# Patient Record
Sex: Female | Born: 1940 | Race: White | Hispanic: No | Smoking: Current every day smoker
Health system: Southern US, Community
[De-identification: ages and names within clinical notes are randomized; demographics above are authoritative.]

## PROBLEM LIST (undated history)

## (undated) DIAGNOSIS — C4490 Unspecified malignant neoplasm of skin, unspecified: Secondary | ICD-10-CM

## (undated) DIAGNOSIS — M1990 Unspecified osteoarthritis, unspecified site: Secondary | ICD-10-CM

## (undated) DIAGNOSIS — E119 Type 2 diabetes mellitus without complications: Secondary | ICD-10-CM

## (undated) DIAGNOSIS — F32A Depression, unspecified: Secondary | ICD-10-CM

## (undated) DIAGNOSIS — I2510 Atherosclerotic heart disease of native coronary artery without angina pectoris: Secondary | ICD-10-CM

## (undated) DIAGNOSIS — F329 Major depressive disorder, single episode, unspecified: Secondary | ICD-10-CM

## (undated) DIAGNOSIS — K635 Polyp of colon: Secondary | ICD-10-CM

## (undated) DIAGNOSIS — J189 Pneumonia, unspecified organism: Secondary | ICD-10-CM

## (undated) DIAGNOSIS — I639 Cerebral infarction, unspecified: Secondary | ICD-10-CM

## (undated) DIAGNOSIS — I10 Essential (primary) hypertension: Secondary | ICD-10-CM

## (undated) DIAGNOSIS — E785 Hyperlipidemia, unspecified: Secondary | ICD-10-CM

## (undated) HISTORY — DX: Hyperlipidemia, unspecified: E78.5

## (undated) HISTORY — PX: CORONARY ANGIOPLASTY WITH STENT PLACEMENT: SHX49

## (undated) HISTORY — PX: TONSILLECTOMY AND ADENOIDECTOMY: SUR1326

## (undated) HISTORY — PX: APPENDECTOMY: SHX54

## (undated) HISTORY — DX: Polyp of colon: K63.5

## (undated) HISTORY — DX: Depression, unspecified: F32.A

## (undated) HISTORY — DX: Unspecified osteoarthritis, unspecified site: M19.90

## (undated) HISTORY — PX: CATARACT EXTRACTION: SUR2

## (undated) HISTORY — DX: Major depressive disorder, single episode, unspecified: F32.9

## (undated) HISTORY — DX: Atherosclerotic heart disease of native coronary artery without angina pectoris: I25.10

---

## 1975-04-09 HISTORY — PX: TUBAL LIGATION: SHX77

## 1996-06-17 ENCOUNTER — Encounter: Payer: Self-pay | Admitting: Cardiovascular Disease

## 1997-11-16 ENCOUNTER — Inpatient Hospital Stay (HOSPITAL_COMMUNITY): Admission: EM | Admit: 1997-11-16 | Discharge: 1997-11-19 | Payer: Self-pay | Admitting: Emergency Medicine

## 1998-11-15 ENCOUNTER — Inpatient Hospital Stay (HOSPITAL_COMMUNITY): Admission: EM | Admit: 1998-11-15 | Discharge: 1998-11-16 | Payer: Self-pay | Admitting: Cardiovascular Disease

## 1998-11-15 ENCOUNTER — Encounter: Payer: Self-pay | Admitting: Cardiovascular Disease

## 1999-06-08 ENCOUNTER — Other Ambulatory Visit: Admission: RE | Admit: 1999-06-08 | Discharge: 1999-06-08 | Payer: Self-pay | Admitting: Obstetrics and Gynecology

## 2000-06-06 ENCOUNTER — Emergency Department (HOSPITAL_COMMUNITY): Admission: EM | Admit: 2000-06-06 | Discharge: 2000-06-07 | Payer: Self-pay | Admitting: Emergency Medicine

## 2000-06-07 ENCOUNTER — Encounter: Payer: Self-pay | Admitting: Emergency Medicine

## 2003-01-20 ENCOUNTER — Encounter: Payer: Self-pay | Admitting: Internal Medicine

## 2003-02-09 ENCOUNTER — Inpatient Hospital Stay (HOSPITAL_COMMUNITY): Admission: EM | Admit: 2003-02-09 | Discharge: 2003-02-11 | Payer: Self-pay | Admitting: Emergency Medicine

## 2003-08-02 ENCOUNTER — Emergency Department (HOSPITAL_COMMUNITY): Admission: EM | Admit: 2003-08-02 | Discharge: 2003-08-02 | Payer: Self-pay | Admitting: Emergency Medicine

## 2005-05-25 ENCOUNTER — Emergency Department (HOSPITAL_COMMUNITY): Admission: EM | Admit: 2005-05-25 | Discharge: 2005-05-25 | Payer: Self-pay | Admitting: Family Medicine

## 2006-09-24 ENCOUNTER — Ambulatory Visit: Payer: Self-pay | Admitting: Internal Medicine

## 2006-10-30 ENCOUNTER — Ambulatory Visit: Payer: Self-pay | Admitting: Internal Medicine

## 2006-11-06 ENCOUNTER — Ambulatory Visit: Payer: Self-pay | Admitting: Internal Medicine

## 2007-08-01 DIAGNOSIS — E785 Hyperlipidemia, unspecified: Secondary | ICD-10-CM

## 2007-08-01 DIAGNOSIS — D126 Benign neoplasm of colon, unspecified: Secondary | ICD-10-CM | POA: Insufficient documentation

## 2007-08-01 DIAGNOSIS — M1990 Unspecified osteoarthritis, unspecified site: Secondary | ICD-10-CM | POA: Insufficient documentation

## 2007-08-01 DIAGNOSIS — K219 Gastro-esophageal reflux disease without esophagitis: Secondary | ICD-10-CM

## 2007-08-01 DIAGNOSIS — F329 Major depressive disorder, single episode, unspecified: Secondary | ICD-10-CM

## 2007-08-01 DIAGNOSIS — I219 Acute myocardial infarction, unspecified: Secondary | ICD-10-CM | POA: Insufficient documentation

## 2007-08-01 DIAGNOSIS — E11649 Type 2 diabetes mellitus with hypoglycemia without coma: Secondary | ICD-10-CM

## 2008-01-23 ENCOUNTER — Emergency Department (HOSPITAL_COMMUNITY): Admission: EM | Admit: 2008-01-23 | Discharge: 2008-01-23 | Payer: Self-pay | Admitting: Emergency Medicine

## 2009-01-14 ENCOUNTER — Emergency Department (HOSPITAL_COMMUNITY): Admission: EM | Admit: 2009-01-14 | Discharge: 2009-01-14 | Payer: Self-pay | Admitting: Emergency Medicine

## 2009-01-28 ENCOUNTER — Emergency Department (HOSPITAL_COMMUNITY): Admission: EM | Admit: 2009-01-28 | Discharge: 2009-01-28 | Payer: Self-pay | Admitting: Family Medicine

## 2009-04-08 HISTORY — PX: FEMUR FRACTURE SURGERY: SHX633

## 2009-04-16 ENCOUNTER — Emergency Department (HOSPITAL_BASED_OUTPATIENT_CLINIC_OR_DEPARTMENT_OTHER): Admission: EM | Admit: 2009-04-16 | Discharge: 2009-04-16 | Payer: Self-pay | Admitting: Emergency Medicine

## 2009-10-21 ENCOUNTER — Emergency Department (HOSPITAL_COMMUNITY): Admission: EM | Admit: 2009-10-21 | Discharge: 2009-10-21 | Payer: Self-pay | Admitting: Emergency Medicine

## 2009-11-23 ENCOUNTER — Emergency Department (HOSPITAL_COMMUNITY): Admission: EM | Admit: 2009-11-23 | Discharge: 2009-11-23 | Payer: Self-pay | Admitting: Family Medicine

## 2009-12-03 ENCOUNTER — Inpatient Hospital Stay (HOSPITAL_COMMUNITY): Admission: EM | Admit: 2009-12-03 | Discharge: 2009-12-05 | Payer: Self-pay | Admitting: Emergency Medicine

## 2010-04-08 DIAGNOSIS — C4490 Unspecified malignant neoplasm of skin, unspecified: Secondary | ICD-10-CM

## 2010-04-08 HISTORY — DX: Unspecified malignant neoplasm of skin, unspecified: C44.90

## 2010-06-21 LAB — CBC
HCT: 34.8 % — ABNORMAL LOW (ref 36.0–46.0)
MCH: 30.4 pg (ref 26.0–34.0)
MCHC: 33.9 g/dL (ref 30.0–36.0)
MCV: 87.8 fL (ref 78.0–100.0)
Platelets: 210 10*3/uL (ref 150–400)
Platelets: 265 10*3/uL (ref 150–400)
RBC: 3.89 MIL/uL (ref 3.87–5.11)
RBC: 4.47 MIL/uL (ref 3.87–5.11)
RDW: 13.8 % (ref 11.5–15.5)
RDW: 13.9 % (ref 11.5–15.5)
WBC: 10.2 10*3/uL (ref 4.0–10.5)
WBC: 11.4 10*3/uL — ABNORMAL HIGH (ref 4.0–10.5)

## 2010-06-21 LAB — GLUCOSE, CAPILLARY
Glucose-Capillary: 141 mg/dL — ABNORMAL HIGH (ref 70–99)
Glucose-Capillary: 198 mg/dL — ABNORMAL HIGH (ref 70–99)
Glucose-Capillary: 208 mg/dL — ABNORMAL HIGH (ref 70–99)
Glucose-Capillary: 221 mg/dL — ABNORMAL HIGH (ref 70–99)

## 2010-06-21 LAB — URINE CULTURE: Culture: NO GROWTH

## 2010-06-21 LAB — BASIC METABOLIC PANEL
BUN: 6 mg/dL (ref 6–23)
CO2: 23 mEq/L (ref 19–32)
Calcium: 8.3 mg/dL — ABNORMAL LOW (ref 8.4–10.5)
Calcium: 8.6 mg/dL (ref 8.4–10.5)
Chloride: 102 mEq/L (ref 96–112)
Chloride: 106 mEq/L (ref 96–112)
Creatinine, Ser: 0.57 mg/dL (ref 0.4–1.2)
GFR calc Af Amer: >60 mL/min (ref >60)
GFR calc non Af Amer: >60 mL/min (ref >60)
Sodium: 137 mEq/L (ref 135–145)

## 2010-06-21 LAB — APTT: aPTT: 28 seconds (ref 24–37)

## 2010-06-21 LAB — URINALYSIS, ROUTINE W REFLEX MICROSCOPIC
Glucose, UA: NEGATIVE mg/dL
Nitrite: NEGATIVE
Specific Gravity, Urine: 1.01 (ref 1.005–1.030)
pH: 5.5 (ref 5.0–8.0)

## 2010-06-21 LAB — POCT I-STAT, CHEM 8
Creatinine, Ser: 0.8 mg/dL (ref 0.4–1.2)
HCT: 40 % (ref 36.0–46.0)
Hemoglobin: 13.6 g/dL (ref 12.0–15.0)
Potassium: 3.6 mEq/L (ref 3.5–5.1)

## 2010-06-21 LAB — CULTURE, ROUTINE-ABSCESS: Gram Stain: NONE SEEN

## 2010-06-21 LAB — ABO/RH: ABO/RH(D): O POS

## 2010-06-21 LAB — TYPE AND SCREEN

## 2010-06-23 LAB — URINALYSIS, ROUTINE W REFLEX MICROSCOPIC
Glucose, UA: NEGATIVE mg/dL
Hgb urine dipstick: NEGATIVE
Ketones, ur: NEGATIVE mg/dL
Protein, ur: NEGATIVE mg/dL

## 2010-06-23 LAB — COMPREHENSIVE METABOLIC PANEL
ALT: 15 U/L (ref 0–35)
AST: 17 U/L (ref 0–37)
CO2: 24 mEq/L (ref 19–32)
Chloride: 103 mEq/L (ref 96–112)
GFR calc Af Amer: 58 mL/min — ABNORMAL LOW (ref >60)
GFR calc non Af Amer: 48 mL/min — ABNORMAL LOW (ref >60)
Potassium: 3.9 mEq/L (ref 3.5–5.1)
Sodium: 134 mEq/L — ABNORMAL LOW (ref 135–145)
Total Bilirubin: 0.4 mg/dL (ref 0.3–1.2)

## 2010-06-23 LAB — URINE MICROSCOPIC-ADD ON

## 2010-06-23 LAB — DIFFERENTIAL
Basophils Absolute: 0.1 10*3/uL (ref 0.0–0.1)
Eosinophils Absolute: 0 10*3/uL (ref 0.0–0.7)
Eosinophils Relative: 1 % (ref 0–5)

## 2010-06-23 LAB — CBC
Hemoglobin: 12.8 g/dL (ref 12.0–15.0)
RBC: 4.02 MIL/uL (ref 3.87–5.11)

## 2010-06-23 LAB — POCT CARDIAC MARKERS

## 2010-08-21 NOTE — Assessment & Plan Note (Signed)
[facility]                         GASTROENTEROLOGY OFFICE NOTE   Katherine Cole, Katherine Cole                       MRN:          [id number]  DATE:09/24/2006                            DOB:          October 26, 1940    REFERRING PHYSICIAN:  Larina Earthly, M.D.   REASON FOR CONSULTATION:  Surveillance colonoscopy.   HISTORY:  This is a 69 year old female with multiple medical problems  including coronary artery disease status post myocardial infarction,  status post coronary artery stent placement on Plavix and aspirin,  diabetes mellitus, hyperlipidemia, osteoarthritis, gastroesophageal  reflux disease, depression, and history of adenomatous colon polyps.  She also has a family history of colon cancer in her brother diagnosed  at age 52.  The patient last underwent colonoscopy January 20, 2003.  She was found to have several colon polyps which were removed.  These  were both adenomatous and hyperplastic.  Her preparation was fair.  Followup in 3 years recommended.  From a GI standpoint, she is doing  well.  For her reflux disease, she is taking Nexium 40 mg daily.  No  heartburn or dysphagia.  She has been on Plavix for her coronary artery  stents which were placed remotely.  She has come off in the past for  procedures.  She denies abdominal pain, change in bowel habits, or  rectal bleeding.   ALLERGIES:  CODEINE.   CURRENT MEDICATIONS:  1. Aspirin 325 mg daily.  2. Toprol XL 50 mg daily.  3. Lipitor 80 mg daily.  4. Glucophage 1000 mg b.i.d.  5. Avandia 8 mg daily.  6. Plavix 75 mg daily.  7. Nexium 40 mg daily.   PAST MEDICAL HISTORY:  As above.   PAST SURGICAL HISTORY:  Appendectomy.   FAMILY HISTORY:  Brother with colon cancer at age 10; sister with colon  cancer at age 74.   SOCIAL HISTORY:  Married with 3 children, lives with her husband,  retired, smokes, does not use alcohol.   REVIEW OF SYSTEMS:  Per diagnostic evaluation.   PHYSICAL  EXAMINATION:  GENERAL:  Well-appearing female in no acute  distress.  She is alert and oriented.  VITAL SIGNS:  Blood pressure 122/72.  Heart rate is 64 and regular.  Weight is 111 pounds.  She is 5 feet 1 inch in height.  HEENT:  Sclerae anicteric.  Conjunctivae are pink.  Oral mucosa intact.  No adenopathy.  LUNGS:  Clear.  HEART:  Regular.  ABDOMEN:  Soft without tenderness, mass, or hernia.  EXTREMITIES:  Without edema.   IMPRESSION:  1. Personal history of adenomatous colon polyps due for surveillance.  2. Strong family history of colon cancer in two first degree      relatives.  3. Coronary artery disease on Plavix and aspirin.  4. Diabetes mellitus on oral agents.  5. Other general medical problems as described above.   RECOMMENDATIONS:  1. Colonoscopy with polypectomy if necessary.  The nature of the      procedure as well as the risks, benefits, and alternatives have      been reviewed.  She understood and  agreed to proceed.  2. Hold Plavix for 5 days prior to the exam but remain on aspirin.      The pros and cons of holding Plavix for a short period of time were      discussed with the patient.  3. Hold oral hypoglycemic agents the day of her exam.  4. Continue Nexium for reflux disease.     Wilhemina Bonito. Marina Goodell, MD  Electronically Signed    JNP/MedQ  DD: 09/24/2006  DT: 09/25/2006  Job #: [id number]   cc:   Larina Earthly, M.D.

## 2010-08-24 NOTE — Discharge Summary (Signed)
NAME:  Katherine Cole, Katherine Cole                          ACCOUNT NO.:  [id number]   MEDICAL RECORD NO.:  [id number]                   PATIENT TYPE:  INP   LOCATION:  6522                                 FACILITY:  [facility]   PHYSICIAN:  Lyn Records, M.D.                DATE OF BIRTH:  [birth year]   DATE OF ADMISSION:  02/09/2003  DATE OF DISCHARGE:  02/11/2003                                 DISCHARGE SUMMARY   ADMISSION DIAGNOSES:  1. Chest pain, rule out myocardial infarction.  2. Hypertension.  3. Hyperlipidemia.  4. Non-insulin dependent diabetes.  5. Gastroesophageal reflux disease.   DISCHARGE DIAGNOSES:  1. Chest pain, negative for myocardial infarction.  2. Coronary artery disease status post PCI and Cypher stent placement to the     circumflex with additional mild to moderate left anterior descending     disease and a totally occluded right coronary artery.  3. Hypertension, controlled.  4. Hyperlipidemia, uncontrolled.  5. Gastroesophageal reflux disease.  6. Non-insulin dependent diabetes, stable.   PROCEDURES:  Cardiac catheterization on February 10, 2003.   COMPLICATIONS:  None.   DISCHARGE STATUS:  Stable.  Improved.   HISTORY OF PRESENT ILLNESS:  Please see complete H&P for details but in  short this is a 70 year old female with known CAD.  She is status post MI  from 1997 with stent placement to coronary artery.  She was admitted on  February 09, 2003, after complaining of chest discomfort.   PHYSICAL EXAMINATION ON ADMISSION:  Please see complete H&P.  The physical  exam essentially benign.   Admission EKG showed a sinus bradycardia with a rate of 59.  T-wave  inversion in aVL only.  No ischemic changes noted.   Initial cardiac enzymes/cardiac markers in the emergency room were negative  x3 sets.  The rest of her admission labs including CBC, CMP, and coag  studies were normal.   HOSPITAL COURSE:  The patient was admitted to rule out MI with serial  cardiac  enzymes.  She was placed on nitroglycerin paste topically and  started on subcu. Lovenox, enteric-coated aspirin.  CT chest and abdomen  were also ordered to rule out PE and aortic dissection.   On day two of admission, her discomfort had completely resolved with no  recurrence.  Serial cardiac enzymes were all negative.  Repeat EKGs were  normal without ischemic changes.  CT scans were negative for PE and aortic  dissection.  She was planned for a cardiac catheterization to redefine  coronary anatomy.   On February 10, 2003, she was taken to the Cardiac Cath Lab by Dr. Verdis Prime.  Results showed normal LV function.  Left main was okay.  LAD had  diffuse luminal disease.  Patent stent.  Circumflex had an 85-90% lesion in  the proximal to mid section.  RCA was 100% occluded distally.  There was  also significant  diffuse disease proximally and in the mid section.   She underwent successful PCI and Cypher stent placement to the circumflex.  She was started on Plavix and Angiomax.   Cardiac enzymes post-procedure were all normal.  Lipid panel showed a total  of 209, triglycerides 193, HDL 37, LDL 33.  She was already on 40 mg of  Lipitor as an outpatient.   Groin sheath was pulled without complications.   On February 11, 2003, she still was without complaints of chest discomfort.  Her exam was benign.  Vital signs were stable.  Right groin cath site was  without bruising, hematoma or bruit.  She was discharged home without  further incident.   DISCHARGE MEDICATIONS:  1. Glucophage 1000 mg b.i.d.  She was instructed not to resume this until     Sunday, February 13, 2003.  2. Glipizide 5 mg daily.  3. Toprol XL 50 mg daily.  4. Lipitor 80 mg daily.  5. Plavix 75 mg daily.  6. Prilosec OTC 20 mg daily.   Prescriptions for Plavix and new dose of Lipitor were both written.   She is not to drive or have sexual activity today.  She is not to undergo  any strenuous activity, ie. lifting  more than five pounds, bending, stooping  or straining for the next two days.  She may resume her normal activities  after that.   She is to maintain a low-salt, low-fat, low-cholesterol diet as well as her  diabetic diet restrictions.   She may shower and gently wash her right groin cath site with warm soap and  water.  She was instructed not to soak in a bathtub for the next two days.   If she has any pain, swelling or bruising, or any problems, she is to  contact Dr. Michaelle Copas office.   She has a follow up appointment with Dr. Katrinka Blazing on Friday, February 25, 2003,  at 11 a.m.   Social Worker will be assisting her in obtaining her medications, in  particular her Plavix.      Adrian Saran, N.P.                        Lyn Records, M.D.    HB/MEDQ  D:  02/11/2003  T:  02/12/2003  Job:  [id number]

## 2010-08-24 NOTE — H&P (Signed)
NAME:  [name], [name]                          ACCOUNT NO.:  [id number]   MEDICAL RECORD NO.:  [id number]                   PATIENT TYPE:  OBV   LOCATION:  1831                                 FACILITY:  [facility]   PHYSICIAN:  Quita Skye. Waldon Reining, MD             DATE OF BIRTH:  December 21, 1940   DATE OF ADMISSION:  02/09/2003  DATE OF DISCHARGE:                                HISTORY & PHYSICAL   HISTORY OF PRESENT ILLNESS:  Katherine Cole is a 70 year old white woman who  is admitted to [facility] for further evaluation of chest pain.  The patient has a history of coronary artery disease, which dates back to  2.  The details of her interventions are not known as her chart is not  yet available.  I any case she reports that a stent was placed at that time.  Soon thereafter she returned for an additional stent.  Her most recent  cardiac catheterization was in 1999.  She is unaware of those results.  Her  course has been uncomplicated since then.   The patient presented to the emergency department after experiencing chest  pain, which began while she was driving this afternoon.  The chest pain is  described as a lower substernal pressure and it radiated to left shoulder.  It was associated with dyspnea, diaphoresis and nausea.  It appeared not to  be related to position, activity or meals.  It seemed to worsen somewhat  with deep inspiration.  The pain continued throughout the afternoon and  ultimately prompted her to summon EMS.  She took two nitroglycerin tablets  before EMS arrived and additional nitroglycerin was administered by EMS.  This caused the discomfort to improve somewhat, but it did not completely  resolve.  Her chest discomfort is largely resolved at this time.  It is now  10 hours since heart rate chest discomfort began.  There are no other  exacerbating or ameliorating factors.   The patient has no history of congestive heart failure or arrhythmia.  The  patient  has a history of hypertension,  hyperlipidemia and noninsulin-  dependent diabetes mellitus.  The patient continues to smoke one pack of  cigarettes per day.  There is a strong family history of coronary artery  disease; her mother suffered from coronary artery disease in her 41s.   The patient's only other medical problems are hiatal hernia and  gastroesophageal reflux.  Two weeks ago she states she underwent an  esophageal dilatation.   PAST SURGICAL HISTORY:  Previous operations include cholecystectomy and  tonsillectomy.   ACCIDENTS AND INJURIES:  Significant injuries; none.   ALLERGIES:  The patient reports that she is allergic to MORPHINE and  CODEINE.   MEDICATIONS:  Current medications (doses unknown) include Prevacid, Lipitor,  Toprol XL, Glucophage, glipizide, and aspirin.   SOCIAL HISTORY:  The patient does not work.  She lives with her  husband.   REVIEW OF SYSTEMS:  Review of systems reveals no new problems related to her  head, eyes, ears, nose, mouth, throat, lungs, gastrointestinal system,  genitourinary system, or extremities.  There is no history of neurologic or  psychiatric disorder.  There is no history of fever, chills or weight loss.   PHYSICAL EXAMINATION:  VITAL SIGNS:  Blood pressure 119/64, pulse 57 and  regular, respirations 28 and temperature 98.4.  GENERAL APPEARANCE:  The patient is n older white woman in no discomfort.  She is alert, oriented appropriate and responsive.  HEENT:  Head, eyes, nose and mouth are normal.  NECK:  The neck is without thyromegaly or adenopathy.  Carotid pulses are  palpable bilaterally and without bruits.  HEART:  Cardiac examination reveals a normal S1 and S2.  There is no S3, S4,  murmur, rub, or click.  Cardiac rhythm is negative.  CHEST:  No chest wall tenderness is noted.  LUNGS:  The lungs are clear.  ABDOMEN:  The abdomen is soft and nontender.  There is no mass,  hepatosplenomegaly, bruit, distention, rebound, or  rigidity.  Bowel sounds  are normal.  BREASTS, PELVIC AND RECTAL:  Breast, pelvic and rectal examinations are not  performed as they are not pertinent to the reason for acute care  hospitalization.  EXTREMITIES:  The extremities are without edema, deviation or deformity.  Radial and dorsalis pedis pulses are palpable bilaterally.  NEUROLOGIC:  Brief screening neurological examination demonstrates no focal  neurologic findings.   LABORATORY DATA:  The electrocardiogram is normal.  The chest radiograph  report is pending at the time of this dictation.  Cardiac markers reveal a  CK MB of less than 1.0, myoglobin 27.0 and troponin less than 0.05.  The  second set reveals a CK MB of 1.5, myoglobin 34.1 and troponin less than  0.05.  The third set revealed a CK MB of less than 1.0 with a myoglobin of  36.5 and troponin of less than 0.05.  Potassium 3.7, BUN 13 and creatinine  0.8.  The remaining studies are pending at the time of this dictation.   IMPRESSION:  1. Chest pain rule out cardiac ischemia.  2. Coronary artery disease status post myocardial infarction in 1997     resulting in a stent.  An additional stent was placed even thereafter.     The most recent cardiac catheterization was in 1999; the hospital records     are not yet available to detail these events.  3. Hypertension.  4. Hyperlipidemia.  5. Noninsulin-dependent diabetes mellitus.  6. Gastroesophageal reflux and hiatal hernia; two weeks status post     esophageal dilatation.   PLAN:  1. Telemetry.  2. Serial cardiac enzymes.  3. Aspirin.  4. Lovenox.  5. Nitrol Paste.  6. Discontinue smoking.  7. Further evaluation per Dr. Katrinka Blazing.  8. Chest and abdominal CT scan to rule out aortic dissection and pulmonary     embolus.                                                Quita Skye. Waldon Reining, MD    MSC/MEDQ  D:  02/09/2003  T:  02/10/2003  Job:  [id number]

## 2010-12-12 ENCOUNTER — Encounter: Payer: Self-pay | Admitting: Internal Medicine

## 2011-04-09 HISTORY — PX: SKIN CANCER EXCISION: SHX779

## 2011-05-28 ENCOUNTER — Encounter: Payer: Self-pay | Admitting: Internal Medicine

## 2011-06-26 ENCOUNTER — Other Ambulatory Visit: Payer: Self-pay | Admitting: Dermatology

## 2011-07-01 ENCOUNTER — Ambulatory Visit (INDEPENDENT_AMBULATORY_CARE_PROVIDER_SITE_OTHER): Payer: Medicare Other | Admitting: Internal Medicine

## 2011-07-01 ENCOUNTER — Encounter: Payer: Self-pay | Admitting: Internal Medicine

## 2011-07-01 ENCOUNTER — Telehealth: Payer: Self-pay

## 2011-07-01 VITALS — BP 134/62 | HR 72 | Ht 61.0 in | Wt 108.4 lb

## 2011-07-01 DIAGNOSIS — Z86010 Personal history of colonic polyps: Secondary | ICD-10-CM

## 2011-07-01 DIAGNOSIS — K219 Gastro-esophageal reflux disease without esophagitis: Secondary | ICD-10-CM

## 2011-07-01 DIAGNOSIS — I2510 Atherosclerotic heart disease of native coronary artery without angina pectoris: Secondary | ICD-10-CM

## 2011-07-01 DIAGNOSIS — Z800 Family history of malignant neoplasm of digestive organs: Secondary | ICD-10-CM

## 2011-07-01 DIAGNOSIS — E119 Type 2 diabetes mellitus without complications: Secondary | ICD-10-CM

## 2011-07-01 MED ORDER — PEG-KCL-NACL-NASULF-NA ASC-C 100 G PO SOLR: 1.0000 | Freq: Once | ORAL | Status: DC

## 2011-07-01 NOTE — Telephone Encounter (Signed)
Reminded pt to hold her diabetic medication the morning of the procedure and continue taking her aspirin daily

## 2011-07-01 NOTE — Progress Notes (Signed)
HISTORY OF PRESENT ILLNESS:  Katherine Cole is a 71 y.o. female with hypertension, hyperlipidemia, diabetes mellitus, coronary artery disease with remote coronary artery stent placement for which he is on Plavix, adenomatous colon polyps, and a family history of colon cancer. She presents today regarding surveillance colonoscopy. She has a history of adenomatous polyps on index colonoscopy in 2004. Brother with colon cancer at age 58. Last colonoscopy July 2008 with suboptimal preparation. Due for followup. GI review of systems is negative. She denies any interval cardiac problems. She is in between cardiologist. We did hold her Plavix prior to her last exam. She takes aspirin inconsistently. She takes Nexium on demand for heartburn. No dysphagia.  REVIEW OF SYSTEMS:  All non-GI ROS negative except for arthritis  Past Medical History  Diagnosis Date  . Colon polyps     adenomatous  . GERD (gastroesophageal reflux disease)   . CAD (coronary artery disease)   . MI (myocardial infarction)   . Diabetes mellitus   . Hyperlipemia   . Osteoarthritis   . Depression     Past Surgical History  Procedure Date  . Tonsillectomy   . Appendectomy   . Coronary artery bypass graft     x3    Social History Katherine Cole  reports that she has been smoking Cigarettes.  She has never used smokeless tobacco. She reports that she does not drink alcohol or use illicit drugs.  family history includes Colon cancer (age of onset:66) in her brother; Diabetes in her daughter; Lung cancer in her brother and sister; and Stomach cancer in her brother.  Allergies  Allergen Reactions  . Morphine And Related Other (See Comments)    headaches       PHYSICAL EXAMINATION: Vital signs: BP 134/62  Pulse 72  Ht 5\' 1"  (1.549 m)  Wt 108 lb 6.4 oz (49.17 kg)  BMI 20.48 kg/m2  Constitutional: generally well-appearing, no acute distress Psychiatric: alert and oriented x3, cooperative Eyes: extraocular movements  intact, anicteric, conjunctiva pink Mouth: oral pharynx moist, no lesions Neck: supple no lymphadenopathy Cardiovascular: heart regular rate and rhythm, no murmur Lungs: clear to auscultation bilaterally Abdomen: soft, nontender, nondistended, no obvious ascites, no peritoneal signs, normal bowel sounds, no organomegaly Rectal:deferred until colonoscopy Extremities: no lower extremity edema bilaterally Skin: no lesions on visible extremities. Bandages from skin cancer removal Neuro: No focal deficits.   ASSESSMENT:  #1. Personal history of adenomatous polyps #2. Family history of colon cancer in a first-degree relative #3. Coronary artery disease with prior coronary artery stents remotely. On Plavix #4. Diabetes mellitus #5. GERD  PLAN:  #1. Surveillance colonoscopy. Patient is high-risk given her comorbidities and need to address antiplatelet therapy.The nature of the procedure, as well as the risks, benefits, and alternatives were carefully and thoroughly reviewed with the patient. Ample time for discussion and questions allowed. The patient understood, was satisfied, and agreed to proceed. We will hold Plavix 5 days prior to the procedure. She will stay on aspirin daily. Movi prep prescribed. Importance of adequate preparation stressed. She was instructed on the use of the prep #2. Hold diabetic medications the day of the exam #3. PPI on demand to control GERD symptoms

## 2011-07-01 NOTE — Patient Instructions (Addendum)
You have been given a separate informational sheet regarding your tobacco use, the importance of quitting and local resources to help you quit.  You have been scheduled for a colonoscopy with propofol. Please follow written instructions given to you at your visit today.  Please pick up your prep kit at the pharmacy within the next 1-3 days.

## 2011-07-30 ENCOUNTER — Ambulatory Visit (AMBULATORY_SURGERY_CENTER): Payer: Medicare Other | Admitting: Internal Medicine

## 2011-07-30 ENCOUNTER — Encounter: Payer: Self-pay | Admitting: Internal Medicine

## 2011-07-30 VITALS — BP 118/53 | HR 61 | Temp 96.1°F | Resp 17 | Ht 61.0 in | Wt 108.0 lb

## 2011-07-30 DIAGNOSIS — Z800 Family history of malignant neoplasm of digestive organs: Secondary | ICD-10-CM

## 2011-07-30 DIAGNOSIS — Z86010 Personal history of colonic polyps: Secondary | ICD-10-CM

## 2011-07-30 DIAGNOSIS — Z1211 Encounter for screening for malignant neoplasm of colon: Secondary | ICD-10-CM

## 2011-07-30 LAB — GLUCOSE, CAPILLARY
Glucose-Capillary: 130 mg/dL — ABNORMAL HIGH (ref 70–99)
Glucose-Capillary: 162 mg/dL — ABNORMAL HIGH (ref 70–99)

## 2011-07-30 MED ORDER — SODIUM CHLORIDE 0.9 % IV SOLN: 500.0000 mL | INTRAVENOUS | Status: DC

## 2011-07-30 NOTE — Op Note (Signed)
[facility] [address] [place], [place]  [address]  COLONOSCOPY PROCEDURE REPORT  PATIENT:  Katherine Cole, Katherine Cole  MR#:  [id number] BIRTHDATE:  06-25-1940, 70 yrs. old  GENDER:  female ENDOSCOPIST:  Wilhemina Bonito. Eda Keys, MD REF. BY:  Surveillance Program Recall, PROCEDURE DATE:  07/30/2011 PROCEDURE:  Surveillance Colonoscopy ASA CLASS:  Class II INDICATIONS:  history of pre-cancerous (adenomatous) colon polyps, family history of colon cancer, surveillance and high-risk screening ;idxex 2004 w/ TA; f/u 2008 (sibling 30) MEDICATIONS:   MAC sedation, administered by CRNA, propofol (Diprivan) 120 mg IV  DESCRIPTION OF PROCEDURE:   After the risks benefits and alternatives of the procedure were thoroughly explained, informed consent was obtained.  Digital rectal exam was performed and revealed no abnormalities.   The LB CF-[id number] endoscope was introduced through the anus and advanced to the cecum, which was identified by both the appendix and ileocecal valve, without limitations.  The quality of the prep was excellent, using MoviPrep.  The instrument was then slowly withdrawn as the colon was fully examined. <<PROCEDUREIMAGES>>  FINDINGS:  A normal appearing cecum, ileocecal valve, and appendiceal orifice were identified. The ascending, hepatic flexure, transverse, splenic flexure, descending, sigmoid colon, and rectum appeared unremarkable.  No polyps or cancers were seen. Retroflexed views in the rectum revealed no abnormalities.    The time to cecum =  3:33  minutes. The scope was then withdrawn in 7:20  minutes from the cecum and the procedure completed.  COMPLICATIONS:  None  ENDOSCOPIC IMPRESSION: 1) Normal colon 2) No polyps or cancers  RECOMMENDATIONS: 1) Follow up colonoscopy in 5 years  ______________________________ Wilhemina Bonito. Eda Keys, MD  CC:  Chilton Greathouse, MD;  The Patient  n. eSIGNED:   Wilhemina Bonito. Eda Keys at 07/30/2011 08:44 AM  Izora Ribas,  [phone]

## 2011-07-30 NOTE — Progress Notes (Signed)
Patient did not experience any of the following events: a burn prior to discharge; a fall within the facility; wrong site/side/patient/procedure/implant event; or a hospital transfer or hospital admission upon discharge from the facility. (G8907) Patient did not have preoperative order for IV antibiotic SSI prophylaxis. (G8918)  

## 2011-07-30 NOTE — Patient Instructions (Signed)

## 2011-07-31 ENCOUNTER — Telehealth: Payer: Self-pay | Admitting: *Deleted

## 2011-07-31 NOTE — Telephone Encounter (Signed)
  Follow up Call-  Call back number 07/30/2011  Post procedure Call Back phone  # [phone]  Permission to leave phone message Yes     Patient questions:  Do you have a fever, pain , or abdominal swelling? no Pain Score  0 *  Have you tolerated food without any problems? yes  Have you been able to return to your normal activities? yes  Do you have any questions about your discharge instructions: Diet   no Medications  no Follow up visit  no  Do you have questions or concerns about your Care? no  Actions: * If pain score is 4 or above: No action needed, pain <4.

## 2012-02-10 ENCOUNTER — Encounter (HOSPITAL_COMMUNITY): Payer: Self-pay

## 2012-02-10 ENCOUNTER — Emergency Department (HOSPITAL_COMMUNITY): Payer: Medicare Other

## 2012-02-10 ENCOUNTER — Observation Stay (HOSPITAL_COMMUNITY)
Admission: EM | Admit: 2012-02-10 | Discharge: 2012-02-11 | Disposition: A | Discharge status: Home / Self Care | Payer: Medicare Other | Attending: Cardiology | Admitting: Cardiology

## 2012-02-10 DIAGNOSIS — Z9861 Coronary angioplasty status: Secondary | ICD-10-CM | POA: Insufficient documentation

## 2012-02-10 DIAGNOSIS — M1990 Unspecified osteoarthritis, unspecified site: Secondary | ICD-10-CM | POA: Insufficient documentation

## 2012-02-10 DIAGNOSIS — F17200 Nicotine dependence, unspecified, uncomplicated: Secondary | ICD-10-CM | POA: Insufficient documentation

## 2012-02-10 DIAGNOSIS — E785 Hyperlipidemia, unspecified: Secondary | ICD-10-CM | POA: Insufficient documentation

## 2012-02-10 DIAGNOSIS — I2510 Atherosclerotic heart disease of native coronary artery without angina pectoris: Secondary | ICD-10-CM

## 2012-02-10 DIAGNOSIS — R0789 Other chest pain: Principal | ICD-10-CM | POA: Insufficient documentation

## 2012-02-10 DIAGNOSIS — E119 Type 2 diabetes mellitus without complications: Secondary | ICD-10-CM | POA: Insufficient documentation

## 2012-02-10 DIAGNOSIS — Z860100 Personal history of colon polyps, unspecified: Secondary | ICD-10-CM | POA: Insufficient documentation

## 2012-02-10 DIAGNOSIS — F17210 Nicotine dependence, cigarettes, uncomplicated: Secondary | ICD-10-CM

## 2012-02-10 DIAGNOSIS — K219 Gastro-esophageal reflux disease without esophagitis: Secondary | ICD-10-CM | POA: Insufficient documentation

## 2012-02-10 DIAGNOSIS — E11649 Type 2 diabetes mellitus with hypoglycemia without coma: Secondary | ICD-10-CM | POA: Diagnosis present

## 2012-02-10 DIAGNOSIS — I10 Essential (primary) hypertension: Secondary | ICD-10-CM

## 2012-02-10 DIAGNOSIS — R079 Chest pain, unspecified: Secondary | ICD-10-CM

## 2012-02-10 DIAGNOSIS — I252 Old myocardial infarction: Secondary | ICD-10-CM | POA: Insufficient documentation

## 2012-02-10 HISTORY — DX: Essential (primary) hypertension: I10

## 2012-02-10 HISTORY — DX: Type 2 diabetes mellitus without complications: E11.9

## 2012-02-10 LAB — CBC WITH DIFFERENTIAL/PLATELET
Basophils Absolute: 0 10*3/uL (ref 0.0–0.1)
Basophils Relative: 0 % (ref 0–1)
Eosinophils Absolute: 0.1 10*3/uL (ref 0.0–0.7)
MCH: 30.5 pg (ref 26.0–34.0)
MCHC: 34 g/dL (ref 30.0–36.0)
Neutrophils Relative %: 50 % (ref 43–77)
Platelets: 288 10*3/uL (ref 150–400)
RBC: 4.07 MIL/uL (ref 3.87–5.11)

## 2012-02-10 LAB — COMPREHENSIVE METABOLIC PANEL
Albumin: 3.5 g/dL (ref 3.5–5.2)
BUN: 23 mg/dL (ref 6–23)
Chloride: 103 mEq/L (ref 96–112)
Creatinine, Ser: 1.1 mg/dL (ref 0.50–1.10)
GFR calc non Af Amer: 50 mL/min — ABNORMAL LOW (ref >90)
Total Bilirubin: 0.3 mg/dL (ref 0.3–1.2)

## 2012-02-10 LAB — POCT I-STAT TROPONIN I: Troponin i, poc: 0.01 ng/mL (ref 0.00–0.08)

## 2012-02-10 LAB — MAGNESIUM: Magnesium: 1.8 mg/dL (ref 1.5–2.5)

## 2012-02-10 LAB — TROPONIN I: Troponin I: <0.3 ng/mL (ref <0.30)

## 2012-02-10 MED ORDER — GLIMEPIRIDE 1 MG PO TABS
1.0000 mg | ORAL_TABLET | Freq: Every day | ORAL | Status: DC
  Administered 2012-02-11: 1 mg via ORAL
  Filled 2012-02-10 (×2): qty 1

## 2012-02-10 MED ORDER — ACETAMINOPHEN 325 MG PO TABS: 650.0000 mg | ORAL_TABLET | ORAL | Status: DC | PRN

## 2012-02-10 MED ORDER — ALPRAZOLAM 0.25 MG PO TABS: 0.2500 mg | ORAL_TABLET | Freq: Two times a day (BID) | ORAL | Status: DC | PRN

## 2012-02-10 MED ORDER — ONDANSETRON HCL 4 MG/2ML IJ SOLN: 4.0000 mg | Freq: Four times a day (QID) | INTRAMUSCULAR | Status: DC | PRN

## 2012-02-10 MED ORDER — ACETAMINOPHEN 325 MG PO TABS
650.0000 mg | ORAL_TABLET | Freq: Once | ORAL | Status: AC
  Administered 2012-02-10: 650 mg via ORAL
  Filled 2012-02-10: qty 2

## 2012-02-10 MED ORDER — ATORVASTATIN CALCIUM 80 MG PO TABS
80.0000 mg | ORAL_TABLET | Freq: Every day | ORAL | Status: DC
  Administered 2012-02-11: 80 mg via ORAL
  Filled 2012-02-10: qty 1

## 2012-02-10 MED ORDER — ZOLPIDEM TARTRATE 5 MG PO TABS: 5.0000 mg | ORAL_TABLET | Freq: Every evening | ORAL | Status: DC | PRN

## 2012-02-10 MED ORDER — LOSARTAN POTASSIUM 50 MG PO TABS
100.0000 mg | ORAL_TABLET | Freq: Every day | ORAL | Status: DC
  Administered 2012-02-11: 100 mg via ORAL
  Filled 2012-02-10: qty 2

## 2012-02-10 MED ORDER — NITROGLYCERIN 0.4 MG SL SUBL: 0.4000 mg | SUBLINGUAL_TABLET | SUBLINGUAL | Status: DC | PRN

## 2012-02-10 MED ORDER — PANTOPRAZOLE SODIUM 40 MG PO TBEC: 80.0000 mg | DELAYED_RELEASE_TABLET | Freq: Every day | ORAL | Status: DC

## 2012-02-10 MED ORDER — NICOTINE 14 MG/24HR TD PT24
14.0000 mg | MEDICATED_PATCH | Freq: Every day | TRANSDERMAL | Status: DC
  Administered 2012-02-10 – 2012-02-11 (×2): 14 mg via TRANSDERMAL
  Filled 2012-02-10 (×2): qty 1

## 2012-02-10 MED ORDER — ASPIRIN EC 81 MG PO TBEC
81.0000 mg | DELAYED_RELEASE_TABLET | Freq: Every day | ORAL | Status: DC
  Administered 2012-02-11: 81 mg via ORAL
  Filled 2012-02-10: qty 1

## 2012-02-10 MED ORDER — INSULIN ASPART 100 UNIT/ML ~~LOC~~ SOLN
0.0000 [IU] | Freq: Three times a day (TID) | SUBCUTANEOUS | Status: DC
  Administered 2012-02-11: 2 [IU] via SUBCUTANEOUS

## 2012-02-10 MED ORDER — METOPROLOL SUCCINATE ER 50 MG PO TB24
50.0000 mg | ORAL_TABLET | Freq: Every day | ORAL | Status: DC
  Administered 2012-02-11: 50 mg via ORAL
  Filled 2012-02-10: qty 1

## 2012-02-10 NOTE — ED Notes (Signed)
Pt complaining of left CP into shoulder, has had MI in past.  Pain is different.  Pain for 2 days, began again this morning.  Pt having nausea.  Pt took nitro at home pain from 8 to 4.  EMS gave pt nitro x2 and ASA 324mg .  Pt now having 1/10 CP.  Pt had EKG with EMS.  NSR.

## 2012-02-10 NOTE — ED Notes (Addendum)
Pt denies chest pain, dizziness, and nausea currently. Pt mentating appropriately. Pt being prepared to be taken to floor at this time.

## 2012-02-10 NOTE — ED Notes (Addendum)
Report given to floor nurse, Reden, RN. Nurse has no further questions upon report given. Pt being prepared for transport.

## 2012-02-10 NOTE — ED Provider Notes (Signed)
I reviewed and interpreted the EKG during the patient's evaluation in the ED and agree with the resident's interpretation. I saw and evaluated the patient, reviewed the resident's note and I agree with the findings and plan.  Patient presents with chest pain and known history of coronary artery disease. She has not seen a cardiologist for at least a year. Her symptoms are concerning for the possibility of acute coronary syndrome. She will be admitted for further treatment and evaluation to the hospital  Celene Kras, MD 02/10/12 [phone]

## 2012-02-10 NOTE — ED Notes (Signed)
Pt states chest pain 2/10. Pt taken 3 Nitroglycerin total. Pt c/o pain 10/10 in head from Nitro.

## 2012-02-10 NOTE — ED Notes (Signed)
Meal tray ordered 

## 2012-02-10 NOTE — ED Notes (Addendum)
Cardiology MD at bedside.

## 2012-02-10 NOTE — ED Notes (Signed)
Pt denies chest pain currently; pt denies dizziness and nausea; pt denies shortness of breath. Pt c/o headache 6/10 pain, pt states would not like anything for pain states "I will wait a little while."

## 2012-02-10 NOTE — ED Notes (Signed)
Dr. Demetrius Charity Hibma notified of pts BP and MAP.

## 2012-02-10 NOTE — H&P (Signed)
Admission Note   Patient ID: JAQUANA GEIGER MRN: [id number], DOB/AGE: 09-14-1940   Admit date: 02/10/2012 Date of Consult: 02/10/2012  Primary Physician: Hoyle Sauer, MD Primary Cardiologist: Previously Dr. Deborah Chalk  Reason for consult: evaluation/management of chest pain  HPI: Ms. Lueth is a 71yo female w/ PMHx s/f CAD (s/p PCI x 3 stents), DM, HL, HTN, GERD, tobacco abuse who presents to [facility] ED with chest pain.   She last seen by Dr. Deborah Chalk < 2 years. Nuclear stress test at that time was "normal." No limitation in functional status. Able to go on long walks w/o experiencing chest pain, SOB or lightheadedness. She reports sharp, shooting L-sided chest pain beginning last night and into this morning radiating to her left arm lasting 1-2 minutes w/ associated SOB while watching television. No diaphoresis, nausea, PND, LEE, orthopnea, lightheadedness or syncope. Not reminiscent of previous angina. Relieved by NTG SL. No aggravation w/position changes or inspiration. No new cough, fevers, chills, no unilateral leg swelling, redness or tenderness. Reports 1 PPD. No alcohol. No elicit drugs.   In the ED, EKG reveals no evidence of ischemia. Initial trop-I WNL. CBC & BMET WNL. CXR nonacute. VSS.   Problem List: Past Medical History  Diagnosis Date  . Colon polyps     adenomatous  . GERD (gastroesophageal reflux disease)   . CAD (coronary artery disease)   . MI (myocardial infarction)   . Diabetes mellitus   . Hyperlipemia   . Osteoarthritis   . Depression     Past Surgical History  Procedure Date  . Tonsillectomy   . Appendectomy   . Coronary stent placement 1999, 2001    3 stents     Allergies:  Allergies  Allergen Reactions  . Codeine Other (See Comments)    Head ache   . Morphine And Related Other (See Comments)    headaches    Home Medications: Prior to Admission medications   Medication Sig Start Date End Date Taking? Authorizing Provider  acetaminophen  (TYLENOL) 325 MG tablet Take 650 mg by mouth every 6 (six) hours as needed. For pain   Yes Historical Provider, MD  aspirin 325 MG EC tablet Take 325 mg by mouth daily.   Yes Historical Provider, MD  atorvastatin (LIPITOR) 80 MG tablet Take 80 mg by mouth daily.   Yes Historical Provider, MD  clopidogrel (PLAVIX) 75 MG tablet Take 75 mg by mouth daily.   Yes Historical Provider, MD  esomeprazole (NEXIUM) 40 MG capsule Take 40 mg by mouth daily as needed. For acid reflux   Yes Historical Provider, MD  glimepiride (AMARYL) 1 MG tablet Take 1 mg by mouth daily before breakfast.   Yes Historical Provider, MD  ibuprofen (ADVIL,MOTRIN) 200 MG tablet Take 400 mg by mouth every 6 (six) hours as needed. For pain   Yes Historical Provider, MD  losartan (COZAAR) 100 MG tablet Take 100 mg by mouth daily.   Yes Historical Provider, MD  meloxicam (MOBIC) 15 MG tablet Take 15 mg by mouth daily.   Yes Historical Provider, MD  metFORMIN (GLUCOPHAGE) 500 MG tablet Take 1,000 mg by mouth 2 (two) times daily with a meal.   Yes Historical Provider, MD  metoprolol succinate (TOPROL-XL) 50 MG 24 hr tablet Take 50 mg by mouth daily. Take with or immediately following a meal.   Yes Historical Provider, MD  nitroGLYCERIN (NITROSTAT) 0.4 MG SL tablet Place 0.4 mg under the tongue every 5 (five) minutes as needed.   Yes  Historical Provider, MD    Inpatient Medications:     . [COMPLETED] acetaminophen  650 mg Oral Once    (Not in a hospital admission)  Family History  Problem Relation Age of Onset  . Colon cancer Brother 40  . Lung cancer Sister   . Lung cancer Brother   . Stomach cancer Brother   . Diabetes Daughter      History   Social History  . Marital Status: Married    Spouse Name: N/A    Number of Children: 3  . Years of Education: N/A   Occupational History  . Retired    Social History Main Topics  . Smoking status: Current Every Day Smoker -- 0.5 packs/day    Types: Cigarettes  . Smokeless  tobacco: Never Used  . Alcohol Use: No  . Drug Use: No  . Sexually Active: Not on file   Other Topics Concern  . Not on file   Social History Narrative  . No narrative on file     Review of Systems: General: negative for chills, fever, night sweats or weight changes.  Cardiovascular: positive for chest pain and SOB, negative for dyspnea on exertion, edema, orthopnea, palpitations, paroxysmal nocturnal dyspnea  Dermatological: negative for rash Respiratory: negative for cough or wheezing Urologic:  negative for hematuria Abdominal: negative for nausea, vomiting, diarrhea, bright red blood per rectum, melena, or hematemesis Neurologic: negative for visual changes, syncope, or dizziness All other systems reviewed and are otherwise negative except as noted above.  Physical Exam: Blood pressure 112/51, pulse 70, temperature 98.8 F (37.1 C), temperature source Oral, resp. rate 20, SpO2 97.00%.    General: Well developed, well nourished, in no acute distress. Head: Normocephalic, atraumatic, sclera non-icteric, no xanthomas, nares are without discharge.  Neck:  Negative for carotid bruits. JVD not elevated. Lungs: Clear bilaterally to auscultation without wheezes, rales, or rhonchi. Breathing is unlabored. Heart: RRR with S1 S2. No murmurs, rubs, or gallops appreciated. Abdomen: Soft, non-tender, non-distended with normoactive bowel sounds. No hepatomegaly. No rebound/guarding. No obvious abdominal masses. Msk:   Strength and tone appears normal for age. Extremities: No clubbing, cyanosis or edema.  Distal pedal pulses are 2+ and equal bilaterally. Neuro:  Alert and oriented X 3. Moves all extremities spontaneously. Psych:  Responds to questions appropriately with a normal affect.  Labs: Recent Labs  [facility] 02/10/12 1637   WBC 9.1   HGB 12.4   HCT 36.5   MCV 89.7   PLT 288   Lab 02/10/12 1517  NA 138  K 4.2  CL 103  CO2 27  BUN 23  CREATININE 1.10  CALCIUM 9.5  PROT  7.0  BILITOT 0.3  ALKPHOS 78  ALT 11  AST 14  AMYLASE --  LIPASE --  GLUCOSE 132*   Radiology/Studies: Dg Chest Portable 1 View  02/10/2012  *RADIOLOGY REPORT*  Clinical Data: Left-sided chest pain  PORTABLE CHEST - 1 VIEW  Comparison: 12/03/2009  Findings: The heart and pulmonary vascularity are within normal limits.  The lungs are clear bilaterally.  No acute bony abnormality is seen.  IMPRESSION: No acute intrathoracic abnormality.   Original Report Authenticated By: Alcide Clever, M.D.    EKG: NSR, 74 bpm, no ST/T changes  ASSESSMENT:   1. Chest pain, atypical 2. CAD 3. DM 4. HTN 5. HL 6. GERD 7. Tobacco abuse  DISCUSSION/PLAN:  Patient presents to the ED after experiencing sharp, shooting L-sided chest pain radiating to her L-arm and neck lasting for  minutes at a time and relieving. No exertional component or reminiscence to prior MI/angina. It was responsive to NTG SL. No chest pain currently. Objectively, EKG and initial trop reveal no evidence of ischemia. Patient's pain is largely atypical, however given underlying CAD history, cardiac RFs, continued tobacco abuse and lack of follow-up/ischemic eval x 2 years, will plan to observe, rule out overnight. After discussing with MD, plan for outpatient Lexiscan Myoview should she rule out. Continue outpatient medications. Nicoderm to assist w/ tobacco cessation. Switch to low-dose ASA, d/c Plavix. Continue ARB, BB, statin. Hold metformin. Check A1C and lipids. Continue PPI.     Signed, R. Hurman Horn, PA-C 02/10/2012, 6:03 PM  History and all data above reviewed.  Patient examined.  I agree with the findings as above.  Her pain is predominantly atypical. She has no objective evidence of ischemia at the time of this note.  The patient exam reveals COR:RRR  ,  Lungs: Clear  ,  Abd: Positive bowel sounds, no rebound no guarding, Ext No edema  .  All available labs, radiology testing, previous records reviewed. Agree with documented  assessment and plan. Pain has atypical greater than typical features.  We will observe her overnight and cycle enzymes.  If she rules out we can consider out patient stress testing.  Fayrene Fearing Maxamus Colao  8:29 PM  02/10/2012

## 2012-02-10 NOTE — ED Provider Notes (Signed)
History     CSN: [id number]  Arrival date & time 02/10/12  1440   First MD Initiated Contact with Patient 02/10/12 1503      Chief Complaint  Patient presents with  . Chest Pain    (Consider location/radiation/quality/duration/timing/severity/associated sxs/prior treatment) HPI Chest pain for past two days.  Pain described as excruciating, sharp, 10/10 in intensity at maximum. Currently no pain. The location of the patient's problem is left upper chest with radiation to left side of neck.  Onset was two days ago with waxing and waning course since that time.   Modifying factors:  Improved with nitro today. Otherwise, pt not sure what makes it better or worse.  Associated symptoms: denies diaphoresis, nausea. No recent fever.   Past Medical History  Diagnosis Date  . Colon polyps     adenomatous  . GERD (gastroesophageal reflux disease)   . CAD (coronary artery disease)   . MI (myocardial infarction)   . Diabetes mellitus   . Hyperlipemia   . Osteoarthritis   . Depression     Past Surgical History  Procedure Date  . Tonsillectomy   . Appendectomy   . Coronary stent placement 1999, 2001    3 stents    Family History  Problem Relation Age of Onset  . Colon cancer Brother 60  . Lung cancer Sister   . Lung cancer Brother   . Stomach cancer Brother   . Diabetes Daughter     History  Substance Use Topics  . Smoking status: Current Every Day Smoker -- 0.5 packs/day    Types: Cigarettes  . Smokeless tobacco: Never Used  . Alcohol Use: No    OB History    Grav Para Term Preterm Abortions TAB SAB Ect Mult Living                  Review of Systems Negative for respiratory distress, cough. Negative for vomiting, diarrhea.  All other systems reviewed and negative unless noted in HPI.    Allergies  Codeine and Morphine and related  Home Medications   Current Outpatient Rx  Name  Route  Sig  Dispense  Refill  . ACETAMINOPHEN 325 MG PO TABS   Oral   Take  650 mg by mouth every 6 (six) hours as needed.         . ASPIRIN 325 MG PO TBEC   Oral   Take 325 mg by mouth daily.         . ATORVASTATIN CALCIUM 80 MG PO TABS   Oral   Take 80 mg by mouth daily.         Marland Kitchen CLOPIDOGREL BISULFATE 75 MG PO TABS   Oral   Take 75 mg by mouth daily.         Marland Kitchen ESOMEPRAZOLE MAGNESIUM 40 MG PO CPDR   Oral   Take 40 mg by mouth daily before breakfast.         . FLUTICASONE PROPIONATE (NASAL) NA   Nasal   Place 2 sprays into the nose daily.         . GUAIFENESIN ER 600 MG PO TB12   Oral   Take 1,200 mg by mouth as needed.         Marland Kitchen LEVALBUTEROL TARTRATE 45 MCG/ACT IN AERO   Inhalation   Inhale 1-2 puffs into the lungs every 4 (four) hours as needed.         Marland Kitchen LOSARTAN POTASSIUM 100 MG PO TABS  Oral   Take 100 mg by mouth daily.         . MELOXICAM 15 MG PO TABS   Oral   Take 15 mg by mouth daily.         Marland Kitchen METFORMIN HCL ER (MOD) 1000 MG PO TB24   Oral   Take 1,000 mg by mouth 2 (two) times daily with a meal.         . METOPROLOL SUCCINATE ER 50 MG PO TB24   Oral   Take 50 mg by mouth daily. Take with or immediately following a meal.         . NITROGLYCERIN 0.4 MG SL SUBL   Sublingual   Place 0.4 mg under the tongue every 5 (five) minutes as needed.         Marland Kitchen SITAGLIPTIN PHOSPHATE 100 MG PO TABS   Oral   Take 100 mg by mouth daily.         . TRAZODONE HCL 50 MG PO TABS   Oral   Take 50 mg by mouth at bedtime.           BP 116/65  Pulse 75  Temp 98.8 F (37.1 C) (Oral)  Resp 12  SpO2 100%  Physical Exam Nursing note and vitals reviewed.  Constitutional: Pt is alert and appears stated age. Oropharynx: Airway open without erythema or exudate. Respiratory: No respiratory distress. Equal breathing bilaterally. CV: Extremities warm and well perfused. Neuro: No motor nor sensory deficit. Head: Normocephalic and atraumatic. Eyes: No conjunctivitis, no scleral icterus. Neck: Supple, no  mass. Chest: Non-tender. Abdomen: Soft, non-tender MSK: Extremities are atraumatic without deformity. Skin: No rash, no wounds.  ED Course  Procedures (including critical care time)  Labs Reviewed  COMPREHENSIVE METABOLIC PANEL - Abnormal; Notable for the following:    Glucose, Bld 132 (*)     GFR calc non Af Amer 50 (*)     GFR calc Af Amer 58 (*)     All other components within normal limits  MAGNESIUM  POCT I-STAT TROPONIN I  CBC WITH DIFFERENTIAL   Dg Chest Portable 1 View  02/10/2012  *RADIOLOGY REPORT*  Clinical Data: Left-sided chest pain  PORTABLE CHEST - 1 VIEW  Comparison: 12/03/2009  Findings: The heart and pulmonary vascularity are within normal limits.  The lungs are clear bilaterally.  No acute bony abnormality is seen.  IMPRESSION: No acute intrathoracic abnormality.   Original Report Authenticated By: Alcide Clever, M.D.      1. Chest pain       MDM  71 y.o. female here with chest pain.  Pertinent past problems include CAD with stents x3 on plavix, DM, HL, current tobacco use. Although typical chest pain, daughter states MI chest pain was typical as well. Will work up for ACS. Doubt dissection, PE, esophageal rupture.   Medications/interventions:  ASA, nitro given by EMS with resolution. Tylenol for headache.   Data reviewed: EKG ordered and interpreted by me: NSR, no axis deviation, no QRS widening, no significant changes from prior EKG and no ST elevation/depression, widened p wave.  Lab tests ordered and reviewed by me: troponin low, CMP unremarkable, Magnesium ok. CBC unremarkable.  I independently viewed the following imaging studies and reviewed radiology's interpretation as summarized: CXR without acute abnormality.  Course of care: Pt and daughter updated. Pt resting comfortably, NAD. Cardiology consulted.   Medical Decision Making discussed with ED attending Celene Kras, MD        Jeanella Craze  Merlean Pizzini, MD 02/10/12 1919

## 2012-02-10 NOTE — ED Notes (Signed)
Pt denies chest pain; pt denies nausea and dizziness.

## 2012-02-10 NOTE — Consult Note (Deleted)
Cardiology Consult Note   Patient ID: Katherine Cole MRN: [id number], DOB/AGE: 1940-09-28   Admit date: 02/10/2012 Date of Consult: 02/10/2012  Primary Physician: Hoyle Sauer, MD Primary Cardiologist: Previously Dr. Deborah Chalk  Reason for consult: evaluation/management of chest pain  HPI: [name] is a 71yo female w/ PMHx s/f CAD (s/p PCI x 3 stents), DM, HL, HTN, GERD, tobacco abuse who presents to [facility] ED with chest pain.   She last seen by Dr. Deborah Chalk < 2 years. Nuclear stress test at that time was "normal." No limitation in functional status. Able to go on long walks w/o experiencing chest pain, SOB or lightheadedness. She reports sharp, shooting L-sided chest pain beginning last night and into this morning radiating to her left arm lasting 1-2 minutes w/ associated SOB while watching television. No diaphoresis, nausea, PND, LEE, orthopnea, lightheadedness or syncope. Not reminiscent of previous angina. Relieved by NTG SL. No aggravation w/position changes or inspiration. No new cough, fevers, chills, no unilateral leg swelling, redness or tenderness. Reports 1 PPD. No alcohol. No elicit drugs.   In the ED, EKG reveals no evidence of ischemia. Initial trop-I WNL. CBC & BMET WNL. CXR nonacute. VSS.   Problem List: Past Medical History  Diagnosis Date  . Colon polyps     adenomatous  . GERD (gastroesophageal reflux disease)   . CAD (coronary artery disease)   . MI (myocardial infarction)   . Diabetes mellitus   . Hyperlipemia   . Osteoarthritis   . Depression     Past Surgical History  Procedure Date  . Tonsillectomy   . Appendectomy   . Coronary stent placement 1999, 2001    3 stents     Allergies:  Allergies  Allergen Reactions  . Codeine Other (See Comments)    Head ache   . Morphine And Related Other (See Comments)    headaches    Home Medications: Prior to Admission medications   Medication Sig Start Date End Date Taking? Authorizing Provider  acetaminophen  (TYLENOL) 325 MG tablet Take 650 mg by mouth every 6 (six) hours as needed. For pain   Yes Historical Provider, MD  aspirin 325 MG EC tablet Take 325 mg by mouth daily.   Yes Historical Provider, MD  atorvastatin (LIPITOR) 80 MG tablet Take 80 mg by mouth daily.   Yes Historical Provider, MD  clopidogrel (PLAVIX) 75 MG tablet Take 75 mg by mouth daily.   Yes Historical Provider, MD  esomeprazole (NEXIUM) 40 MG capsule Take 40 mg by mouth daily as needed. For acid reflux   Yes Historical Provider, MD  glimepiride (AMARYL) 1 MG tablet Take 1 mg by mouth daily before breakfast.   Yes Historical Provider, MD  ibuprofen (ADVIL,MOTRIN) 200 MG tablet Take 400 mg by mouth every 6 (six) hours as needed. For pain   Yes Historical Provider, MD  losartan (COZAAR) 100 MG tablet Take 100 mg by mouth daily.   Yes Historical Provider, MD  meloxicam (MOBIC) 15 MG tablet Take 15 mg by mouth daily.   Yes Historical Provider, MD  metFORMIN (GLUCOPHAGE) 500 MG tablet Take 1,000 mg by mouth 2 (two) times daily with a meal.   Yes Historical Provider, MD  metoprolol succinate (TOPROL-XL) 50 MG 24 hr tablet Take 50 mg by mouth daily. Take with or immediately following a meal.   Yes Historical Provider, MD  nitroGLYCERIN (NITROSTAT) 0.4 MG SL tablet Place 0.4 mg under the tongue every 5 (five) minutes as needed.  Yes Historical Provider, MD    Inpatient Medications:     . [COMPLETED] acetaminophen  650 mg Oral Once    (Not in a hospital admission)  Family History  Problem Relation Age of Onset  . Colon cancer Brother 66  . Lung cancer Sister   . Lung cancer Brother   . Stomach cancer Brother   . Diabetes Daughter      History   Social History  . Marital Status: Married    Spouse Name: N/A    Number of Children: 3  . Years of Education: N/A   Occupational History  . Retired    Social History Main Topics  . Smoking status: Current Every Day Smoker -- 0.5 packs/day    Types: Cigarettes  . Smokeless  tobacco: Never Used  . Alcohol Use: No  . Drug Use: No  . Sexually Active: Not on file   Other Topics Concern  . Not on file   Social History Narrative  . No narrative on file     Review of Systems: General: negative for chills, fever, night sweats or weight changes.  Cardiovascular: positive for chest pain and SOB, negative for dyspnea on exertion, edema, orthopnea, palpitations, paroxysmal nocturnal dyspnea  Dermatological: negative for rash Respiratory: negative for cough or wheezing Urologic:  negative for hematuria Abdominal: negative for nausea, vomiting, diarrhea, bright red blood per rectum, melena, or hematemesis Neurologic: negative for visual changes, syncope, or dizziness All other systems reviewed and are otherwise negative except as noted above.  Physical Exam: Blood pressure 112/51, pulse 70, temperature 98.8 F (37.1 C), temperature source Oral, resp. rate 20, SpO2 97.00%.    General: Well developed, well nourished, in no acute distress. Head: Normocephalic, atraumatic, sclera non-icteric, no xanthomas, nares are without discharge.  Neck:  Negative for carotid bruits. JVD not elevated. Lungs: Clear bilaterally to auscultation without wheezes, rales, or rhonchi. Breathing is unlabored. Heart: RRR with S1 S2. No murmurs, rubs, or gallops appreciated. Abdomen: Soft, non-tender, non-distended with normoactive bowel sounds. No hepatomegaly. No rebound/guarding. No obvious abdominal masses. Msk:   Strength and tone appears normal for age. Extremities: No clubbing, cyanosis or edema.  Distal pedal pulses are 2+ and equal bilaterally. Neuro:  Alert and oriented X 3. Moves all extremities spontaneously. Psych:  Responds to questions appropriately with a normal affect.  Labs: Recent Labs  [facility] 02/10/12 1637   WBC 9.1   HGB 12.4   HCT 36.5   MCV 89.7   PLT 288   Lab 02/10/12 1517  NA 138  K 4.2  CL 103  CO2 27  BUN 23  CREATININE 1.10  CALCIUM 9.5  PROT  7.0  BILITOT 0.3  ALKPHOS 78  ALT 11  AST 14  AMYLASE --  LIPASE --  GLUCOSE 132*   Radiology/Studies: Dg Chest Portable 1 View  02/10/2012  *RADIOLOGY REPORT*  Clinical Data: Left-sided chest pain  PORTABLE CHEST - 1 VIEW  Comparison: 12/03/2009  Findings: The heart and pulmonary vascularity are within normal limits.  The lungs are clear bilaterally.  No acute bony abnormality is seen.  IMPRESSION: No acute intrathoracic abnormality.   Original Report Authenticated By: Alcide Clever, M.D.    EKG: NSR, 74 bpm, no ST/T changes  ASSESSMENT:   1. Chest pain, atypical 2. CAD 3. DM 4. HTN 5. HL 6. GERD 7. Tobacco abuse  DISCUSSION/PLAN:  Patient presents to the ED after experiencing sharp, shooting L-sided chest pain radiating to her L-arm and neck lasting  for minutes at a time and relieving. No exertional component or reminiscence to prior MI/angina. It was responsive to NTG SL. No chest pain currently. Objectively, EKG and initial trop reveal no evidence of ischemia. Patient's pain is largely atypical, however given underlying CAD history, cardiac RFs, continued tobacco abuse and lack of follow-up/ischemic eval x 2 years, will plan to observe, rule out overnight. After discussing with MD, plan for outpatient Lexiscan Myoview should she rule out. Continue outpatient medications. Nicoderm to assist w/ tobacco cessation. Switch to low-dose ASA, d/c Plavix. Continue ARB, BB, statin. Hold metformin. Check A1C and lipids. Continue PPI.     Signed, R. Hurman Horn, PA-C 02/10/2012, 6:03 PM

## 2012-02-10 NOTE — ED Notes (Signed)
Meal tray delivered.

## 2012-02-11 ENCOUNTER — Encounter (HOSPITAL_COMMUNITY): Payer: Self-pay | Admitting: Cardiology

## 2012-02-11 LAB — BASIC METABOLIC PANEL
Calcium: 9.1 mg/dL (ref 8.4–10.5)
Creatinine, Ser: 1.09 mg/dL (ref 0.50–1.10)
GFR calc non Af Amer: 51 mL/min — ABNORMAL LOW (ref >90)
Sodium: 136 mEq/L (ref 135–145)

## 2012-02-11 LAB — TSH: TSH: 1.229 u[IU]/mL (ref 0.350–4.500)

## 2012-02-11 LAB — LIPID PANEL
Cholesterol: 184 mg/dL (ref 0–200)
VLDL: 57 mg/dL — ABNORMAL HIGH (ref 0–40)

## 2012-02-11 LAB — CBC
MCH: 30 pg (ref 26.0–34.0)
MCHC: 33.9 g/dL (ref 30.0–36.0)
Platelets: 271 10*3/uL (ref 150–400)

## 2012-02-11 LAB — HEMOGLOBIN A1C: Hgb A1c MFr Bld: 8.9 % — ABNORMAL HIGH (ref <5.7)

## 2012-02-11 LAB — GLUCOSE, CAPILLARY: Glucose-Capillary: 167 mg/dL — ABNORMAL HIGH (ref 70–99)

## 2012-02-11 LAB — TROPONIN I: Troponin I: <0.3 ng/mL (ref <0.30)

## 2012-02-11 MED ORDER — ASPIRIN 81 MG PO TBEC: 81.0000 mg | DELAYED_RELEASE_TABLET | Freq: Every day | ORAL | Status: DC

## 2012-02-11 MED ORDER — NICOTINE 14 MG/24HR TD PT24: MEDICATED_PATCH | TRANSDERMAL | Status: DC

## 2012-02-11 NOTE — Discharge Summary (Signed)
Discharge Summary   Patient ID: KAHLA RISDON MRN: [id number], DOB/AGE: 1940/06/02 71 y.o.  Primary MD: Hoyle Sauer, MD Primary Cardiologist: Dr. Antoine Poche Admit date: 02/10/2012 D/C date:     02/11/2012      Primary Discharge Diagnoses:  1. Midsternal chest pain  - No objective evidence of acute cardiac ischemia  - Outpatient lexiscan myoview  2. Tobacco Abuse  Secondary Discharge Diagnoses:  . Colon polyps     adenomatous  . GERD (gastroesophageal reflux disease)   . CAD (coronary artery disease)     s/p PCI x 3 stents  . MI (myocardial infarction)   . Hyperlipemia   . Osteoarthritis   . Depression   . Hypertension   . Diabetes mellitus, type 2     Allergies Allergies  Allergen Reactions  . Codeine Other (See Comments)    Head ache   . Morphine And Related Other (See Comments)    headaches    Diagnostic Studies/Procedures:  None  History of Present Illness: 71 y.o. female w/ the above medical problems who presented to [facility] on 02/10/12 with complaints of chest pain. She reported sharp, shooting L-sided chest pain beginning the night prior to presentation and into the morning with radiation to her left arm. It lasted 1-2 minutes w/ associated SOB and was relieved with SL NTG prompting her to present to ED.  Hospital Course: In the ED, EKG revealed NSR with no acute ST/T changes. CXR was without acute cardiopulmonary abnormalities. Labs were significant for normal troponin and unremarkable CBC/BMET. It was felt her symptoms were atypical for cardiac ischemia, but given her cardiac history and risk factors she was observed overnight. Cardiac enzymes were cycled and remained negative. EKG remained nonischemic.  She was seen and evaluated by Dr. Patty Sermons who felt she was stable for discharge home with plans for further ischemic evaluation with outpatient lexiscan myoview.  Discharge Vitals: Blood pressure 125/60, pulse 71, temperature 98.3 F (36.8  C), temperature source Oral, resp. rate 18, height 5\' 1"  (1.549 m), weight 106 lb 4.2 oz (48.2 kg), SpO2 98.00%.  Labs: Component Value Date   WBC 7.7 02/11/2012   HGB 11.7* 02/11/2012   HCT 34.5* 02/11/2012   MCV 88.5 02/11/2012   PLT 271 02/11/2012    Lab 02/11/12 0253 02/10/12 1517  NA 136 --  K 4.0 --  CL 102 --  CO2 25 --  BUN 24* --  CREATININE 1.09 --  CALCIUM 9.1 --  PROT -- 7.0  BILITOT -- 0.3  ALKPHOS -- 78  ALT -- 11  AST -- 14  GLUCOSE 218* --   Basename 02/11/12 0253 02/10/12 2142  TROPONINI <0.30 <0.30   Component Value Date   CHOL 184 02/11/2012   HDL 48 02/11/2012   LDLCALC 79 02/11/2012   TRIG 287* 02/11/2012     02/10/2012 21:42  Hemoglobin A1C 8.9 (H)     02/10/2012 21:42  TSH 1.229     Discharge Medications     Medication List     As of 02/11/2012 11:04 AM    STOP taking these medications         clopidogrel 75 MG tablet   Commonly known as: PLAVIX      TAKE these medications         acetaminophen 325 MG tablet   Commonly known as: TYLENOL   Take 650 mg by mouth every 6 (six) hours as needed. For pain      aspirin 81  MG EC tablet   Take 1 tablet (81 mg total) by mouth daily.      atorvastatin 80 MG tablet   Commonly known as: LIPITOR   Take 80 mg by mouth daily.      esomeprazole 40 MG capsule   Commonly known as: NEXIUM   Take 40 mg by mouth daily as needed. For acid reflux      glimepiride 1 MG tablet   Commonly known as: AMARYL   Take 1 mg by mouth daily before breakfast.      ibuprofen 200 MG tablet   Commonly known as: ADVIL,MOTRIN   Take 400 mg by mouth every 6 (six) hours as needed. For pain      losartan 100 MG tablet   Commonly known as: COZAAR   Take 100 mg by mouth daily.      meloxicam 15 MG tablet   Commonly known as: MOBIC   Take 15 mg by mouth daily.      metFORMIN 500 MG tablet   Commonly known as: GLUCOPHAGE   Take 1,000 mg by mouth 2 (two) times daily with a meal.      metoprolol succinate 50 MG 24  hr tablet   Commonly known as: TOPROL-XL   Take 50 mg by mouth daily. Take with or immediately following a meal.      nicotine 14 mg/24hr patch   Commonly known as: NICODERM CQ - dosed in mg/24 hours   21mg  patch one daily for 6 weeks (dispense #42), then 14mg  patch one daily for 2 weeks (dispense #14), then 7mg  patch one daily for 2 weeks (dispense #14). Remove old patch before applying new one.      nitroGLYCERIN 0.4 MG SL tablet   Commonly known as: NITROSTAT   Place 0.4 mg under the tongue every 5 (five) minutes as needed.          Disposition   Discharge Orders    Future Appointments: Provider: Department: Dept Phone: Center:   02/18/2012 11:45 AM [facility] 1 ([facility]) [facility] SITE 3 NUCLEAR MED [phone] None   02/25/2012 2:00 PM Rosalio Macadamia, NP [facility] Main Office [place]) [phone] [id number]     Future Orders Please Complete By Expires   Diet - low sodium heart healthy      Increase activity slowly      Discharge instructions      Comments:   * Please stop smoking!  * You have a Stress Test scheduled on 02/18/12 at [facility] in [place]. Your doctor has ordered this test to get a better idea of how your heart works.  Please arrive 15 minutes early for paperwork.   Location: [address], [address] [place], [place] [address] [phone]  Instructions: No food/drink after midnight the night before.  No caffeine/decaf products 24 hours before, including medicines such as Excedrin or Goody Powders. Call if there are any questions.  Wear comfortable clothes and shoes.  It is OK to take your morning meds with a sip of water EXCEPT for those types of medicines listed below or otherwise instructed.  Special Medication Instructions: Beta blockers such as metoprolol (Lopressor/Toprol XL), atenolol (Tenormin), carvedilol (Coreg), nebivolol (Bystolic), propranolol (Inderal) should not be taken for 24 hours before  the test. Calcium channel blockers such as diltiazem (Cardizem) or verapmil (Calan) should not be taken for 24 hours before the test. Remove nitroglycerin patches and do not take nitrate preparations such as Imdur/isosorbide the day  of your test. No Persantine/Theophylline or Aggrenox medicines should be used within 24 hours of the test.   What To Expect: The whole test will take several hours. When you arrive in the lab, the technician will inject a small amount of radioactive tracer into your arm through an IV while you are resting quietly. This helps [place] to form pictures of your heart. You will likely only feel a sting from the IV. After a waiting period, resting pictures will be obtained under a big camera. These are the "before" pictures.  Next, you will be prepped for the stress portion of the test. This will include receiving a medicine that helps to dilate blood vessels in your heart to simulate the effect of exercise on your heart. This may cause temporary nausea, flushing, shortness of breath and sometimes chest discomfort or vomiting. This is typically short-lived and usually resolves quickly. Your blood pressure and heart rate will be monitored, and we will be watching your EKG on a computer screen for any changes. During this portion of the test, the radiologist will inject another small amount of radioactive tracer into your IV. After a waiting period, you will undergo a second set of pictures. These are the "after" pictures.  The doctor reading the test will compare the before-and-after images to look for evidence of heart blockages or heart weakness.     Follow-up Information    Follow up with [facility]. (Stress test @ 11:45. Please arrive by 11:30)    Contact information:   [address] [place] [address] [phone]      Follow up with Norma Fredrickson, NP. On 02/25/2012. (2:00)    Contact information:    [facility] [address] [place] [address] [phone]       Follow up with Hoyle Sauer, MD. (As needed)    Contact information:   [address] [facility] [place] [address] [phone]           Outstanding Labs/Studies:  1. Lexiscan Myoview  Duration of Discharge Encounter: Greater than 30 minutes including physician and PA time.  Signed, Chosen Garron PA-C 02/11/2012, 11:04 AM

## 2012-02-11 NOTE — Progress Notes (Signed)
Subjective:  Feels well this am. No more severe chest pain. Still very mild twinges in left upper chest lasting only a few seconds.  Pain very atypical for ischemia. Repeat EKG is normal. Cardiac enzymes negative x2 Telemetry shows NSR.  Objective:  Vital Signs in the last 24 hours: Temp:  [97.6 F (36.4 C)-98.8 F (37.1 C)] 98.3 F (36.8 C) (11/05 0455) Pulse Rate:  [67-78] 71  (11/05 0455) Resp:  [12-20] 18  (11/05 0455) BP: (101-145)/(51-66) 125/60 mmHg (11/05 0455) SpO2:  [97 %-100 %] 98 % (11/05 0455) Weight:  [106 lb 4.2 oz (48.2 kg)] 106 lb 4.2 oz (48.2 kg) (11/04 2055)  Intake/Output from previous day:   Intake/Output from this shift:       . [COMPLETED] acetaminophen  650 mg Oral Once  . aspirin EC  81 mg Oral Daily  . atorvastatin  80 mg Oral Daily  . glimepiride  1 mg Oral QAC breakfast  . insulin aspart  0-9 Units Subcutaneous TID WC  . losartan  100 mg Oral Daily  . metoprolol succinate  50 mg Oral Daily  . nicotine  14 mg Transdermal Daily  . pantoprazole  80 mg Oral Q1200      Physical Exam: The patient appears to be in no distress.  Head and neck exam reveals that the pupils are equal and reactive.  The extraocular movements are full.  There is no scleral icterus.  Mouth and pharynx are benign.  No lymphadenopathy.  No carotid bruits.  The jugular venous pressure is normal.  Thyroid is not enlarged or tender.  Chest reveals few expiratory wheezes.  Heart reveals no abnormal lift or heave.  First and second heart sounds are normal.  There is no murmur gallop rub or click.  The abdomen is soft and nontender.  Bowel sounds are normoactive.  There is no hepatosplenomegaly or mass.  There are no abdominal bruits.  Extremities reveal no phlebitis or edema.  Pedal pulses are good.  There is no cyanosis or clubbing.  Neurologic exam is normal strength and no lateralizing weakness.  No sensory deficits.  Integument reveals no rash  Lab  Results:  Basename 02/11/12 0253 02/10/12 1637  WBC 7.7 9.1  HGB 11.7* 12.4  PLT 271 288    Basename 02/11/12 0253 02/10/12 1517  NA 136 138  K 4.0 4.2  CL 102 103  CO2 25 27  GLUCOSE 218* 132*  BUN 24* 23  CREATININE 1.09 1.10    Basename 02/11/12 0253 02/10/12 2142  TROPONINI <0.30 <0.30   Hepatic Function Panel  Basename 02/10/12 1517  PROT 7.0  ALBUMIN 3.5  AST 14  ALT 11  ALKPHOS 78  BILITOT 0.3  BILIDIR --  IBILI --    Basename 02/11/12 0253  CHOL 184   No results found for this basename: PROTIME in the last 72 hours  Imaging: Dg Chest Portable 1 View  02/10/2012  *RADIOLOGY REPORT*  Clinical Data: Left-sided chest pain  PORTABLE CHEST - 1 VIEW  Comparison: 12/03/2009  Findings: The heart and pulmonary vascularity are within normal limits.  The lungs are clear bilaterally.  No acute bony abnormality is seen.  IMPRESSION: No acute intrathoracic abnormality.   Original Report Authenticated By: Alcide Clever, M.D.     Cardiac Studies:  Assessment/Plan:  Patient Active Hospital Problem List: Atypical chest pain (02/10/2012)   Assessment: No evidence of ischemia or MI   Plan: Outpatient walking lexiscan myoview. DIABETES MELLITUS, TYPE II, CONTROLLED (08/01/2007)  Assessment: Stable   Plan: follow up with Dr. Felipa Eth HYPERLIPIDEMIA (08/01/2007)   Assessment: Stable   Plan: Continue statin GASTROESOPHAGEAL REFLUX DISEASE (08/01/2007)   Assessment: Stable    Plan: Continue nexium Tobacco abuse (02/10/2012)   Assessment: Smokes 1 ppd   Plan: Urged to quit. Hypertension (02/10/2012)   Assessment: BP stable   Plan: Continue home meds.  Plan: discharge today. Outpatient walking lexiscan myoview.  Two years ago unable to do regular stress Myoview and had to be converted to lexiscan.   LOS: 1 day    Cassell Clement 02/11/2012, 7:22 AM

## 2012-02-11 NOTE — Progress Notes (Signed)
Pt discharge instructions and patient education complete. IV site d/c. Site WNL. No s/s of distress. Stress test arranged and explained to patient and daughter. D/C home with family. Katherine Cole

## 2012-02-13 ENCOUNTER — Encounter: Payer: Self-pay | Admitting: Cardiovascular Disease

## 2012-02-18 ENCOUNTER — Ambulatory Visit (HOSPITAL_COMMUNITY): Payer: Medicare Other | Attending: Cardiology | Admitting: Radiology

## 2012-02-18 VITALS — BP 137/67 | HR 60 | Ht 61.0 in | Wt 104.0 lb

## 2012-02-18 DIAGNOSIS — E119 Type 2 diabetes mellitus without complications: Secondary | ICD-10-CM | POA: Insufficient documentation

## 2012-02-18 DIAGNOSIS — E785 Hyperlipidemia, unspecified: Secondary | ICD-10-CM | POA: Insufficient documentation

## 2012-02-18 DIAGNOSIS — R0602 Shortness of breath: Secondary | ICD-10-CM | POA: Insufficient documentation

## 2012-02-18 DIAGNOSIS — R5381 Other malaise: Secondary | ICD-10-CM | POA: Insufficient documentation

## 2012-02-18 DIAGNOSIS — Z8249 Family history of ischemic heart disease and other diseases of the circulatory system: Secondary | ICD-10-CM | POA: Insufficient documentation

## 2012-02-18 DIAGNOSIS — I10 Essential (primary) hypertension: Secondary | ICD-10-CM | POA: Insufficient documentation

## 2012-02-18 DIAGNOSIS — I2510 Atherosclerotic heart disease of native coronary artery without angina pectoris: Secondary | ICD-10-CM

## 2012-02-18 DIAGNOSIS — I252 Old myocardial infarction: Secondary | ICD-10-CM | POA: Insufficient documentation

## 2012-02-18 DIAGNOSIS — F17200 Nicotine dependence, unspecified, uncomplicated: Secondary | ICD-10-CM | POA: Insufficient documentation

## 2012-02-18 DIAGNOSIS — R079 Chest pain, unspecified: Secondary | ICD-10-CM | POA: Insufficient documentation

## 2012-02-18 MED ORDER — TECHNETIUM TC 99M SESTAMIBI GENERIC - CARDIOLITE
30.0000 | Freq: Once | INTRAVENOUS | Status: AC | PRN
  Administered 2012-02-18: 30 via INTRAVENOUS

## 2012-02-18 MED ORDER — TECHNETIUM TC 99M SESTAMIBI GENERIC - CARDIOLITE
10.0000 | Freq: Once | INTRAVENOUS | Status: AC | PRN
  Administered 2012-02-18: 10 via INTRAVENOUS

## 2012-02-18 MED ORDER — REGADENOSON 0.4 MG/5ML IV SOLN
0.4000 mg | Freq: Once | INTRAVENOUS | Status: AC
  Administered 2012-02-18: 0.4 mg via INTRAVENOUS

## 2012-02-18 MED ORDER — AMINOPHYLLINE 25 MG/ML IV SOLN
75.0000 mg | Freq: Once | INTRAVENOUS | Status: AC
  Administered 2012-02-18: 75 mg via INTRAVENOUS

## 2012-02-18 NOTE — Progress Notes (Signed)
[facility] SITE 3 NUCLEAR MED [id number] [place] [address] [phone]  Cardiology Nuclear Med Study  Katherine Cole is a 71 y.o. female     MRN : [id number]     DOB: 1940/11/26  Procedure Date: 02/18/2012  Nuclear Med Background Indication for Stress Test:  Evaluation for Ischemia, Stent Patency and Patient seen in Hospital on 02/10/12 for Chest Pain with Negative Enzymes  History:  '97 MI; '01 Stent; <2 yrs ago [id number];  Cardiac Risk Factors: Family History - CAD, Hypertension, Lipids, NIDDM and Smoker  Symptoms:  Chest Pain (last date of chest discomfort was yesterday), Fatigue and SOB with Chest Pain   Nuclear Pre-Procedure Caffeine/Decaff Intake:  None > 12 hrs NPO After: 8:30pm   Lungs:  Clear. O2 Sat: 99% on room air. IV 0.9% NS with Angio Cath:  22g  IV Site: R Antecubital x 1, tolerated well IV Started by:  Irean Hong, RN  Chest Size (in):  34 Cup Size: B  Height: 5\' 1"  (1.549 m)  Weight:  104 lb (47.174 kg)  BMI:  Body mass index is 19.65 kg/(m^2). Tech Comments:  Held metformin today. Last Toprol x 24 hrs    Nuclear Med Study 1 or 2 day study: 1 day  Stress Test Type:  Treadmill/Lexiscan  Reading MD: Willa Rough, MD  Order Authorizing Provider:  Rollene Rotunda, MD, and Norma Fredrickson, NP  Resting Radionuclide: Technetium 38m Sestamibi  Resting Radionuclide Dose: 11.0 mCi   Stress Radionuclide:  Technetium 28m Sestamibi  Stress Radionuclide Dose: 33.0 mCi           Stress Protocol Rest HR: 60 Stress HR: 96  Rest BP: 137/67 Stress BP: 142/58  Exercise Time (min): 2:00 METS: n/a   Predicted Max HR: 151 bpm % Max HR: 63.58 bpm Rate Pressure Product: 10272   Dose of Adenosine (mg):  n/a Dose of Lexiscan: 0.4 mg  Dose of Atropine (mg): n/a Dose of Dobutamine: n/a mcg/kg/min (at max HR)  Stress Test Technologist: Smiley Houseman, CMA-N  Nuclear Technologist:  Domenic Polite, CNMT     Rest Procedure:  Myocardial  perfusion imaging was performed at rest 45 minutes following the intravenous administration of Technetium 20m Sestamibi.  Rest ECG: No acute changes.  Stress Procedure:  The patient received IV Lexiscan 0.4 mg over 15-seconds with concurrent low level exercise and then Technetium 42m Sestamibi was injected at 30-seconds while the patient continued walking one more minute. There were no diagnostic changes, but there were nonspecific ST-T wave changes with Lexiscan. Patient was very symptomatic with Lexiscan and she was give Aminophylline 75 mg IV with relief.  Quantitative spect images were obtained after a 45-minute delay.  Stress ECG: No significant ST segment change suggestive of ischemia.  QPS Raw Data Images:  Normal; no motion artifact; normal heart/lung ratio. Stress Images:  Normal homogeneous uptake in all areas of the myocardium. Rest Images:  Normal homogeneous uptake in all areas of the myocardium. Subtraction (SDS):  No evidence of ischemia. Transient Ischemic Dilatation (Normal <1.22):  1.18 Lung/Heart Ratio (Normal <0.45):  0.38  Quantitative Gated Spect Images QGS EDV:  53 ml QGS ESV:  16 ml  Impression Exercise Capacity:  Lexiscan with low level exercise. BP Response:  Normal blood pressure response. Clinical Symptoms:  nausea ECG Impression:  No significant ST segment change suggestive of ischemia. Comparison with Prior Nuclear Study: No images to compare  Overall Impression:  Normal stress nuclear study.  LV  Ejection Fraction: 70%.  LV Wall Motion:  Normal Wall Motion.  Willa Rough, MD

## 2012-02-25 ENCOUNTER — Ambulatory Visit (INDEPENDENT_AMBULATORY_CARE_PROVIDER_SITE_OTHER): Payer: Medicare Other | Admitting: Nurse Practitioner

## 2012-02-25 ENCOUNTER — Encounter: Payer: Self-pay | Admitting: Nurse Practitioner

## 2012-02-25 ENCOUNTER — Other Ambulatory Visit: Payer: Self-pay | Admitting: Nurse Practitioner

## 2012-02-25 VITALS — BP 142/64 | HR 58 | Ht 61.0 in | Wt 108.8 lb

## 2012-02-25 DIAGNOSIS — R079 Chest pain, unspecified: Secondary | ICD-10-CM

## 2012-02-25 DIAGNOSIS — Z01810 Encounter for preprocedural cardiovascular examination: Secondary | ICD-10-CM

## 2012-02-25 LAB — CBC WITH DIFFERENTIAL/PLATELET
Basophils Absolute: 0.1 10*3/uL (ref 0.0–0.1)
Basophils Relative: 0.4 % (ref 0.0–3.0)
Eosinophils Absolute: 1.1 10*3/uL — ABNORMAL HIGH (ref 0.0–0.7)
Eosinophils Relative: 7.1 % — ABNORMAL HIGH (ref 0.0–5.0)
HCT: 39.1 % (ref 36.0–46.0)
Hemoglobin: 13.1 g/dL (ref 12.0–15.0)
Lymphocytes Relative: 24.2 % (ref 12.0–46.0)
Lymphs Abs: 3.7 10*3/uL (ref 0.7–4.0)
MCHC: 33.5 g/dL (ref 30.0–36.0)
MCV: 92.1 fl (ref 78.0–100.0)
Monocytes Absolute: 0.7 10*3/uL (ref 0.1–1.0)
Monocytes Relative: 4.5 % (ref 3.0–12.0)
Neutro Abs: 9.7 10*3/uL — ABNORMAL HIGH (ref 1.4–7.7)
Neutrophils Relative %: 63.8 % (ref 43.0–77.0)
Platelets: 314 10*3/uL (ref 150.0–400.0)
RBC: 4.25 Mil/uL (ref 3.87–5.11)
RDW: 13.7 % (ref 11.5–14.6)
WBC: 15.2 10*3/uL — ABNORMAL HIGH (ref 4.5–10.5)

## 2012-02-25 LAB — BASIC METABOLIC PANEL
BUN: 19 mg/dL (ref 6–23)
CO2: 26 mEq/L (ref 19–32)
Calcium: 9.6 mg/dL (ref 8.4–10.5)
Chloride: 98 mEq/L (ref 96–112)
Creatinine, Ser: 1 mg/dL (ref 0.4–1.2)
GFR: 60.47 mL/min (ref >60.00)
Glucose, Bld: 219 mg/dL — ABNORMAL HIGH (ref 70–99)
Potassium: 4 mEq/L (ref 3.5–5.1)
Sodium: 134 mEq/L — ABNORMAL LOW (ref 135–145)

## 2012-02-25 LAB — PROTIME-INR
INR: 1 ratio (ref 0.8–1.0)
Prothrombin Time: 10.8 s (ref 10.2–12.4)

## 2012-02-25 LAB — APTT: aPTT: 28 s (ref 21.7–28.8)

## 2012-02-25 NOTE — Progress Notes (Signed)
 Katherine Cole Date of Birth: 02/18/1941 Medical Record #[id number]  History of Present Illness: Katherine Cole is seen today for a post hospital visit. She is seen for Dr. Hochrein. She is a former patient of Dr. Tennant's. Has known CAD with prior PCI with 3 stents reported. Other issues include polyps, GERD, HLD, OA, depression, HTN, DM and ongoing tobacco abuse.   She comes back today. She is here with her daughter. She continues to have chest pain. It is daily. Starts in the left chest. Radiates up to the neck. Will last for a couple of minutes. Has used NTG with relief. Continues to smoke. A1C is over 9. Now on insulin. Already on PPI therapy.   Current Outpatient Prescriptions on File Prior to Visit  Medication Sig Dispense Refill  . acetaminophen (TYLENOL) 325 MG tablet Take 650 mg by mouth every 6 (six) hours as needed. For pain      . aspirin 81 MG EC tablet Take 1 tablet (81 mg total) by mouth daily.      . atorvastatin (LIPITOR) 80 MG tablet Take 80 mg by mouth daily.      . esomeprazole (NEXIUM) 40 MG capsule Take 40 mg by mouth daily as needed. For acid reflux      . glimepiride (AMARYL) 1 MG tablet Take 1 mg by mouth daily before breakfast.      . ibuprofen (ADVIL,MOTRIN) 200 MG tablet Take 400 mg by mouth every 6 (six) hours as needed. For pain      . insulin glargine (LANTUS) 100 UNIT/ML injection Inject 10 Units into the skin at bedtime.      . losartan (COZAAR) 100 MG tablet Take 100 mg by mouth daily.      . meloxicam (MOBIC) 15 MG tablet Take 15 mg by mouth daily.      . metFORMIN (GLUCOPHAGE) 500 MG tablet Take 1,000 mg by mouth 2 (two) times daily with a meal.      . metoprolol succinate (TOPROL-XL) 50 MG 24 hr tablet Take 50 mg by mouth daily. Take with or immediately following a meal.      . nitroGLYCERIN (NITROSTAT) 0.4 MG SL tablet Place 0.4 mg under the tongue every 5 (five) minutes as needed.        Allergies  Allergen Reactions  . Codeine Other (See Comments)   Head ache   . Morphine And Related Other (See Comments)    headaches    Past Medical History  Diagnosis Date  . Colon polyps     adenomatous  . GERD (gastroesophageal reflux disease)   . CAD (coronary artery disease)     s/p PCI x 3 stents; details not available; negative Myoview 11/13  . MI (myocardial infarction)   . Hyperlipemia   . Osteoarthritis   . Depression   . Tobacco abuse   . Hypertension   . Diabetes mellitus, type 2     Past Surgical History  Procedure Date  . Tonsillectomy   . Appendectomy   . Coronary stent placement 1999, 2001    3 stents    History  Smoking status  . Current Every Day Smoker -- 0.5 packs/day  . Types: Cigarettes  Smokeless tobacco  . Never Used    History  Alcohol Use No    Family History  Problem Relation Age of Onset  . Colon cancer Brother 66  . Lung cancer Sister   . Lung cancer Brother   . Stomach cancer Brother   .   Diabetes Daughter     Review of Systems: The review of systems is per the HPI.  All other systems were reviewed and are negative.  Physical Exam: BP 142/64  Pulse 58  Ht 5' 1" (1.549 m)  Wt 108 lb 12.8 oz (49.351 kg)  BMI 20.56 kg/m2 Patient is very pleasant and in no acute distress. She is thin. She smells of tobacco. Skin is warm and dry. Color is normal.  HEENT is unremarkable. Normocephalic/atraumatic. PERRL. Sclera are nonicteric. Neck is supple. No masses. No JVD. Lungs are coarse. Cardiac exam shows a regular rate and rhythm. Abdomen is soft. Extremities are without edema. Gait and ROM are intact. No gross neurologic deficits noted.  LABORATORY DATA:  Myoview Impression  Exercise Capacity: Lexiscan with low level exercise.  BP Response: Normal blood pressure response.  Clinical Symptoms: nausea  ECG Impression: No significant ST segment change suggestive of ischemia.  Comparison with Prior Nuclear Study: No images to compare   Overall Impression: Normal stress nuclear study.  LV Ejection  Fraction: 70%. LV Wall Motion: Normal Wall Motion.  Jeffrey Katz, MD  Lab Results  Component Value Date   WBC 7.7 02/11/2012   HGB 11.7* 02/11/2012   HCT 34.5* 02/11/2012   PLT 271 02/11/2012   GLUCOSE 218* 02/11/2012   CHOL 184 02/11/2012   TRIG 287* 02/11/2012   HDL 48 02/11/2012   LDLCALC 79 02/11/2012   ALT 11 02/10/2012   AST 14 02/10/2012   NA 136 02/11/2012   K 4.0 02/11/2012   CL 102 02/11/2012   CREATININE 1.09 02/11/2012   BUN 24* 02/11/2012   CO2 25 02/11/2012   TSH 1.229 02/10/2012   INR 0.91 12/03/2009   HGBA1C 8.9* 02/10/2012    Lab Results  Component Value Date   TROPONINI <0.30 02/11/2012   Dg Chest Portable 1 View  02/10/2012  *RADIOLOGY REPORT*  Clinical Data: Left-sided chest pain  PORTABLE CHEST - 1 VIEW  Comparison: 12/03/2009  Findings: The heart and pulmonary vascularity are within normal limits.  The lungs are clear bilaterally.  No acute bony abnormality is seen.  IMPRESSION: No acute intrathoracic abnormality.   Original Report Authenticated By: Mark Lukens, M.D.    Assessment / Plan: 1. Continued chest pain - known CAD with prior MI in 1999 with 2 stents at that time, subsequent stent 3 to 4 years later. Her myoview was felt to be ok but she continues to have chest pain. Multiple risk factors are present. Diabetes is uncontrolled. She has ongoing tobacco abuse. She is not ready to stop at this time. She is already on PPI therapy. I think we need to proceed on with cardiac cath. Her daughter and she agree. The procedure is set up for Thursday with Dr. Hochrein.   2. Tobacco abuse - not ready to stop  3. Uncontrolled DM - now on insulin.  We will check labs today. Cath is arranged for Thursday in the [facility] lab. Procedure has been reviewed and she is willing to proceed.   Patient is agreeable to this plan and will call if any problems develop in the interim.   

## 2012-02-25 NOTE — Patient Instructions (Addendum)
We need to arrange for a heart catheterization  We need to check labs today.   You are scheduled for an outpatient cardiac catheterization on Thursday, November 21st with Dr. Antoine Poche or associates.  Go the [facility] Center at [facility] on Thursday, November 21st at 11am.  Call the Heart & Vascular Center at [phone] if you are unable to make your appointment.  The code to get into the parking garage under the building is 8000. Then go to the first floor.  You must have someone available to drive you home. Someone needs to be with you for the first 24 hours after you arrive home. Please wear clothes that are easy to get on and off.  Do not eat or drink after midnight on Wednesday. You may have water only with your medications on the morning of your procedure.   May sure you take your aspirin on the day of your procedure.   Do not take the following medicines  - no blood sugar meds on Thursday. Take only half dose of your insulin Wednesday night.     Directions to the Outpatient Cardiac Cath Lab at [facility]:  Please Note:  Park in [place] under the building not the parking deck.  From [employer]: Turn onto [address] Left onto [address] (1st stoplight) Right at the brick entrance to the hospital ([address] circle [address]) Bear to the right and you will see a blue sign "Heart [facility] Vascular Center" Parking garage is a sharp right, to get through the gate put in the code 8000. Once you park, take the elevator to the first floor.  Please do not arrive before 6:30 am.  The building will be dark before that time.  From [employer]: Turn onto [address] Turn left into the brick entrance to the hospital (Main circle drive) [place] to the right and you will see a blue sign "[facility]" Parking garage is a sharp right, to get through the gate put in the code 8000. Once you park, take the elevator to the first floor.  Please do not arrive before 6:30 am.  The  building will be dark before that time.

## 2012-02-26 ENCOUNTER — Other Ambulatory Visit: Payer: Self-pay | Admitting: *Deleted

## 2012-02-26 ENCOUNTER — Other Ambulatory Visit (INDEPENDENT_AMBULATORY_CARE_PROVIDER_SITE_OTHER): Payer: Medicare Other

## 2012-02-26 DIAGNOSIS — D72829 Elevated white blood cell count, unspecified: Secondary | ICD-10-CM

## 2012-02-26 LAB — CBC WITH DIFFERENTIAL/PLATELET
Basophils Absolute: 0 10*3/uL (ref 0.0–0.1)
Basophils Relative: 0.4 % (ref 0.0–3.0)
Eosinophils Absolute: 0.5 10*3/uL (ref 0.0–0.7)
Eosinophils Relative: 5.1 % — ABNORMAL HIGH (ref 0.0–5.0)
HCT: 37.1 % (ref 36.0–46.0)
Hemoglobin: 12.6 g/dL (ref 12.0–15.0)
Lymphocytes Relative: 32.9 % (ref 12.0–46.0)
Lymphs Abs: 3.4 10*3/uL (ref 0.7–4.0)
MCHC: 33.8 g/dL (ref 30.0–36.0)
MCV: 91.6 fl (ref 78.0–100.0)
Monocytes Absolute: 0.6 10*3/uL (ref 0.1–1.0)
Monocytes Relative: 5.6 % (ref 3.0–12.0)
Neutro Abs: 5.8 10*3/uL (ref 1.4–7.7)
Neutrophils Relative %: 56 % (ref 43.0–77.0)
Platelets: 310 10*3/uL (ref 150.0–400.0)
RBC: 4.06 Mil/uL (ref 3.87–5.11)
RDW: 13.5 % (ref 11.5–14.6)
WBC: 10.4 10*3/uL (ref 4.5–10.5)

## 2012-02-27 ENCOUNTER — Inpatient Hospital Stay (HOSPITAL_BASED_OUTPATIENT_CLINIC_OR_DEPARTMENT_OTHER)
Admission: RE | Admit: 2012-02-27 | Discharge: 2012-02-27 | Disposition: A | Discharge status: Home / Self Care | Payer: Medicare Other | Source: Ambulatory Visit | Attending: Cardiology | Admitting: Cardiology

## 2012-02-27 ENCOUNTER — Encounter (HOSPITAL_BASED_OUTPATIENT_CLINIC_OR_DEPARTMENT_OTHER)
Admission: RE | Disposition: A | Discharge status: Home / Self Care | Payer: Self-pay | Source: Ambulatory Visit | Attending: Cardiology

## 2012-02-27 DIAGNOSIS — Z7902 Long term (current) use of antithrombotics/antiplatelets: Secondary | ICD-10-CM | POA: Insufficient documentation

## 2012-02-27 DIAGNOSIS — I2510 Atherosclerotic heart disease of native coronary artery without angina pectoris: Secondary | ICD-10-CM

## 2012-02-27 DIAGNOSIS — Z01810 Encounter for preprocedural cardiovascular examination: Secondary | ICD-10-CM

## 2012-02-27 DIAGNOSIS — Z7982 Long term (current) use of aspirin: Secondary | ICD-10-CM | POA: Insufficient documentation

## 2012-02-27 DIAGNOSIS — I10 Essential (primary) hypertension: Secondary | ICD-10-CM | POA: Insufficient documentation

## 2012-02-27 DIAGNOSIS — Z79899 Other long term (current) drug therapy: Secondary | ICD-10-CM | POA: Insufficient documentation

## 2012-02-27 DIAGNOSIS — K219 Gastro-esophageal reflux disease without esophagitis: Secondary | ICD-10-CM | POA: Insufficient documentation

## 2012-02-27 DIAGNOSIS — E785 Hyperlipidemia, unspecified: Secondary | ICD-10-CM | POA: Insufficient documentation

## 2012-02-27 DIAGNOSIS — F329 Major depressive disorder, single episode, unspecified: Secondary | ICD-10-CM | POA: Insufficient documentation

## 2012-02-27 DIAGNOSIS — F17200 Nicotine dependence, unspecified, uncomplicated: Secondary | ICD-10-CM | POA: Insufficient documentation

## 2012-02-27 DIAGNOSIS — M1990 Unspecified osteoarthritis, unspecified site: Secondary | ICD-10-CM | POA: Insufficient documentation

## 2012-02-27 DIAGNOSIS — Z9861 Coronary angioplasty status: Secondary | ICD-10-CM | POA: Insufficient documentation

## 2012-02-27 DIAGNOSIS — F3289 Other specified depressive episodes: Secondary | ICD-10-CM | POA: Insufficient documentation

## 2012-02-27 DIAGNOSIS — E119 Type 2 diabetes mellitus without complications: Secondary | ICD-10-CM | POA: Insufficient documentation

## 2012-02-27 LAB — POCT I-STAT GLUCOSE: Operator id: 274661

## 2012-02-27 SURGERY — JV LEFT HEART CATHETERIZATION WITH CORONARY ANGIOGRAM
Anesthesia: Moderate Sedation

## 2012-02-27 MED ORDER — DIAZEPAM 5 MG PO TABS
5.0000 mg | ORAL_TABLET | ORAL | Status: AC
  Administered 2012-02-27: 5 mg via ORAL

## 2012-02-27 MED ORDER — ACETAMINOPHEN 325 MG PO TABS: 650.0000 mg | ORAL_TABLET | ORAL | Status: DC | PRN

## 2012-02-27 MED ORDER — SODIUM CHLORIDE 0.9 % IV SOLN: INTRAVENOUS | Status: DC

## 2012-02-27 MED ORDER — ASPIRIN 81 MG PO CHEW
324.0000 mg | CHEWABLE_TABLET | ORAL | Status: AC
  Administered 2012-02-27: 243 mg via ORAL

## 2012-02-27 MED ORDER — ONDANSETRON HCL 4 MG/2ML IJ SOLN: 4.0000 mg | Freq: Four times a day (QID) | INTRAMUSCULAR | Status: DC | PRN

## 2012-02-27 MED ORDER — SODIUM CHLORIDE 0.9 % IJ SOLN: 3.0000 mL | INTRAMUSCULAR | Status: DC | PRN

## 2012-02-27 MED ORDER — SODIUM CHLORIDE 0.9 % IJ SOLN: 3.0000 mL | Freq: Two times a day (BID) | INTRAMUSCULAR | Status: DC

## 2012-02-27 MED ORDER — SODIUM CHLORIDE 0.9 % IV SOLN: 1.0000 mL/kg/h | INTRAVENOUS | Status: DC

## 2012-02-27 MED ORDER — SODIUM CHLORIDE 0.9 % IV SOLN: 250.0000 mL | INTRAVENOUS | Status: DC | PRN

## 2012-02-27 NOTE — Interval H&P Note (Signed)
History and Physical Interval Note:  02/27/2012 1:01 PM  Katherine Cole  has presented today for surgery, with the diagnosis of chest pain  The various methods of treatment have been discussed with the patient and family. After consideration of risks, benefits and other options for treatment, the patient has consented to  Procedure(s) (LRB) with comments: [facility] LEFT HEART CATHETERIZATION WITH CORONARY ANGIOGRAM (N/A) as a surgical intervention .  The patient's history has been reviewed, patient examined, no change in status, stable for surgery.  I have reviewed the patient's chart and labs.  Questions were answered to the patient's satisfaction.     Rollene Rotunda

## 2012-02-27 NOTE — H&P (View-Only) (Signed)
 Katherine Cole Date of Birth: 01/13/1941 Medical Record #[id number]  History of Present Illness: Katherine Cole is seen today for a post hospital visit. She is seen for Dr. Hochrein. She is a former patient of Dr. Tennant's. Has known CAD with prior PCI with 3 stents reported. Other issues include polyps, GERD, HLD, OA, depression, HTN, DM and ongoing tobacco abuse.   She comes back today. She is here with her daughter. She continues to have chest pain. It is daily. Starts in the left chest. Radiates up to the neck. Will last for a couple of minutes. Has used NTG with relief. Continues to smoke. A1C is over 9. Now on insulin. Already on PPI therapy.   Current Outpatient Prescriptions on File Prior to Visit  Medication Sig Dispense Refill  . acetaminophen (TYLENOL) 325 MG tablet Take 650 mg by mouth every 6 (six) hours as needed. For pain      . aspirin 81 MG EC tablet Take 1 tablet (81 mg total) by mouth daily.      . atorvastatin (LIPITOR) 80 MG tablet Take 80 mg by mouth daily.      . esomeprazole (NEXIUM) 40 MG capsule Take 40 mg by mouth daily as needed. For acid reflux      . glimepiride (AMARYL) 1 MG tablet Take 1 mg by mouth daily before breakfast.      . ibuprofen (ADVIL,MOTRIN) 200 MG tablet Take 400 mg by mouth every 6 (six) hours as needed. For pain      . insulin glargine (LANTUS) 100 UNIT/ML injection Inject 10 Units into the skin at bedtime.      . losartan (COZAAR) 100 MG tablet Take 100 mg by mouth daily.      . meloxicam (MOBIC) 15 MG tablet Take 15 mg by mouth daily.      . metFORMIN (GLUCOPHAGE) 500 MG tablet Take 1,000 mg by mouth 2 (two) times daily with a meal.      . metoprolol succinate (TOPROL-XL) 50 MG 24 hr tablet Take 50 mg by mouth daily. Take with or immediately following a meal.      . nitroGLYCERIN (NITROSTAT) 0.4 MG SL tablet Place 0.4 mg under the tongue every 5 (five) minutes as needed.        Allergies  Allergen Reactions  . Codeine Other (See Comments)   Head ache   . Morphine And Related Other (See Comments)    headaches    Past Medical History  Diagnosis Date  . Colon polyps     adenomatous  . GERD (gastroesophageal reflux disease)   . CAD (coronary artery disease)     s/p PCI x 3 stents; details not available; negative Myoview 11/13  . MI (myocardial infarction)   . Hyperlipemia   . Osteoarthritis   . Depression   . Tobacco abuse   . Hypertension   . Diabetes mellitus, type 2     Past Surgical History  Procedure Date  . Tonsillectomy   . Appendectomy   . Coronary stent placement 1999, 2001    3 stents    History  Smoking status  . Current Every Day Smoker -- 0.5 packs/day  . Types: Cigarettes  Smokeless tobacco  . Never Used    History  Alcohol Use No    Family History  Problem Relation Age of Onset  . Colon cancer Brother 66  . Lung cancer Sister   . Lung cancer Brother   . Stomach cancer Brother   .   Diabetes Daughter     Review of Systems: The review of systems is per the HPI.  All other systems were reviewed and are negative.  Physical Exam: BP 142/64  Pulse 58  Ht 5' 1" (1.549 m)  Wt 108 lb 12.8 oz (49.351 kg)  BMI 20.56 kg/m2 Patient is very pleasant and in no acute distress. She is thin. She smells of tobacco. Skin is warm and dry. Color is normal.  HEENT is unremarkable. Normocephalic/atraumatic. PERRL. Sclera are nonicteric. Neck is supple. No masses. No JVD. Lungs are coarse. Cardiac exam shows a regular rate and rhythm. Abdomen is soft. Extremities are without edema. Gait and ROM are intact. No gross neurologic deficits noted.  LABORATORY DATA:  Myoview Impression  Exercise Capacity: Lexiscan with low level exercise.  BP Response: Normal blood pressure response.  Clinical Symptoms: nausea  ECG Impression: No significant ST segment change suggestive of ischemia.  Comparison with Prior Nuclear Study: No images to compare   Overall Impression: Normal stress nuclear study.  LV Ejection  Fraction: 70%. LV Wall Motion: Normal Wall Motion.  Jeffrey Katz, MD  Lab Results  Component Value Date   WBC 7.7 02/11/2012   HGB 11.7* 02/11/2012   HCT 34.5* 02/11/2012   PLT 271 02/11/2012   GLUCOSE 218* 02/11/2012   CHOL 184 02/11/2012   TRIG 287* 02/11/2012   HDL 48 02/11/2012   LDLCALC 79 02/11/2012   ALT 11 02/10/2012   AST 14 02/10/2012   NA 136 02/11/2012   K 4.0 02/11/2012   CL 102 02/11/2012   CREATININE 1.09 02/11/2012   BUN 24* 02/11/2012   CO2 25 02/11/2012   TSH 1.229 02/10/2012   INR 0.91 12/03/2009   HGBA1C 8.9* 02/10/2012    Lab Results  Component Value Date   TROPONINI <0.30 02/11/2012   Dg Chest Portable 1 View  02/10/2012  *RADIOLOGY REPORT*  Clinical Data: Left-sided chest pain  PORTABLE CHEST - 1 VIEW  Comparison: 12/03/2009  Findings: The heart and pulmonary vascularity are within normal limits.  The lungs are clear bilaterally.  No acute bony abnormality is seen.  IMPRESSION: No acute intrathoracic abnormality.   Original Report Authenticated By: Mark Lukens, M.D.    Assessment / Plan: 1. Continued chest pain - known CAD with prior MI in 1999 with 2 stents at that time, subsequent stent 3 to 4 years later. Her myoview was felt to be ok but she continues to have chest pain. Multiple risk factors are present. Diabetes is uncontrolled. She has ongoing tobacco abuse. She is not ready to stop at this time. She is already on PPI therapy. I think we need to proceed on with cardiac cath. Her daughter and she agree. The procedure is set up for Thursday with Dr. Hochrein.   2. Tobacco abuse - not ready to stop  3. Uncontrolled DM - now on insulin.  We will check labs today. Cath is arranged for Thursday in the [facility] lab. Procedure has been reviewed and she is willing to proceed.   Patient is agreeable to this plan and will call if any problems develop in the interim.   

## 2012-02-27 NOTE — OR Nursing (Signed)
Tegaderm dressing applied, site level 0, bedrest begins at 1350 

## 2012-02-27 NOTE — OR Nursing (Signed)
Dr Hochrein at bedside to discuss results and treatment plan with pt and family 

## 2012-02-27 NOTE — CV Procedure (Signed)
  Cardiac Catheterization Procedure Note  Name: Katherine Cole MRN: [id number] DOB: 1940-07-08  Procedure: Left Heart Cath, Selective Coronary Angiography, LV angiography  Indication:  Chest pain suggestive of unstable angina  Procedural details: The right groin was prepped, draped, and anesthetized with 1% lidocaine. Using modified Seldinger technique, a 5 French sheath was introduced into the right femoral artery. Standard Judkins catheters were used for coronary angiography and left ventriculography. Catheter exchanges were performed over a guidewire. There were no immediate procedural complications. The patient was transferred to the post catheterization recovery area for further monitoring.  Procedural Findings:   Hemodynamics:     AO 134/85    LV 134/14   Coronary angiography:   Coronary dominance: Right  Left mainstem:   Normal.  Left anterior descending (LAD):   Large and wraps the apex.  Proximal 25% stenosis.  Mid stent with diffuse 50% narrowing and mid focal 80%.  Distal and apical vessel with mild nonobstructive plaque diffusely.    Left circumflex (LCx):  AV groove proximal stent widely patent with mild luminal irregularities.  MOM moderate sized and branching.  Long 40% stenosis prior to the bifurcation.    Right coronary artery (RCA):  Proximal occlusion with circumflex to PDA collaterals.  Diffuse moderate stenosis before the PDA.  The PDA is underfilled but appears to be small and free of high grade disease.    Left ventriculography: Left ventricular systolic function is normal, LVEF is estimated at 65%, there is no significant mitral regurgitation   Final Conclusions:  Three vessel CAD with occluded RCA, patent circ stent and high grade in stent stenosis in the LAD.  Preserved EF.   Recommendations: I will consider options including CABG.  I will review with the patient and colleagues.    Rollene Rotunda 02/27/2012, 1:36 PM

## 2012-02-27 NOTE — OR Nursing (Signed)
Meal served 

## 2012-03-02 ENCOUNTER — Other Ambulatory Visit: Payer: Self-pay | Admitting: Internal Medicine

## 2012-03-02 DIAGNOSIS — Z1231 Encounter for screening mammogram for malignant neoplasm of breast: Secondary | ICD-10-CM

## 2012-03-02 DIAGNOSIS — Z780 Asymptomatic menopausal state: Secondary | ICD-10-CM

## 2012-03-03 ENCOUNTER — Telehealth: Payer: Self-pay | Admitting: *Deleted

## 2012-03-03 NOTE — Telephone Encounter (Signed)
Called to speak with pt to schedule her for a PCI per Dr Antoine Poche with Dr Excell Seltzer.  Dr Excell Seltzer is in the cath lab 12/4 and 12/5.  Pt is not available at this time.  Will call back

## 2012-03-03 NOTE — Telephone Encounter (Signed)
Left message to call back  

## 2012-03-03 NOTE — Telephone Encounter (Signed)
Left message for pt to call back to schedule.

## 2012-03-03 NOTE — Telephone Encounter (Signed)
Follow- up: ° ° °Patient returned your phone call. Please call back. °

## 2012-03-04 ENCOUNTER — Encounter: Payer: Self-pay | Admitting: *Deleted

## 2012-03-04 ENCOUNTER — Telehealth: Payer: Self-pay | Admitting: *Deleted

## 2012-03-04 NOTE — Telephone Encounter (Signed)
Called pt and reviewed instructions with her.  Of note she was asked to not take Metformin 24 hours before and only take 1/2 her dose of insulin.  She is aware to be NPO and that she will be staying over night.  She is aware to report to the Short Stay Center at [facility] 12/4 at 9:30 am.  She will call back with any questions.

## 2012-03-04 NOTE — Telephone Encounter (Signed)
Pt scheduled for December 4th at 11:30 am with Dr Excell Seltzer. Per Dr Antoine Poche she does not need repeat labwork.  Instructions will be mailed to pt at her home address.

## 2012-03-10 ENCOUNTER — Encounter (HOSPITAL_COMMUNITY): Payer: Self-pay | Admitting: Pharmacy Technician

## 2012-03-11 ENCOUNTER — Encounter (HOSPITAL_COMMUNITY): Payer: Self-pay | Admitting: General Practice

## 2012-03-11 ENCOUNTER — Other Ambulatory Visit: Payer: Self-pay | Admitting: Cardiology

## 2012-03-11 ENCOUNTER — Ambulatory Visit (HOSPITAL_COMMUNITY)
Admission: RE | Admit: 2012-03-11 | Discharge: 2012-03-12 | Disposition: A | Discharge status: Home / Self Care | Payer: Medicare Other | Source: Ambulatory Visit | Attending: Cardiovascular Disease | Admitting: Cardiovascular Disease

## 2012-03-11 ENCOUNTER — Encounter (HOSPITAL_COMMUNITY)
Admission: RE | Disposition: A | Discharge status: Home / Self Care | Payer: Self-pay | Source: Ambulatory Visit | Attending: Cardiovascular Disease

## 2012-03-11 DIAGNOSIS — I209 Angina pectoris, unspecified: Secondary | ICD-10-CM | POA: Insufficient documentation

## 2012-03-11 DIAGNOSIS — Z955 Presence of coronary angioplasty implant and graft: Secondary | ICD-10-CM

## 2012-03-11 DIAGNOSIS — Z9861 Coronary angioplasty status: Secondary | ICD-10-CM | POA: Insufficient documentation

## 2012-03-11 DIAGNOSIS — I2510 Atherosclerotic heart disease of native coronary artery without angina pectoris: Secondary | ICD-10-CM

## 2012-03-11 DIAGNOSIS — F17200 Nicotine dependence, unspecified, uncomplicated: Secondary | ICD-10-CM | POA: Insufficient documentation

## 2012-03-11 DIAGNOSIS — I10 Essential (primary) hypertension: Secondary | ICD-10-CM | POA: Insufficient documentation

## 2012-03-11 DIAGNOSIS — Z79899 Other long term (current) drug therapy: Secondary | ICD-10-CM | POA: Insufficient documentation

## 2012-03-11 DIAGNOSIS — I208 Other forms of angina pectoris: Secondary | ICD-10-CM

## 2012-03-11 DIAGNOSIS — E785 Hyperlipidemia, unspecified: Secondary | ICD-10-CM | POA: Insufficient documentation

## 2012-03-11 DIAGNOSIS — E119 Type 2 diabetes mellitus without complications: Secondary | ICD-10-CM | POA: Insufficient documentation

## 2012-03-11 HISTORY — DX: Unspecified malignant neoplasm of skin, unspecified: C44.90

## 2012-03-11 HISTORY — DX: Unspecified osteoarthritis, unspecified site: M19.90

## 2012-03-11 HISTORY — PX: PERCUTANEOUS CORONARY STENT INTERVENTION (PCI-S): SHX5485

## 2012-03-11 LAB — CBC
HCT: 35.3 % — ABNORMAL LOW (ref 36.0–46.0)
MCV: 90.3 fL (ref 78.0–100.0)
RBC: 3.91 MIL/uL (ref 3.87–5.11)
RDW: 13.6 % (ref 11.5–15.5)
WBC: 8.7 10*3/uL (ref 4.0–10.5)

## 2012-03-11 LAB — BASIC METABOLIC PANEL
CO2: 27 mEq/L (ref 19–32)
Chloride: 104 mEq/L (ref 96–112)
Creatinine, Ser: 0.7 mg/dL (ref 0.50–1.10)
Glucose, Bld: 200 mg/dL — ABNORMAL HIGH (ref 70–99)

## 2012-03-11 LAB — POCT ACTIVATED CLOTTING TIME: Activated Clotting Time: 324 seconds

## 2012-03-11 LAB — GLUCOSE, CAPILLARY

## 2012-03-11 SURGERY — PERCUTANEOUS CORONARY STENT INTERVENTION (PCI-S)
Anesthesia: LOCAL

## 2012-03-11 MED ORDER — ACETAMINOPHEN 325 MG PO TABS
ORAL_TABLET | ORAL | Status: AC
  Filled 2012-03-11: qty 2

## 2012-03-11 MED ORDER — SODIUM CHLORIDE 0.9 % IV SOLN: 1.0000 mL/kg/h | INTRAVENOUS | Status: AC

## 2012-03-11 MED ORDER — ATORVASTATIN CALCIUM 80 MG PO TABS
80.0000 mg | ORAL_TABLET | Freq: Every day | ORAL | Status: DC
  Administered 2012-03-11: 18:00:00 80 mg via ORAL
  Filled 2012-03-11 (×2): qty 1

## 2012-03-11 MED ORDER — INSULIN GLARGINE 100 UNIT/ML ~~LOC~~ SOLN
15.0000 [IU] | Freq: Every day | SUBCUTANEOUS | Status: DC
  Administered 2012-03-11: 22:00:00 15 [IU] via SUBCUTANEOUS

## 2012-03-11 MED ORDER — PANTOPRAZOLE SODIUM 40 MG PO TBEC
40.0000 mg | DELAYED_RELEASE_TABLET | Freq: Every day | ORAL | Status: DC
  Administered 2012-03-11 – 2012-03-12 (×2): 40 mg via ORAL
  Filled 2012-03-11 (×2): qty 1

## 2012-03-11 MED ORDER — SODIUM CHLORIDE 0.9 % IV SOLN: 250.0000 mL | INTRAVENOUS | Status: DC

## 2012-03-11 MED ORDER — CLOPIDOGREL BISULFATE 75 MG PO TABS
75.0000 mg | ORAL_TABLET | Freq: Every day | ORAL | Status: DC
  Administered 2012-03-12: 10:00:00 75 mg via ORAL
  Filled 2012-03-11: qty 1

## 2012-03-11 MED ORDER — MIDAZOLAM HCL 2 MG/2ML IJ SOLN
INTRAMUSCULAR | Status: AC
  Filled 2012-03-11: qty 2

## 2012-03-11 MED ORDER — SODIUM CHLORIDE 0.9 % IJ SOLN: 3.0000 mL | INTRAMUSCULAR | Status: DC | PRN

## 2012-03-11 MED ORDER — NITROGLYCERIN 0.2 MG/ML ON CALL CATH LAB
INTRAVENOUS | Status: AC
  Filled 2012-03-11: qty 1

## 2012-03-11 MED ORDER — ASPIRIN EC 81 MG PO TBEC
81.0000 mg | DELAYED_RELEASE_TABLET | Freq: Every day | ORAL | Status: DC
  Administered 2012-03-12: 81 mg via ORAL
  Filled 2012-03-11: qty 1

## 2012-03-11 MED ORDER — NITROGLYCERIN 0.4 MG SL SUBL: 0.4000 mg | SUBLINGUAL_TABLET | SUBLINGUAL | Status: DC | PRN

## 2012-03-11 MED ORDER — ASPIRIN 81 MG PO CHEW
324.0000 mg | CHEWABLE_TABLET | ORAL | Status: AC
  Administered 2012-03-11: 324 mg via ORAL
  Filled 2012-03-11: qty 4

## 2012-03-11 MED ORDER — ADENOSINE 12 MG/4ML IV SOLN
12.0000 mL | Freq: Once | INTRAVENOUS | Status: DC
  Filled 2012-03-11: qty 12

## 2012-03-11 MED ORDER — TRAMADOL HCL 50 MG PO TABS
50.0000 mg | ORAL_TABLET | Freq: Once | ORAL | Status: AC
  Administered 2012-03-11: 50 mg via ORAL
  Filled 2012-03-11: qty 1

## 2012-03-11 MED ORDER — MELOXICAM 15 MG PO TABS
15.0000 mg | ORAL_TABLET | Freq: Every day | ORAL | Status: DC
  Administered 2012-03-11 – 2012-03-12 (×2): 15 mg via ORAL
  Filled 2012-03-11 (×2): qty 1

## 2012-03-11 MED ORDER — SODIUM CHLORIDE 0.9 % IJ SOLN
3.0000 mL | Freq: Two times a day (BID) | INTRAMUSCULAR | Status: DC
  Administered 2012-03-11: 22:00:00 3 mL via INTRAVENOUS

## 2012-03-11 MED ORDER — CLOPIDOGREL BISULFATE 75 MG PO TABS
75.0000 mg | ORAL_TABLET | Freq: Once | ORAL | Status: AC
  Administered 2012-03-11: 75 mg via ORAL
  Filled 2012-03-11: qty 1

## 2012-03-11 MED ORDER — SODIUM CHLORIDE 0.9 % IJ SOLN: 3.0000 mL | Freq: Two times a day (BID) | INTRAMUSCULAR | Status: DC

## 2012-03-11 MED ORDER — LIDOCAINE HCL (PF) 1 % IJ SOLN
INTRAMUSCULAR | Status: AC
  Filled 2012-03-11: qty 30

## 2012-03-11 MED ORDER — HEPARIN (PORCINE) IN NACL 2-0.9 UNIT/ML-% IJ SOLN
INTRAMUSCULAR | Status: AC
  Filled 2012-03-11: qty 1000

## 2012-03-11 MED ORDER — SODIUM CHLORIDE 0.9 % IV SOLN: 250.0000 mL | INTRAVENOUS | Status: DC | PRN

## 2012-03-11 MED ORDER — METOPROLOL SUCCINATE ER 50 MG PO TB24
50.0000 mg | ORAL_TABLET | Freq: Every day | ORAL | Status: DC
  Administered 2012-03-12: 50 mg via ORAL
  Filled 2012-03-11 (×2): qty 1

## 2012-03-11 MED ORDER — LOSARTAN POTASSIUM 50 MG PO TABS
100.0000 mg | ORAL_TABLET | Freq: Every day | ORAL | Status: DC
  Administered 2012-03-11 – 2012-03-12 (×2): 100 mg via ORAL
  Filled 2012-03-11 (×2): qty 2

## 2012-03-11 MED ORDER — BIVALIRUDIN 250 MG IV SOLR
INTRAVENOUS | Status: AC
  Filled 2012-03-11: qty 250

## 2012-03-11 MED ORDER — FENTANYL CITRATE 0.05 MG/ML IJ SOLN
INTRAMUSCULAR | Status: AC
  Filled 2012-03-11: qty 2

## 2012-03-11 MED ORDER — ASPIRIN EC 81 MG PO TBEC: 81.0000 mg | DELAYED_RELEASE_TABLET | Freq: Every day | ORAL | Status: DC

## 2012-03-11 MED ORDER — ONDANSETRON HCL 4 MG/2ML IJ SOLN: 4.0000 mg | Freq: Four times a day (QID) | INTRAMUSCULAR | Status: DC | PRN

## 2012-03-11 MED ORDER — ACETAMINOPHEN 325 MG PO TABS
650.0000 mg | ORAL_TABLET | ORAL | Status: DC | PRN
  Administered 2012-03-11: 650 mg via ORAL

## 2012-03-11 MED ORDER — ACETAMINOPHEN 325 MG PO TABS: 650.0000 mg | ORAL_TABLET | Freq: Four times a day (QID) | ORAL | Status: DC | PRN

## 2012-03-11 MED ORDER — SODIUM CHLORIDE 0.9 % IV SOLN
INTRAVENOUS | Status: DC
  Administered 2012-03-11: 75 mL/h via INTRAVENOUS

## 2012-03-11 NOTE — H&P (View-Only) (Signed)
Katherine Cole Date of Birth: [birth year] Medical Record #[id number]  History of Present Illness: Katherine Cole is seen today for a post hospital visit. She is seen for Dr. Antoine Poche. She is a former patient of Dr. Ronnald Nian. Has known CAD with prior PCI with 3 stents reported. Other issues include polyps, GERD, HLD, OA, depression, HTN, DM and ongoing tobacco abuse.   She comes back today. She is here with her daughter. She continues to have chest pain. It is daily. Starts in the left chest. Radiates up to the neck. Will last for a couple of minutes. Has used NTG with relief. Continues to smoke. A1C is over 9. Now on insulin. Already on PPI therapy.   Current Outpatient Prescriptions on File Prior to Visit  Medication Sig Dispense Refill  . acetaminophen (TYLENOL) 325 MG tablet Take 650 mg by mouth every 6 (six) hours as needed. For pain      . aspirin 81 MG EC tablet Take 1 tablet (81 mg total) by mouth daily.      Marland Kitchen atorvastatin (LIPITOR) 80 MG tablet Take 80 mg by mouth daily.      Marland Kitchen esomeprazole (NEXIUM) 40 MG capsule Take 40 mg by mouth daily as needed. For acid reflux      . glimepiride (AMARYL) 1 MG tablet Take 1 mg by mouth daily before breakfast.      . ibuprofen (ADVIL,MOTRIN) 200 MG tablet Take 400 mg by mouth every 6 (six) hours as needed. For pain      . insulin glargine (LANTUS) 100 UNIT/ML injection Inject 10 Units into the skin at bedtime.      Marland Kitchen losartan (COZAAR) 100 MG tablet Take 100 mg by mouth daily.      . meloxicam (MOBIC) 15 MG tablet Take 15 mg by mouth daily.      . metFORMIN (GLUCOPHAGE) 500 MG tablet Take 1,000 mg by mouth 2 (two) times daily with a meal.      . metoprolol succinate (TOPROL-XL) 50 MG 24 hr tablet Take 50 mg by mouth daily. Take with or immediately following a meal.      . nitroGLYCERIN (NITROSTAT) 0.4 MG SL tablet Place 0.4 mg under the tongue every 5 (five) minutes as needed.        Allergies  Allergen Reactions  . Codeine Other (See Comments)   Head ache   . Morphine And Related Other (See Comments)    headaches    Past Medical History  Diagnosis Date  . Colon polyps     adenomatous  . GERD (gastroesophageal reflux disease)   . CAD (coronary artery disease)     s/p PCI x 3 stents; details not available; negative Myoview 11/13  . MI (myocardial infarction)   . Hyperlipemia   . Osteoarthritis   . Depression   . Tobacco abuse   . Hypertension   . Diabetes mellitus, type 2     Past Surgical History  Procedure Date  . Tonsillectomy   . Appendectomy   . Coronary stent placement 1999, 2001    3 stents    History  Smoking status  . Current Every Day Smoker -- 0.5 packs/day  . Types: Cigarettes  Smokeless tobacco  . Never Used    History  Alcohol Use No    Family History  Problem Relation Age of Onset  . Colon cancer Brother 54  . Lung cancer Sister   . Lung cancer Brother   . Stomach cancer Brother   .  Diabetes Daughter     Review of Systems: The review of systems is per the HPI.  All other systems were reviewed and are negative.  Physical Exam: BP 142/64  Pulse 58  Ht 5\' 1"  (1.549 m)  Wt 108 lb 12.8 oz (49.351 kg)  BMI 20.56 kg/m2 Patient is very pleasant and in no acute distress. She is thin. She smells of tobacco. Skin is warm and dry. Color is normal.  HEENT is unremarkable. Normocephalic/atraumatic. PERRL. Sclera are nonicteric. Neck is supple. No masses. No JVD. Lungs are coarse. Cardiac exam shows a regular rate and rhythm. Abdomen is soft. Extremities are without edema. Gait and ROM are intact. No gross neurologic deficits noted.  LABORATORY DATA:  Myoview Impression  Exercise Capacity: Lexiscan with low level exercise.  BP Response: Normal blood pressure response.  Clinical Symptoms: nausea  ECG Impression: No significant ST segment change suggestive of ischemia.  Comparison with Prior Nuclear Study: No images to compare   Overall Impression: Normal stress nuclear study.  LV Ejection  Fraction: 70%. LV Wall Motion: Normal Wall Motion.  Willa Rough, MD  Lab Results  Component Value Date   WBC 7.7 02/11/2012   HGB 11.7* 02/11/2012   HCT 34.5* 02/11/2012   PLT 271 02/11/2012   GLUCOSE 218* 02/11/2012   CHOL 184 02/11/2012   TRIG 287* 02/11/2012   HDL 48 02/11/2012   LDLCALC 79 02/11/2012   ALT 11 02/10/2012   AST 14 02/10/2012   NA 136 02/11/2012   K 4.0 02/11/2012   CL 102 02/11/2012   CREATININE 1.09 02/11/2012   BUN 24* 02/11/2012   CO2 25 02/11/2012   TSH 1.229 02/10/2012   INR 0.91 12/03/2009   HGBA1C 8.9* 02/10/2012    Lab Results  Component Value Date   TROPONINI <0.30 02/11/2012   Dg Chest Portable 1 View  02/10/2012  *RADIOLOGY REPORT*  Clinical Data: Left-sided chest pain  PORTABLE CHEST - 1 VIEW  Comparison: 12/03/2009  Findings: The heart and pulmonary vascularity are within normal limits.  The lungs are clear bilaterally.  No acute bony abnormality is seen.  IMPRESSION: No acute intrathoracic abnormality.   Original Report Authenticated By: Alcide Clever, M.D.    Assessment / Plan: 1. Continued chest pain - known CAD with prior MI in 1999 with 2 stents at that time, subsequent stent 3 to 4 years later. Her myoview was felt to be ok but she continues to have chest pain. Multiple risk factors are present. Diabetes is uncontrolled. She has ongoing tobacco abuse. She is not ready to stop at this time. She is already on PPI therapy. I think we need to proceed on with cardiac cath. Her daughter and she agree. The procedure is set up for Thursday with Dr. Antoine Poche.   2. Tobacco abuse - not ready to stop  3. Uncontrolled DM - now on insulin.  We will check labs today. Cath is arranged for Thursday in the [facility] lab. Procedure has been reviewed and she is willing to proceed.   Patient is agreeable to this plan and will call if any problems develop in the interim.

## 2012-03-11 NOTE — CV Procedure (Signed)
   CARDIAC CATH NOTE  Name: Katherine Cole MRN: [id number] DOB: 12/18/1940  Procedure: Pressure wire analysis, PTCA, and stenting of the LAD  Indication: 71 year old woman with known CAD. She has undergone multivessel stenting in the past. She underwent recent cardiac catheterization for evaluation of progressive anginal symptoms. She is having anginal symptoms at rest and with exertion. Her cardiac catheterization showed chronic total occlusion of the right coronary artery with left-to-right collaterals. The LAD had moderately tight stenosis of 75% within a previously stented segment. The left circumflex had a widely patent stent. She was brought in today for pressure wire analysis of the LAD because of progressive anginal symptoms, now CCS class III.  Procedural Details: The right groin was prepped, draped, and anesthetized with 1% lidocaine. Using the modified Seldinger technique, a 6 Fr sheath was introduced into the right femoral artery.  Weight-based bivalirudin was given for anticoagulation. Once a therapeutic ACT was achieved, a 6 [place] XB LAD 3.5 cm guide catheter was inserted.  This guide was too long so we changed out to a 6 [place] XB LAD 3.0 cm guide. A prime wire coronary guidewire was used to cross the lesion.  Intravenous adenosine was administered and the FFR at peak hyperemia was 0.75. A pullback was done and demonstrated the pressure gradient across the restenotic area within the stent. The lesion was predilated with a 2.5 x 15 mm balloon.  The lesion was then stented with a 2.5 x 20 mm Promus Premier drug-eluting stent.  The stent was postdilated with a 2.75 x  15 mm noncompliant balloon to a maximum pressure of 16 atmospheres.  Following PCI, there was 0% residual stenosis and TIMI-3 flow. Final angiography confirmed an excellent result. The patient tolerated the procedure well. There were no immediate procedural complications. Femoral hemostasis was achieved with manual hemostasis. The  patient was transferred to the post catheterization recovery area for further monitoring.  Lesion Data: Vessel: LAD/mid Percent stenosis (pre): 75 TIMI-flow (pre):  3 Stent:  2.5 x 20 mm drug-eluting Percent stenosis (post): 0 TIMI-flow (post): 3  Conclusions: Successful PCI of the LAD with guidance of pressure wire analysis.  Recommendations: Continue dual antiplatelet therapy with aspirin Plavix, tobacco cessation, and continued medical therapy for CAD.  Tonny Bollman 03/11/2012, 3:19 PM

## 2012-03-11 NOTE — Interval H&P Note (Signed)
History and Physical Interval Note:  03/11/2012 1:04 PM  Katherine Cole  has presented today for surgery, with the diagnosis of CAD  The various methods of treatment have been discussed with the patient and family. After consideration of risks, benefits and other options for treatment, the patient has consented to  Procedure(s) (LRB) with comments: PERCUTANEOUS CORONARY STENT INTERVENTION (PCI-S) (N/A) as a surgical intervention .  The patient's history has been reviewed, patient examined, no change in status, stable for surgery.  I have reviewed the patient's chart and labs.  Questions were answered to the patient's satisfaction.    Pt is s/p diagnostic cath last week. This demonstrated CTO of the RCA with collaterals and moderate LAD stenosis. Plan today for FFR of the LAD +- PCI. Would favor med Rx of the RCA as it is not favorable for PCI. Pt understands plan.  Tonny Bollman

## 2012-03-11 NOTE — Progress Notes (Signed)
Site area: right groin  Site Prior to Removal:  Level 0  Pressure Applied For 20 MINUTES    Minutes Beginning at 1625  Manual:   yes  Patient Status During Pull:  AAO X3  Post Pull Groin Site:  Level 0  Post Pull Instructions Given:  yes  Post Pull Pulses Present:  yes  Dressing Applied:  yes  Comments:  Tolerated procedure well

## 2012-03-11 NOTE — Progress Notes (Signed)
Called in to pt's room . Rt groin site rebled, pressure hold done x10 mins to obtaine hemostasis  with success. V/S stable. Kept pt monitored.

## 2012-03-11 NOTE — Progress Notes (Signed)
Utilization Review Completed.   Shelvy Perazzo, RN, BSN Nurse Case Manager  [phone]  

## 2012-03-12 ENCOUNTER — Encounter (HOSPITAL_COMMUNITY): Payer: Self-pay | Admitting: Physician Assistant

## 2012-03-12 DIAGNOSIS — I208 Other forms of angina pectoris: Secondary | ICD-10-CM

## 2012-03-12 DIAGNOSIS — I209 Angina pectoris, unspecified: Secondary | ICD-10-CM

## 2012-03-12 LAB — BASIC METABOLIC PANEL
BUN: 22 mg/dL (ref 6–23)
CO2: 25 mEq/L (ref 19–32)
Calcium: 8.8 mg/dL (ref 8.4–10.5)
Chloride: 105 mEq/L (ref 96–112)
Creatinine, Ser: 0.88 mg/dL (ref 0.50–1.10)
GFR calc Af Amer: 76 mL/min — ABNORMAL LOW (ref >90)

## 2012-03-12 LAB — CBC
HCT: 32.5 % — ABNORMAL LOW (ref 36.0–46.0)
MCH: 29.9 pg (ref 26.0–34.0)
MCV: 89.3 fL (ref 78.0–100.0)
Platelets: 279 10*3/uL (ref 150–400)
RDW: 13.5 % (ref 11.5–15.5)

## 2012-03-12 MED ORDER — IBUPROFEN 200 MG PO TABS: 400.0000 mg | ORAL_TABLET | Freq: Four times a day (QID) | ORAL | Status: DC | PRN

## 2012-03-12 MED ORDER — PANTOPRAZOLE SODIUM 40 MG PO TBEC: 40.0000 mg | DELAYED_RELEASE_TABLET | Freq: Every day | ORAL | Status: DC

## 2012-03-12 MED ORDER — MELOXICAM 15 MG PO TABS: 15.0000 mg | ORAL_TABLET | Freq: Every day | ORAL | Status: DC

## 2012-03-12 MED ORDER — NITROGLYCERIN 0.4 MG SL SUBL: 0.4000 mg | SUBLINGUAL_TABLET | SUBLINGUAL | Status: AC | PRN

## 2012-03-12 MED ORDER — METFORMIN HCL 500 MG PO TABS: 1000.0000 mg | ORAL_TABLET | Freq: Two times a day (BID) | ORAL | Status: DC

## 2012-03-12 MED FILL — Dextrose Inj 5%: INTRAVENOUS | Qty: 50 | Status: AC

## 2012-03-12 NOTE — Progress Notes (Signed)
CARDIAC REHAB PHASE I   PRE:  Rate/Rhythm: 66 SR    BP: sitting 144/80    SaO2:   MODE:  Ambulation: 800 ft   POST:  Rate/Rhythm: 77 SR    BP: sitting 149/65     SaO2:   Tolerated well, no CP. Ed completed with daughter present. Pt voiced understanding and sts she is thinking about quitting smoking. Gave resources. Interested in [facility] and requests her name be sent to [facility].  [phone]  Harriet Masson CES, ACSM

## 2012-03-12 NOTE — Discharge Summary (Signed)
Discharge Summary   Patient ID: TYIA BINFORD MRN: [id number], DOB/AGE: 1941-02-08 71 y.o. Admit date: 03/11/2012 D/C date:     03/12/2012  Primary Cardiologist: Antoine Poche, former [facility]  Primary Discharge Diagnoses:  1. Coronary artery disease with CCS class III angina - s/p stenting of LAD guided by pressure wire 03/11/12 - history includes prior MI in 1999 with 2 stents at that time, subsequent stent 3 to 4 years later 2. Diabetes mellitus - recent A1C 8.9 so recently uncontrolled 3. Hyperlipidemia 4. HTN  Secondary Discharge Diagnoses:  1. GERD 2. Polyps 3. Depression 4. Tobacco abuse 5. OA 6. Skin cancer   Hospital Course: Ms. Dragon is a 71 y/o F with history of CAD with prior PCI with 3 stents reported, as well as polyps, GERD, HLD, OA, depression, HTN, DM and ongoing tobacco abuse. She was seen in the office in November complaining of chest pain starting in the left chest, radiating up to the neck. NTG provided some relief. She underwent diagnostic cardiac cath 02/27/12 demonstrating three vessel CAD with occluded RCA with L-R collaterals, patent circ stent and high grade in stent stenosis in the LAD with preserved EF. Dr. Antoine Poche reviewed her case with colleagues in regards to CABG versus PCI. Ultimately she was brought in yesterday for pressure wire analysis of the LAD and underwent successful PCI with DES to the LAD. Dual antiplatelet therapy with aspirin/Plavix (for at least 12 months - on this prior to admission), tobacco cessation, and continued medical therapy for CAD as well as diabetes were recommended. Her Hgb was slightly down today likely dilutional (OP 11.5-13). She denies any source of bleeding including melena. She was educated to notify her doctor if she does experience this. Per discussion with Dr. Excell Seltzer, we will recheck CBC at her f/u appointment. She was instructed to talk to PCP about alternatives to Mobic and only to use ibuprofen sparingly. Nexium was changed to  Protonix due to Plavix use. Today the patient is doing well. Dr. Excell Seltzer has seen and examined her and feels she is stable for discharge.  Discharge Vitals: Blood pressure 144/80, pulse 63, temperature 98.2 F (36.8 C), temperature source Oral, resp. rate 28, height 5\' 1"  (1.549 m), weight 110 lb 14.3 oz (50.3 kg), SpO2 100.00%.  Labs: Lab Results  Component Value Date   WBC 8.7 03/12/2012   HGB 10.9* 03/12/2012   HCT 32.5* 03/12/2012   MCV 89.3 03/12/2012   PLT 279 03/12/2012     Lab 03/12/12 0520  NA 138  K 3.9  CL 105  CO2 25  BUN 22  CREATININE 0.88  CALCIUM 8.8  PROT --  BILITOT --  ALKPHOS --  ALT --  AST --  GLUCOSE 125*    Lab Results  Component Value Date   CHOL 184 02/11/2012   HDL 48 02/11/2012   LDLCALC 79 02/11/2012   TRIG 287* 02/11/2012    Diagnostic Studies/Procedures   1. Cardiac catheterization this admission, please see full report and above for summary.  Discharge Medications   Current Discharge Medication List    START taking these medications   Details  pantoprazole (PROTONIX) 40 MG tablet Take 1 tablet (40 mg total) by mouth daily. Qty: 30 tablet, Refills: 1   Comments: Some studies suggest that Nexium may interact with Plavix. Take this medicine instead, Protonix, for less chance of interaction. Additional refills should come from your primary care doctor to determine if you still need this.  CONTINUE these medications which have CHANGED   Details  ibuprofen (ADVIL,MOTRIN) 200 MG tablet Take 2 tablets (400 mg total) by mouth every 6 (six) hours as needed. For pain. Only use sparingly given your heart disease and also due to risk of stomach bleeding while you are on aspirin/Plavix.    meloxicam (MOBIC) 15 MG tablet Take 1 tablet (15 mg total) by mouth daily.   Comments: This medicine can increase risk of stomach bleeding while taking medicines like aspirin and Plavix and may also have cardiovascular risks. Talk to your primary care doctor  about alternatives. Notify your doctor immediately if you develop any blood in stool, stomach upset or black tarry stools.    metFORMIN (GLUCOPHAGE) 500 MG tablet Take 2 tablets (1,000 mg total) by mouth 2 (two) times daily with a meal.   Comments: !!!!!!!!!!!!!!!!!!!!!!!!!!! Do not resume until the evening of 03/13/12.    nitroGLYCERIN (NITROSTAT) 0.4 MG SL tablet Place 1 tablet (0.4 mg total) under the tongue every 5 (five) minutes as needed (up to 3 doses). For chest pain      CONTINUE these medications which have NOT CHANGED   Details  acetaminophen (TYLENOL) 325 MG tablet Take 650 mg by mouth every 6 (six) hours as needed. For pain    aspirin 81 MG EC tablet Take 1 tablet (81 mg total) by mouth daily.    atorvastatin (LIPITOR) 80 MG tablet Take 80 mg by mouth daily.    clopidogrel (PLAVIX) 75 MG tablet Take 75 mg by mouth daily.    insulin glargine (LANTUS) 100 UNIT/ML injection Inject 15 Units into the skin at bedtime.     losartan (COZAAR) 100 MG tablet Take 100 mg by mouth daily.    metoprolol succinate (TOPROL-XL) 50 MG 24 hr tablet Take 50 mg by mouth daily. Take with or immediately following a meal.      STOP taking these medications     esomeprazole (NEXIUM) 40 MG capsule Comments:  Reason for Stopping:       glimepiride (AMARYL) 1 MG tablet Comments:  Reason for Stopping: recently taken off by PCP           Disposition   The patient will be discharged in stable condition to home. Discharge Orders    Future Appointments: Provider: Department: Dept Phone: Center:   03/25/2012 11:45 AM Rosalio Macadamia, NP [facility] Main Office [place]) [phone] [facility]   03/25/2012 12:15 PM [facility] Main Office [place]) [phone] [facility]   04/10/2012 2:00 PM [facility] Dexa 1 [facility] - [phone] [facility] [phone] [facility]   04/10/2012 2:30 PM [facility] Mm [facility] [place]  [facility]AG[facility] [phone] [facility]      Future Orders Please Complete By Expires   Amb Referral to Cardiac Rehabilitation      Diet - low sodium heart healthy      Comments:   Diabetic Diet   Increase activity slowly      Comments:   No driving for 2 days. No lifting over 5 lbs for 1 week. No sexual activity for 1 week. Keep procedure site clean & dry. If you notice increased pain, swelling, bleeding or pus, call/return!  You may shower, but no soaking baths/hot tubs/pools for 1 week.     Follow-up Information    Follow up with Norma Fredrickson, NP. (03/25/12 at 11:45am. You will also have labwork that day.)    Contact information:   [address] [place]  [address] [phone]       Follow up with Hoyle Sauer, MD. (Your diabetes was recently uncontrolled by the labwork you had in November (A1C 8.9%) - please make sure to follow closely with your doctor.)    Contact information:   [address] [facility] [place] [address] [phone]            Duration of Discharge Encounter: Greater than 30 minutes including physician and PA time.  Signed, Ronie Spies PA-C 03/12/2012, 11:41 AM

## 2012-03-12 NOTE — Progress Notes (Signed)
    Subjective:  No chest pain or dyspnea. Patient slept well.  Objective:  Vital Signs in the last 24 hours: Temp:  [97.7 F (36.5 C)-98.4 F (36.9 C)] 98.2 F (36.8 C) (12/05 0840) Pulse Rate:  [52-63] 63  (12/05 0840) Resp:  [12-28] 28  (12/05 0840) BP: (118-144)/(49-80) 144/80 mmHg (12/05 0840) SpO2:  [100 %] 100 % (12/05 0840) Weight:  [110 lb 14.3 oz (50.3 kg)] 110 lb 14.3 oz (50.3 kg) (12/05 0425)  Intake/Output from previous day: 12/04 0701 - 12/05 0700 In: 480 [P.O.:480] Out: 250 [Urine:250]  Physical Exam: Pt is alert and oriented, NAD HEENT: normal Neck: JVP - normal Lungs: CTA bilaterally CV: RRR without murmur or gallop Abd: soft, NT, Positive BS, no hepatomegaly Ext: no C/C/E, distal pulses intact and equal, right groin site is clear Skin: warm/dry no rash   Lab Results:  Basename 03/12/12 0520 03/11/12 0939  WBC 8.7 8.7  HGB 10.9* 12.0  PLT 279 307    Basename 03/12/12 0520 03/11/12 0939  NA 138 140  K 3.9 4.3  CL 105 104  CO2 25 27  GLUCOSE 125* 200*  BUN 22 19  CREATININE 0.88 0.70   No results found for this basename: TROPONINI:2,CK,MB:2 in the last 72 hours  Cardiac Studies: EKG: Sinus bradycardia, otherwise within normal limits  Tele: Sinus rhythm without significant arrhythmia, personally reviewed.  Assessment/Plan:  1. 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 Cardiac catheterization and PCI yesterday with stenting of the LAD guided by pressure wire. The patient should continue on aspirin and Plavix together for at least 12 months. She will require ongoing risk factor modification with tobacco cessation, better control of diabetes, and continue treatment of her hypertension. Will arrange followup with Norma Fredrickson in 2 weeks. We discussed tobacco cessation yesterday and she is not interested in quitting at this time.  2. Type 2 diabetes. Continue treatment per primary care physician  3. Hyperlipidemia. The patient is on  high-dose atorvastatin  4. Hypertension. Continue losartan and Toprol-XL  Disposition: Home today, discussed the importance of medication adherence with dual antiplatelet therapy, followup in 2 weeks as above.  Tonny Bollman, M.D. 03/12/2012, 9:32 AM

## 2012-03-24 ENCOUNTER — Other Ambulatory Visit: Payer: Self-pay | Admitting: *Deleted

## 2012-03-24 DIAGNOSIS — D649 Anemia, unspecified: Secondary | ICD-10-CM

## 2012-03-25 ENCOUNTER — Ambulatory Visit (INDEPENDENT_AMBULATORY_CARE_PROVIDER_SITE_OTHER): Payer: Medicare Other | Admitting: Nurse Practitioner

## 2012-03-25 ENCOUNTER — Other Ambulatory Visit (INDEPENDENT_AMBULATORY_CARE_PROVIDER_SITE_OTHER): Payer: Medicare Other

## 2012-03-25 ENCOUNTER — Encounter: Payer: Self-pay | Admitting: Nurse Practitioner

## 2012-03-25 VITALS — BP 130/58 | HR 60 | Ht 61.0 in | Wt 108.0 lb

## 2012-03-25 DIAGNOSIS — D649 Anemia, unspecified: Secondary | ICD-10-CM

## 2012-03-25 DIAGNOSIS — I259 Chronic ischemic heart disease, unspecified: Secondary | ICD-10-CM

## 2012-03-25 LAB — CBC WITH DIFFERENTIAL/PLATELET
Basophils Absolute: 0.1 10*3/uL (ref 0.0–0.1)
Basophils Relative: 0.5 % (ref 0.0–3.0)
Eosinophils Absolute: 0.2 10*3/uL (ref 0.0–0.7)
Eosinophils Relative: 1.4 % (ref 0.0–5.0)
HCT: 37.4 % (ref 36.0–46.0)
Hemoglobin: 12.6 g/dL (ref 12.0–15.0)
Lymphocytes Relative: 32 % (ref 12.0–46.0)
Lymphs Abs: 4.2 10*3/uL — ABNORMAL HIGH (ref 0.7–4.0)
MCHC: 33.6 g/dL (ref 30.0–36.0)
MCV: 90.2 fl (ref 78.0–100.0)
Monocytes Absolute: 0.8 10*3/uL (ref 0.1–1.0)
Monocytes Relative: 6.1 % (ref 3.0–12.0)
Neutro Abs: 7.8 10*3/uL — ABNORMAL HIGH (ref 1.4–7.7)
Neutrophils Relative %: 60 % (ref 43.0–77.0)
Platelets: 330 10*3/uL (ref 150.0–400.0)
RBC: 4.15 Mil/uL (ref 3.87–5.11)
RDW: 13.5 % (ref 11.5–14.6)
WBC: 13 10*3/uL — ABNORMAL HIGH (ref 4.5–10.5)

## 2012-03-25 NOTE — Patient Instructions (Addendum)
Stay on your current medicines  We are checking labs today  If you have more chest pain, we need to know about it. We will then to add more medicines versus repeat procedure.  Keep working on your smoking  Ok to start cardiac rehab. I will send them a note.  Call the [facility] office at [phone] if you have any questions, problems or concerns.

## 2012-03-25 NOTE — Progress Notes (Signed)
Everlene Balls Date of Birth: July 12, 1940 Medical Record #[id number]  History of Present Illness: Avila is seen back today for a post hospital visit. She is seen for Dr. Antoine Poche. She was a former patient of Dr. Ronnald Nian. Has known CAD with prior PCI with 3 stents reported. Has GERD, HLD, OA, depression, HTN, DM and ongoing tobacco abuse.  I saw her a month ago. She had had a normal Myoview. Still very symptomatic with chest pain and was referred for cath.  She underwent cardiac cath and subsequent repeat PCI with DES to the LAD guided by pressure wire earlier this month. She remains on Plavix. There was discussion about proceeding with CABG but she was able to have successful PCI. EF is normal.  She comes in today. She is here alone. She is doing ok. Has been home for about 2 weeks. No chest pain until this past Sunday. Was sitting and had chest pain that lasted for about 5 minutes. Felt like her heart pain. No NTG used. It resolved without intervention and has not returned. She is still smoking but says it is less. Hesitant to increase her activity level. She is considering going to cardiac rehab. Not short of breath. Says her sugars are coming down. She is tolerating her medicines. She does note that NTG tends to give her a significant headache.   Current Outpatient Prescriptions on File Prior to Visit  Medication Sig Dispense Refill  . acetaminophen (TYLENOL) 325 MG tablet Take 650 mg by mouth every 6 (six) hours as needed. For pain      . aspirin 81 MG EC tablet Take 1 tablet (81 mg total) by mouth daily.      Marland Kitchen atorvastatin (LIPITOR) 80 MG tablet Take 80 mg by mouth daily.      . clopidogrel (PLAVIX) 75 MG tablet Take 75 mg by mouth daily.      Marland Kitchen escitalopram (LEXAPRO) 10 MG tablet Take 10 mg by mouth daily.       Marland Kitchen ibuprofen (ADVIL,MOTRIN) 200 MG tablet Take 2 tablets (400 mg total) by mouth every 6 (six) hours as needed. For pain. Only use sparingly given your heart disease and also due  to risk of stomach bleeding while you are on aspirin/Plavix.      Marland Kitchen insulin glargine (LANTUS) 100 UNIT/ML injection Inject 15 Units into the skin at bedtime.       Marland Kitchen losartan (COZAAR) 100 MG tablet Take 100 mg by mouth daily.      . meloxicam (MOBIC) 15 MG tablet Take 1 tablet (15 mg total) by mouth daily.      . metFORMIN (GLUCOPHAGE) 500 MG tablet Take 2 tablets (1,000 mg total) by mouth 2 (two) times daily with a meal.      . metoprolol succinate (TOPROL-XL) 50 MG 24 hr tablet Take 50 mg by mouth daily. Take with or immediately following a meal.      . nitroGLYCERIN (NITROSTAT) 0.4 MG SL tablet Place 1 tablet (0.4 mg total) under the tongue every 5 (five) minutes as needed (up to 3 doses). For chest pain      . pantoprazole (PROTONIX) 40 MG tablet Take 1 tablet (40 mg total) by mouth daily.  30 tablet  1    Allergies  Allergen Reactions  . Codeine Other (See Comments)    "gives me a migraine" (03/11/2012)  . Morphine And Related Other (See Comments)    "gives me a migraine" (03/11/2012)    Past Medical History  Diagnosis Date  . Colon polyps     adenomatous  . GERD (gastroesophageal reflux disease)   . Hyperlipemia   . Depression   . Hypertension   . Skin cancer 2012    "nose; right middle finger"  . CAD (coronary artery disease)     a. MI 1999 with 2 stents at that time followed by stenting 3-4 yrs later. b. s/p DES to LAD guided by pressure wire 03/11/12.  . Diabetes mellitus, type 2   . Osteoarthritis   . Arthritis     "in my fingers"     Past Surgical History  Procedure Date  . Coronary angioplasty with stent placement 1999; 2004; 03/11/2012    "2 + 1 + 1; total of 4" (03/11/2012)  . Appendectomy ~ 1965  . Tonsillectomy and adenoidectomy ~ 1962  . Femur fracture surgery 2011    RLE (03/11/2012)  . Tubal ligation 1977  . Skin cancer excision 2013    "nose & right middle finger; actinic keratosis" (03/11/2012)    History  Smoking status  . Current Every Day Smoker --  1.0 packs/day for 51 years  . Types: Cigarettes  Smokeless tobacco  . Never Used    History  Alcohol Use No    Family History  Problem Relation Age of Onset  . Colon cancer Brother 16  . Lung cancer Sister   . Lung cancer Brother   . Stomach cancer Brother   . Diabetes Daughter     Review of Systems: The review of systems is per the HPI.  All other systems were reviewed and are negative.  Physical Exam: BP 130/58  Pulse 60  Ht 5\' 1"  (1.549 m)  Wt 108 lb (48.988 kg)  BMI 20.41 kg/m2 Patient is very pleasant and in no acute distress. She is thin. Skin is warm and dry. Color is normal.  HEENT is unremarkable. Normocephalic/atraumatic. PERRL. Sclera are nonicteric. Neck is supple. No masses. No JVD. Lungs are clear. Cardiac exam shows a regular rate and rhythm. Abdomen is soft. Extremities are without edema. Gait and ROM are intact. No gross neurologic deficits noted.   LABORATORY DATA:   Coronary angiography:   Left mainstem: Normal.  Left anterior descending (LAD): Large and wraps the apex. Proximal 25% stenosis. Mid stent with diffuse 50% narrowing and mid focal 80%. Distal and apical vessel with mild nonobstructive plaque diffusely.  Left circumflex (LCx): AV groove proximal stent widely patent with mild luminal irregularities. MOM moderate sized and branching. Long 40% stenosis prior to the bifurcation.  Right coronary artery (RCA): Proximal occlusion with circumflex to PDA collaterals. Diffuse moderate stenosis before the PDA. The PDA is underfilled but appears to be small and free of high grade disease.  Left ventriculography: Left ventricular systolic function is normal, LVEF is estimated at 65%, there is no significant mitral regurgitation   Final Conclusions: Three vessel CAD with occluded RCA, patent circ stent and high grade in stent stenosis in the LAD. Preserved EF.   Recommendations: I will consider options including CABG. I will review with the patient and  colleagues.  Rollene Rotunda  02/27/2012, 1:36 PM   PCI Lesion Data:  Vessel: LAD/mid  Percent stenosis (pre): 75  TIMI-flow (pre): 3  Stent: 2.5 x 20 mm drug-eluting  Percent stenosis (post): 0  TIMI-flow (post): 3   Conclusions: Successful PCI of the LAD with guidance of pressure wire analysis.   Recommendations: Continue dual antiplatelet therapy with aspirin Plavix, tobacco cessation, and continued medical  therapy for CAD.  Tonny Bollman  03/11/2012, 3:19 PM  Lab Results  Component Value Date   WBC 8.7 03/12/2012   HGB 10.9* 03/12/2012   HCT 32.5* 03/12/2012   PLT 279 03/12/2012   GLUCOSE 125* 03/12/2012   CHOL 184 02/11/2012   TRIG 287* 02/11/2012   HDL 48 02/11/2012   LDLCALC 79 02/11/2012   ALT 11 02/10/2012   AST 14 02/10/2012   NA 138 03/12/2012   K 3.9 03/12/2012   CL 105 03/12/2012   CREATININE 0.88 03/12/2012   BUN 22 03/12/2012   CO2 25 03/12/2012   TSH 1.229 02/10/2012   INR 1.0 02/25/2012   HGBA1C 8.9* 02/10/2012    Assessment / Plan: 1. CAD - recent DES to the LAD guided by pressure wire - remains on Plavix - has had one spell of chest pain. Reminded to use her NTG prn. May have to add Imdur but will probably not tolerate. She is to let us know if she has recurrence. Will tentatively see her back in a month.  2. Tobacco abuse - she says she is smoking less. Cessation is encouraged.  3. HLD  4. Anemia - rechecking today.   5. DM - to see Dr. Felipa Eth.  We are rechecking her labs today. See her back in a month unless she has more symptoms.   Patient is agreeable to this plan and will call if any problems develop in the interim.

## 2012-04-03 ENCOUNTER — Telehealth: Payer: Self-pay | Admitting: Cardiology

## 2012-04-03 NOTE — Telephone Encounter (Signed)
Received  FMLA from [facility]. Dr. Antoine Poche is out of the office until 1.7.13. Placed in his box for review and sign upon return...Marland Kitchendjc

## 2012-04-03 NOTE — Telephone Encounter (Signed)
Correction Dr. Antoine Poche returns to the office on 12.31.13...Marland Kitchendjc

## 2012-04-10 ENCOUNTER — Ambulatory Visit
Admission: RE | Admit: 2012-04-10 | Discharge: 2012-04-10 | Disposition: A | Discharge status: Home / Self Care | Payer: Medicare Other | Source: Ambulatory Visit | Attending: Internal Medicine | Admitting: Internal Medicine

## 2012-04-10 DIAGNOSIS — Z780 Asymptomatic menopausal state: Secondary | ICD-10-CM

## 2012-04-10 DIAGNOSIS — Z1231 Encounter for screening mammogram for malignant neoplasm of breast: Secondary | ICD-10-CM

## 2012-04-16 ENCOUNTER — Other Ambulatory Visit: Payer: Self-pay | Admitting: Internal Medicine

## 2012-04-16 DIAGNOSIS — R928 Other abnormal and inconclusive findings on diagnostic imaging of breast: Secondary | ICD-10-CM

## 2012-04-20 ENCOUNTER — Encounter: Payer: Self-pay | Admitting: Cardiology

## 2012-04-22 ENCOUNTER — Encounter: Payer: Self-pay | Admitting: Nurse Practitioner

## 2012-04-22 ENCOUNTER — Ambulatory Visit (INDEPENDENT_AMBULATORY_CARE_PROVIDER_SITE_OTHER): Payer: Medicare Other | Admitting: Nurse Practitioner

## 2012-04-22 VITALS — BP 148/62 | HR 60 | Ht 61.0 in | Wt 110.8 lb

## 2012-04-22 DIAGNOSIS — I259 Chronic ischemic heart disease, unspecified: Secondary | ICD-10-CM

## 2012-04-22 NOTE — Progress Notes (Signed)
Katherine Cole Date of Birth: 02/11/1941 Medical Record #[id number]  History of Present Illness: Katherine Cole is seen back today for a 3 week check. She is seen for Dr. Antoine Poche. She has knlown CAD with prior PCI and recent DES to the LAD in December of 2013. Other issues include GERD, HLD, OA, depression, HTN, DM and ongoing tobacco abuse.   I saw her 3 weeks ago. She was improved. She had had just one spell of chest pain.   She comes in today. She is here alone. She is doing ok. No more chest pain. Tolerating her medicines. Trying to stop smoking by tapering. Under more stress. Several family members have gotten sick and/or died. She has been called back for a repeat mammogram on Friday. She is worried. Insulin has been increased by her PCP.   Current Outpatient Prescriptions on File Prior to Visit  Medication Sig Dispense Refill  . acetaminophen (TYLENOL) 325 MG tablet Take 650 mg by mouth every 6 (six) hours as needed. For pain      . aspirin 81 MG EC tablet Take 1 tablet (81 mg total) by mouth daily.      Marland Kitchen atorvastatin (LIPITOR) 80 MG tablet Take 80 mg by mouth daily.      . clopidogrel (PLAVIX) 75 MG tablet Take 75 mg by mouth daily.      Marland Kitchen escitalopram (LEXAPRO) 10 MG tablet Take 10 mg by mouth daily.       Marland Kitchen ibuprofen (ADVIL,MOTRIN) 200 MG tablet Take 2 tablets (400 mg total) by mouth every 6 (six) hours as needed. For pain. Only use sparingly given your heart disease and also due to risk of stomach bleeding while you are on aspirin/Plavix.      Marland Kitchen insulin glargine (LANTUS) 100 UNIT/ML injection Inject 18 Units into the skin at bedtime.       Marland Kitchen LORazepam (ATIVAN) 2 MG tablet Take 2 mg by mouth every 6 (six) hours as needed.       Marland Kitchen losartan (COZAAR) 100 MG tablet Take 100 mg by mouth daily.      . meloxicam (MOBIC) 15 MG tablet Take 1 tablet (15 mg total) by mouth daily.      . metFORMIN (GLUCOPHAGE) 500 MG tablet Take 2 tablets (1,000 mg total) by mouth 2 (two) times daily with a meal.       . metoprolol succinate (TOPROL-XL) 50 MG 24 hr tablet Take 50 mg by mouth daily. Take with or immediately following a meal.      . nitroGLYCERIN (NITROSTAT) 0.4 MG SL tablet Place 1 tablet (0.4 mg total) under the tongue every 5 (five) minutes as needed (up to 3 doses). For chest pain      . pantoprazole (PROTONIX) 40 MG tablet Take 1 tablet (40 mg total) by mouth daily.  30 tablet  1    Allergies  Allergen Reactions  . Codeine Other (See Comments)    "gives me a migraine" (03/11/2012)  . Morphine And Related Other (See Comments)    "gives me a migraine" (03/11/2012)    Past Medical History  Diagnosis Date  . Colon polyps     adenomatous  . GERD (gastroesophageal reflux disease)   . Hyperlipemia   . Depression   . Hypertension   . Skin cancer 2012    "nose; right middle finger"  . CAD (coronary artery disease)     a. MI 1999 with 2 stents at that time followed by stenting 3-4 yrs later.  b. s/p DES to LAD guided by pressure wire 03/11/12.  . Diabetes mellitus, type 2   . Osteoarthritis   . Arthritis     "in my fingers"     Past Surgical History  Procedure Date  . Coronary angioplasty with stent placement 1999; 2004; 03/11/2012    "2 + 1 + 1; total of 4" (03/11/2012)  . Appendectomy ~ 1965  . Tonsillectomy and adenoidectomy ~ 1962  . Femur fracture surgery 2011    RLE (03/11/2012)  . Tubal ligation 1977  . Skin cancer excision 2013    "nose & right middle finger; actinic keratosis" (03/11/2012)    History  Smoking status  . Current Every Day Smoker -- 1.0 packs/day for 51 years  . Types: Cigarettes  Smokeless tobacco  . Never Used    History  Alcohol Use No    Family History  Problem Relation Age of Onset  . Colon cancer Brother 35  . Lung cancer Sister   . Lung cancer Brother   . Stomach cancer Brother   . Diabetes Daughter     Review of Systems: The review of systems is per the HPI.  All other systems were reviewed and are negative.  Physical Exam: BP  148/62  Pulse 60  Ht 5\' 1"  (1.549 m)  Wt 110 lb 12.8 oz (50.259 kg)  BMI 20.94 kg/m2 Patient is very pleasant and in no acute distress. Skin is warm and dry. Color is normal.  HEENT is unremarkable. Normocephalic/atraumatic. PERRL. Sclera are nonicteric. Neck is supple. No masses. No JVD. Lungs are clear. Cardiac exam shows a regular rate and rhythm. Abdomen is soft. Extremities are without edema. Gait and ROM are intact. No gross neurologic deficits noted.   LABORATORY DATA: Lab Results  Component Value Date   WBC 13.0* 03/25/2012   HGB 12.6 03/25/2012   HCT 37.4 03/25/2012   PLT 330.0 03/25/2012   GLUCOSE 125* 03/12/2012   CHOL 184 02/11/2012   TRIG 287* 02/11/2012   HDL 48 02/11/2012   LDLCALC 79 02/11/2012   ALT 11 02/10/2012   AST 14 02/10/2012   NA 138 03/12/2012   K 3.9 03/12/2012   CL 105 03/12/2012   CREATININE 0.88 03/12/2012   BUN 22 03/12/2012   CO2 25 03/12/2012   TSH 1.229 02/10/2012   INR 1.0 02/25/2012   HGBA1C 8.9* 02/10/2012     Assessment / Plan: 1. CAD with recent DES to the LAD guided by pressure wire - on Plavix. No further chest pain. Smoking cessation encouraged.   2. HTN - blood pressure up some today. I have asked her to monitor at home and call us if it remains elevated. She feels like it is more related to stress at this time.   3. Tobacco abuse - cessation is encouraged.   4. Abnormal mammogram - for repeat study later this week. Hopefully she will not require biopsy. Plavix should not be interrupted at this time.  We will see her back in about 3 months.   Patient is agreeable to this plan and will call if any problems develop in the interim.

## 2012-04-22 NOTE — Patient Instructions (Addendum)
Monitor your blood pressure at home. Let us know if it stays above 140/90  For now, stay on your current medicines  Dr. Antoine Poche will see you in 3 months  Keep working on stopping your smoking  Call the [facility] office at [phone] if you have any questions, problems or concerns.

## 2012-04-24 ENCOUNTER — Ambulatory Visit
Admission: RE | Admit: 2012-04-24 | Discharge: 2012-04-24 | Disposition: A | Discharge status: Home / Self Care | Payer: Medicare Other | Source: Ambulatory Visit | Attending: Internal Medicine | Admitting: Internal Medicine

## 2012-04-24 DIAGNOSIS — R928 Other abnormal and inconclusive findings on diagnostic imaging of breast: Secondary | ICD-10-CM

## 2012-08-13 ENCOUNTER — Ambulatory Visit: Payer: Medicare Other | Admitting: Cardiology

## 2012-09-22 ENCOUNTER — Encounter: Payer: Self-pay | Admitting: Cardiology

## 2013-08-20 ENCOUNTER — Ambulatory Visit (INDEPENDENT_AMBULATORY_CARE_PROVIDER_SITE_OTHER): Payer: Medicare Other | Admitting: Cardiology

## 2013-08-20 ENCOUNTER — Encounter: Payer: Self-pay | Admitting: Cardiology

## 2013-08-20 VITALS — BP 160/60 | HR 52 | Ht 61.0 in | Wt 110.0 lb

## 2013-08-20 DIAGNOSIS — I10 Essential (primary) hypertension: Secondary | ICD-10-CM

## 2013-08-20 DIAGNOSIS — I219 Acute myocardial infarction, unspecified: Secondary | ICD-10-CM

## 2013-08-20 DIAGNOSIS — I2510 Atherosclerotic heart disease of native coronary artery without angina pectoris: Secondary | ICD-10-CM

## 2013-08-20 NOTE — Progress Notes (Signed)
HPI  The patient presents for evaluation of CAD with prior PCI and DES to the LAD in December of 2013. Since I last saw her she has done well.  The patient denies any new symptoms such as chest discomfort, neck or arm discomfort. There has been no new shortness of breath, PND or orthopnea. There have been no reported palpitations, presyncope or syncope.  She unfortunately is still smoking.   Allergies  Allergen Reactions  . Codeine Other (See Comments)    "gives me a migraine" (03/11/2012)  . Morphine And Related Other (See Comments)    "gives me a migraine" (03/11/2012)    Current Outpatient Prescriptions  Medication Sig Dispense Refill  . acetaminophen (TYLENOL) 325 MG tablet Take 650 mg by mouth every 6 (six) hours as needed. For pain      . aspirin 81 MG EC tablet Take 1 tablet (81 mg total) by mouth daily.      Marland Kitchen atorvastatin (LIPITOR) 80 MG tablet Take 80 mg by mouth daily.      . clopidogrel (PLAVIX) 75 MG tablet Take 75 mg by mouth daily.      Marland Kitchen escitalopram (LEXAPRO) 10 MG tablet Take 10 mg by mouth daily.       Marland Kitchen ibuprofen (ADVIL,MOTRIN) 200 MG tablet Take 2 tablets (400 mg total) by mouth every 6 (six) hours as needed. For pain. Only use sparingly given your heart disease and also due to risk of stomach bleeding while you are on aspirin/Plavix.      Marland Kitchen insulin glargine (LANTUS) 100 UNIT/ML injection Inject 18 Units into the skin at bedtime.       Marland Kitchen LORazepam (ATIVAN) 2 MG tablet Take 2 mg by mouth every 6 (six) hours as needed.       Marland Kitchen losartan (COZAAR) 100 MG tablet Take 100 mg by mouth daily.      . meloxicam (MOBIC) 15 MG tablet Take 1 tablet (15 mg total) by mouth daily.      . metFORMIN (GLUCOPHAGE) 500 MG tablet Take 2 tablets (1,000 mg total) by mouth 2 (two) times daily with a meal.      . metoprolol succinate (TOPROL-XL) 50 MG 24 hr tablet Take 50 mg by mouth daily. Take with or immediately following a meal.      . nitroGLYCERIN (NITROSTAT) 0.4 MG SL tablet Place 1  tablet (0.4 mg total) under the tongue every 5 (five) minutes as needed (up to 3 doses). For chest pain      . pantoprazole (PROTONIX) 40 MG tablet Take 1 tablet (40 mg total) by mouth daily.  30 tablet  1   No current facility-administered medications for this visit.    Past Medical History  Diagnosis Date  . Colon polyps     adenomatous  . GERD (gastroesophageal reflux disease)   . Hyperlipemia   . Depression   . Hypertension   . Skin cancer 2012    "nose; right middle finger"  . CAD (coronary artery disease)     a. MI 1999 with 2 stents at that time followed by stenting 3-4 yrs later. b. s/p DES to LAD guided by pressure wire 03/11/12.  . Diabetes mellitus, type 2   . Osteoarthritis   . Arthritis     "in my fingers"     Past Surgical History  Procedure Laterality Date  . Coronary angioplasty with stent placement  1999; 2004; 03/11/2012    "2 + 1 + 1; total of 4" (  03/11/2012)  . Appendectomy  ~ 1965  . Tonsillectomy and adenoidectomy  ~ 1962  . Femur fracture surgery  2011    RLE (03/11/2012)  . Tubal ligation  1977  . Skin cancer excision  2013    "nose & right middle finger; actinic keratosis" (03/11/2012)    ROS:  As stated in the HPI and negative for all other systems.  PHYSICAL EXAM BP 160/60  Pulse 52  Ht 5\' 1"  (1.549 m)  Wt 110 lb (49.896 kg)  BMI 20.80 kg/m2 GENERAL:  Well appearing HEENT:  Pupils equal round and reactive, fundi not visualized, oral mucosa unremarkable NECK:  No jugular venous distention, waveform within normal limits, carotid upstroke brisk and symmetric, no bruits, no thyromegaly LYMPHATICS:  No cervical, inguinal adenopathy LUNGS:  Clear to auscultation bilaterally BACK:  No CVA tenderness CHEST:  Unremarkable HEART:  PMI not displaced or sustained,S1 and S2 within normal limits, no S3, no S4, no clicks, no rubs, no murmurs ABD:  Flat, positive bowel sounds normal in frequency in pitch, no bruits, no rebound, no guarding, no midline  pulsatile mass, no hepatomegaly, no splenomegaly EXT:  2 plus pulses throughout, no edema, no cyanosis no clubbing SKIN:  No rashes no nodules NEURO:  Cranial nerves II through XII grossly intact, motor grossly intact throughout PSYCH:  Cognitively intact, oriented to person place and time  EKG:  Sinus rhythm, rate 52, axis within normal limits, intervals within normal limits, no acute ST-T wave changes.  08/20/2013   ASSESSMENT AND PLAN  CAD:  The patient has no new sypmtoms.  No further cardiovascular testing is indicated.  We will continue with aggressive risk reduction and meds as listed.  TOBACCO ABUSE:  We have discussed this multiple times.    DYSLIPIDEMIA:  I will defer to [facility] R, MD.  She reports that this has been checked recently.   Marland Kitchen

## 2013-08-20 NOTE — Patient Instructions (Signed)
The current medical regimen is effective;  continue present plan and medications.  Follow up in 1 year with Dr Hochrein.  You will receive a letter in the mail 2 months before you are due.  Please call us when you receive this letter to schedule your follow up appointment.  

## 2013-09-28 ENCOUNTER — Other Ambulatory Visit: Payer: Self-pay | Admitting: Internal Medicine

## 2013-09-28 DIAGNOSIS — Z1231 Encounter for screening mammogram for malignant neoplasm of breast: Secondary | ICD-10-CM

## 2013-10-14 ENCOUNTER — Encounter (INDEPENDENT_AMBULATORY_CARE_PROVIDER_SITE_OTHER): Payer: Self-pay

## 2013-10-14 ENCOUNTER — Ambulatory Visit
Admission: RE | Admit: 2013-10-14 | Discharge: 2013-10-14 | Disposition: A | Discharge status: Home / Self Care | Payer: Medicare Other | Source: Ambulatory Visit | Attending: Internal Medicine | Admitting: Internal Medicine

## 2013-10-14 DIAGNOSIS — Z1231 Encounter for screening mammogram for malignant neoplasm of breast: Secondary | ICD-10-CM

## 2014-01-20 ENCOUNTER — Encounter (HOSPITAL_COMMUNITY): Payer: Self-pay | Admitting: Emergency Medicine

## 2014-01-20 ENCOUNTER — Inpatient Hospital Stay (HOSPITAL_COMMUNITY): Payer: Medicare Other

## 2014-01-20 ENCOUNTER — Emergency Department (HOSPITAL_COMMUNITY): Payer: Medicare Other

## 2014-01-20 ENCOUNTER — Inpatient Hospital Stay (HOSPITAL_COMMUNITY)
Admission: EM | Admit: 2014-01-20 | Discharge: 2014-01-22 | DRG: 066 | Disposition: A | Discharge status: Home / Self Care | Payer: Medicare Other | Attending: Internal Medicine | Admitting: Internal Medicine

## 2014-01-20 DIAGNOSIS — Z833 Family history of diabetes mellitus: Secondary | ICD-10-CM | POA: Diagnosis not present

## 2014-01-20 DIAGNOSIS — E1151 Type 2 diabetes mellitus with diabetic peripheral angiopathy without gangrene: Secondary | ICD-10-CM | POA: Diagnosis present

## 2014-01-20 DIAGNOSIS — R200 Anesthesia of skin: Secondary | ICD-10-CM | POA: Diagnosis not present

## 2014-01-20 DIAGNOSIS — Z7902 Long term (current) use of antithrombotics/antiplatelets: Secondary | ICD-10-CM

## 2014-01-20 DIAGNOSIS — Z955 Presence of coronary angioplasty implant and graft: Secondary | ICD-10-CM | POA: Diagnosis not present

## 2014-01-20 DIAGNOSIS — E785 Hyperlipidemia, unspecified: Secondary | ICD-10-CM

## 2014-01-20 DIAGNOSIS — E1165 Type 2 diabetes mellitus with hyperglycemia: Secondary | ICD-10-CM | POA: Diagnosis present

## 2014-01-20 DIAGNOSIS — E11649 Type 2 diabetes mellitus with hypoglycemia without coma: Secondary | ICD-10-CM | POA: Diagnosis present

## 2014-01-20 DIAGNOSIS — Z801 Family history of malignant neoplasm of trachea, bronchus and lung: Secondary | ICD-10-CM | POA: Diagnosis not present

## 2014-01-20 DIAGNOSIS — M19042 Primary osteoarthritis, left hand: Secondary | ICD-10-CM | POA: Diagnosis present

## 2014-01-20 DIAGNOSIS — Z85828 Personal history of other malignant neoplasm of skin: Secondary | ICD-10-CM | POA: Diagnosis not present

## 2014-01-20 DIAGNOSIS — I639 Cerebral infarction, unspecified: Secondary | ICD-10-CM | POA: Diagnosis not present

## 2014-01-20 DIAGNOSIS — Z7982 Long term (current) use of aspirin: Secondary | ICD-10-CM | POA: Diagnosis not present

## 2014-01-20 DIAGNOSIS — Z9114 Patient's other noncompliance with medication regimen: Secondary | ICD-10-CM | POA: Diagnosis present

## 2014-01-20 DIAGNOSIS — IMO0002 Reserved for concepts with insufficient information to code with codable children: Secondary | ICD-10-CM

## 2014-01-20 DIAGNOSIS — I2510 Atherosclerotic heart disease of native coronary artery without angina pectoris: Secondary | ICD-10-CM | POA: Diagnosis present

## 2014-01-20 DIAGNOSIS — I10 Essential (primary) hypertension: Secondary | ICD-10-CM

## 2014-01-20 DIAGNOSIS — Z791 Long term (current) use of non-steroidal anti-inflammatories (NSAID): Secondary | ICD-10-CM

## 2014-01-20 DIAGNOSIS — F17210 Nicotine dependence, cigarettes, uncomplicated: Secondary | ICD-10-CM | POA: Diagnosis present

## 2014-01-20 DIAGNOSIS — M19041 Primary osteoarthritis, right hand: Secondary | ICD-10-CM | POA: Diagnosis present

## 2014-01-20 DIAGNOSIS — Z885 Allergy status to narcotic agent status: Secondary | ICD-10-CM

## 2014-01-20 DIAGNOSIS — I252 Old myocardial infarction: Secondary | ICD-10-CM

## 2014-01-20 DIAGNOSIS — K219 Gastro-esophageal reflux disease without esophagitis: Secondary | ICD-10-CM | POA: Diagnosis present

## 2014-01-20 DIAGNOSIS — Z794 Long term (current) use of insulin: Secondary | ICD-10-CM | POA: Diagnosis not present

## 2014-01-20 DIAGNOSIS — Z720 Tobacco use: Secondary | ICD-10-CM

## 2014-01-20 DIAGNOSIS — Z800 Family history of malignant neoplasm of digestive organs: Secondary | ICD-10-CM

## 2014-01-20 LAB — CBC
HEMATOCRIT: 39.7 % (ref 36.0–46.0)
HEMOGLOBIN: 13.8 g/dL (ref 12.0–15.0)
MCH: 30 pg (ref 26.0–34.0)
MCHC: 34.8 g/dL (ref 30.0–36.0)
MCV: 86.3 fL (ref 78.0–100.0)
Platelets: 266 10*3/uL (ref 150–400)
RBC: 4.6 MIL/uL (ref 3.87–5.11)
RDW: 12.9 % (ref 11.5–15.5)
WBC: 9.7 10*3/uL (ref 4.0–10.5)

## 2014-01-20 LAB — URINE MICROSCOPIC-ADD ON

## 2014-01-20 LAB — PROTIME-INR
INR: 0.98 (ref 0.00–1.49)
Prothrombin Time: 13.1 seconds (ref 11.6–15.2)

## 2014-01-20 LAB — DIFFERENTIAL
Basophils Absolute: 0 10*3/uL (ref 0.0–0.1)
Basophils Relative: 0 % (ref 0–1)
EOS PCT: 1 % (ref 0–5)
Eosinophils Absolute: 0.1 10*3/uL (ref 0.0–0.7)
LYMPHS PCT: 42 % (ref 12–46)
Lymphs Abs: 4.1 10*3/uL — ABNORMAL HIGH (ref 0.7–4.0)
MONO ABS: 0.7 10*3/uL (ref 0.1–1.0)
MONOS PCT: 7 % (ref 3–12)
NEUTROS ABS: 4.8 10*3/uL (ref 1.7–7.7)
Neutrophils Relative %: 50 % (ref 43–77)

## 2014-01-20 LAB — RAPID URINE DRUG SCREEN, HOSP PERFORMED
Amphetamines: NOT DETECTED
Barbiturates: NOT DETECTED
Benzodiazepines: NOT DETECTED
Cocaine: NOT DETECTED
OPIATES: NOT DETECTED
TETRAHYDROCANNABINOL: NOT DETECTED

## 2014-01-20 LAB — URINALYSIS, ROUTINE W REFLEX MICROSCOPIC
Bilirubin Urine: NEGATIVE
GLUCOSE, UA: NEGATIVE mg/dL
Ketones, ur: NEGATIVE mg/dL
Leukocytes, UA: NEGATIVE
Nitrite: NEGATIVE
PH: 5 (ref 5.0–8.0)
Protein, ur: NEGATIVE mg/dL
SPECIFIC GRAVITY, URINE: 1.021 (ref 1.005–1.030)
UROBILINOGEN UA: 0.2 mg/dL (ref 0.0–1.0)

## 2014-01-20 LAB — I-STAT CHEM 8, ED
BUN: 18 mg/dL (ref 6–23)
CALCIUM ION: 1.2 mmol/L (ref 1.13–1.30)
Chloride: 101 mEq/L (ref 96–112)
Creatinine, Ser: 0.8 mg/dL (ref 0.50–1.10)
Glucose, Bld: 207 mg/dL — ABNORMAL HIGH (ref 70–99)
HCT: 44 % (ref 36.0–46.0)
HEMOGLOBIN: 15 g/dL (ref 12.0–15.0)
Potassium: 4.2 mEq/L (ref 3.7–5.3)
SODIUM: 137 meq/L (ref 137–147)
TCO2: 24 mmol/L (ref 0–100)

## 2014-01-20 LAB — APTT: APTT: 29 s (ref 24–37)

## 2014-01-20 LAB — COMPREHENSIVE METABOLIC PANEL
ALT: 13 U/L (ref 0–35)
ANION GAP: 12 (ref 5–15)
AST: 17 U/L (ref 0–37)
Albumin: 3.8 g/dL (ref 3.5–5.2)
Alkaline Phosphatase: 101 U/L (ref 39–117)
BILIRUBIN TOTAL: 0.4 mg/dL (ref 0.3–1.2)
BUN: 18 mg/dL (ref 6–23)
CHLORIDE: 99 meq/L (ref 96–112)
CO2: 25 meq/L (ref 19–32)
CREATININE: 0.77 mg/dL (ref 0.50–1.10)
Calcium: 9.9 mg/dL (ref 8.4–10.5)
GFR, EST NON AFRICAN AMERICAN: 83 mL/min — AB (ref >90)
GLUCOSE: 204 mg/dL — AB (ref 70–99)
Potassium: 4.3 mEq/L (ref 3.7–5.3)
Sodium: 136 mEq/L — ABNORMAL LOW (ref 137–147)
Total Protein: 7.8 g/dL (ref 6.0–8.3)

## 2014-01-20 LAB — I-STAT TROPONIN, ED: TROPONIN I, POC: 0.02 ng/mL (ref 0.00–0.08)

## 2014-01-20 LAB — ETHANOL

## 2014-01-20 LAB — GLUCOSE, CAPILLARY: Glucose-Capillary: 176 mg/dL — ABNORMAL HIGH (ref 70–99)

## 2014-01-20 MED ORDER — LINAGLIPTIN 5 MG PO TABS
5.0000 mg | ORAL_TABLET | Freq: Every day | ORAL | Status: DC
  Administered 2014-01-21 – 2014-01-22 (×2): 5 mg via ORAL
  Filled 2014-01-20 (×3): qty 1

## 2014-01-20 MED ORDER — ESCITALOPRAM OXALATE 10 MG PO TABS
10.0000 mg | ORAL_TABLET | Freq: Every day | ORAL | Status: DC
  Filled 2014-01-20 (×3): qty 1

## 2014-01-20 MED ORDER — METOPROLOL SUCCINATE ER 50 MG PO TB24
50.0000 mg | ORAL_TABLET | Freq: Every day | ORAL | Status: DC
  Administered 2014-01-21 – 2014-01-22 (×2): 50 mg via ORAL
  Filled 2014-01-20 (×3): qty 1

## 2014-01-20 MED ORDER — INSULIN ASPART 100 UNIT/ML ~~LOC~~ SOLN
0.0000 [IU] | Freq: Every day | SUBCUTANEOUS | Status: DC
  Administered 2014-01-21: 2 [IU] via SUBCUTANEOUS

## 2014-01-20 MED ORDER — PANTOPRAZOLE SODIUM 40 MG PO TBEC
40.0000 mg | DELAYED_RELEASE_TABLET | Freq: Every day | ORAL | Status: DC
  Administered 2014-01-21 – 2014-01-22 (×2): 40 mg via ORAL
  Filled 2014-01-20 (×2): qty 1

## 2014-01-20 MED ORDER — HEPARIN SODIUM (PORCINE) 5000 UNIT/ML IJ SOLN
5000.0000 [IU] | Freq: Three times a day (TID) | INTRAMUSCULAR | Status: DC
  Administered 2014-01-20 – 2014-01-22 (×5): 5000 [IU] via SUBCUTANEOUS
  Filled 2014-01-20 (×8): qty 1

## 2014-01-20 MED ORDER — INSULIN GLARGINE 100 UNIT/ML ~~LOC~~ SOLN
35.0000 [IU] | Freq: Two times a day (BID) | SUBCUTANEOUS | Status: DC
  Administered 2014-01-20 – 2014-01-21 (×2): 35 [IU] via SUBCUTANEOUS
  Filled 2014-01-20 (×3): qty 0.35

## 2014-01-20 MED ORDER — ACETAMINOPHEN 325 MG PO TABS
650.0000 mg | ORAL_TABLET | Freq: Four times a day (QID) | ORAL | Status: DC | PRN
  Administered 2014-01-21: 650 mg via ORAL
  Filled 2014-01-20: qty 2

## 2014-01-20 MED ORDER — CLOPIDOGREL BISULFATE 75 MG PO TABS
75.0000 mg | ORAL_TABLET | Freq: Every day | ORAL | Status: DC
  Administered 2014-01-21 – 2014-01-22 (×2): 75 mg via ORAL
  Filled 2014-01-20 (×2): qty 1

## 2014-01-20 MED ORDER — VARENICLINE TARTRATE 0.5 MG PO TABS: 0.5000 mg | ORAL_TABLET | Freq: Every day | ORAL | Status: DC

## 2014-01-20 MED ORDER — STROKE: EARLY STAGES OF RECOVERY BOOK
Freq: Once | Status: DC
  Filled 2014-01-20: qty 1

## 2014-01-20 MED ORDER — INSULIN ASPART 100 UNIT/ML ~~LOC~~ SOLN
0.0000 [IU] | Freq: Three times a day (TID) | SUBCUTANEOUS | Status: DC
  Administered 2014-01-21: 3 [IU] via SUBCUTANEOUS

## 2014-01-20 MED ORDER — ATORVASTATIN CALCIUM 80 MG PO TABS
80.0000 mg | ORAL_TABLET | Freq: Every day | ORAL | Status: DC
  Administered 2014-01-21 – 2014-01-22 (×2): 80 mg via ORAL
  Filled 2014-01-20 (×3): qty 1

## 2014-01-20 MED ORDER — NITROGLYCERIN 0.4 MG SL SUBL: 0.4000 mg | SUBLINGUAL_TABLET | SUBLINGUAL | Status: DC | PRN

## 2014-01-20 MED ORDER — ASPIRIN 81 MG PO CHEW
81.0000 mg | CHEWABLE_TABLET | Freq: Every day | ORAL | Status: DC
  Administered 2014-01-21 – 2014-01-22 (×2): 81 mg via ORAL
  Filled 2014-01-20 (×2): qty 1

## 2014-01-20 MED ORDER — INSULIN ASPART 100 UNIT/ML ~~LOC~~ SOLN
3.0000 [IU] | Freq: Three times a day (TID) | SUBCUTANEOUS | Status: DC
  Administered 2014-01-21: 3 [IU] via SUBCUTANEOUS

## 2014-01-20 MED ORDER — SENNOSIDES-DOCUSATE SODIUM 8.6-50 MG PO TABS: 1.0000 | ORAL_TABLET | Freq: Every evening | ORAL | Status: DC | PRN

## 2014-01-20 NOTE — Consult Note (Signed)
Referring Physician: Dr. Olevia Bowens    Chief Complaint: left sided numbness   HPI: Katherine Cole is an 73 y.o. female pmh most significant for multiple MI s/p stenting most recent in 12/14, HTN, DM, and HLD who p/w sustained numbness and tingling in her left upper extremity and lower extremity since 1400 on 01/19/14. The pt states that suddenly at 1400 she noticed diffuse numbness and tingling of her left hand and leg but denied any associated weakness, gait abnormality, or aphagia. She lives with her son and grandson who didn't comment on any noticable facial droop or dysarthria. She also denied any CP, SOB, HA, or blurry vision. The patient stated she took a couple of baby aspirin thinking this would help with no avail, which prompted her to present to ED. She denied ever having an event like this in the past and has no known family members with CVA but significant for cardiac disease. She continues to smoke 0.5 ppd and has about a 35 pack year history.   Date last known well: 01/19/14 Time last known well: 1400 tPA Given: No: outside tpa window  Past Medical History  Diagnosis Date  . Colon polyps     adenomatous  . GERD (gastroesophageal reflux disease)   . Hyperlipemia   . Depression   . Hypertension   . Skin cancer 2012    "nose; right middle finger"  . CAD (coronary artery disease)     a. MI 1999 with 2 stents at that time followed by stenting 3-4 yrs later. b. s/p DES to LAD guided by pressure wire 03/11/12.  . Diabetes mellitus, type 2   . Osteoarthritis   . Arthritis     "in my fingers"     Past Surgical History  Procedure Laterality Date  . Coronary angioplasty with stent placement  1999; 2004; 03/11/2012    "2 + 1 + 1; total of 4" (03/11/2012)  . Appendectomy  ~ 1965  . Tonsillectomy and adenoidectomy  ~ 1962  . Femur fracture surgery  2011    RLE (03/11/2012)  . Tubal ligation  1977  . Skin cancer excision  2013    "nose & right middle finger; actinic keratosis" (03/11/2012)     Family History  Problem Relation Age of Onset  . Colon cancer Brother 63  . Lung cancer Sister   . Lung cancer Brother   . Stomach cancer Brother   . Diabetes Daughter    Social History:  reports that she has been smoking Cigarettes.  She has a 51 pack-year smoking history. She has never used smokeless tobacco. She reports that she does not drink alcohol or use illicit drugs.  Allergies:  Allergies  Allergen Reactions  . Codeine Other (See Comments)    "gives me a migraine" (03/11/2012)  . Morphine And Related Other (See Comments)    "gives me a migraine" (03/11/2012)    No current facility-administered medications on file prior to encounter.   Current Outpatient Prescriptions on File Prior to Encounter  Medication Sig Dispense Refill  . acetaminophen (TYLENOL) 325 MG tablet Take 650 mg by mouth every 6 (six) hours as needed. For pain      . atorvastatin (LIPITOR) 80 MG tablet Take 80 mg by mouth daily.      . clopidogrel (PLAVIX) 75 MG tablet Take 75 mg by mouth daily.      Marland Kitchen escitalopram (LEXAPRO) 10 MG tablet Take 10 mg by mouth daily.       Marland Kitchen  insulin glargine (LANTUS) 100 UNIT/ML injection Inject 25 Units into the skin 2 (two) times daily.       . meloxicam (MOBIC) 15 MG tablet Take 1 tablet (15 mg total) by mouth daily.      . metoprolol succinate (TOPROL-XL) 50 MG 24 hr tablet Take 50 mg by mouth daily. Take with or immediately following a meal.      . nitroGLYCERIN (NITROSTAT) 0.4 MG SL tablet Place 1 tablet (0.4 mg total) under the tongue every 5 (five) minutes as needed (up to 3 doses). For chest pain        ROS: complete 14 ROS was completed and relevant points listed in HPI  Physical Examination: Blood pressure 126/89, pulse 58, resp. rate 16, SpO2 98.00%. General: Mental Status: Alert, oriented, thought content appropriate.  Speech fluent without evidence of aphasia.  Able to follow 3 step commands without difficulty. Cranial Nerves: II: Discs flat  bilaterally; Visual fields grossly normal, pupils equal, round, reactive to light and accommodation III,IV, VI: ptosis not present, extra-ocular motions intact bilaterally V,VII: smile symmetric, facial light touch sensation decreased on left side  VIII: hearing normal bilaterally IX,X: gag reflex present XI: bilateral shoulder shrug XII: midline tongue extension without atrophy or fasciculations  Motor: Right : Upper extremity   5/5    Left:     Upper extremity   5/5  Lower extremity   5/5     Lower extremity   5/5 Tone and bulk:normal tone throughout; no atrophy noted Sensory: Pinprick and light touch diminished on left lower extremity and left upper extremity as well as left sided face Deep Tendon Reflexes:  Right: Upper Extremity   Left: Upper extremity   biceps (C-5 to C-6) 2/4   biceps (C-5 to C-6) 2/4 tricep (C7) 2/4    triceps (C7) 2/4 Brachioradialis (C6) 2/4  Brachioradialis (C6) 2/4  Lower Extremity Lower Extremity  quadriceps (L-2 to L-4) 2/4   quadriceps (L-2 to L-4) 2/4 Achilles (S1) 2/4   Achilles (S1) 2/4  Plantars: Right: downgoing   Left: downgoing Cerebellar: normal finger-to-nose on right with slight abberration on the left,  normal heel-to-shin test  CV: pulses palpable throughout    Laboratory Studies:  Basic Metabolic Panel:  Recent Labs Lab 01/20/14 1300 01/20/14 1316  NA 136* 137  K 4.3 4.2  CL 99 101  CO2 25  --   GLUCOSE 204* 207*  BUN 18 18  CREATININE 0.77 0.80  CALCIUM 9.9  --     Liver Function Tests:  Recent Labs Lab 01/20/14 1300  AST 17  ALT 13  ALKPHOS 101  BILITOT 0.4  PROT 7.8  ALBUMIN 3.8   CBC:  Recent Labs Lab 01/20/14 1300 01/20/14 1316  WBC 9.7  --   NEUTROABS 4.8  --   HGB 13.8 15.0  HCT 39.7 44.0  MCV 86.3  --   PLT 266  --    Microbiology: Results for orders placed during the hospital encounter of 12/03/09  URINE CULTURE     Status: None   Collection Time    12/03/09  4:53 PM      Result Value  Ref Range Status   Specimen Description URINE, RANDOM   Final   Special Requests NONE   Final   Culture  Setup Time [id number]   Final   Colony Count NO GROWTH   Final   Culture NO GROWTH   Final   Report Status 12/05/2009 FINAL   Final  Coagulation Studies:  Recent Labs  01/20/14 1300  LABPROT 13.1  INR 0.98    Urinalysis:  Recent Labs Lab 01/20/14 1322  COLORURINE YELLOW  LABSPEC 1.021  PHURINE 5.0  GLUCOSEU NEGATIVE  HGBUR TRACE*  BILIRUBINUR NEGATIVE  KETONESUR NEGATIVE  PROTEINUR NEGATIVE  UROBILINOGEN 0.2  NITRITE NEGATIVE  LEUKOCYTESUR NEGATIVE    Lipid Panel:    Component Value Date/Time   CHOL 184 02/11/2012 0253   TRIG 287* 02/11/2012 0253   HDL 48 02/11/2012 0253   CHOLHDL 3.8 02/11/2012 0253   VLDL 57* 02/11/2012 0253   LDLCALC 79 02/11/2012 0253    HgbA1C:  Lab Results  Component Value Date   HGBA1C 8.9* 02/10/2012    Urine Drug Screen:     Component Value Date/Time   LABOPIA NONE DETECTED 01/20/2014 1322   COCAINSCRNUR NONE DETECTED 01/20/2014 1322   LABBENZ NONE DETECTED 01/20/2014 1322   AMPHETMU NONE DETECTED 01/20/2014 1322   THCU NONE DETECTED 01/20/2014 1322   LABBARB NONE DETECTED 01/20/2014 1322    Alcohol Level:  Recent Labs Lab 01/20/14 1300  ETH <11    Other results: EKG: unchanged from previous tracings, normal sinus rhythm, nonspecific ST and T waves changes.  Imaging: Ct Head Wo Contrast  01/20/2014   CLINICAL DATA:  Left-sided numbness and unsteady gait beginning today.  EXAM: CT HEAD WITHOUT CONTRAST  TECHNIQUE: Contiguous axial images were obtained from the base of the skull through the vertex without intravenous contrast.  COMPARISON:  Head CT scan 10/21/2009.  FINDINGS: There is no evidence of acute intracranial abnormality including infarct, hemorrhage, mass lesion, mass effect, midline shift or abnormal extra-axial fluid collection. No hydrocephalus or pneumocephalus. The calvarium is intact. Minimal mucosal  thickening left maxillary sinus is noted. No hydrocephalus or pneumocephalus. Carotid atherosclerosis noted.  IMPRESSION: No acute finding.  Stable compared to prior exam.   Electronically Signed   By: Inge Rise M.D.   On: 01/20/2014 15:12    Assessment: 73 y.o.  Woman pmh multiple MI 2/2 CAD s/p stenting, HTN, DM, HLD, tobacco abuse p/w new onset left sided numbness and tingling concerning for acute ischemic stroke.   Stroke Risk Factors - diabetes mellitus, hyperlipidemia, hypertension and smoking  Plan: 1. HgbA1c, fasting lipid panel 2. MRI, MRA  of the brain without contrast 3. PT consult, OT consult, Speech consult 4. Echocardiogram 5. Carotid dopplers 6. Prophylactic therapy- continue Antiplatelet med: Plavix - dose 75mg   7. Risk factor modification 8. Telemetry monitoring 9. Frequent neuro checks   Clinton Gallant, MD IM PGY-3 Pgr [phone]  01/20/2014, 4:48 PM     I have seen and evaluated the patient. I have reviewed the above note and agree with the above  36 yo F with sudden onset of left sided face, arm, leg numbness yesterday. She has multiple vascular risk factors.   She does smoke and has been counseled to quit.   On exam, she has intact cranial nerves with the exception of left facial numbness. She does have decreased pin to the left arm and leg as well. She also interestingly has some difficulty with FNF on the left.    I suspect that this is a small likely thalamic infarct.  I would pursue the stroke workup outlined above.   Roland Rack, MD [facility] [phone]  If 7pm- 7am, please page neurology on call as listed in [place].

## 2014-01-20 NOTE — ED Notes (Signed)
Pt. Reports having lt. Side numbness began last night at 300 am.  Lt. Facial numbness down to her lt. Foot.  Unsteady gait.  She denies any n/v/d/ denies any pain.  Speech is clear.  GCS 15.

## 2014-01-20 NOTE — H&P (Addendum)
[facility] History and Physical  Katherine Cole:[id number] DOB: [birth year] DOA: 01/20/2014  Referring physician: Dr. Leonides Schanz PCP: Tivis Ringer, MD   Chief Complaint: left side weknaess  HPI: Katherine Cole is a 73 y.o. female past medical history of hypertension, diabetes mellitus 2 last hemoglobin A1c of 8.9, coronary artery disease status post stenting to the LAD on 2013, prior history of MI in 99 status post 2 stents with subsequent restenting 4 years later, ongoing tobacco abuse that comes in for less sided numbness this started the day prior to admission at 2 PM. She denies any weakness headache blurry vision,  no slurred speech she relates she has staggering gait which is now resolved. No palpitation or shortness of breath.    in the ED: She was not given TPA, as it has been more than 3 hours since her symptoms started, CT head is below UDS was negative.   Review of Systems:  Constitutional:  No weight loss, night sweats, Fevers, chills, fatigue.  HEENT:  No headaches, Difficulty swallowing,Tooth/dental problems,Sore throat,  No sneezing, itching, ear ache, nasal congestion, post nasal drip,  Cardio-vascular:  No chest pain, Orthopnea, PND, swelling in lower extremities, anasarca, dizziness, palpitations  GI:  No heartburn, indigestion, abdominal pain, nausea, vomiting, diarrhea, change in bowel habits, loss of appetite  Resp:  No shortness of breath with exertion or at rest. No excess mucus, no productive cough, No non-productive cough, No coughing up of blood.No change in color of mucus.No wheezing.No chest wall deformity  Skin:  no rash or lesions.  GU:  no dysuria, change in color of urine, no urgency or frequency. No flank pain.  Musculoskeletal:  No joint pain or swelling. No decreased range of motion. No back pain.  Psych:  No change in mood or affect. No depression or anxiety. No memory loss.   Past Medical History  Diagnosis Date  . Colon polyps    adenomatous  . GERD (gastroesophageal reflux disease)   . Hyperlipemia   . Depression   . Hypertension   . Skin cancer 2012    "nose; right middle finger"  . CAD (coronary artery disease)     a. MI 1999 with 2 stents at that time followed by stenting 3-4 yrs later. b. s/p DES to LAD guided by pressure wire 03/11/12.  . Diabetes mellitus, type 2   . Osteoarthritis   . Arthritis     "in my fingers"    Past Surgical History  Procedure Laterality Date  . Coronary angioplasty with stent placement  1999; 2004; 03/11/2012    "2 + 1 + 1; total of 4" (03/11/2012)  . Appendectomy  ~ 1965  . Tonsillectomy and adenoidectomy  ~ 1962  . Femur fracture surgery  2011    RLE (03/11/2012)  . Tubal ligation  1977  . Skin cancer excision  2013    "nose & right middle finger; actinic keratosis" (03/11/2012)   Social History:  reports that she has been smoking Cigarettes.  She has a 51 pack-year smoking history. She has never used smokeless tobacco. She reports that she does not drink alcohol or use illicit drugs.  Allergies  Allergen Reactions  . Codeine Other (See Comments)    "gives me a migraine" (03/11/2012)  . Morphine And Related Other (See Comments)    "gives me a migraine" (03/11/2012)    Family History  Problem Relation Age of Onset  . Colon cancer Brother 37  . Lung cancer Sister   .  Lung cancer Brother   . Stomach cancer Brother   . Diabetes Daughter      Prior to Admission medications   Medication Sig Start Date End Date Taking? Authorizing Provider  acetaminophen (TYLENOL) 325 MG tablet Take 650 mg by mouth every 6 (six) hours as needed. For pain   Yes Historical Provider, MD  atorvastatin (LIPITOR) 80 MG tablet Take 80 mg by mouth daily.   Yes Historical Provider, MD  clopidogrel (PLAVIX) 75 MG tablet Take 75 mg by mouth daily.   Yes Historical Provider, MD  escitalopram (LEXAPRO) 10 MG tablet Take 10 mg by mouth daily.  03/02/12  Yes Historical Provider, MD  esomeprazole  (NEXIUM) 40 MG capsule Take 40 mg by mouth daily at 12 noon.   Yes Historical Provider, MD  ibuprofen (ADVIL,MOTRIN) 200 MG tablet Take 400 mg by mouth every 6 (six) hours as needed (for pain).   Yes Historical Provider, MD  insulin glargine (LANTUS) 100 UNIT/ML injection Inject 25 Units into the skin 2 (two) times daily.    Yes Historical Provider, MD  JANUVIA 100 MG tablet Take 100 mg by mouth daily. 11/03/13  Yes Historical Provider, MD  meloxicam (MOBIC) 15 MG tablet Take 1 tablet (15 mg total) by mouth daily. 03/12/12  Yes Dayna N Dunn, PA-C  metFORMIN (GLUCOPHAGE) 1000 MG tablet Take 1,000 mg by mouth 2 (two) times daily with a meal.   Yes Historical Provider, MD  metoprolol succinate (TOPROL-XL) 50 MG 24 hr tablet Take 50 mg by mouth daily. Take with or immediately following a meal.   Yes Historical Provider, MD  nitroGLYCERIN (NITROSTAT) 0.4 MG SL tablet Place 1 tablet (0.4 mg total) under the tongue every 5 (five) minutes as needed (up to 3 doses). For chest pain 03/12/12  Yes Charlie Pitter, PA-C   Physical Exam: Filed Vitals:   01/20/14 1530 01/20/14 1600 01/20/14 1610 01/20/14 1615  BP: 139/55 136/49 136/49 126/89  Pulse: 58 58 61 58  Resp: 17 21 17 16   SpO2: 99% 98% 98% 98%    Wt Readings from Last 3 Encounters:  08/20/13 49.896 kg (110 lb)  04/22/12 50.259 kg (110 lb 12.8 oz)  03/25/12 48.988 kg (108 lb)    General:  Appears calm and comfortable Eyes: PERRL, normal lids, irises & conjunctiva ENT: grossly normal hearing, lips & tongue Neck: no LAD, masses or thyromegaly Cardiovascular: RRR, no m/r/g. No LE edema. Telemetry: SR, no arrhythmias  Respiratory: CTA bilaterally, no w/r/r. Normal respiratory effort. Abdomen: soft, ntnd Skin: no rash or induration seen on limited exam Musculoskeletal: grossly normal tone BUE/BLE Psychiatric: grossly normal mood and affect, speech fluent and appropriate Neurologic: 3-12 are grossly intact, muscle strength is 5 over 5 in all 4  extremities deep tendon reflexes 2+ symmetrical bilateral. She has numbness on the left side of her face or arm and lower child he the right side of her body is intact.           Labs on Admission:  Basic Metabolic Panel:  Recent Labs Lab 01/20/14 1300 01/20/14 1316  NA 136* 137  K 4.3 4.2  CL 99 101  CO2 25  --   GLUCOSE 204* 207*  BUN 18 18  CREATININE 0.77 0.80  CALCIUM 9.9  --    Liver Function Tests:  Recent Labs Lab 01/20/14 1300  AST 17  ALT 13  ALKPHOS 101  BILITOT 0.4  PROT 7.8  ALBUMIN 3.8   No results found for this  basename: LIPASE, AMYLASE,  in the last 168 hours No results found for this basename: AMMONIA,  in the last 168 hours CBC:  Recent Labs Lab 01/20/14 1300 01/20/14 1316  WBC 9.7  --   NEUTROABS 4.8  --   HGB 13.8 15.0  HCT 39.7 44.0  MCV 86.3  --   PLT 266  --    Cardiac Enzymes: No results found for this basename: CKTOTAL, CKMB, CKMBINDEX, TROPONINI,  in the last 168 hours  BNP (last 3 results) No results found for this basename: PROBNP,  in the last 8760 hours CBG: No results found for this basename: GLUCAP,  in the last 168 hours  Radiological Exams on Admission: Ct Head Wo Contrast  01/20/2014   CLINICAL DATA:  Left-sided numbness and unsteady gait beginning today.  EXAM: CT HEAD WITHOUT CONTRAST  TECHNIQUE: Contiguous axial images were obtained from the base of the skull through the vertex without intravenous contrast.  COMPARISON:  Head CT scan 10/21/2009.  FINDINGS: There is no evidence of acute intracranial abnormality including infarct, hemorrhage, mass lesion, mass effect, midline shift or abnormal extra-axial fluid collection. No hydrocephalus or pneumocephalus. The calvarium is intact. Minimal mucosal thickening left maxillary sinus is noted. No hydrocephalus or pneumocephalus. Carotid atherosclerosis noted.  IMPRESSION: No acute finding.  Stable compared to prior exam.   Electronically Signed   By: Inge Rise M.D.    On: 01/20/2014 15:12    EKG: Independently reviewed. Sinus rhythm right axis deviation.  Assessment/Plan CVA (cerebral infarction): - Swallowing evaluation done okay to proceed with diet. - HgbA1c, fasting lipid panel  - MRI, MRA of the brain without contrast  - PT consult, OT consult, Speech consult  - Echocardiogram  - Carotid dopplers  - Prophylactic therapy-Antiplatelet med: cont plavix will need ASA. - She is on high-dose statins continue. Check a fasting lipid panel. - Avoid D5 fluids as may be harmfull - risk factor modification  - Cardiac Monitoring  - Neurochecks q4h  - Keep MAP 70 - Stop ibuprofen and meloxicam she is in a be on aspirin and Plavix. High risk of bleeding.    Diabetes mellitus with peripheral vascular disease: - Check hemoglobin A1c, DC metformin increased her Lantus - Start sliding scale insulin, continue tradjenta.  Tobacco abuse: - Counseling done, she would like to be started on Chantix.  Hypertension - Permissive hypertension continue metoprolol.  Code Status: full DVT Prophylaxis: heparin Family Communication: none Disposition Plan: inpatient  Time spent: 34 minutes  Charlynne Cousins [facility] Pager [phone]

## 2014-01-20 NOTE — ED Provider Notes (Signed)
TIME SEEN: 1:06 PM  CHIEF COMPLAINT: Left-sided numbness  HPI: Patient is a 73 year old female with history of hypertension, diabetes, hyperlipidemia, coronary artery disease status post stents, tobacco use who presents emergency department with left-sided numbness that started at 2 PM yesterday afternoon. She denies any weakness. No headache, head injury. No vision changes. No changes in her speech. She states she has had some "staggering" gait. She denies any chest pain or shortness of breath. No vomiting or diarrhea. No bloody stools or melena. No dysuria or hematuria. She denies a prior history of CVA or TIA. She is on Plavix.  PCP is Dr. Dagmar Hait with [facility]  ROS: See HPI Constitutional: no fever  Eyes: no drainage  ENT: no runny nose   Cardiovascular:  no chest pain  Resp: no SOB  GI: no vomiting GU: no dysuria Integumentary: no rash  Allergy: no hives  Musculoskeletal: no leg swelling  Neurological: no slurred speech ROS otherwise negative  PAST MEDICAL HISTORY/PAST SURGICAL HISTORY:  Past Medical History  Diagnosis Date  . Colon polyps     adenomatous  . GERD (gastroesophageal reflux disease)   . Hyperlipemia   . Depression   . Hypertension   . Skin cancer 2012    "nose; right middle finger"  . CAD (coronary artery disease)     a. MI 1999 with 2 stents at that time followed by stenting 3-4 yrs later. b. s/p DES to LAD guided by pressure wire 03/11/12.  . Diabetes mellitus, type 2   . Osteoarthritis   . Arthritis     "in my fingers"     MEDICATIONS:  Prior to Admission medications   Medication Sig Start Date End Date Taking? Authorizing Provider  acetaminophen (TYLENOL) 325 MG tablet Take 650 mg by mouth every 6 (six) hours as needed. For pain    Historical Provider, MD  aspirin 81 MG EC tablet Take 1 tablet (81 mg total) by mouth daily. 02/11/12   Jessica A Hope, PA-C  atorvastatin (LIPITOR) 80 MG tablet Take 80 mg by mouth daily.    Historical Provider, MD   clopidogrel (PLAVIX) 75 MG tablet Take 75 mg by mouth daily.    Historical Provider, MD  escitalopram (LEXAPRO) 10 MG tablet Take 10 mg by mouth daily.  03/02/12   Historical Provider, MD  ibuprofen (ADVIL,MOTRIN) 200 MG tablet Take 2 tablets (400 mg total) by mouth every 6 (six) hours as needed. For pain. Only use sparingly given your heart disease and also due to risk of stomach bleeding while you are on aspirin/Plavix. 03/12/12   Dayna N Dunn, PA-C  insulin glargine (LANTUS) 100 UNIT/ML injection Inject 18 Units into the skin at bedtime.     Historical Provider, MD  LORazepam (ATIVAN) 2 MG tablet Take 2 mg by mouth every 6 (six) hours as needed.  02/28/12   Historical Provider, MD  losartan (COZAAR) 100 MG tablet Take 100 mg by mouth daily.    Historical Provider, MD  meloxicam (MOBIC) 15 MG tablet Take 1 tablet (15 mg total) by mouth daily. 03/12/12   Dayna N Dunn, PA-C  metFORMIN (GLUCOPHAGE) 500 MG tablet Take 2 tablets (1,000 mg total) by mouth 2 (two) times daily with a meal. 03/12/12   Dayna N Dunn, PA-C  metoprolol succinate (TOPROL-XL) 50 MG 24 hr tablet Take 50 mg by mouth daily. Take with or immediately following a meal.    Historical Provider, MD  nitroGLYCERIN (NITROSTAT) 0.4 MG SL tablet Place 1 tablet (0.4  mg total) under the tongue every 5 (five) minutes as needed (up to 3 doses). For chest pain 03/12/12   Dayna N Dunn, PA-C  pantoprazole (PROTONIX) 40 MG tablet Take 1 tablet (40 mg total) by mouth daily. 03/12/12   Dayna N Dunn, PA-C    ALLERGIES:  Allergies  Allergen Reactions  . Codeine Other (See Comments)    "gives me a migraine" (03/11/2012)  . Morphine And Related Other (See Comments)    "gives me a migraine" (03/11/2012)    SOCIAL HISTORY:  History  Substance Use Topics  . Smoking status: Current Every Day Smoker -- 1.00 packs/day for 51 years    Types: Cigarettes  . Smokeless tobacco: Never Used  . Alcohol Use: No    FAMILY HISTORY: Family History  Problem  Relation Age of Onset  . Colon cancer Brother 53  . Lung cancer Sister   . Lung cancer Brother   . Stomach cancer Brother   . Diabetes Daughter     EXAM: There were no vitals taken for this visit. CONSTITUTIONAL: Alert and oriented and responds appropriately to questions. Well-appearing; well-nourished HEAD: Normocephalic EYES: Conjunctivae clear, PERRL ENT: normal nose; no rhinorrhea; moist mucous membranes; pharynx without lesions noted NECK: Supple, no meningismus, no LAD  CARD: RRR; S1 and S2 appreciated; no murmurs, no clicks, no rubs, no gallops RESP: Normal chest excursion without splinting or tachypnea; breath sounds clear and equal bilaterally; no wheezes, no rhonchi, no rales,  ABD/GI: Normal bowel sounds; non-distended; soft, non-tender, no rebound, no guarding BACK:  The back appears normal and is non-tender to palpation, there is no CVA tenderness EXT: Normal ROM in all joints; non-tender to palpation; no edema; normal capillary refill; no cyanosis    SKIN: Normal color for age and race; warm NEURO: Moves all extremities equally; sensation to light touch is decreased in her left face and left arm and left lower extremity, strength is 5/5 in all 4 extremities, no pronator drift, patient does have some dysmetria with finger to nose testing with the left upper extremity, normal gait PSYCH: The patient's mood and manner are appropriate. Grooming and personal hygiene are appropriate.  MEDICAL DECISION MAKING: Patient here with left-sided numbness that started at 2 PM yesterday. She is outside of the TPA window. Concern for possible stroke.  We'll obtain labs, urine, head CT. Will discuss with neurology.  ED PROGRESS: 4:28 PM  Patient's labs are unremarkable. Urine shows trace hemoglobin but no other sign of infection. CT head unremarkable for acute changes. Discussed with Dr. Leonel Ramsay with neurology who agrees that patient may have had a stroke. He agrees with admission to the  medicine service for stroke workup. Will discuss with hospitalist. Neurology will see the patient in consult.  4:36 PM  D/w Dr. Venetia Constable with hospitalist service for admission to inpatient, telemetry bed. Patient updated with plan.   EKG Interpretation  Date/Time:  Thursday January 20 2014 12:55:31 EDT Ventricular Rate:  63 PR Interval:  147 QRS Duration: 75 QT Interval:  423 QTC Calculation: 433 R Axis:   83 Text Interpretation:  Sinus rhythm Borderline right axis deviation Nonspecific T abnormalities, lateral leads No significant change since last tracing Confirmed by WARD,  DO, KRISTEN [phone]) on 01/20/2014 2:03:59 PM         [place], DO 01/20/14 1636

## 2014-01-20 NOTE — ED Notes (Signed)
Neuro at bedside.

## 2014-01-20 NOTE — ED Notes (Signed)
To ct

## 2014-01-20 NOTE — ED Notes (Signed)
Returned from ct 

## 2014-01-21 ENCOUNTER — Other Ambulatory Visit: Payer: Self-pay | Admitting: Neurology

## 2014-01-21 DIAGNOSIS — E785 Hyperlipidemia, unspecified: Secondary | ICD-10-CM

## 2014-01-21 DIAGNOSIS — I639 Cerebral infarction, unspecified: Secondary | ICD-10-CM

## 2014-01-21 DIAGNOSIS — I517 Cardiomegaly: Secondary | ICD-10-CM

## 2014-01-21 DIAGNOSIS — E1159 Type 2 diabetes mellitus with other circulatory complications: Secondary | ICD-10-CM

## 2014-01-21 LAB — GLUCOSE, CAPILLARY
GLUCOSE-CAPILLARY: 93 mg/dL (ref 70–99)
GLUCOSE-CAPILLARY: 121 mg/dL — AB (ref 70–99)
Glucose-Capillary: 154 mg/dL — ABNORMAL HIGH (ref 70–99)
Glucose-Capillary: 198 mg/dL — ABNORMAL HIGH (ref 70–99)

## 2014-01-21 LAB — LIPID PANEL
CHOL/HDL RATIO: 4.3 ratio
CHOLESTEROL: 158 mg/dL (ref 0–200)
HDL: 37 mg/dL — ABNORMAL LOW (ref >39)
LDL CALC: 90 mg/dL (ref 0–99)
TRIGLYCERIDES: 154 mg/dL — AB (ref <150)
VLDL: 31 mg/dL (ref 0–40)

## 2014-01-21 LAB — HEMOGLOBIN A1C
Hgb A1c MFr Bld: 10.6 % — ABNORMAL HIGH (ref <5.7)
Mean Plasma Glucose: 258 mg/dL — ABNORMAL HIGH (ref <117)

## 2014-01-21 MED ORDER — NICOTINE 21 MG/24HR TD PT24: 21.0000 mg | MEDICATED_PATCH | Freq: Every day | TRANSDERMAL | Status: DC

## 2014-01-21 MED ORDER — NICOTINE 21 MG/24HR TD PT24
21.0000 mg | MEDICATED_PATCH | Freq: Every day | TRANSDERMAL | Status: DC
  Administered 2014-01-21 – 2014-01-22 (×2): 21 mg via TRANSDERMAL
  Filled 2014-01-21 (×2): qty 1

## 2014-01-21 MED ORDER — INSULIN GLARGINE 100 UNIT/ML ~~LOC~~ SOLN: 30.0000 [IU] | Freq: Two times a day (BID) | SUBCUTANEOUS | Status: DC

## 2014-01-21 MED ORDER — INSULIN GLARGINE 100 UNIT/ML ~~LOC~~ SOLN
28.0000 [IU] | Freq: Two times a day (BID) | SUBCUTANEOUS | Status: DC
  Administered 2014-01-21 – 2014-01-22 (×2): 28 [IU] via SUBCUTANEOUS
  Filled 2014-01-21 (×4): qty 0.28

## 2014-01-21 MED ORDER — ASPIRIN 81 MG PO TABS: 81.0000 mg | ORAL_TABLET | Freq: Every day | ORAL | Status: AC

## 2014-01-21 NOTE — Evaluation (Signed)
Physical Therapy Evaluation Patient Details Name: Katherine Cole MRN: [id number] DOB: 1940-09-20 Today's Date: 01/21/2014   History of Present Illness  73 y.o.  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    Clinical Impression  Patient demonstrates deficits in functional mobility as indicated below. Will need continued skilled PT to address deficits and maximize function. Will see as indicated and progress as tolerated. Recommend HHPT and use or RW upon initial discharge with family supervision.    Follow Up Recommendations Home health PT;Supervision for mobility/OOB    Equipment Recommendations  Rolling walker with 5" wheels    Recommendations for Other Services       Precautions / Restrictions Precautions Precautions: Fall Precaution Comments: Old Hip fx with nailing Right side, paresthesias LLE Restrictions Weight Bearing Restrictions: No      Mobility  Bed Mobility Overal bed mobility: Modified Independent             General bed mobility comments: increased time to perform  Transfers Overall transfer level: Needs assistance Equipment used: 1 person hand held assist Transfers: Sit to/from Stand Sit to Stand: Min guard            Ambulation/Gait Ambulation/Gait assistance: Min guard;Min assist Ambulation Distance (Feet): 260 Feet Assistive device: 1 person hand held assist Gait Pattern/deviations: Step-through pattern;Decreased stride length;Trunk flexed;Narrow base of support Gait velocity: decreased Gait velocity interpretation: Below normal speed for age/gender General Gait Details: patient with instability noted, multiple balance checks requiring assist to correct.   Stairs            Wheelchair Mobility    Modified Rankin (Stroke Patients  Only) Modified Rankin (Stroke Patients Only) Pre-Morbid Rankin Score: No symptoms Modified Rankin: Moderately severe disability     Balance                                 Standardized Balance Assessment Standardized Balance Assessment : Dynamic Gait Index   Dynamic Gait Index Level Surface: Mild Impairment Change in Gait Speed: Moderate Impairment Gait with Horizontal Head Turns: Moderate Impairment Gait with Vertical Head Turns: Moderate Impairment Gait and Pivot Turn: Moderate Impairment Step Over Obstacle: Moderate Impairment Step Around Obstacles: Mild Impairment       Pertinent Vitals/Pain Pain Assessment: No/denies pain    Home Living Family/patient expects to be discharged to:: Private residence Living Arrangements: Other (Comment) (will go to daughters house) Available Help at Discharge: Family Type of Home: House Home Access: Stairs to enter   Technical brewer of Steps: 6 Home Layout: One level Home Equipment: None      Prior Function Level of Independence: Independent               Hand Dominance   Dominant Hand: Right    Extremity/Trunk Assessment   Upper Extremity Assessment: Defer to OT evaluation           Lower Extremity Assessment: LLE deficits/detail      Cervical / Trunk Assessment: Kyphotic  Communication   Communication: No difficulties  Cognition Arousal/Alertness: Awake/alert Behavior During Therapy: Impulsive Overall Cognitive Status: Within Functional Limits for tasks assessed Area of Impairment: Safety/judgement;Awareness         Safety/Judgement: Decreased awareness of safety;Decreased awareness of deficits Awareness: Emergent  General Comments: Patient with some instability noted but feels that it is appropriate for her to get up and move without assist even after being told to have close supervision for OOB mobility    General Comments General comments (skin integrity, edema, etc.):  extensive education ZD:GUYQIH BEFAST (booklet provided) Educated on safety with mobility and need for assist initially.    Exercises        Assessment/Plan    PT Assessment Patient needs continued PT services  PT Diagnosis Difficulty walking;Abnormality of gait   PT Problem List Decreased strength;Decreased range of motion;Decreased activity tolerance;Decreased balance;Decreased mobility;Decreased safety awareness;Impaired sensation  PT Treatment Interventions DME instruction;Gait training;Stair training;Functional mobility training;Therapeutic activities;Therapeutic exercise;Balance training;Patient/family education   PT Goals (Current goals can be found in the Care Plan section) Acute Rehab PT Goals Patient Stated Goal: to be indpendent PT Goal Formulation: With patient/family Time For Goal Achievement: 02/04/14 Potential to Achieve Goals: Good    Frequency Min 4X/week   Barriers to discharge        Co-evaluation               End of Session Equipment Utilized During Treatment: Gait belt Activity Tolerance: Patient tolerated treatment well Patient left: in bed;with call bell/phone within reach;with family/visitor present Nurse Communication: Mobility status         Time: 1310-1337 PT Time Calculation (min): 27 min   Charges:   PT Evaluation $Initial PT Evaluation Tier I: 1 Procedure PT Treatments $Gait Training: 8-22 mins $Self Care/Home Management: 8-22   PT G CodesDuncan Dull 01/21/2014, 1:55 PM Alben Deeds, [place] DPT  [phone]

## 2014-01-21 NOTE — Progress Notes (Signed)
*  PRELIMINARY RESULTS* Echocardiogram 2D Echocardiogram has been performed.  Leavy Cella 01/21/2014, 11:46 AM

## 2014-01-21 NOTE — Progress Notes (Signed)
SLP Cancellation Note  Patient Details Name: Katherine Cole MRN: [id number] DOB: 1940/12/05   Cancelled treatment:       Reason Eval/Treat Not Completed: Patient at procedure or test/unavailable. Working with OT.   [place], [employer] [place] [phone]    [name] 01/21/2014, 2:02 PM

## 2014-01-21 NOTE — Progress Notes (Signed)
UR Completed Jesica Goheen Graves-Bigelow, RN,BSN [phone]  

## 2014-01-21 NOTE — Progress Notes (Signed)
VASCULAR LAB PRELIMINARY  PRELIMINARY  PRELIMINARY  PRELIMINARY  Carotid duplex  completed.    Preliminary report:  Bilateral:  1-39% ICA stenosis.  Vertebral artery flow is antegrade.      Tatijana Bierly, RVT 01/21/2014, 6:49 PM

## 2014-01-21 NOTE — Progress Notes (Signed)
STROKE TEAM PROGRESS NOTE   HISTORY Katherine Cole is a 73 y.o. female pmh most significant for multiple MI s/p stenting most recent in 12/14, HTN, DM, and HLD who p/w sustained numbness and tingling in her left upper extremity and lower extremity since 1400 on 01/19/14. The pt states that suddenly at 1400 she noticed diffuse numbness and tingling of her left hand and leg but denied any associated weakness, gait abnormality, or aphagia. She lives with her son and grandson who didn't comment on any noticable facial droop or dysarthria. She also denied any CP, SOB, HA, or blurry vision. The patient stated she took a couple of baby aspirin thinking this would help with no avail, which prompted her to present to ED. She denied ever having an event like this in the past and has no known family members with CVA but significant for cardiac disease. She continues to smoke 0.5 ppd and has about a 35 pack year history.    Date last known well: 01/19/14  Time last known well: 1400  tPA Given: No: outside tpa window   SUBJECTIVE (INTERVAL HISTORY) Daughter present. Pt still having numbness and tingling left side and left jaw. Current smoker and not compliant with medication. DM not in control, recently increased insulin dose.  OBJECTIVE Temp:  [98.2 F (36.8 C)-98.9 F (37.2 C)] 98.7 F (37.1 C) (10/16 1635) Pulse Rate:  [55-72] 72 (10/16 1635) Cardiac Rhythm:  [-] Normal sinus rhythm (10/16 0823) Resp:  [14-18] 18 (10/16 1635) BP: (108-147)/(51-68) 127/65 mmHg (10/16 1635) SpO2:  [97 %-100 %] 100 % (10/16 1635) Weight:  [108 lb 14.5 oz (49.399 kg)-109 lb (49.442 kg)] 108 lb 14.5 oz (49.399 kg) (10/16 0018)   Recent Labs Lab 01/20/14 2006 01/21/14 0902 01/21/14 1203 01/21/14 1647  GLUCAP 176* 154* 93 121*    Recent Labs Lab 01/20/14 1300 01/20/14 1316  NA 136* 137  K 4.3 4.2  CL 99 101  CO2 25  --   GLUCOSE 204* 207*  BUN 18 18  CREATININE 0.77 0.80  CALCIUM 9.9  --     Recent  Labs Lab 01/20/14 1300  AST 17  ALT 13  ALKPHOS 101  BILITOT 0.4  PROT 7.8  ALBUMIN 3.8    Recent Labs Lab 01/20/14 1300 01/20/14 1316  WBC 9.7  --   NEUTROABS 4.8  --   HGB 13.8 15.0  HCT 39.7 44.0  MCV 86.3  --   PLT 266  --    No results found for this basename: CKTOTAL, CKMB, CKMBINDEX, TROPONINI,  in the last 168 hours  Recent Labs  01/20/14 1300  LABPROT 13.1  INR 0.98    Recent Labs  01/20/14 1322  COLORURINE YELLOW  LABSPEC 1.021  PHURINE 5.0  GLUCOSEU NEGATIVE  HGBUR TRACE*  BILIRUBINUR NEGATIVE  KETONESUR NEGATIVE  PROTEINUR NEGATIVE  UROBILINOGEN 0.2  NITRITE NEGATIVE  LEUKOCYTESUR NEGATIVE       Component Value Date/Time   CHOL 158 01/21/2014 0310   TRIG 154* 01/21/2014 0310   HDL 37* 01/21/2014 0310   CHOLHDL 4.3 01/21/2014 0310   VLDL 31 01/21/2014 0310   LDLCALC 90 01/21/2014 0310   Lab Results  Component Value Date   HGBA1C 10.6* 01/21/2014      Component Value Date/Time   LABOPIA NONE DETECTED 01/20/2014 1322   COCAINSCRNUR NONE DETECTED 01/20/2014 1322   LABBENZ NONE DETECTED 01/20/2014 1322   AMPHETMU NONE DETECTED 01/20/2014 [place] DETECTED 01/20/2014 1322  LABBARB NONE DETECTED 01/20/2014 1322     Recent Labs Lab 01/20/14 1300  ETH <11    Dg Chest 2 View 01/20/2014    1. No active cardiopulmonary disease. 2. COPD.    Ct Head Wo Contrast 01/20/2014    No acute finding.  Stable compared to prior exam.    Mr Brain Wo Contrast 01/20/2014    1. 6 mm acute ischemic infarct involving the lateral right thalamus. No associated mass effect or intracranial hemorrhage.  2. Age-appropriate atrophy with mild chronic small vessel ischemic changes.  3. 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    MRA HEAD IMPRESSION:   Limited study due to motion. No proximal branch occlusion identified. No definite hemodynamically significant stenosis, although evaluation is somewhat limited.     CUS - Bilateral: 1-39% ICA  stenosis. Vertebral artery flow is antegrade  2D echo - Left ventricle: Mild hypokinesis base-inferior segment. The cavity size was normal. Wall thickness was increased in a pattern of mild LVH. The estimated ejection fraction was 60%. - Right ventricle: The cavity size was normal. Systolic function was normal. - Impressions: No cardiac source of embolism was identified, but cannot be ruled out on the basis of this examination. Impressions: - No cardiac source of embolism was identified, but cannot be ruled out on the basis of this examination.  PHYSICAL EXAM Physical exam  Temp:  [98.2 F (36.8 C)-98.9 F (37.2 C)] 98.7 F (37.1 C) (10/16 1635) Pulse Rate:  [55-72] 72 (10/16 1635) Resp:  [14-18] 18 (10/16 1635) BP: (108-147)/(51-68) 127/65 mmHg (10/16 1635) SpO2:  [97 %-100 %] 100 % (10/16 1635) Weight:  [108 lb 14.5 oz (49.399 kg)-109 lb (49.442 kg)] 108 lb 14.5 oz (49.399 kg) (10/16 0018)  General - Well nourished, well developed, in no apparent distress.  Ophthalmologic - not able to see through.  Cardiovascular - Regular rate and rhythm with no murmur.  Mental Status -  Level of arousal and orientation to time, place, and person were intact. Language including expression, naming, repetition, comprehension was assessed and found intact.  Cranial Nerves II - XII - II - Visual field intact OU. III, IV, VI - Extraocular movements intact. V - left V2 and V3 decreased light touch sensation about 80% of right. VII - Facial movement intact bilaterally. VIII - Hearing & vestibular intact bilaterally. X - Palate elevates symmetrically. XI - Chin turning & shoulder shrug intact bilaterally. XII - Tongue protrusion intact.  Motor Strength - The patient's strength was normal in all extremities and pronator drift was absent.  Bulk was normal and fasciculations were absent.   Motor Tone - Muscle tone was assessed at the neck and appendages and was normal.  Reflexes - The  patient's reflexes were normal in all extremities and she had no pathological reflexes.  Sensory - Light touch, temperature/pinprick decreased on the left, about 50% of the right.    Coordination - The patient had normal movements in the hands and feet with no ataxia or dysmetria.  Tremor was absent.  Gait and Station - deferred.   ASSESSMENT/PLAN Ms. AAMIRA BISCHOFF is a 73 y.o. female with history of CAD (Mi and stents), Htn, Hld, DM, poor compliance with medications,and ongoing tobacco use, presenting with sustained numbness and tingling in her left upper extremity and lower extremity. She did not receive IV t-PA due to late presentation.   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  infarct secondary to small  vessel disease.    MRI  As above  MRA  As above  Carotid Doppler  Unremarkable   echo  unremarkable  LDL 90, not meeting the goal < 70, Lipitor 80 mg daily PTA   HgbA1c 10.6  [place] Heparin for VTE prophylaxis  Carb Control with thin liquids  Activity as tolerated  Plavix 75 mg daily prior to admission, now on aspirin 81 mg orally every day and clopidogrel 75 mg orally every day.   Patient counseled to be compliant with her antithrombotic medications. As per daughter pt was not compliant with medication.   Risk factor education - Counseled to quit smoking.  Ongoing aggressive risk factor management  Resultant Left hemisensory deficit  Therapy recommendations:  Pending  Disposition:  Pending  Hypertension  Home meds:   Toprol XL 50 mg daily  Permissive hypertension (OK if < 220/120) but gradually normalize in 5-7 days  Hyperlipidemia  Home meds:  Lipitor 80 mg daily resumed in hospital  LDL 90 not at goal  Pt seems not compliant with medication  Continue statin at discharge  Diabetes  HgbA1c 10.6 goal < 7.0  Uncontrolled  Not compliant with medication  Recent insulin dose change  DM education  recommended  Tobacco abuse - current smoker - smoking cessation counseling provided.  Other Stroke Risk Factors Advanced age   Coronary artery disease, s/p stent 03/2013  Other Active Problems  Medical compliance  Hospital day # [facility] Pager [phone] 01/21/2014, 7:01 PM  I, the attending vascular neurologist, have personally obtained a history, examined the patient, evaluated laboratory data, individually viewed imaging studies, and formulated the assessment and plan of care.  I have made any additions or clarifications directly to the above note and agree with the findings and plan as currently documented.   Neurology will sign off. Please call with questions. Pt will follow up with Dr. Erlinda Hong at [facility] in about 2 months. Thanks for the consult.  Rosalin Hawking, MD PhD Stroke Neurology 01/21/2014 7:10 PM    To contact Stroke Continuity provider, please refer to [email or web address] After hours, contact General Neurology

## 2014-01-21 NOTE — Care Management Note (Signed)
    Page 1 of 1   01/23/2014     4:58:54 PM CARE MANAGEMENT NOTE 01/23/2014  Patient:  Katherine Cole, Katherine Cole   Account Number:  [id number]  Date Initiated:  01/21/2014  Documentation initiated by:  GRAVES-BIGELOW,BRENDA  Subjective/Objective Assessment:   Pt admitted for left side weekness. MRI positive for  6 mm acute ischemic infarct involving the lateral right thalamus.     Action/Plan:   CM to monitor for disposition needs. PT/OT to consult.   Anticipated DC Date:  01/22/2014   Anticipated DC Plan:  [place]  CM consult      Choice offered to / List presented to:             Status of service:  Completed, signed off Medicare Important Message given?  YES (If response is "NO", the following Medicare IM given date fields will be blank) Date Medicare IM given:  01/21/2014 Medicare IM given by:  GRAVES-BIGELOW,BRENDA Date Additional Medicare IM given:   Additional Medicare IM given by:    Discharge Disposition:  HOME/SELF CARE  Per UR Regulation:  Reviewed for med. necessity/level of care/duration of stay  If discussed at [place] of Stay Meetings, dates discussed:    Comments:  01/22/14 10:10 Cm met on unit by RN and MD both stating pt refuses any hoime health services. No other Cm needs were communicated.  Mariane Masters, BSn, [place].

## 2014-01-21 NOTE — Evaluation (Signed)
Occupational Therapy Evaluation Patient Details Name: DENIKA KRONE MRN: [id number] DOB: 1940-10-04 Today's Date: 01/21/2014    History of Present Illness 73 y.o.  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     Clinical Impression   Ms. Wadas presents with higher level balance difficulty and decreased efficiency with balance strategies.  Feel she is overall supervision to modified independent level for basic selfcare tasks.  Pt plans to discharge home with her daughter.  Have recommended initial use of a shower seat, which family will provide.  Feel pt will not need any further OT at this time as the only deficits other than mild balance issues that were noted were sensory loss in the LUE when compared to the RUE.  Have educated pt and family on compensation for this when testing hot and cold.  Feel PT can continue addressing balance impairment.      Follow Up Recommendations  No OT follow up    Equipment Recommendations  None recommended by OT       Precautions / Restrictions Precautions Precautions: Fall Precaution Comments: Old Hip fx with nailing Right side, paresthesias LLE Restrictions Weight Bearing Restrictions: No      Mobility Bed Mobility Overal bed mobility: Modified Independent             General bed mobility comments: increased time to perform  Transfers Overall transfer level: Needs assistance Equipment used: 1 person hand held assist Transfers: Sit to/from Stand Sit to Stand: Supervision         General transfer comment: Pt with occasional swaying with mobility to and from the bathroom but no physical assist needed to correct.     Balance Overall balance assessment: Needs assistance   Sitting balance-Leahy Scale: Normal       Standing balance-Leahy Scale:  Good Standing balance comment: Pt with increased swaying with mobility without assistive device but did not need assist to self correct.  Pt with decreased ability to stand in tandem or on one leg.  Demonstrated slightly delayed stepping strategy when regaining balance.                  Standardized Balance Assessment Standardized Balance Assessment : Dynamic Gait Index   Dynamic Gait Index Level Surface: Mild Impairment Change in Gait Speed: Moderate Impairment Gait with Horizontal Head Turns: Moderate Impairment Gait with Vertical Head Turns: Moderate Impairment Gait and Pivot Turn: Moderate Impairment Step Over Obstacle: Moderate Impairment Step Around Obstacles: Mild Impairment      ADL Overall ADL's : Needs assistance/impaired Eating/Feeding: Independent   Grooming: Supervision/safety;Standing   Upper Body Bathing: Modified independent;Sitting   Lower Body Bathing: Supervison/ safety;Sit to/from stand   Upper Body Dressing : Modified independent   Lower Body Dressing: Supervision/safety   Toilet Transfer: Supervision/safety;Regular Museum/gallery exhibitions officer and Hygiene: Supervision/safety;Sit to/from stand   Tub/ Shower Transfer: Supervision/safety;Ambulation   Functional mobility during ADLs: Supervision/safety General ADL Comments: Pt overall supervision to modified independent with selfcare tasks sit to stand and for functional transfers.  Slight dynamic balance issues noted so feel she will benefit from supervision at discharge and use of a shower stool for safety.  Pt's daughter plans to provide both.     Vision Eye Alignment: Within Functional Limits Alignment/Gaze Preference: Within Defined Limits Ocular Range of Motion:  Within Functional Limits Tracking/Visual Pursuits: Decreased smoothness of horizontal tracking;Decreased smoothness of vertical tracking (slight jerkiness noted with general tracking in all directions) Saccades: Within  functional limits           Perception Perception Perception Tested?: Yes   Praxis Praxis Praxis tested?: Within functional limits    Pertinent Vitals/Pain Pain Assessment: No/denies pain     Hand Dominance Right   Extremity/Trunk Assessment Upper Extremity Assessment Upper Extremity Assessment: LUE deficits/detail LUE Sensation: decreased light touch (Pt reports slight numbness in the left hand and digits compared to the right.)   Lower Extremity Assessment Lower Extremity Assessment: Defer to PT evaluation LLE Sensation: decreased light touch;decreased proprioception LLE Coordination: decreased fine motor   Cervical / Trunk Assessment Cervical / Trunk Assessment: Kyphotic   Communication Communication Communication: No difficulties   Cognition Arousal/Alertness: Awake/alert Behavior During Therapy: WFL for tasks assessed/performed Overall Cognitive Status: Within Functional Limits for tasks assessed Area of Impairment: Safety/judgement         Safety/Judgement: Decreased awareness of safety Awareness: Emergent   General Comments: Patient with some instability noted but feels that it is appropriate for her to get up and move without assist even after being told to have close supervision for OOB mobility   General Comments               Home Living Family/patient expects to be discharged to:: Private residence Living Arrangements: Other (Comment) (will go to daughters house) Available Help at Discharge: Family (son and 2 grandsons) Type of Home: House Home Access: Stairs to enter Technical brewer of Steps: 6   Home Layout: One level     Bathroom Shower/Tub: Occupational psychologist: Standard     Home Equipment: None          Prior Functioning/Environment Level of Independence: Independent                      OT Goals(Current goals can be found in the care plan section) Acute Rehab OT Goals Patient Stated Goal: to be  indpendent  OT Frequency:                End of Session Equipment Utilized During Treatment: Gait belt Nurse Communication: Mobility status  Activity Tolerance: Patient tolerated treatment well Patient left: in bed;with family/visitor present   Time: 1400-1423 OT Time Calculation (min): 23 min Charges:  OT General Charges $OT Visit: 1 Procedure OT Evaluation $Initial OT Evaluation Tier I: 1 Procedure OT Treatments $Self Care/Home Management : 8-22 mins  Nina Mondor OTR/L 01/21/2014, 2:40 PM

## 2014-01-21 NOTE — Discharge Summary (Signed)
Discharge Summary  Katherine Cole [id number] DOB: Jun 14, 1940  PCP: Tivis Ringer, MD  Admit date: 01/20/2014 Anticipated Discharge date: 01/22/2014  Time spent: 35 minutes  Recommendations for Outpatient Follow-up:  1. New medication: Aspirin 81 mg by mouth daily. Patient previously was taking this, but very inconsistently. 2. New medication: Nicotine patch 21 mg topically daily 3. Patient will go home with home health PT and a rolling walker 4. Medication change: Patient's Lantus being increased to 30 units twice a day (previously 25 units twice a day)   Discharge Diagnoses:  Active Hospital Problems   Diagnosis Date Noted  . Acute CVA (cerebrovascular accident) 01/20/2014  . Tobacco abuse 02/10/2012  . Hypertension 02/10/2012  . CAD (coronary artery disease) 02/10/2012  . 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 Hyperlipidemia 08/01/2007    Resolved Hospital Problems   Diagnosis Date Noted Date Resolved  No resolved problems to display.    Discharge Condition: Improved, discharged home  Diet recommendation: Heart healthy, carb modified  Filed Weights   01/20/14 2000 01/21/14 0018  Weight: 49.442 kg (109 lb) 49.399 kg (108 lb 14.5 oz)    History of present illness:  Patient is a 73 year old female past medical history of tobacco abuse, CAD, poorly controlled diabetes and hypertension who presented to the emergency room on 10/15 with a one-day history of left-sided numbness but no associated weakness, headache, blurred vision or speech. She did have some difficulty walking, although that is since improved. Initial CT scan was negative and patient was admitted for further workup.  Hospital Course:  Principal Problem:   Acute CVA (cerebrovascular accident): Symptoms persisted. MRI confirmed a 6 mm acute ischemic infarct involving the lateral right thalamus. Lipids checked and patient found to have an elevated LDL of 90, although she  is already on the maximum Lipitor dose of 80 mg. She is already on Plavix and inconsistently takes an aspirin. Daily aspirin added to her regimen. Carotid Dopplers and 2-D echo unrevealing. This is felt to likely be secondary to small vessel disease. A1c checked and found to be poorly controlled 10.6. Patient will have her Lantus increased from 25 units to 30 units twice a day. Patient also continues to smoke and she has been strongly encouraged abuse needs to stop immediately. Seen by OT who cleared her. Speech therapy cleared her. PT recommend home health PT with rolling walker which we are setting up.  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 Nicotine patch prescription given   Hypertension: Stable.   Procedures:  2-D echo done 10/16: Preserved ejection fraction, no diastolic dysfunction  Carotid Dopplers: Preliminary. On 10/16-no evidence of significant intracranial stenoses  Consultations:  Neurology  Discharge Exam: BP 127/65  Pulse 72  Temp(Src) 98.7 F (37.1 C) (Oral)  Resp 18  Ht 5\' 1"  (1.549 m)  Wt 49.399 kg (108 lb 14.5 oz)  BMI 20.59 kg/m2  SpO2 100%  General: Alert and oriented x3, no acute distress Cardiovascular: Regular rate and rhythm, S1-S2 Respiratory: Clear to auscultation bilaterally  Discharge Instructions You were cared for by a hospitalist during your hospital stay. If you have any questions about your discharge medications or the care you received while you were in the hospital after you are  discharged, you can call the unit and asked to speak with the hospitalist on call if the hospitalist that took care of you is not available. Once you are discharged, your primary care physician will handle any further medical  issues. Please note that NO REFILLS for any discharge medications will be authorized once you are discharged, as it is imperative that you return to your primary care physician (or establish a relationship with a primary care physician if you do not have one) for your aftercare needs so that they can reassess your need for medications and monitor your lab values.     Medication List         acetaminophen 325 MG tablet  Commonly known as:  TYLENOL  Take 650 mg by mouth every 6 (six) hours as needed. For pain     aspirin 81 MG tablet  Take 1 tablet (81 mg total) by mouth daily.     atorvastatin 80 MG tablet  Commonly known as:  LIPITOR  Take 80 mg by mouth daily.     clopidogrel 75 MG tablet  Commonly known as:  PLAVIX  Take 75 mg by mouth daily.     escitalopram 10 MG tablet  Commonly known as:  LEXAPRO  Take 10 mg by mouth daily.     esomeprazole 40 MG capsule  Commonly known as:  NEXIUM  Take 40 mg by mouth daily at 12 noon.     ibuprofen 200 MG tablet  Commonly known as:  ADVIL,MOTRIN  Take 400 mg by mouth every 6 (six) hours as needed (for pain).     insulin glargine 100 UNIT/ML injection  Commonly known as:  LANTUS  Inject 0.3 mLs (30 Units total) into the skin 2 (two) times daily.     JANUVIA 100 MG tablet  Generic drug:  sitaGLIPtin  Take 100 mg by mouth daily.     meloxicam 15 MG tablet  Commonly known as:  MOBIC  Take 1 tablet (15 mg total) by mouth daily.     metFORMIN 1000 MG tablet  Commonly known as:  GLUCOPHAGE  Take 1,000 mg by mouth 2 (two) times daily with a meal.     metoprolol succinate 50 MG 24 hr tablet  Commonly known as:  TOPROL-XL  Take 50 mg by mouth daily. Take with or immediately following a meal.     nicotine 21 mg/24hr patch  Commonly known as:  NICODERM CQ - dosed in mg/24 hours  Place 1 patch (21 mg total) onto the skin daily.     nitroGLYCERIN 0.4 MG SL tablet  Commonly known as:  NITROSTAT  Place 1 tablet (0.4 mg total) under  the tongue every 5 (five) minutes as needed (up to 3 doses). For chest pain       Allergies  Allergen Reactions  . Codeine Other (See Comments)    "gives me a migraine" (03/11/2012)  . Morphine And Related Other (See Comments)    "gives me a migraine" (03/11/2012)      The results of significant diagnostics from this hospitalization (including imaging, microbiology, ancillary and laboratory) are listed below for reference.    Significant Diagnostic Studies: Dg Chest 2 View  01/20/2014   CLINICAL DATA:  Stroke with left-sided weakness.  Initial encounter.  EXAM: CHEST  2 VIEW  COMPARISON:  02/10/2012  FINDINGS: No cardiomegaly. Negative aortic contours and hila. Coronary stents are noted. Pulmonary hyperinflation with probable apical emphysema. There is no edema, consolidation, effusion, or pneumothorax.  IMPRESSION: 1. No active cardiopulmonary disease. 2. COPD.   Electronically Signed   By: Jorje Guild M.D.   On: 01/20/2014 23:24   Ct Head Wo Contrast  01/20/2014   CLINICAL DATA:  Left-sided numbness and unsteady gait beginning today.  EXAM: CT HEAD WITHOUT CONTRAST  TECHNIQUE: Contiguous axial images were obtained from the base of the skull through the vertex without intravenous contrast.  COMPARISON:  Head CT scan 10/21/2009.  FINDINGS: There is no evidence of acute intracranial abnormality including infarct, hemorrhage, mass lesion, mass effect, midline shift or abnormal extra-axial fluid collection. No hydrocephalus or pneumocephalus. The calvarium is intact. Minimal mucosal thickening left maxillary sinus is noted. No hydrocephalus or pneumocephalus. Carotid atherosclerosis noted.  IMPRESSION: No acute finding.  Stable compared to prior exam.   Electronically Signed   By: Inge Rise M.D.   On: 01/20/2014 15:12   Mr Brain Wo Contrast  01/20/2014   CLINICAL DATA:  Initial evaluation for left-sided numbness. Concern for stroke.  EXAM: MRI HEAD WITHOUT CONTRAST  MRA HEAD WITHOUT  CONTRAST  TECHNIQUE: Multiplanar, multiecho pulse sequences of the brain and surrounding structures were obtained without intravenous contrast. Angiographic images of the head were obtained using MRA technique without contrast.  COMPARISON:  Prior CT from earlier the same day.  FINDINGS: MRI HEAD FINDINGS  Mild diffuse prominence of the CSF containing spaces compatible with generalized atrophy, appropriate for patient age. Patchy T2/FLAIR hyperintensity within the periventricular and deep white matter noted, most likely related to mild chronic small vessel ischemic changes. No focal parenchymal signal abnormality is identified. No mass lesion, midline shift, or extra-axial fluid collection. Ventricles are normal in size without evidence of hydrocephalus.  There is a in 6 mm focus of restricted diffusion involving the lateral aspect of the right thalamus, compatible with acute ischemic infarct. No significant mass effect. No associated hemorrhage. No other infarct identified.  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 Pituitary gland is within normal limits. Pituitary stalk is midline. The globes and optic nerves demonstrate a normal appearance with normal signal intensity. The  The bone marrow signal intensity is normal. Calvarium is intact. Visualized upper cervical spine is within normal limits.  Scalp soft tissues are unremarkable.  Paranasal sinuses are clear.  No mastoid effusion.  MRA HEAD FINDINGS  Study is degraded by motion artifact.  ANTERIOR CIRCULATION:  The distal cervical segments of the internal carotid arteries are widely patent with antegrade flow. Petrous segments and cavernous segments grossly patent. Supra clinoid segments not well evaluated due to motion. Similarly, A1 segments, anterior communicating artery, and anterior cerebral arteries are grossly patent but somewhat poorly evaluated due to motion. M1 segments are patent bilaterally without definite  stenosis or proximal branch occlusion.  POSTERIOR CIRCULATION:  Vertebral arteries are widely patent bilaterally as they course into the cranial vault. Vertebrobasilar junction and basilar artery are widely patent without hemodynamically significant stenosis or occlusion. Posterior cerebral arteries and superior cerebral arteries are grossly patent without definite stenosis, although evaluation is limited due to motion.  IMPRESSION: MRI HEAD IMPRESSION:  1. 6 mm acute ischemic infarct involving the lateral right thalamus. No associated mass effect or intracranial hemorrhage. 2. Age-appropriate atrophy with mild chronic small vessel ischemic changes. 3. 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  MRA HEAD IMPRESSION:  Limited study due to motion. No proximal branch occlusion identified. No definite hemodynamically significant stenosis, although evaluation is somewhat limited.   Electronically Signed   By:  Jeannine Boga M.D.   On: 01/20/2014 22:39   Mr Jodene Nam Head/brain Wo Cm  01/20/2014   CLINICAL DATA:  Initial evaluation for left-sided numbness. Concern for stroke.  EXAM: MRI HEAD WITHOUT CONTRAST  MRA HEAD WITHOUT CONTRAST  TECHNIQUE: Multiplanar, multiecho pulse sequences of the brain and surrounding structures were obtained without intravenous contrast. Angiographic images of the head were obtained using MRA technique without contrast.  COMPARISON:  Prior CT from earlier the same day.  FINDINGS: MRI HEAD FINDINGS  Mild diffuse prominence of the CSF containing spaces compatible with generalized atrophy, appropriate for patient age. Patchy T2/FLAIR hyperintensity within the periventricular and deep white matter noted, most likely related to mild chronic small vessel ischemic changes. No focal parenchymal signal abnormality is identified. No mass lesion, midline shift, or extra-axial fluid collection. Ventricles are normal in size without evidence of hydrocephalus.  There is a in 6 mm focus of restricted  diffusion involving the lateral aspect of the right thalamus, compatible with acute ischemic infarct. No significant mass effect. No associated hemorrhage. No other infarct identified.  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 Pituitary gland is within normal limits. Pituitary stalk is midline. The globes and optic nerves demonstrate a normal appearance with normal signal intensity. The  The bone marrow signal intensity is normal. Calvarium is intact. Visualized upper cervical spine is within normal limits.  Scalp soft tissues are unremarkable.  Paranasal sinuses are clear.  No mastoid effusion.  MRA HEAD FINDINGS  Study is degraded by motion artifact.  ANTERIOR CIRCULATION:  The distal cervical segments of the internal carotid arteries are widely patent with antegrade flow. Petrous segments and cavernous segments grossly patent. Supra clinoid segments not well evaluated due to motion. Similarly, A1 segments, anterior communicating artery, and anterior cerebral arteries are grossly patent but somewhat poorly evaluated due to motion. M1 segments are patent bilaterally without definite stenosis or proximal branch occlusion.  POSTERIOR CIRCULATION:  Vertebral arteries are widely patent bilaterally as they course into the cranial vault. Vertebrobasilar junction and basilar artery are widely patent without hemodynamically significant stenosis or occlusion. Posterior cerebral arteries and superior cerebral arteries are grossly patent without definite stenosis, although evaluation is limited due to motion.  IMPRESSION: MRI HEAD IMPRESSION:  1. 6 mm acute ischemic infarct involving the lateral right thalamus. No associated mass effect or intracranial hemorrhage. 2. Age-appropriate atrophy with mild chronic small vessel ischemic changes. 3. 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  MRA HEAD IMPRESSION:  Limited study due to motion. No proximal branch occlusion identified. No definite  hemodynamically significant stenosis, although evaluation is somewhat limited.   Electronically Signed   By: Jeannine Boga M.D.   On: 01/20/2014 22:39    Microbiology: No results found for this or any previous visit (from the past 240 hour(s)).   Labs: Basic Metabolic Panel:  Recent Labs Lab 01/20/14 1300 01/20/14 1316  NA 136* 137  K 4.3 4.2  CL 99 101  CO2 25  --   GLUCOSE 204* 207*  BUN 18 18  CREATININE 0.77 0.80  CALCIUM 9.9  --    Liver Function Tests:  Recent Labs Lab 01/20/14 1300  AST 17  ALT 13  ALKPHOS 101  BILITOT 0.4  PROT 7.8  ALBUMIN 3.8   No results found for this basename: LIPASE, AMYLASE,  in the last 168 hours No results found for this basename: AMMONIA,  in the last 168 hours CBC:  Recent Labs Lab  01/20/14 1300 01/20/14 1316  WBC 9.7  --   NEUTROABS 4.8  --   HGB 13.8 15.0  HCT 39.7 44.0  MCV 86.3  --   PLT 266  --    Cardiac Enzymes: No results found for this basename: CKTOTAL, CKMB, CKMBINDEX, TROPONINI,  in the last 168 hours BNP: BNP (last 3 results) No results found for this basename: PROBNP,  in the last 8760 hours CBG:  Recent Labs Lab 01/20/14 2006 01/21/14 0902 01/21/14 1203 01/21/14 1647  GLUCAP 176* 154* 93 121*       Signed:  Myrla Malanowski K  [facility] 01/21/2014, 7:17 PM

## 2014-01-21 NOTE — Progress Notes (Addendum)
Inpatient Diabetes Program Recommendations  AACE/ADA: New Consensus Statement on Inpatient Glycemic Control (2013)  Target Ranges:  Prepandial:   less than 140 mg/dL      Peak postprandial:   less than 180 mg/dL (1-2 hours)      Critically ill patients:  140 - 180 mg/dL   Reason for Assessment:   Results for Katherine Cole, Katherine Cole (MRN [id number]) as of 01/21/2014 14:20  Ref. Range 01/21/2014 04:00  Hemoglobin A1C Latest Range: <5.7 % 10.6 (H)   Diabetes history: Type 2 diabetes Outpatient Diabetes medications: Lantus 25 units bid, Metformin 1000 mg bid, Januvia 100 mg daily Hospital meds:  Lantus 35 units bid, Novolog 3 units tid with meals, Moderate tid with meals and HS  CBG's improved.  Will need follow-up with PCP, Dr. Dagmar Hait regarding A1C.  May consider reducing Lantus back to 25 units bid, and consider adding meal time Novolog to cover CHO intake at discharge.  Will follow.  Thanks, Adah Perl, RN, BC-ADM Inpatient Diabetes Coordinator Pager [phone]

## 2014-01-22 LAB — GLUCOSE, CAPILLARY
Glucose-Capillary: 224 mg/dL — ABNORMAL HIGH (ref 70–99)
Glucose-Capillary: 85 mg/dL (ref 70–99)

## 2014-01-22 MED ORDER — STROKE: EARLY STAGES OF RECOVERY BOOK
Freq: Once | Status: AC
Start: 2014-01-22 — End: 2014-01-22
  Administered 2014-01-22: 10:00:00
  Filled 2014-01-22: qty 1

## 2014-01-22 NOTE — Discharge Instructions (Signed)
Ischemic Stroke Blood carries oxygen to all areas of your body. A stroke happens when your blood does not flow to your brain like normal. If this happens, your brain will not get the oxygen it needs and brain tissue will die. This is an emergency. Problems (symptoms) of a stroke usually happen suddenly. You may notice them when you wake up. They can include:  Loss of feeling or weakness on one side of the body (face, arm, leg).  Feeling confused.  Trouble talking or understanding.  Trouble seeing.  Trouble walking.  Feeling dizzy.  Loss of balance or coordination.  Severe headache without a cause.  Trouble reading or writing. Get help as soon as any of these problems first start. This is important.  RISK FACTORS  Risk factors are things that make it more likely for you to have a stroke. These things include:  High blood pressure (hypertension).  High cholesterol.  Diabetes.  Heart disease.  Having a buildup of fatty deposits in the blood vessels.  Having an abnormal heart rhythm (atrial fibrillation).  Being very overweight (obese).  Smoking.  Taking birth control pills, especially if you smoke.  **Not being active.  **Having a diet high in fats, salt, and calories.  Drinking too much alcohol.  Using illegal drugs.  Being African American.  Being over the age of 55.  Having a family history of stroke.  Having a history of blood clots, stroke, warning stroke (transient ischemic attack, TIA), or heart attack.  Sickle cell disease. HOME CARE  Take all medicines exactly as told by your doctor. Understand all your medicine instructions.  You may need to take a medicine to thin your blood, like aspirin or warfarin. Take warfarin exactly as told.  Taking too much or too little warfarin is dangerous. Get regular blood tests as told, including the PT and INR tests. The test results help your doctor adjust your dose of warfarin. Your PT and INR levels must be  done as often as told by your doctor.  Food can cause problems with warfarin and affect the results of your blood tests. This is true for foods high in vitamin K, such as spinach, kale, broccoli, cabbage, collard and turnip greens, Brussels sprouts, peas, cauliflower, seaweed, and parsley, as well as beef and pork liver, green tea, and soybean oil. Eat the same amount of food high in vitamin K. Avoid major changes in your diet. Tell your doctor before changing your diet. Talk to a food specialist (dietitian) if you have questions.  Many medicines can cause problems with warfarin and affect your PT and INR test results. Tell your doctor about all medicines you take. This includes vitamins and dietary pills (supplements). Be careful with aspirin and medicines that relieve redness, soreness, and puffiness (inflammation). Do not take or stop medicines unless your doctor tells you to.  Warfarin can cause a lot of bruising or bleeding. Hold pressure over cuts for longer than normal. Talk to your doctor about other side effects of warfarin.  Avoid sports or activities that may cause injury or bleeding.  Be careful when you shave, floss your teeth, or use sharp objects.  Avoid alcoholic drinks or drink very little alcohol while taking warfarin. Tell your doctor if you change how much alcohol you drink.  Tell your dentist and other doctors that you take warfarin before procedures.  If you are able to swallow, eat healthy foods. Eat 5 or more servings of fruits and vegetables a day. Eat soft   foods, pureed foods, or eat small bites of food so you do not choke.  Follow your diet program as told, if you are given one.  Keep a healthy weight.  Stay active. Try to get at least 30 minutes of activity on most or all days.  Do not smoke.  Limit how much alcohol you drink even if you are not taking warfarin. Moderate alcohol use is:  No more than 2 drinks each day for men.  No more than 1 drink each day  for women who are not pregnant.  Keep your home safe so you do not fall. Try:  Putting grab bars in the bedroom and bathroom.  Raising toilet seats.  Putting a seat in the shower.  Go to therapy sessions (physical, occupational, and speech) as told by your doctor.  Use a walker or cane at all times if told to do so.  Keep all doctor visits as told. GET HELP IF:  Your personality changes.  You have trouble swallowing.  You are seeing two of everything.  You are dizzy.  You have a fever.  Your skin starts to break down. GET HELP RIGHT AWAY IF:  The symptoms below may be a sign of an emergency. Do not wait to see if the symptoms go away. Call for help (911 in [place] Do not drive yourself to the hospital.  You have sudden weakness or numbness on the face, arm, or leg (especially on one side of the body).  You have sudden trouble walking or moving your arms or legs.  You have sudden confusion.  You have trouble talking or understanding.  You have sudden trouble seeing in one or both eyes.  You lose your balance or your movements are not smooth.  You have a sudden, severe headache with no known cause.  You have new chest pain or you feel your heart beating in an unsteady way.  You are partly or totally unaware of what is going on around you. Document Released: 03/14/2011 Document Revised: 08/09/2013 Document Reviewed: 11/03/2011 [employer] Patient Information 2015 [employer]. This information is not intended to replace advice given to you by your health care provider. Make sure you discuss any questions you have with your health care provider.  

## 2014-01-22 NOTE — Progress Notes (Signed)
Physical Therapy Treatment Patient Details Name: Katherine Cole MRN: [id number] DOB: December 11, 1940 Today's Date: 01/22/2014    History of Present Illness 73 y.o.  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      PT Comments    Patient progressing well with mobility. Reports improved sensation in LUE/LE from yesterday. Balance improved as pt tolerated higher level balance challenges without difficulty or LOB. Safely able to ambulate without AD. Due to improvement in mobility, discharge recommendation and equipment recommendation updated, see below. Recommend supervision at home initially for safety. Encouraged slowly increasing activity level at home. Will continue to follow and progress as tolerated emphasizing higher level balance activities so pt can return to PLOF.   Follow Up Recommendations  No PT follow up;Supervision for mobility/OOB     Equipment Recommendations  None recommended by PT    Recommendations for Other Services       Precautions / Restrictions Precautions Precaution Comments: Old Hip fx with nailing Right side, paresthesias LLE Restrictions Weight Bearing Restrictions: No    Mobility  Bed Mobility Overal bed mobility: Modified Independent             General bed mobility comments: HOB elevated.  Transfers Overall transfer level: Needs assistance Equipment used: None Transfers: Sit to/from Stand Sit to Stand: Independent         General transfer comment: Stood from EOB x1. No balance deficits noted. Stood from toilet without grab bar, no issues.  Ambulation/Gait Ambulation/Gait assistance: Supervision Ambulation Distance (Feet): 300 Feet Assistive device: None Gait Pattern/deviations: Step-through pattern;Decreased stride length     General Gait Details:  Balance improved from yesterday. Tolerated higher level balance challenges without LOB. See balance section for details.   Stairs Stairs: Yes Stairs assistance: Supervision Stair Management: Alternating pattern Number of Stairs: 11 General stair comments: Supervision for safety and cues to slow down.  Wheelchair Mobility    Modified Rankin (Stroke Patients Only) Modified Rankin (Stroke Patients Only) Pre-Morbid Rankin Score: No symptoms Modified Rankin: Moderate disability     Balance Overall balance assessment: Needs assistance   Sitting balance-Leahy Scale: Normal     Standing balance support: During functional activity Standing balance-Leahy Scale: Good Standing balance comment: Able to weightshift and reach outside BoS in static standing and perform dynamic standing activities without LOB. No UE necessary for safety.             High level balance activites: Side stepping;Backward walking;Direction changes;Turns;Sudden stops;Head turns High Level Balance Comments: Tolerated above high level balance activities with mild deficits noted.     Cognition Arousal/Alertness: Awake/alert Behavior During Therapy: WFL for tasks assessed/performed Overall Cognitive Status: Within Functional Limits for tasks assessed                      Exercises      General Comments General comments (skin integrity, edema, etc.): Family requesting another stroke booklet for her brothers as pt will be at 2 different houses. RN made aware and ordering from pharmacy.      Pertinent Vitals/Pain Pain Assessment: No/denies pain    Home Living                      Prior Function            PT  Goals (current goals can now be found in the care plan section) Progress towards PT goals: Progressing toward goals    Frequency       PT Plan Discharge plan needs to be updated;Equipment recommendations need to be updated    Co-evaluation             End of Session  Equipment Utilized During Treatment: Gait belt Activity Tolerance: Patient tolerated treatment well Patient left: Other (comment);with family/visitor present (walking around in room with daughter present.)     Time: 0919-0934 PT Time Calculation (min): 15 min  Charges:  $Gait Training: 8-22 mins                    G CodesCandy Sledge A Feb 16, 2014, 10:11 AM Candy Sledge, PT, DPT [phone]

## 2014-01-22 NOTE — Discharge Summary (Signed)
Discharge Summary  Katherine Cole MGQ:[id number] DOB: 04/07/1941  PCP: Tivis Ringer, MD  Admit date: 01/20/2014 Anticipated Discharge date: 01/22/2014  Time spent: 25 minutes  Recommendations for Outpatient Follow-up:  1. New medication: Aspirin 81 mg by mouth daily. Patient previously was taking this, but very inconsistently. 2. New medication: Nicotine patch 21 mg topically daily 3. Medication change: Patient's Lantus being increased to 30 units twice a day (previously 25 units twice a day)   Discharge Diagnoses:  Active Hospital Problems   Diagnosis Date Noted  . Acute CVA (cerebrovascular accident) 01/20/2014  . Tobacco abuse 02/10/2012  . Hypertension 02/10/2012  . CAD (coronary artery disease) 02/10/2012  . 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 Hyperlipidemia 08/01/2007    Resolved Hospital Problems   Diagnosis Date Noted Date Resolved  No resolved problems to display.    Discharge Condition: Improved, discharged home  Diet recommendation: Heart healthy, carb modified  Filed Weights   01/20/14 2000 01/21/14 0018 01/22/14 0400  Weight: 49.442 kg (109 lb) 49.399 kg (108 lb 14.5 oz) 49.204 kg (108 lb 7.6 oz)    History of present illness:  Patient is a 73 year old female past medical history of tobacco abuse, CAD, poorly controlled diabetes and hypertension who presented to the emergency room on 10/15 with a one-day history of left-sided numbness but no associated weakness, headache, blurred vision or speech. She did have some difficulty walking, although that is since improved. Initial CT scan was negative and patient was admitted for further workup.  Hospital Course:  Principal Problem:   Acute CVA (cerebrovascular accident): Symptoms persisted. MRI confirmed a 6 mm acute ischemic infarct involving the lateral right thalamus. Lipids checked and patient found to have an elevated LDL of 90, although she is already on the maximum  Lipitor dose of 80 mg. She is already on Plavix and inconsistently takes an aspirin. Daily aspirin added to her regimen. Carotid Dopplers and 2-D echo unrevealing. This is felt to likely be secondary to small vessel disease. A1c checked and found to be poorly controlled 10.6. Patient will have her Lantus increased from 25 units to 30 units twice a day. Patient also continues to smoke and she has been strongly encouraged abuse needs to stop immediately. Seen by OT who cleared her. Speech therapy cleared her. 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 Nicotine patch prescription given   Hypertension: Stable.   Procedures:  2-D echo done 10/16: Preserved ejection fraction, no diastolic dysfunction  Carotid Dopplers: Preliminary. On 10/16-no evidence of significant intracranial stenoses  Consultations:  Neurology  Discharge Exam: BP 137/54  Pulse 66  Temp(Src) 97.8 F (36.6 C) (Oral)  Resp 20  Ht 5\' 1"  (1.549 m)  Wt 49.204 kg (108 lb 7.6 oz)  BMI 20.51 kg/m2  SpO2 99%  General: Alert and oriented x3, no acute distress Cardiovascular: Regular rate and rhythm, S1-S2 Respiratory: Clear to auscultation bilaterally  Discharge Instructions You were cared for by a hospitalist during your hospital stay. If you have any questions about your discharge medications or the care you received while you were in  the hospital after you are discharged, you can call the unit and asked to speak with the hospitalist on call if the hospitalist that took care of you is not available. Once you are discharged, your primary care  physician will handle any further medical issues. Please note that NO REFILLS for any discharge medications will be authorized once you are discharged, as it is imperative that you return to your primary care physician (or establish a relationship with a primary care physician if you do not have one) for your aftercare needs so that they can reassess your need for medications and monitor your lab values.      Discharge Instructions   Diet - low sodium heart healthy    Complete by:  As directed      Increase activity slowly    Complete by:  As directed             Medication List         acetaminophen 325 MG tablet  Commonly known as:  TYLENOL  Take 650 mg by mouth every 6 (six) hours as needed. For pain     aspirin 81 MG tablet  Take 1 tablet (81 mg total) by mouth daily.     atorvastatin 80 MG tablet  Commonly known as:  LIPITOR  Take 80 mg by mouth daily.     clopidogrel 75 MG tablet  Commonly known as:  PLAVIX  Take 75 mg by mouth daily.     escitalopram 10 MG tablet  Commonly known as:  LEXAPRO  Take 10 mg by mouth daily.     esomeprazole 40 MG capsule  Commonly known as:  NEXIUM  Take 40 mg by mouth daily at 12 noon.     ibuprofen 200 MG tablet  Commonly known as:  ADVIL,MOTRIN  Take 400 mg by mouth every 6 (six) hours as needed (for pain).     insulin glargine 100 UNIT/ML injection  Commonly known as:  LANTUS  Inject 0.3 mLs (30 Units total) into the skin 2 (two) times daily.     JANUVIA 100 MG tablet  Generic drug:  sitaGLIPtin  Take 100 mg by mouth daily.     meloxicam 15 MG tablet  Commonly known as:  MOBIC  Take 1 tablet (15 mg total) by mouth daily.     metFORMIN 1000 MG tablet  Commonly known as:  GLUCOPHAGE  Take 1,000 mg by mouth 2 (two) times daily with a meal.     metoprolol succinate 50 MG 24 hr tablet  Commonly known as:  TOPROL-XL  Take 50 mg by mouth daily. Take with or immediately following a meal.     nicotine 21 mg/24hr patch    Commonly known as:  NICODERM CQ - dosed in mg/24 hours  Place 1 patch (21 mg total) onto the skin daily.     nitroGLYCERIN 0.4 MG SL tablet  Commonly known as:  NITROSTAT  Place 1 tablet (0.4 mg total) under the tongue every 5 (five) minutes as needed (up to 3 doses). For chest pain       Allergies  Allergen Reactions  . Codeine Other (See Comments)    "gives me a migraine" (03/11/2012)  . Morphine And Related Other (See Comments)    "gives me a migraine" (03/11/2012)   Follow-up Information   Follow up with Tivis Ringer, MD In 1 week.   Specialty:  Internal Medicine   Contact information:   [address] [place] [address]  [phone]        The results of significant diagnostics from this hospitalization (including imaging, microbiology, ancillary and laboratory) are listed below for reference.    Significant Diagnostic Studies: Dg Chest 2 View  01/20/2014   CLINICAL DATA:  Stroke with left-sided weakness.  Initial encounter.  EXAM: CHEST  2 VIEW  COMPARISON:  02/10/2012  FINDINGS: No cardiomegaly. Negative aortic contours and hila. Coronary stents are noted. Pulmonary hyperinflation with probable apical emphysema. There is no edema, consolidation, effusion, or pneumothorax.  IMPRESSION: 1. No active cardiopulmonary disease. 2. COPD.   Electronically Signed   By: Jorje Guild M.D.   On: 01/20/2014 23:24   Ct Head Wo Contrast  01/20/2014   CLINICAL DATA:  Left-sided numbness and unsteady gait beginning today.  EXAM: CT HEAD WITHOUT CONTRAST  TECHNIQUE: Contiguous axial images were obtained from the base of the skull through the vertex without intravenous contrast.  COMPARISON:  Head CT scan 10/21/2009.  FINDINGS: There is no evidence of acute intracranial abnormality including infarct, hemorrhage, mass lesion, mass effect, midline shift or abnormal extra-axial fluid collection. No hydrocephalus or pneumocephalus. The calvarium is intact. Minimal mucosal thickening  left maxillary sinus is noted. No hydrocephalus or pneumocephalus. Carotid atherosclerosis noted.  IMPRESSION: No acute finding.  Stable compared to prior exam.   Electronically Signed   By: Inge Rise M.D.   On: 01/20/2014 15:12   Mr Brain Wo Contrast  01/20/2014   CLINICAL DATA:  Initial evaluation for left-sided numbness. Concern for stroke.  EXAM: MRI HEAD WITHOUT CONTRAST  MRA HEAD WITHOUT CONTRAST  TECHNIQUE: Multiplanar, multiecho pulse sequences of the brain and surrounding structures were obtained without intravenous contrast. Angiographic images of the head were obtained using MRA technique without contrast.  COMPARISON:  Prior CT from earlier the same day.  FINDINGS: MRI HEAD FINDINGS  Mild diffuse prominence of the CSF containing spaces compatible with generalized atrophy, appropriate for patient age. Patchy T2/FLAIR hyperintensity within the periventricular and deep white matter noted, most likely related to mild chronic small vessel ischemic changes. No focal parenchymal signal abnormality is identified. No mass lesion, midline shift, or extra-axial fluid collection. Ventricles are normal in size without evidence of hydrocephalus.  There is a in 6 mm focus of restricted diffusion involving the lateral aspect of the right thalamus, compatible with acute ischemic infarct. No significant mass effect. No associated hemorrhage. No other infarct identified.  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 Pituitary gland is within normal limits. Pituitary stalk is midline. The globes and optic nerves demonstrate a normal appearance with normal signal intensity. The  The bone marrow signal intensity is normal. Calvarium is intact. Visualized upper cervical spine is within normal limits.  Scalp soft tissues are unremarkable.  Paranasal sinuses are clear.  No mastoid effusion.  MRA HEAD FINDINGS  Study is degraded by motion artifact.  ANTERIOR CIRCULATION:  The distal  cervical segments of the internal carotid arteries are widely patent with antegrade flow. Petrous segments and cavernous segments grossly patent. Supra clinoid segments not well evaluated due to motion. Similarly, A1 segments, anterior communicating artery, and anterior cerebral arteries are grossly patent but somewhat poorly evaluated due to motion. M1 segments are patent bilaterally without definite stenosis or proximal branch occlusion.  POSTERIOR CIRCULATION:  Vertebral arteries are widely patent bilaterally as they course into the cranial vault. Vertebrobasilar junction and basilar artery are widely patent without hemodynamically significant stenosis or occlusion. Posterior cerebral arteries and superior  cerebral arteries are grossly patent without definite stenosis, although evaluation is limited due to motion.  IMPRESSION: MRI HEAD IMPRESSION:  1. 6 mm acute ischemic infarct involving the lateral right thalamus. No associated mass effect or intracranial hemorrhage. 2. Age-appropriate atrophy with mild chronic small vessel ischemic changes. 3. 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  MRA HEAD IMPRESSION:  Limited study due to motion. No proximal branch occlusion identified. No definite hemodynamically significant stenosis, although evaluation is somewhat limited.   Electronically Signed   By: Jeannine Boga M.D.   On: 01/20/2014 22:39   Mr Jodene Nam Head/brain Wo Cm  01/20/2014   CLINICAL DATA:  Initial evaluation for left-sided numbness. Concern for stroke.  EXAM: MRI HEAD WITHOUT CONTRAST  MRA HEAD WITHOUT CONTRAST  TECHNIQUE: Multiplanar, multiecho pulse sequences of the brain and surrounding structures were obtained without intravenous contrast. Angiographic images of the head were obtained using MRA technique without contrast.  COMPARISON:  Prior CT from earlier the same day.  FINDINGS: MRI HEAD FINDINGS  Mild diffuse prominence of the CSF containing spaces compatible with generalized atrophy,  appropriate for patient age. Patchy T2/FLAIR hyperintensity within the periventricular and deep white matter noted, most likely related to mild chronic small vessel ischemic changes. No focal parenchymal signal abnormality is identified. No mass lesion, midline shift, or extra-axial fluid collection. Ventricles are normal in size without evidence of hydrocephalus.  There is a in 6 mm focus of restricted diffusion involving the lateral aspect of the right thalamus, compatible with acute ischemic infarct. No significant mass effect. No associated hemorrhage. No other infarct identified.  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 Pituitary gland is within normal limits. Pituitary stalk is midline. The globes and optic nerves demonstrate a normal appearance with normal signal intensity. The  The bone marrow signal intensity is normal. Calvarium is intact. Visualized upper cervical spine is within normal limits.  Scalp soft tissues are unremarkable.  Paranasal sinuses are clear.  No mastoid effusion.  MRA HEAD FINDINGS  Study is degraded by motion artifact.  ANTERIOR CIRCULATION:  The distal cervical segments of the internal carotid arteries are widely patent with antegrade flow. Petrous segments and cavernous segments grossly patent. Supra clinoid segments not well evaluated due to motion. Similarly, A1 segments, anterior communicating artery, and anterior cerebral arteries are grossly patent but somewhat poorly evaluated due to motion. M1 segments are patent bilaterally without definite stenosis or proximal branch occlusion.  POSTERIOR CIRCULATION:  Vertebral arteries are widely patent bilaterally as they course into the cranial vault. Vertebrobasilar junction and basilar artery are widely patent without hemodynamically significant stenosis or occlusion. Posterior cerebral arteries and superior cerebral arteries are grossly patent without definite stenosis, although evaluation is  limited due to motion.  IMPRESSION: MRI HEAD IMPRESSION:  1. 6 mm acute ischemic infarct involving the lateral right thalamus. No associated mass effect or intracranial hemorrhage. 2. Age-appropriate atrophy with mild chronic small vessel ischemic changes. 3. 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  MRA HEAD IMPRESSION:  Limited study due to motion. No proximal branch occlusion identified. No definite hemodynamically significant stenosis, although evaluation is somewhat limited.   Electronically Signed   By: Jeannine Boga M.D.   On: 01/20/2014 22:39    Microbiology: No results found for this or any previous visit (from the past 240 hour(s)).   Labs: Basic Metabolic Panel:  Recent Labs Lab 01/20/14 1300 01/20/14 1316  NA 136* 137  K 4.3 4.2  CL  99 101  CO2 25  --   GLUCOSE 204* 207*  BUN 18 18  CREATININE 0.77 0.80  CALCIUM 9.9  --    Liver Function Tests:  Recent Labs Lab 01/20/14 1300  AST 17  ALT 13  ALKPHOS 101  BILITOT 0.4  PROT 7.8  ALBUMIN 3.8   No results found for this basename: LIPASE, AMYLASE,  in the last 168 hours No results found for this basename: AMMONIA,  in the last 168 hours CBC:  Recent Labs Lab 01/20/14 1300 01/20/14 1316  WBC 9.7  --   NEUTROABS 4.8  --   HGB 13.8 15.0  HCT 39.7 44.0  MCV 86.3  --   PLT 266  --    Cardiac Enzymes: No results found for this basename: CKTOTAL, CKMB, CKMBINDEX, TROPONINI,  in the last 168 hours BNP: BNP (last 3 results) No results found for this basename: PROBNP,  in the last 8760 hours CBG:  Recent Labs Lab 01/21/14 1203 01/21/14 1647 01/21/14 2104 01/21/14 2226 01/22/14 0802  GLUCAP 93 121* 224* 198* 85       Signed:  KRISHNAN,SENDIL K  [facility] 01/22/2014, 9:55 AM

## 2014-01-22 NOTE — Progress Notes (Signed)
Re-evaluated by PT this morning; no PT follow up nor equipment recommended. Dr. Maryland Pink attending physician in room spoke with physical therapist. Reports no need for Home Health services.  Pt discharged home with family  in stable condition, to self care.  Care Management. Updated.

## 2014-02-21 ENCOUNTER — Ambulatory Visit: Payer: Medicare Other | Admitting: Neurology

## 2014-03-17 ENCOUNTER — Encounter (HOSPITAL_COMMUNITY): Payer: Self-pay | Admitting: Cardiovascular Disease

## 2014-09-10 ENCOUNTER — Emergency Department (INDEPENDENT_AMBULATORY_CARE_PROVIDER_SITE_OTHER)
Admission: EM | Admit: 2014-09-10 | Discharge: 2014-09-10 | Disposition: A | Discharge status: Home / Self Care | Payer: Medicare Other | Source: Home / Self Care | Attending: Emergency Medicine | Admitting: Emergency Medicine

## 2014-09-10 ENCOUNTER — Emergency Department (INDEPENDENT_AMBULATORY_CARE_PROVIDER_SITE_OTHER): Payer: Medicare Other

## 2014-09-10 ENCOUNTER — Encounter (HOSPITAL_COMMUNITY): Payer: Self-pay | Admitting: *Deleted

## 2014-09-10 DIAGNOSIS — S2231XA Fracture of one rib, right side, initial encounter for closed fracture: Secondary | ICD-10-CM | POA: Diagnosis not present

## 2014-09-10 MED ORDER — ONDANSETRON HCL 4 MG PO TABS: 4.0000 mg | ORAL_TABLET | Freq: Three times a day (TID) | ORAL | Status: DC | PRN

## 2014-09-10 MED ORDER — HYDROCODONE-ACETAMINOPHEN 5-325 MG PO TABS: 1.0000 | ORAL_TABLET | Freq: Four times a day (QID) | ORAL | Status: DC | PRN

## 2014-09-10 NOTE — ED Notes (Signed)
PT   STATES   ABOUT  4  DAYS  AGO   SHE  WAS  HUGGED  BY  HER  GRANDON  PERHAPS TOO  HARD   AND   DEVELOPED  PAIN  R  SIDE   R  RIB  CAGE     pT  STATES  IT  HURTS   WHEN  SHE MOVES  COUGHS  OR  TAKES A  DEEP  BREATH

## 2014-09-10 NOTE — Discharge Instructions (Signed)
You have a broken rib. This will heal over the next 4-6 weeks. Use the Norco every 4-6 hours as needed for pain. Do not drive while taking this medicine. Use the Zofran as needed for nausea. Please follow-up with your primary care doctor in 1-2 weeks for a recheck.

## 2014-09-10 NOTE — ED Provider Notes (Signed)
CSN: [id number]     Arrival date & time 09/10/14  1255 History   First MD Initiated Contact with Patient 09/10/14 1320     Chief Complaint  Patient presents with  . Rib Injury   (Consider location/radiation/quality/duration/timing/severity/associated sxs/prior Treatment) HPI  Katherine Cole is a 74 year old woman here for evaluation of right rib pain. Katherine Cole states on Monday Katherine Cole received a strong hug from her grandson. The next day Katherine Cole had pain in her right ribs. Katherine Cole reports pain with deep breathing and movement. Katherine Cole denies any other injury or trauma. The pain has stayed about the same since then. Katherine Cole denies any cough or fever. No urinary symptoms. Katherine Cole states Katherine Cole has had a DEXA scan and does not have osteoporosis.  Past Medical History  Diagnosis Date  . Colon polyps     adenomatous  . GERD (gastroesophageal reflux disease)   . Hyperlipemia   . Depression   . Hypertension   . Skin cancer 2012    "nose; right middle finger"  . CAD (coronary artery disease)     a. MI 1999 with 2 stents at that time followed by stenting 3-4 yrs later. b. s/p DES to LAD guided by pressure wire 03/11/12.  . Diabetes mellitus, type 2   . Osteoarthritis   . Arthritis     "in my fingers"    Past Surgical History  Procedure Laterality Date  . Coronary angioplasty with stent placement  1999; 2004; 03/11/2012    "2 + 1 + 1; total of 4" (03/11/2012)  . Appendectomy  ~ 1965  . Tonsillectomy and adenoidectomy  ~ 1962  . Femur fracture surgery  2011    RLE (03/11/2012)  . Tubal ligation  1977  . Skin cancer excision  2013    "nose & right middle finger; actinic keratosis" (03/11/2012)  . Percutaneous coronary stent intervention (pci-s) N/A 03/11/2012    Procedure: PERCUTANEOUS CORONARY STENT INTERVENTION (PCI-S);  Surgeon: Sherren Mocha, MD;  Location: [facility] CATH LAB;  Service: Cardiovascular;  Laterality: N/A;   Family History  Problem Relation Age of Onset  . Colon cancer Brother 68  . Lung cancer Sister   . Lung cancer  Brother   . Stomach cancer Brother   . Diabetes Daughter    History  Substance Use Topics  . Smoking status: Current Every Day Smoker -- 1.00 packs/day for 51 years    Types: Cigarettes  . Smokeless tobacco: Never Used  . Alcohol Use: No   OB History    No data available     Review of Systems As in history of present illness Allergies  Codeine and Morphine and related  Home Medications   Prior to Admission medications   Medication Sig Start Date End Date Taking? Authorizing Provider  acetaminophen (TYLENOL) 325 MG tablet Take 650 mg by mouth every 6 (six) hours as needed. For pain    Historical Provider, MD  aspirin 81 MG tablet Take 1 tablet (81 mg total) by mouth daily. 01/21/14   Annita Brod, MD  atorvastatin (LIPITOR) 80 MG tablet Take 80 mg by mouth daily.    Historical Provider, MD  clopidogrel (PLAVIX) 75 MG tablet Take 75 mg by mouth daily.    Historical Provider, MD  escitalopram (LEXAPRO) 10 MG tablet Take 10 mg by mouth daily.  03/02/12   Historical Provider, MD  esomeprazole (NEXIUM) 40 MG capsule Take 40 mg by mouth daily at 12 noon.    Historical Provider, MD  HYDROcodone-acetaminophen ([place]) 5-325 MG  per tablet Take 1 tablet by mouth every 6 (six) hours as needed for moderate pain. 09/10/14   Melony Overly, MD  ibuprofen (ADVIL,MOTRIN) 200 MG tablet Take 400 mg by mouth every 6 (six) hours as needed (for pain).    Historical Provider, MD  insulin glargine (LANTUS) 100 UNIT/ML injection Inject 0.3 mLs (30 Units total) into the skin 2 (two) times daily. 01/21/14   Annita Brod, MD  JANUVIA 100 MG tablet Take 100 mg by mouth daily. 11/03/13   Historical Provider, MD  meloxicam (MOBIC) 15 MG tablet Take 1 tablet (15 mg total) by mouth daily. 03/12/12   Dayna N Dunn, PA-C  metFORMIN (GLUCOPHAGE) 1000 MG tablet Take 1,000 mg by mouth 2 (two) times daily with a meal.    Historical Provider, MD  metoprolol succinate (TOPROL-XL) 50 MG 24 hr tablet Take 50 mg by mouth  daily. Take with or immediately following a meal.    Historical Provider, MD  nicotine (NICODERM CQ - DOSED IN MG/24 HOURS) 21 mg/24hr patch Place 1 patch (21 mg total) onto the skin daily. 01/21/14   Annita Brod, MD  nitroGLYCERIN (NITROSTAT) 0.4 MG SL tablet Place 1 tablet (0.4 mg total) under the tongue every 5 (five) minutes as needed (up to 3 doses). For chest pain 03/12/12   Dayna N Dunn, PA-C  ondansetron (ZOFRAN) 4 MG tablet Take 1 tablet (4 mg total) by mouth every 8 (eight) hours as needed for nausea or vomiting. 09/10/14   Melony Overly, MD   BP 158/83 mmHg  Pulse 69  Temp(Src) 98.8 F (37.1 C) (Oral)  Resp 12  SpO2 100% Physical Exam  Constitutional: Katherine Cole is oriented to person, place, and time. Katherine Cole appears well-developed and well-nourished. Katherine Cole appears distressed (looks uncomfortable).  Cardiovascular: Normal rate.   Pulmonary/Chest: Effort normal.  Musculoskeletal:  Katherine Cole has some swelling over the right lateral ribs. Katherine Cole is very tender in this area.  Neurological: Katherine Cole is alert and oriented to person, place, and time.    ED Course  Procedures (including critical care time) Labs Review Labs Reviewed - No data to display  Imaging Review Dg Ribs Unilateral W/chest Right  09/10/2014   CLINICAL DATA:  Right-sided chest pain after being hugged too hard.  EXAM: RIGHT RIBS AND CHEST - 3+ VIEW  COMPARISON:  Chest x-ray dated 01/20/2014  FINDINGS: There is a minimally displaced fracture of the anterior lateral aspect of the right tenth rib.  Right ribs are otherwise normal.  Heart size and pulmonary vascularity are normal. Calcification in the arch of the aorta. Lungs are clear.  IMPRESSION: Nondisplaced fracture of the anterior lateral aspect of the right tenth rib.   Electronically Signed   By: Lorriane Shire M.D.   On: 09/10/2014 14:35     MDM   1. Rib fracture, right, closed, initial encounter    DEXA scan reviewed and Katherine Cole does have osteoporosis. X-ray with a rib  fracture. Prescription for Norco and Zofran given. Discussed importance of taking deep breaths. Follow-up with PCP in 1-2 weeks for recheck.    Melony Overly, MD 09/10/14 [phone]

## 2014-10-25 ENCOUNTER — Other Ambulatory Visit: Payer: Self-pay | Admitting: Internal Medicine

## 2014-10-25 DIAGNOSIS — Z0000 Encounter for general adult medical examination without abnormal findings: Secondary | ICD-10-CM

## 2015-05-29 ENCOUNTER — Other Ambulatory Visit: Payer: Self-pay

## 2015-05-29 ENCOUNTER — Encounter (HOSPITAL_COMMUNITY): Payer: Self-pay

## 2015-05-29 ENCOUNTER — Emergency Department (HOSPITAL_COMMUNITY): Payer: Medicare Other

## 2015-05-29 DIAGNOSIS — Z85828 Personal history of other malignant neoplasm of skin: Secondary | ICD-10-CM | POA: Insufficient documentation

## 2015-05-29 DIAGNOSIS — Z8673 Personal history of transient ischemic attack (TIA), and cerebral infarction without residual deficits: Secondary | ICD-10-CM | POA: Diagnosis not present

## 2015-05-29 DIAGNOSIS — R531 Weakness: Secondary | ICD-10-CM | POA: Diagnosis not present

## 2015-05-29 DIAGNOSIS — Z791 Long term (current) use of non-steroidal anti-inflammatories (NSAID): Secondary | ICD-10-CM | POA: Diagnosis not present

## 2015-05-29 DIAGNOSIS — Z86010 Personal history of colonic polyps: Secondary | ICD-10-CM | POA: Diagnosis not present

## 2015-05-29 DIAGNOSIS — Z9861 Coronary angioplasty status: Secondary | ICD-10-CM | POA: Insufficient documentation

## 2015-05-29 DIAGNOSIS — F329 Major depressive disorder, single episode, unspecified: Secondary | ICD-10-CM | POA: Insufficient documentation

## 2015-05-29 DIAGNOSIS — I10 Essential (primary) hypertension: Secondary | ICD-10-CM | POA: Insufficient documentation

## 2015-05-29 DIAGNOSIS — Z7902 Long term (current) use of antithrombotics/antiplatelets: Secondary | ICD-10-CM | POA: Diagnosis not present

## 2015-05-29 DIAGNOSIS — Z794 Long term (current) use of insulin: Secondary | ICD-10-CM | POA: Diagnosis not present

## 2015-05-29 DIAGNOSIS — E785 Hyperlipidemia, unspecified: Secondary | ICD-10-CM | POA: Diagnosis not present

## 2015-05-29 DIAGNOSIS — Z7984 Long term (current) use of oral hypoglycemic drugs: Secondary | ICD-10-CM | POA: Diagnosis not present

## 2015-05-29 DIAGNOSIS — Z79899 Other long term (current) drug therapy: Secondary | ICD-10-CM | POA: Diagnosis not present

## 2015-05-29 DIAGNOSIS — Z7982 Long term (current) use of aspirin: Secondary | ICD-10-CM | POA: Diagnosis not present

## 2015-05-29 DIAGNOSIS — M79662 Pain in left lower leg: Secondary | ICD-10-CM | POA: Insufficient documentation

## 2015-05-29 DIAGNOSIS — R0602 Shortness of breath: Secondary | ICD-10-CM | POA: Diagnosis not present

## 2015-05-29 DIAGNOSIS — F17210 Nicotine dependence, cigarettes, uncomplicated: Secondary | ICD-10-CM | POA: Diagnosis not present

## 2015-05-29 DIAGNOSIS — K219 Gastro-esophageal reflux disease without esophagitis: Secondary | ICD-10-CM | POA: Insufficient documentation

## 2015-05-29 DIAGNOSIS — E119 Type 2 diabetes mellitus without complications: Secondary | ICD-10-CM | POA: Diagnosis not present

## 2015-05-29 DIAGNOSIS — I2510 Atherosclerotic heart disease of native coronary artery without angina pectoris: Secondary | ICD-10-CM | POA: Diagnosis not present

## 2015-05-29 DIAGNOSIS — M79609 Pain in unspecified limb: Secondary | ICD-10-CM | POA: Diagnosis not present

## 2015-05-29 DIAGNOSIS — R200 Anesthesia of skin: Secondary | ICD-10-CM | POA: Diagnosis present

## 2015-05-29 DIAGNOSIS — R202 Paresthesia of skin: Secondary | ICD-10-CM | POA: Diagnosis not present

## 2015-05-29 DIAGNOSIS — R42 Dizziness and giddiness: Secondary | ICD-10-CM | POA: Insufficient documentation

## 2015-05-29 DIAGNOSIS — M1990 Unspecified osteoarthritis, unspecified site: Secondary | ICD-10-CM | POA: Insufficient documentation

## 2015-05-29 LAB — DIFFERENTIAL
BASOS ABS: 0.1 10*3/uL (ref 0.0–0.1)
Basophils Relative: 1 %
Eosinophils Absolute: 0.1 10*3/uL (ref 0.0–0.7)
Eosinophils Relative: 1 %
LYMPHS PCT: 40 %
Lymphs Abs: 4.9 10*3/uL — ABNORMAL HIGH (ref 0.7–4.0)
MONO ABS: 0.7 10*3/uL (ref 0.1–1.0)
MONOS PCT: 6 %
NEUTROS ABS: 6.6 10*3/uL (ref 1.7–7.7)
Neutrophils Relative %: 52 %

## 2015-05-29 LAB — I-STAT CHEM 8, ED
BUN: 17 mg/dL (ref 6–20)
Calcium, Ion: 1.19 mmol/L (ref 1.13–1.30)
Chloride: 101 mmol/L (ref 101–111)
Creatinine, Ser: 0.9 mg/dL (ref 0.44–1.00)
Glucose, Bld: 112 mg/dL — ABNORMAL HIGH (ref 65–99)
HCT: 43 % (ref 36.0–46.0)
Hemoglobin: 14.6 g/dL (ref 12.0–15.0)
Potassium: 4 mmol/L (ref 3.5–5.1)
Sodium: 140 mmol/L (ref 135–145)
TCO2: 26 mmol/L (ref 0–100)

## 2015-05-29 LAB — COMPREHENSIVE METABOLIC PANEL
ALT: 15 U/L (ref 14–54)
AST: 20 U/L (ref 15–41)
Albumin: 3.7 g/dL (ref 3.5–5.0)
Alkaline Phosphatase: 74 U/L (ref 38–126)
Anion gap: 11 (ref 5–15)
BUN: 14 mg/dL (ref 6–20)
CO2: 24 mmol/L (ref 22–32)
Calcium: 9.6 mg/dL (ref 8.9–10.3)
Chloride: 103 mmol/L (ref 101–111)
Creatinine, Ser: 0.93 mg/dL (ref 0.44–1.00)
GFR calc Af Amer: >60 mL/min (ref >60)
GFR calc non Af Amer: 60 mL/min — ABNORMAL LOW (ref >60)
Glucose, Bld: 116 mg/dL — ABNORMAL HIGH (ref 65–99)
Potassium: 4.1 mmol/L (ref 3.5–5.1)
Sodium: 138 mmol/L (ref 135–145)
Total Bilirubin: 0.2 mg/dL — ABNORMAL LOW (ref 0.3–1.2)
Total Protein: 7.5 g/dL (ref 6.5–8.1)

## 2015-05-29 LAB — PROTIME-INR
INR: 0.95 (ref 0.00–1.49)
Prothrombin Time: 12.8 seconds (ref 11.6–15.2)

## 2015-05-29 LAB — CBC
HEMATOCRIT: 38.2 % (ref 36.0–46.0)
Hemoglobin: 13 g/dL (ref 12.0–15.0)
MCH: 29.9 pg (ref 26.0–34.0)
MCHC: 34 g/dL (ref 30.0–36.0)
MCV: 87.8 fL (ref 78.0–100.0)
Platelets: 324 10*3/uL (ref 150–400)
RBC: 4.35 MIL/uL (ref 3.87–5.11)
RDW: 13.9 % (ref 11.5–15.5)
WBC: 12.3 10*3/uL — AB (ref 4.0–10.5)

## 2015-05-29 LAB — I-STAT TROPONIN, ED: Troponin i, poc: 0 ng/mL (ref 0.00–0.08)

## 2015-05-29 LAB — APTT: aPTT: 26 seconds (ref 24–37)

## 2015-05-29 NOTE — ED Notes (Signed)
Repeat EKG completed via verbal orders Dr. Ralene Bathe

## 2015-05-29 NOTE — ED Notes (Signed)
Patient here with left arm numbness and left leg pain since early am, reports some intermittent shortness of breath for a few days, alert and oriented, no neuro deficits noted other than slight left hand grip a litle weaker.

## 2015-05-30 ENCOUNTER — Ambulatory Visit (EMERGENCY_DEPARTMENT_HOSPITAL)
Admission: RE | Admit: 2015-05-30 | Discharge: 2015-05-30 | Disposition: A | Discharge status: Home / Self Care | Payer: Medicare Other | Source: Ambulatory Visit | Attending: Emergency Medicine | Admitting: Emergency Medicine

## 2015-05-30 ENCOUNTER — Emergency Department (HOSPITAL_COMMUNITY)
Admission: EM | Admit: 2015-05-30 | Discharge: 2015-05-30 | Disposition: A | Discharge status: Home / Self Care | Payer: Medicare Other | Attending: Emergency Medicine | Admitting: Emergency Medicine

## 2015-05-30 DIAGNOSIS — M79609 Pain in unspecified limb: Secondary | ICD-10-CM | POA: Diagnosis not present

## 2015-05-30 DIAGNOSIS — M79662 Pain in left lower leg: Secondary | ICD-10-CM

## 2015-05-30 DIAGNOSIS — R202 Paresthesia of skin: Secondary | ICD-10-CM

## 2015-05-30 HISTORY — DX: Cerebral infarction, unspecified: I63.9

## 2015-05-30 MED ORDER — HYDROCODONE-ACETAMINOPHEN 5-325 MG PO TABS
1.0000 | ORAL_TABLET | Freq: Once | ORAL | Status: AC
  Administered 2015-05-30: 1 via ORAL
  Filled 2015-05-30: qty 1

## 2015-05-30 MED ORDER — HYDROCODONE-ACETAMINOPHEN 5-325 MG PO TABS: 1.0000 | ORAL_TABLET | Freq: Four times a day (QID) | ORAL | Status: DC | PRN

## 2015-05-30 MED ORDER — RIVAROXABAN 15 MG PO TABS
15.0000 mg | ORAL_TABLET | Freq: Once | ORAL | Status: AC
  Administered 2015-05-30: 15 mg via ORAL
  Filled 2015-05-30: qty 1

## 2015-05-30 NOTE — ED Notes (Signed)
Pt states that he pain and numbness started on Sunday.

## 2015-05-30 NOTE — ED Provider Notes (Signed)
CSN: [id number]     Arrival date & time 05/29/15  1826 History  By signing my name below, I, Rowan Blase, attest that this documentation has been prepared under the direction and in the presence of Delora Fuel, MD . Electronically Signed: Rowan Blase, Scribe. 05/30/2015. 1:25 AM.   Chief Complaint  Patient presents with  . Numbness  . Shortness of Breath   The history is provided by the patient and a relative. No language interpreter was used.   HPI Comments:  ADELENE POLIVKA is a 75 y.o. female with PMHx of HLD, HTN, CAD, DM and stroke who presents to the Emergency Department complaining of worsening, 7/10 left calf pain onset a week ago, worsening yesterday. She notes pain is worse when walking. Pt took Ibuprofen and used pain cream with no relief. Pt reports associated intermittent left arm and finger numbness and weakness onset since stroke in October 2015; she notes episodes of numbness last ~ 10 minutes. Daughter reports pt has been dizzy and unstable recently. Pt is right handed. Pt denies recent trauma and any other symptoms at this time.  Past Medical History  Diagnosis Date  . Colon polyps     adenomatous  . GERD (gastroesophageal reflux disease)   . Hyperlipemia   . Depression   . Hypertension   . Skin cancer 2012    "nose; right middle finger"  . CAD (coronary artery disease)     a. MI 1999 with 2 stents at that time followed by stenting 3-4 yrs later. b. s/p DES to LAD guided by pressure wire 03/11/12.  . Diabetes mellitus, type 2 ([place])   . Osteoarthritis   . Arthritis     "in my fingers"   . Stroke [facility])    Past Surgical History  Procedure Laterality Date  . Coronary angioplasty with stent placement  1999; 2004; 03/11/2012    "2 + 1 + 1; total of 4" (03/11/2012)  . Appendectomy  ~ 1965  . Tonsillectomy and adenoidectomy  ~ 1962  . Femur fracture surgery  2011    RLE (03/11/2012)  . Tubal ligation  1977  . Skin cancer excision  2013    "nose & right middle  finger; actinic keratosis" (03/11/2012)  . Percutaneous coronary stent intervention (pci-s) N/A 03/11/2012    Procedure: PERCUTANEOUS CORONARY STENT INTERVENTION (PCI-S);  Surgeon: Sherren Mocha, MD;  Location: [facility] CATH LAB;  Service: Cardiovascular;  Laterality: N/A;   Family History  Problem Relation Age of Onset  . Colon cancer Brother 17  . Lung cancer Sister   . Lung cancer Brother   . Stomach cancer Brother   . Diabetes Daughter    Social History  Substance Use Topics  . Smoking status: Current Every Day Smoker -- 1.00 packs/day for 51 years    Types: Cigarettes  . Smokeless tobacco: Never Used  . Alcohol Use: No   OB History    No data available     Review of Systems  Musculoskeletal: Positive for myalgias.  Neurological: Positive for dizziness, weakness and numbness.  All other systems reviewed and are negative.  Allergies  Codeine and Morphine and related  Home Medications   Prior to Admission medications   Medication Sig Start Date End Date Taking? Authorizing Provider  acetaminophen (TYLENOL) 325 MG tablet Take 650 mg by mouth every 6 (six) hours as needed. For pain    Historical Provider, MD  aspirin 81 MG tablet Take 1 tablet (81 mg total) by mouth daily.  01/21/14   Annita Brod, MD  atorvastatin (LIPITOR) 80 MG tablet Take 80 mg by mouth daily.    Historical Provider, MD  clopidogrel (PLAVIX) 75 MG tablet Take 75 mg by mouth daily.    Historical Provider, MD  escitalopram (LEXAPRO) 10 MG tablet Take 10 mg by mouth daily.  03/02/12   Historical Provider, MD  esomeprazole (NEXIUM) 40 MG capsule Take 40 mg by mouth daily at 12 noon.    Historical Provider, MD  HYDROcodone-acetaminophen (NORCO) 5-325 MG per tablet Take 1 tablet by mouth every 6 (six) hours as needed for moderate pain. 09/10/14   Melony Overly, MD  ibuprofen (ADVIL,MOTRIN) 200 MG tablet Take 400 mg by mouth every 6 (six) hours as needed (for pain).    Historical Provider, MD  insulin glargine  (LANTUS) 100 UNIT/ML injection Inject 0.3 mLs (30 Units total) into the skin 2 (two) times daily. 01/21/14   Annita Brod, MD  JANUVIA 100 MG tablet Take 100 mg by mouth daily. 11/03/13   Historical Provider, MD  meloxicam (MOBIC) 15 MG tablet Take 1 tablet (15 mg total) by mouth daily. 03/12/12   Dayna N Dunn, PA-C  metFORMIN (GLUCOPHAGE) 1000 MG tablet Take 1,000 mg by mouth 2 (two) times daily with a meal.    Historical Provider, MD  metoprolol succinate (TOPROL-XL) 50 MG 24 hr tablet Take 50 mg by mouth daily. Take with or immediately following a meal.    Historical Provider, MD  nicotine (NICODERM CQ - DOSED IN MG/24 HOURS) 21 mg/24hr patch Place 1 patch (21 mg total) onto the skin daily. 01/21/14   Annita Brod, MD  nitroGLYCERIN (NITROSTAT) 0.4 MG SL tablet Place 1 tablet (0.4 mg total) under the tongue every 5 (five) minutes as needed (up to 3 doses). For chest pain 03/12/12   Dayna N Dunn, PA-C  ondansetron (ZOFRAN) 4 MG tablet Take 1 tablet (4 mg total) by mouth every 8 (eight) hours as needed for nausea or vomiting. 09/10/14   Melony Overly, MD   BP 182/67 mmHg  Pulse 69  Temp(Src) 98.5 F (36.9 C) (Oral)  Resp 16  SpO2 100% Physical Exam  Constitutional: She is oriented to person, place, and time. She appears well-developed and well-nourished. No distress.  HENT:  Head: Normocephalic and atraumatic.  Right Ear: Hearing normal.  Left Ear: Hearing normal.  Nose: Nose normal.  Mouth/Throat: Oropharynx is clear and moist and mucous membranes are normal.  Eyes: Conjunctivae and EOM are normal. Pupils are equal, round, and reactive to light.  Neck: Normal range of motion. Neck supple. No JVD present.  Cardiovascular: Regular rhythm, S1 normal and S2 normal.  Exam reveals no gallop and no friction rub.   No murmur heard. Pulmonary/Chest: Effort normal and breath sounds normal. She has no wheezes. She has no rales. She exhibits no tenderness.  Abdominal: Soft. Normal appearance  and bowel sounds are normal. She exhibits no distension and no mass. There is no hepatosplenomegaly. There is no tenderness. There is no tenderness at McBurney's point and negative Murphy's sign. No hernia.  Musculoskeletal: Normal range of motion. She exhibits no edema.  Mild TTP to proximal left calf with positive Homan sign; no cord, erythema or warmth; calf circumference is symmetric; moderate thenar wasting of left hand  Lymphadenopathy:    She has no cervical adenopathy.  Neurological: She is alert and oriented to person, place, and time. No cranial nerve deficit. She exhibits normal muscle tone. Coordination normal.  Slight decreased sensation to palmar aspect of left hand; mild weakness of left arm with strengh 4+/5; no pronator drift;   Skin: Skin is warm, dry and intact. No rash noted. No cyanosis.  Psychiatric: She has a normal mood and affect. Her speech is normal and behavior is normal. Judgment and thought content normal.  Nursing note and vitals reviewed.   ED Course  Procedures  DIAGNOSTIC STUDIES:  Oxygen Saturation is 100% on RA, normal by my interpretation.    COORDINATION OF CARE:  1:12 AM Will administer blood thinner and have pt return tomorrow for doppler study. Will administer pain medication. Discussed treatment plan with pt at bedside and pt agreed to plan.  Labs Review Labs Reviewed  CBC - Abnormal; Notable for the following:    WBC 12.3 (*)    All other components within normal limits  DIFFERENTIAL - Abnormal; Notable for the following:    Lymphs Abs 4.9 (*)    All other components within normal limits  COMPREHENSIVE METABOLIC PANEL - Abnormal; Notable for the following:    Glucose, Bld 116 (*)    Total Bilirubin 0.2 (*)    GFR calc non Af Amer 60 (*)    All other components within normal limits  I-STAT CHEM 8, ED - Abnormal; Notable for the following:    Glucose, Bld 112 (*)    All other components within normal limits  PROTIME-INR  APTT  I-STAT  TROPOININ, ED    Imaging Review Ct Head Wo Contrast  05/29/2015  CLINICAL DATA:  Left leg pain and weakness for about 1 week. EXAM: CT HEAD WITHOUT CONTRAST TECHNIQUE: Contiguous axial images were obtained from the base of the skull through the vertex without intravenous contrast. COMPARISON:  MRI from 01/20/2014.  Head CT from 01/20/2014. FINDINGS: There is no evidence for acute hemorrhage, hydrocephalus, mass lesion, or abnormal extra-axial fluid collection. No definite CT evidence for acute infarction. Lacunar infarct in the right thalamus was seen to be acute on the previous MRI. Patchy low attenuation in the deep hemispheric and periventricular white matter is nonspecific, but likely reflects chronic microvascular ischemic demyelination. The visualized paranasal sinuses and mastoid air cells are clear. IMPRESSION: No acute intracranial abnormality. 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 Electronically Signed   By: Misty Stanley M.D.   On: 05/29/2015 22:38   I have personally reviewed and evaluated these images and lab results as part of my medical decision-making.   EKG Interpretation   Date/Time:  Monday May 29 2015 18:36:07 EST Ventricular Rate:  73 PR Interval:  128 QRS Duration: 70 QT Interval:  390 QTC Calculation: 429 R Axis:   77 Text Interpretation:  Normal sinus rhythm ST' \\T'$ \ T wave abnormality,  consider inferior ischemia Abnormal ECG When compared with ECG of  01/20/2014, Nonspecific ST and T wave abnormality is now Present Confirmed  by [facility]  MD, Cherre Kothari ([id number]) on 05/30/2015 12:51:34 AM      MDM   Final diagnoses:  Pain of left calf  Paresthesia of left arm    Left calf pain of uncertain cause. Exam is benign except for tenderness, but she will still need to be sent for venous Doppler to rule out DVT. Left arm paresthesias uncertain cause. However, they are intermittent and can be evaluated further as an  outpatient. She is given a dose of rivaroxaban and told to return in the morning for venous Doppler studies. She is also given a prescription  for hydrocodone-acetaminophen for pain control.  I personally performed the services described in this documentation, which was scribed in my presence. The recorded information has been reviewed and is accurate.      Delora Fuel, MD [id number]

## 2015-05-30 NOTE — Progress Notes (Signed)
VASCULAR LAB PRELIMINARY  PRELIMINARY  PRELIMINARY  PRELIMINARY  Left lower extremity venous duplex has been completed.      Left:  No evidence of DVT, superficial thrombosis, or Baker's cyst.   Janifer Adie, RVT, RDMS 05/30/2015, 12:45 PM

## 2015-05-30 NOTE — Discharge Instructions (Signed)
Return in the morning for vascular ultrasound to rule out blood clots in your leg.  Acetaminophen; Hydrocodone tablets or capsules What is this medicine? ACETAMINOPHEN; HYDROCODONE (a set a MEE noe fen; hye droe KOE done) is a pain reliever. It is used to treat moderate to severe pain. This medicine may be used for other purposes; ask your health care provider or pharmacist if you have questions. What should I tell my health care provider before I take this medicine? They need to know if you have any of these conditions: -brain tumor -Crohn's disease, inflammatory bowel disease, or ulcerative colitis -drug abuse or addiction -head injury -heart or circulation problems -if you often drink alcohol -kidney disease or problems going to the bathroom -liver disease -lung disease, asthma, or breathing problems -an unusual or allergic reaction to acetaminophen, hydrocodone, other opioid analgesics, other medicines, foods, dyes, or preservatives -pregnant or trying to get pregnant -breast-feeding How should I use this medicine? Take this medicine by mouth. Swallow it with a full glass of water. Follow the directions on the prescription label. If the medicine upsets your stomach, take the medicine with food or milk. Do not take more than you are told to take. Talk to your pediatrician regarding the use of this medicine in children. This medicine is not approved for use in children. Patients over 65 years may have a stronger reaction and need a smaller dose. Overdosage: If you think you have taken too much of this medicine contact a poison control center or emergency room at once. NOTE: This medicine is only for you. Do not share this medicine with others. What if I miss a dose? If you miss a dose, take it as soon as you can. If it is almost time for your next dose, take only that dose. Do not take double or extra doses. What may interact with this  medicine? -alcohol -antihistamines -isoniazid -medicines for depression, anxiety, or psychotic disturbances -medicines for sleep -muscle relaxants -naltrexone -narcotic medicines (opiates) for pain -phenobarbital -ritonavir -tramadol This list may not describe all possible interactions. Give your health care provider a list of all the medicines, herbs, non-prescription drugs, or dietary supplements you use. Also tell them if you smoke, drink alcohol, or use illegal drugs. Some items may interact with your medicine. What should I watch for while using this medicine? Tell your doctor or health care professional if your pain does not go away, if it gets worse, or if you have new or a different type of pain. You may develop tolerance to the medicine. Tolerance means that you will need a higher dose of the medicine for pain relief. Tolerance is normal and is expected if you take the medicine for a long time. Do not suddenly stop taking your medicine because you may develop a severe reaction. Your body becomes used to the medicine. This does NOT mean you are addicted. Addiction is a behavior related to getting and using a drug for a non-medical reason. If you have pain, you have a medical reason to take pain medicine. Your doctor will tell you how much medicine to take. If your doctor wants you to stop the medicine, the dose will be slowly lowered over time to avoid any side effects. You may get drowsy or dizzy when you first start taking the medicine or change doses. Do not drive, use machinery, or do anything that may be dangerous until you know how the medicine affects you. Stand or sit up slowly. There are different  types of narcotic medicines (opiates) for pain. If you take more than one type at the same time, you may have more side effects. Give your health care provider a list of all medicines you use. Your doctor will tell you how much medicine to take. Do not take more medicine than directed.  Call emergency for help if you have problems breathing. The medicine will cause constipation. Try to have a bowel movement at least every 2 to 3 days. If you do not have a bowel movement for 3 days, call your doctor or health care professional. Too much acetaminophen can be very dangerous. Do not take Tylenol (acetaminophen) or medicines that contain acetaminophen with this medicine. Many non-prescription medicines contain acetaminophen. Always read the labels carefully. What side effects may I notice from receiving this medicine? Side effects that you should report to your doctor or health care professional as soon as possible: -allergic reactions like skin rash, itching or hives, swelling of the face, lips, or tongue -breathing problems -confusion -feeling faint or lightheaded, falls -stomach pain -yellowing of the eyes or skin Side effects that usually do not require medical attention (report to your doctor or health care professional if they continue or are bothersome): -nausea, vomiting -stomach upset This list may not describe all possible side effects. Call your doctor for medical advice about side effects. You may report side effects to FDA at 1-800-FDA-1088. Where should I keep my medicine? Keep out of the reach of children. This medicine can be abused. Keep your medicine in a safe place to protect it from theft. Do not share this medicine with anyone. Selling or giving away this medicine is dangerous and against the law. This medicine may cause accidental overdose and death if it taken by other adults, children, or pets. Mix any unused medicine with a substance like cat litter or coffee grounds. Then throw the medicine away in a sealed container like a sealed bag or a coffee can with a lid. Do not use the medicine after the expiration date. Store at room temperature between 15 and 30 degrees C (59 and 86 degrees F). NOTE: This sheet is a summary. It may not cover all possible  information. If you have questions about this medicine, talk to your doctor, pharmacist, or health care provider.    2016, Elsevier/Gold Standard. (2014-02-23 15:29:20)

## 2015-07-11 ENCOUNTER — Other Ambulatory Visit: Payer: Self-pay | Admitting: Internal Medicine

## 2015-07-11 DIAGNOSIS — Z1231 Encounter for screening mammogram for malignant neoplasm of breast: Secondary | ICD-10-CM

## 2015-07-18 ENCOUNTER — Telehealth: Payer: Self-pay | Admitting: Cardiology

## 2015-07-18 NOTE — Telephone Encounter (Signed)
Received records from [facility] for appointment on 08/11/15 with Dr Percival Spanish.  Records given to [facility] (medical records) for Dr Hochrein's schedule on 08/11/15.

## 2015-07-20 ENCOUNTER — Encounter: Payer: Self-pay | Admitting: Internal Medicine

## 2015-08-02 ENCOUNTER — Ambulatory Visit: Payer: Medicare Other

## 2015-08-09 ENCOUNTER — Ambulatory Visit
Admission: RE | Admit: 2015-08-09 | Discharge: 2015-08-09 | Disposition: A | Discharge status: Home / Self Care | Payer: Medicare Other | Source: Ambulatory Visit | Attending: Internal Medicine | Admitting: Internal Medicine

## 2015-08-09 DIAGNOSIS — Z1231 Encounter for screening mammogram for malignant neoplasm of breast: Secondary | ICD-10-CM

## 2015-08-10 NOTE — Progress Notes (Signed)
HPI  The patient presents for evaluation of CAD with prior PCI and DES to the LAD in December of 2013. Since I last saw her she was in the hospital with a CVA in 2015.  I looked at these records.  She has had no other cardiovascular complicaitons.  However, she has many ongoing risk factors.   The patient denies any new symptoms such as chest discomfort, neck or arm discomfort. There has been no new shortness of breath, PND or orthopnea. There have been no reported palpitations, presyncope or syncope.  She unfortunately is still smoking.  She does get leg pain which comes on at times with walking and at times at rest.   Allergies  Allergen Reactions  . Codeine Other (See Comments)    "gives me a migraine" (03/11/2012)  . Morphine And Related Other (See Comments)    "gives me a migraine" (03/11/2012)    Current Outpatient Prescriptions  Medication Sig Dispense Refill  . acetaminophen (TYLENOL) 325 MG tablet Take 650 mg by mouth every 6 (six) hours as needed. For pain    . aspirin 81 MG tablet Take 1 tablet (81 mg total) by mouth daily. 30 tablet 1  . atorvastatin (LIPITOR) 80 MG tablet Take 80 mg by mouth daily.    . clopidogrel (PLAVIX) 75 MG tablet Take 75 mg by mouth daily.    Marland Kitchen escitalopram (LEXAPRO) 10 MG tablet Take 10 mg by mouth daily.     Marland Kitchen esomeprazole (NEXIUM) 40 MG capsule Take 40 mg by mouth daily at 12 noon.    Marland Kitchen HYDROcodone-acetaminophen (NORCO) 5-325 MG tablet Take 1 tablet by mouth every 6 (six) hours as needed for moderate pain. 15 tablet 0  . ibuprofen (ADVIL,MOTRIN) 200 MG tablet Take 400 mg by mouth every 6 (six) hours as needed (for pain).    . insulin glargine (LANTUS) 100 UNIT/ML injection Inject 0.3 mLs (30 Units total) into the skin 2 (two) times daily. 10 mL 11  . JANUVIA 100 MG tablet Take 100 mg by mouth daily.    . meloxicam (MOBIC) 15 MG tablet Take 1 tablet (15 mg total) by mouth daily.    . metFORMIN (GLUCOPHAGE) 1000 MG tablet Take 1,000 mg by mouth 2  (two) times daily with a meal.    . metoprolol succinate (TOPROL-XL) 50 MG 24 hr tablet Take 50 mg by mouth daily. Take with or immediately following a meal.    . nicotine (NICODERM CQ - DOSED IN MG/24 HOURS) 21 mg/24hr patch Place 1 patch (21 mg total) onto the skin daily. 28 patch 0  . nitroGLYCERIN (NITROSTAT) 0.4 MG SL tablet Place 1 tablet (0.4 mg total) under the tongue every 5 (five) minutes as needed (up to 3 doses). For chest pain    . ondansetron (ZOFRAN) 4 MG tablet Take 1 tablet (4 mg total) by mouth every 8 (eight) hours as needed for nausea or vomiting. 20 tablet 0   No current facility-administered medications for this visit.    Past Medical History  Diagnosis Date  . Colon polyps     adenomatous  . GERD (gastroesophageal reflux disease)   . Hyperlipemia   . Depression   . Hypertension   . Skin cancer 2012    "nose; right middle finger"  . CAD (coronary artery disease)     a. MI 1999 with 2 stents at that time followed by stenting 3-4 yrs later. b. s/p DES to LAD guided by pressure wire 03/11/12.  Marland Kitchen  Diabetes mellitus, type 2 ([place])   . Osteoarthritis   . Arthritis     "in my fingers"   . Stroke [facility])     Past Surgical History  Procedure Laterality Date  . Coronary angioplasty with stent placement  1999; 2004; 03/11/2012    "2 + 1 + 1; total of 4" (03/11/2012)  . Appendectomy  ~ 1965  . Tonsillectomy and adenoidectomy  ~ 1962  . Femur fracture surgery  2011    RLE (03/11/2012)  . Tubal ligation  1977  . Skin cancer excision  2013    "nose & right middle finger; actinic keratosis" (03/11/2012)  . Percutaneous coronary stent intervention (pci-s) N/A 03/11/2012    Procedure: PERCUTANEOUS CORONARY STENT INTERVENTION (PCI-S);  Surgeon: Sherren Mocha, MD;  Location: [facility] CATH LAB;  Service: Cardiovascular;  Laterality: N/A;    ROS:  As stated in the HPI and negative for all other systems.  PHYSICAL EXAM BP 146/68 mmHg  Pulse 70  Ht '5\' 1"'$  (1.549 m)  Wt 99 lb (44.906  kg)  BMI 18.72 kg/m2 GENERAL:  Well appearing HEENT:  Pupils equal round and reactive, fundi not visualized, oral mucosa unremarkable, dentures. NECK:  No jugular venous distention, waveform within normal limits, carotid upstroke brisk and symmetric, no bruits, no thyromegaly LYMPHATICS:  No cervical, inguinal adenopathy LUNGS:  Clear to auscultation bilaterally BACK:  No CVA tenderness CHEST:  Unremarkable HEART:  PMI not displaced or sustained,S1 and S2 within normal limits, no S3, no S4, no clicks, no rubs, no murmurs ABD:  Flat, positive bowel sounds normal in frequency in pitch, positive bruits, no rebound, no guarding, no midline pulsatile mass, no hepatomegaly, no splenomegaly EXT:  2 plus pulses upper, decreased DP/PT bilateral lower, no edema, no cyanosis no clubbing SKIN:  No rashes no nodules NEURO:  Cranial nerves II through XII grossly intact, motor grossly intact throughout PSYCH:  Cognitively intact, oriented to person place and time  EKG:  Sinus rhythm, rate 73, axis within normal limits, intervals within normal limits, no acute ST-T wave changes.  05/29/15   ASSESSMENT AND PLAN  CAD:  She needs better risk factor reduction but she is not really amenable to this.  I will bring the patient back for a POET (Plain Old Exercise Test). This will allow me to screen for obstructive coronary disease, risk stratify and very importantly provide a prescription for exercise.  TOBACCO ABUSE:  We have discussed this multiple times.    She does not want gum or Chantix and has failed patches.   DYSLIPIDEMIA:  I will defer to [facility] [place] R, MD.  I will repeat a lipid profile when she comes back for her POET (Plain Old Exercise Treadmill)  LEG PAIN:  She has reduced pulses.  I will check ABIs.   DM:  Her A1c was 8.1.  Per Tivis Ringer, MD   DIZZINESS:  She had orthostatic BP drops recorded in the office.  I suspect that this is causing dizziness that she describes.  We discussed  stockings and conservative therapy.   Marland Kitchen

## 2015-08-11 ENCOUNTER — Encounter: Payer: Self-pay | Admitting: Cardiology

## 2015-08-11 ENCOUNTER — Other Ambulatory Visit: Payer: Self-pay | Admitting: Internal Medicine

## 2015-08-11 ENCOUNTER — Ambulatory Visit (INDEPENDENT_AMBULATORY_CARE_PROVIDER_SITE_OTHER): Payer: Medicare Other | Admitting: Cardiology

## 2015-08-11 VITALS — BP 146/68 | HR 70 | Ht 61.0 in | Wt 99.0 lb

## 2015-08-11 DIAGNOSIS — E785 Hyperlipidemia, unspecified: Secondary | ICD-10-CM | POA: Diagnosis not present

## 2015-08-11 DIAGNOSIS — I2510 Atherosclerotic heart disease of native coronary artery without angina pectoris: Secondary | ICD-10-CM | POA: Diagnosis not present

## 2015-08-11 DIAGNOSIS — I739 Peripheral vascular disease, unspecified: Secondary | ICD-10-CM

## 2015-08-11 DIAGNOSIS — I208 Other forms of angina pectoris: Secondary | ICD-10-CM | POA: Diagnosis not present

## 2015-08-11 DIAGNOSIS — R928 Other abnormal and inconclusive findings on diagnostic imaging of breast: Secondary | ICD-10-CM

## 2015-08-11 NOTE — Patient Instructions (Signed)
Medication Instructions:  Continue current medication  Labwork: Fasting Lipids  Testing/Procedures: Your physician has requested that you have an exercise tolerance test. For further information please visit [email or web address]. Please also follow instruction sheet, as given.  Your physician has requested that you have an ankle brachial index (ABI). During this test an ultrasound and blood pressure cuff are used to evaluate the arteries that supply the arms and legs with blood. Allow thirty minutes for this exam. There are no restrictions or special instructions.   Follow-Up: Your physician wants you to follow-up in: 1 Year. You will receive a reminder letter in the mail two months in advance. If you don't receive a letter, please call our office to schedule the follow-up appointment.   Any Other Special Instructions Will Be Listed Below (If Applicable).   If you need a refill on your cardiac medications before your next appointment, please call your pharmacy.

## 2015-08-18 ENCOUNTER — Ambulatory Visit
Admission: RE | Admit: 2015-08-18 | Discharge: 2015-08-18 | Disposition: A | Discharge status: Home / Self Care | Payer: Medicare Other | Source: Ambulatory Visit | Attending: Internal Medicine | Admitting: Internal Medicine

## 2015-08-18 DIAGNOSIS — R928 Other abnormal and inconclusive findings on diagnostic imaging of breast: Secondary | ICD-10-CM

## 2015-08-21 ENCOUNTER — Other Ambulatory Visit: Payer: Self-pay | Admitting: Cardiology

## 2015-08-21 DIAGNOSIS — R0989 Other specified symptoms and signs involving the circulatory and respiratory systems: Secondary | ICD-10-CM

## 2015-08-21 DIAGNOSIS — I739 Peripheral vascular disease, unspecified: Secondary | ICD-10-CM

## 2015-08-25 ENCOUNTER — Telehealth (HOSPITAL_COMMUNITY): Payer: Self-pay

## 2015-08-25 NOTE — Telephone Encounter (Signed)
Encounter complete. 

## 2015-08-30 ENCOUNTER — Ambulatory Visit (HOSPITAL_COMMUNITY)
Admission: RE | Admit: 2015-08-30 | Discharge: 2015-08-30 | Disposition: A | Discharge status: Home / Self Care | Payer: Medicare Other | Source: Ambulatory Visit | Attending: Cardiovascular Disease | Admitting: Cardiovascular Disease

## 2015-08-30 ENCOUNTER — Ambulatory Visit (HOSPITAL_COMMUNITY)
Admission: RE | Admit: 2015-08-30 | Discharge: 2015-08-30 | Disposition: A | Discharge status: Home / Self Care | Payer: Medicare Other | Source: Ambulatory Visit | Attending: Cardiology | Admitting: Cardiology

## 2015-08-30 DIAGNOSIS — R0989 Other specified symptoms and signs involving the circulatory and respiratory systems: Secondary | ICD-10-CM

## 2015-08-30 DIAGNOSIS — I2510 Atherosclerotic heart disease of native coronary artery without angina pectoris: Secondary | ICD-10-CM | POA: Insufficient documentation

## 2015-08-30 DIAGNOSIS — E119 Type 2 diabetes mellitus without complications: Secondary | ICD-10-CM | POA: Diagnosis not present

## 2015-08-30 DIAGNOSIS — I739 Peripheral vascular disease, unspecified: Secondary | ICD-10-CM | POA: Insufficient documentation

## 2015-08-30 DIAGNOSIS — I10 Essential (primary) hypertension: Secondary | ICD-10-CM | POA: Insufficient documentation

## 2015-08-30 DIAGNOSIS — I208 Other forms of angina pectoris: Secondary | ICD-10-CM | POA: Diagnosis not present

## 2015-08-30 DIAGNOSIS — K219 Gastro-esophageal reflux disease without esophagitis: Secondary | ICD-10-CM | POA: Diagnosis not present

## 2015-08-30 DIAGNOSIS — E785 Hyperlipidemia, unspecified: Secondary | ICD-10-CM | POA: Insufficient documentation

## 2015-08-30 LAB — EXERCISE TOLERANCE TEST
CHL CUP MPHR: 148 {beats}/min
CHL CUP RESTING HR STRESS: 65 {beats}/min
CHL RATE OF PERCEIVED EXERTION: 16
CSEPEDS: 4 s
CSEPEW: 4.6 METS
CSEPHR: 67 %
Exercise duration (min): 3 min
Peak HR: 100 {beats}/min

## 2016-06-04 ENCOUNTER — Encounter: Payer: Self-pay | Admitting: Internal Medicine

## 2016-12-25 ENCOUNTER — Other Ambulatory Visit: Payer: Self-pay | Admitting: Internal Medicine

## 2016-12-25 DIAGNOSIS — Z1231 Encounter for screening mammogram for malignant neoplasm of breast: Secondary | ICD-10-CM

## 2017-01-07 ENCOUNTER — Ambulatory Visit: Payer: Medicare Other

## 2017-03-10 ENCOUNTER — Encounter (HOSPITAL_COMMUNITY): Payer: Self-pay | Admitting: *Deleted

## 2017-03-10 ENCOUNTER — Ambulatory Visit (HOSPITAL_COMMUNITY)
Admission: EM | Admit: 2017-03-10 | Discharge: 2017-03-10 | Disposition: A | Discharge status: Home / Self Care | Payer: Medicare Other | Attending: Family Medicine | Admitting: Family Medicine

## 2017-03-10 DIAGNOSIS — M79602 Pain in left arm: Secondary | ICD-10-CM | POA: Diagnosis not present

## 2017-03-10 DIAGNOSIS — M7918 Myalgia, other site: Secondary | ICD-10-CM

## 2017-03-10 DIAGNOSIS — M7582 Other shoulder lesions, left shoulder: Secondary | ICD-10-CM

## 2017-03-10 DIAGNOSIS — M25512 Pain in left shoulder: Secondary | ICD-10-CM

## 2017-03-10 DIAGNOSIS — M778 Other enthesopathies, not elsewhere classified: Secondary | ICD-10-CM

## 2017-03-10 MED ORDER — TRAMADOL HCL 50 MG PO TABS: ORAL_TABLET | ORAL | 0 refills | Status: DC

## 2017-03-10 MED ORDER — DICLOFENAC SODIUM 1 % TD GEL: 1.0000 "application " | Freq: Four times a day (QID) | TRANSDERMAL | 0 refills | Status: DC

## 2017-03-10 MED ORDER — TRIAMCINOLONE ACETONIDE 40 MG/ML IJ SUSP
INTRAMUSCULAR | Status: AC
  Filled 2017-03-10: qty 1

## 2017-03-10 MED ORDER — TRIAMCINOLONE ACETONIDE 40 MG/ML IJ SUSP
40.0000 mg | Freq: Once | INTRAMUSCULAR | Status: AC
  Administered 2017-03-10: 40 mg via INTRAMUSCULAR

## 2017-03-10 NOTE — ED Triage Notes (Signed)
Patient reports left shoulder pain x 1 day. Denies any injury. CMS intact distally.

## 2017-03-10 NOTE — Discharge Instructions (Signed)
Try ice for the first couple days after that use heat. May take the pain medicine as directed. Apply the diclofenac gel over the shoulder and upper arm for pain control. Follow-up with your doctor in the office

## 2017-03-10 NOTE — ED Provider Notes (Signed)
[place]    CSN: [id number] Arrival date & time: 03/10/17  1102     History   Chief Complaint Chief Complaint  Patient presents with  . Shoulder Pain    HPI Katherine Cole is a 76 y.o. female.   76 year old female awoke yesterday morning with left shoulder and upper outer arm pain. Unknown reason. No fall or trauma. No known work or activity that would have caused her pain. Pain is located over the deltoid muscle and to the anterior and posterior shoulder musculature. Pain is worse with abduction and other movements.      Past Medical History:  Diagnosis Date  . Arthritis    "in my fingers"   . CAD (coronary artery disease)    a. MI 1999 with 2 stents at that time followed by stenting 3-4 yrs later. b. s/p DES to LAD guided by pressure wire 03/11/12.  . Colon polyps    adenomatous  . Depression   . Diabetes mellitus, type 2 ([place])   . GERD (gastroesophageal reflux disease)   . Hyperlipemia   . Hypertension   . Osteoarthritis   . Skin cancer 2012   "nose; right middle finger"  . Stroke [facility])     Patient Active Problem List   Diagnosis Date Noted  . Acute CVA (cerebrovascular accident) ([place]) 01/20/2014  . Exertional angina ([place]) 03/12/2012  . CAD (coronary artery disease) 02/10/2012  . Tobacco abuse 02/10/2012  . Hypertension 02/10/2012  . Benign neoplasm of colon 08/01/2007  . Uncontrolled type 2 DM with peripheral circulatory disorder ([place]) 08/01/2007  . Hyperlipidemia 08/01/2007  . GASTROESOPHAGEAL REFLUX DISEASE 08/01/2007  . OSTEOARTHRITIS 08/01/2007    Past Surgical History:  Procedure Laterality Date  . APPENDECTOMY  ~ 1965  . CATARACT EXTRACTION    . CORONARY ANGIOPLASTY WITH STENT PLACEMENT  1999; 2004; 03/11/2012   "2 + 1 + 1; total of 4" (03/11/2012)  . FEMUR FRACTURE SURGERY  2011   RLE (03/11/2012)  . PERCUTANEOUS CORONARY STENT INTERVENTION (PCI-S) N/A 03/11/2012   Procedure: PERCUTANEOUS CORONARY STENT INTERVENTION (PCI-S);   Surgeon: Sherren Mocha, MD;  Location: [facility] CATH LAB;  Service: Cardiovascular;  Laterality: N/A;  . SKIN CANCER EXCISION  2013   "nose & right middle finger; actinic keratosis" (03/11/2012)  . TONSILLECTOMY AND ADENOIDECTOMY  ~ 1962  . TUBAL LIGATION  1977    OB History    No data available       Home Medications    Prior to Admission medications   Medication Sig Start Date End Date Taking? Authorizing Provider  aspirin 81 MG tablet Take 1 tablet (81 mg total) by mouth daily. 01/21/14  Yes Annita Brod, MD  atorvastatin (LIPITOR) 80 MG tablet Take 80 mg by mouth daily.   Yes [provider]  clopidogrel (PLAVIX) 75 MG tablet Take 75 mg by mouth daily.   Yes [provider]  escitalopram (LEXAPRO) 10 MG tablet Take 10 mg by mouth daily.  03/02/12  Yes [provider]  esomeprazole (NEXIUM) 40 MG capsule Take 40 mg by mouth daily at 12 noon.   Yes [provider]  insulin glargine (LANTUS) 100 UNIT/ML injection Inject 0.3 mLs (30 Units total) into the skin 2 (two) times daily. 01/21/14  Yes Annita Brod, MD  JANUVIA 100 MG tablet Take 100 mg by mouth daily. 11/03/13  Yes [provider]  metFORMIN (GLUCOPHAGE) 1000 MG tablet Take 1,000 mg by mouth 2 (two) times daily with  a meal.   Yes [provider]  metoprolol succinate (TOPROL-XL) 50 MG 24 hr tablet Take 50 mg by mouth daily. Take with or immediately following a meal.   Yes [provider]  acetaminophen (TYLENOL) 325 MG tablet Take 650 mg by mouth every 6 (six) hours as needed. For pain    [provider]  diclofenac sodium (VOLTAREN) 1 % GEL Apply 1 application topically 4 (four) times daily. 03/10/17   Janne Napoleon, NP  HYDROcodone-acetaminophen (NORCO) 5-325 MG tablet Take 1 tablet by mouth every 6 (six) hours as needed for moderate pain. 9/40/76   Delora Fuel, MD  ibuprofen (ADVIL,MOTRIN) 200 MG tablet Take 400 mg by mouth every 6 (six) hours as  needed (for pain).    [provider]  meloxicam (MOBIC) 15 MG tablet Take 1 tablet (15 mg total) by mouth daily. 03/12/12   Dunn, Nedra Hai, PA-C  nicotine (NICODERM CQ - DOSED IN MG/24 HOURS) 21 mg/24hr patch Place 1 patch (21 mg total) onto the skin daily. 01/21/14   Annita Brod, MD  nitroGLYCERIN (NITROSTAT) 0.4 MG SL tablet Place 1 tablet (0.4 mg total) under the tongue every 5 (five) minutes as needed (up to 3 doses). For chest pain 03/12/12   Dunn, Lisbeth Renshaw N, PA-C  ondansetron (ZOFRAN) 4 MG tablet Take 1 tablet (4 mg total) by mouth every 8 (eight) hours as needed for nausea or vomiting. 09/10/14   Melony Overly, MD  traMADol Veatrice Bourbon) 50 MG tablet Take 1/2 tablet every 6 hours prn pain 03/10/17   Janne Napoleon, NP    Family History Family History  Problem Relation Age of Onset  . Colon cancer Brother 53  . Lung cancer Sister   . Lung cancer Brother   . Stomach cancer Brother   . Diabetes Daughter     Social History Social History   Tobacco Use  . Smoking status: Current Every Day Smoker    Packs/day: 1.00    Years: 51.00    Pack years: 51.00    Types: Cigarettes  . Smokeless tobacco: Never Used  Substance Use Topics  . Alcohol use: No  . Drug use: No     Allergies   Codeine and Morphine and related   Review of Systems Review of Systems  Constitutional: Negative.  Negative for activity change, chills and fever.  HENT: Negative.   Respiratory: Negative.   Cardiovascular: Negative.   Musculoskeletal:       As per HPI  Skin: Negative for color change, pallor and rash.  Neurological: Negative.   All other systems reviewed and are negative.    Physical Exam Triage Vital Signs ED Triage Vitals  Enc Vitals Group     BP 03/10/17 1140 (!) 202/93     Pulse --      Resp 03/10/17 1140 17     Temp 03/10/17 1140 98.1 F (36.7 C)     Temp Source 03/10/17 1140 Oral     SpO2 03/10/17 1140 99 %     Weight --      Height --      Head Circumference --       Peak Flow --      Pain Score 03/10/17 1138 10     Pain Loc --      Pain Edu? --      Excl. in [place]? --    No data found.  Updated Vital Signs BP (!) 202/93 (BP Location: Right Arm)  Temp 98.1 F (36.7 C) (Oral)   Resp 17   SpO2 99%   Visual Acuity Right Eye Distance:   Left Eye Distance:   Bilateral Distance:    Right Eye Near:   Left Eye Near:    Bilateral Near:     Physical Exam  Constitutional: She is oriented to person, place, and time. She appears well-developed and well-nourished. No distress.  HENT:  Head: Normocephalic and atraumatic.  Eyes: EOM are normal. Pupils are equal, round, and reactive to light.  Neck: Normal range of motion. Neck supple.  Musculoskeletal:  Tenderness to the left deltoid muscle as well as anterior and posterior shoulder musculature. Abduction limited to approximately 30. Superficial joint line tenderness which is likely muscle.  Lymphadenopathy:    She has no cervical adenopathy.  Neurological: She is alert and oriented to person, place, and time. No cranial nerve deficit.  Skin: Skin is warm and dry.  Nursing note and vitals reviewed.    UC Treatments / Results  Labs (all labs ordered are listed, but only abnormal results are displayed) Labs Reviewed - No data to display  EKG  EKG Interpretation None       Radiology No results found.  Procedures Procedures (including critical care time)  Medications Ordered in UC Medications  triamcinolone acetonide (KENALOG-40) injection 40 mg (40 mg Intramuscular Given 03/10/17 1214)   Injection was administered in the left deltoid muscle ordered by provider to nurse.  Initial Impression / Assessment and Plan / UC Course  I have reviewed the triage vital signs and the nursing notes.  Pertinent labs & imaging results that were available during my care of the patient were reviewed by me and considered in my medical decision making (see chart for details).    Try ice for the first  couple days after that use heat. May take the pain medicine as directed. Apply the diclofenac gel over the shoulder and upper arm for pain control. Follow-up with your doctor in the office    Final Clinical Impressions(s) / UC Diagnoses   Final diagnoses:  Tendinitis of left shoulder  Myofascial pain on left side    ED Discharge Orders        Ordered    traMADol (ULTRAM) 50 MG tablet     03/10/17 1213    diclofenac sodium (VOLTAREN) 1 % GEL  4 times daily     03/10/17 1213       Controlled Substance Prescriptions [place] Controlled Substance Registry consulted? Not Applicable   Janne Napoleon, NP 03/10/17 2127

## 2017-03-10 NOTE — ED Notes (Addendum)
Patient has not taken any of her meds this am.

## 2017-04-14 ENCOUNTER — Other Ambulatory Visit: Payer: Self-pay | Admitting: Internal Medicine

## 2017-04-15 ENCOUNTER — Other Ambulatory Visit: Payer: Self-pay | Admitting: Internal Medicine

## 2017-04-15 DIAGNOSIS — F17209 Nicotine dependence, unspecified, with unspecified nicotine-induced disorders: Secondary | ICD-10-CM

## 2017-04-17 ENCOUNTER — Ambulatory Visit
Admission: RE | Admit: 2017-04-17 | Discharge: 2017-04-17 | Disposition: A | Discharge status: Home / Self Care | Payer: Medicare Other | Source: Ambulatory Visit | Attending: Internal Medicine | Admitting: Internal Medicine

## 2017-04-17 DIAGNOSIS — F17209 Nicotine dependence, unspecified, with unspecified nicotine-induced disorders: Secondary | ICD-10-CM

## 2017-06-06 DIAGNOSIS — J189 Pneumonia, unspecified organism: Secondary | ICD-10-CM

## 2017-06-06 HISTORY — DX: Pneumonia, unspecified organism: J18.9

## 2017-06-30 ENCOUNTER — Inpatient Hospital Stay (HOSPITAL_COMMUNITY)
Admission: EM | Admit: 2017-06-30 | Discharge: 2017-07-08 | DRG: 167 | Disposition: A | Discharge status: Home / Self Care | Payer: Medicare Other | Attending: Family Medicine | Admitting: Family Medicine

## 2017-06-30 ENCOUNTER — Emergency Department (HOSPITAL_COMMUNITY): Payer: Medicare Other

## 2017-06-30 ENCOUNTER — Encounter (HOSPITAL_COMMUNITY): Payer: Self-pay

## 2017-06-30 ENCOUNTER — Other Ambulatory Visit: Payer: Self-pay

## 2017-06-30 DIAGNOSIS — Z9842 Cataract extraction status, left eye: Secondary | ICD-10-CM

## 2017-06-30 DIAGNOSIS — Z85828 Personal history of other malignant neoplasm of skin: Secondary | ICD-10-CM

## 2017-06-30 DIAGNOSIS — R918 Other nonspecific abnormal finding of lung field: Secondary | ICD-10-CM | POA: Diagnosis present

## 2017-06-30 DIAGNOSIS — Z9841 Cataract extraction status, right eye: Secondary | ICD-10-CM

## 2017-06-30 DIAGNOSIS — Z791 Long term (current) use of non-steroidal anti-inflammatories (NSAID): Secondary | ICD-10-CM

## 2017-06-30 DIAGNOSIS — E860 Dehydration: Secondary | ICD-10-CM | POA: Diagnosis not present

## 2017-06-30 DIAGNOSIS — Z7982 Long term (current) use of aspirin: Secondary | ICD-10-CM

## 2017-06-30 DIAGNOSIS — C3480 Malignant neoplasm of overlapping sites of unspecified bronchus and lung: Secondary | ICD-10-CM

## 2017-06-30 DIAGNOSIS — I10 Essential (primary) hypertension: Secondary | ICD-10-CM | POA: Diagnosis present

## 2017-06-30 DIAGNOSIS — B373 Candidiasis of vulva and vagina: Secondary | ICD-10-CM | POA: Diagnosis not present

## 2017-06-30 DIAGNOSIS — J189 Pneumonia, unspecified organism: Principal | ICD-10-CM | POA: Diagnosis present

## 2017-06-30 DIAGNOSIS — Z681 Body mass index (BMI) 19 or less, adult: Secondary | ICD-10-CM

## 2017-06-30 DIAGNOSIS — Z885 Allergy status to narcotic agent status: Secondary | ICD-10-CM

## 2017-06-30 DIAGNOSIS — I639 Cerebral infarction, unspecified: Secondary | ICD-10-CM | POA: Diagnosis present

## 2017-06-30 DIAGNOSIS — Z86010 Personal history of colonic polyps: Secondary | ICD-10-CM

## 2017-06-30 DIAGNOSIS — R64 Cachexia: Secondary | ICD-10-CM | POA: Diagnosis present

## 2017-06-30 DIAGNOSIS — F17210 Nicotine dependence, cigarettes, uncomplicated: Secondary | ICD-10-CM | POA: Diagnosis present

## 2017-06-30 DIAGNOSIS — E11649 Type 2 diabetes mellitus with hypoglycemia without coma: Secondary | ICD-10-CM | POA: Diagnosis present

## 2017-06-30 DIAGNOSIS — J440 Chronic obstructive pulmonary disease with acute lower respiratory infection: Secondary | ICD-10-CM | POA: Diagnosis present

## 2017-06-30 DIAGNOSIS — Z801 Family history of malignant neoplasm of trachea, bronchus and lung: Secondary | ICD-10-CM

## 2017-06-30 DIAGNOSIS — E785 Hyperlipidemia, unspecified: Secondary | ICD-10-CM | POA: Diagnosis present

## 2017-06-30 DIAGNOSIS — Z7902 Long term (current) use of antithrombotics/antiplatelets: Secondary | ICD-10-CM

## 2017-06-30 DIAGNOSIS — I252 Old myocardial infarction: Secondary | ICD-10-CM

## 2017-06-30 DIAGNOSIS — I700 Atherosclerosis of aorta: Secondary | ICD-10-CM | POA: Diagnosis present

## 2017-06-30 DIAGNOSIS — Z833 Family history of diabetes mellitus: Secondary | ICD-10-CM

## 2017-06-30 DIAGNOSIS — E1151 Type 2 diabetes mellitus with diabetic peripheral angiopathy without gangrene: Secondary | ICD-10-CM | POA: Diagnosis present

## 2017-06-30 DIAGNOSIS — F329 Major depressive disorder, single episode, unspecified: Secondary | ICD-10-CM | POA: Diagnosis present

## 2017-06-30 DIAGNOSIS — Z8673 Personal history of transient ischemic attack (TIA), and cerebral infarction without residual deficits: Secondary | ICD-10-CM

## 2017-06-30 DIAGNOSIS — Z800 Family history of malignant neoplasm of digestive organs: Secondary | ICD-10-CM

## 2017-06-30 DIAGNOSIS — D649 Anemia, unspecified: Secondary | ICD-10-CM | POA: Diagnosis present

## 2017-06-30 DIAGNOSIS — I2510 Atherosclerotic heart disease of native coronary artery without angina pectoris: Secondary | ICD-10-CM | POA: Diagnosis present

## 2017-06-30 DIAGNOSIS — K529 Noninfective gastroenteritis and colitis, unspecified: Secondary | ICD-10-CM | POA: Diagnosis present

## 2017-06-30 DIAGNOSIS — M1990 Unspecified osteoarthritis, unspecified site: Secondary | ICD-10-CM | POA: Diagnosis present

## 2017-06-30 DIAGNOSIS — E440 Moderate protein-calorie malnutrition: Secondary | ICD-10-CM | POA: Diagnosis present

## 2017-06-30 DIAGNOSIS — Z794 Long term (current) use of insulin: Secondary | ICD-10-CM

## 2017-06-30 DIAGNOSIS — K219 Gastro-esophageal reflux disease without esophagitis: Secondary | ICD-10-CM | POA: Diagnosis present

## 2017-06-30 DIAGNOSIS — C3482 Malignant neoplasm of overlapping sites of left bronchus and lung: Secondary | ICD-10-CM | POA: Diagnosis present

## 2017-06-30 DIAGNOSIS — Z955 Presence of coronary angioplasty implant and graft: Secondary | ICD-10-CM

## 2017-06-30 DIAGNOSIS — C3412 Malignant neoplasm of upper lobe, left bronchus or lung: Secondary | ICD-10-CM | POA: Diagnosis present

## 2017-06-30 HISTORY — DX: Pneumonia, unspecified organism: J18.9

## 2017-06-30 LAB — CBC WITH DIFFERENTIAL/PLATELET
Basophils Absolute: 0 10*3/uL (ref 0.0–0.1)
Basophils Relative: 0 %
Eosinophils Absolute: 0.1 10*3/uL (ref 0.0–0.7)
Eosinophils Relative: 1 %
HEMATOCRIT: 34.3 % — AB (ref 36.0–46.0)
Hemoglobin: 11.5 g/dL — ABNORMAL LOW (ref 12.0–15.0)
LYMPHS PCT: 25 %
Lymphs Abs: 2.8 10*3/uL (ref 0.7–4.0)
MCH: 29 pg (ref 26.0–34.0)
MCHC: 33.5 g/dL (ref 30.0–36.0)
MCV: 86.6 fL (ref 78.0–100.0)
MONO ABS: 0.7 10*3/uL (ref 0.1–1.0)
MONOS PCT: 7 %
NEUTROS ABS: 7.7 10*3/uL (ref 1.7–7.7)
Neutrophils Relative %: 67 %
Platelets: 424 10*3/uL — ABNORMAL HIGH (ref 150–400)
RBC: 3.96 MIL/uL (ref 3.87–5.11)
RDW: 13.5 % (ref 11.5–15.5)
WBC: 11.3 10*3/uL — ABNORMAL HIGH (ref 4.0–10.5)

## 2017-06-30 LAB — URINALYSIS, ROUTINE W REFLEX MICROSCOPIC
Bilirubin Urine: NEGATIVE
Glucose, UA: NEGATIVE mg/dL
HGB URINE DIPSTICK: NEGATIVE
Ketones, ur: 5 mg/dL — AB
LEUKOCYTES UA: NEGATIVE
Nitrite: NEGATIVE
PROTEIN: 100 mg/dL — AB
Specific Gravity, Urine: 1.021 (ref 1.005–1.030)
pH: 5 (ref 5.0–8.0)

## 2017-06-30 LAB — BASIC METABOLIC PANEL
ANION GAP: 13 (ref 5–15)
BUN: 31 mg/dL — ABNORMAL HIGH (ref 6–20)
CALCIUM: 9.2 mg/dL (ref 8.9–10.3)
CO2: 23 mmol/L (ref 22–32)
CREATININE: 0.9 mg/dL (ref 0.44–1.00)
Chloride: 97 mmol/L — ABNORMAL LOW (ref 101–111)
GFR calc Af Amer: >60 mL/min (ref >60)
GFR calc non Af Amer: >60 mL/min (ref >60)
GLUCOSE: 281 mg/dL — AB (ref 65–99)
Potassium: 4.3 mmol/L (ref 3.5–5.1)
Sodium: 133 mmol/L — ABNORMAL LOW (ref 135–145)

## 2017-06-30 MED ORDER — IPRATROPIUM-ALBUTEROL 0.5-2.5 (3) MG/3ML IN SOLN
3.0000 mL | Freq: Once | RESPIRATORY_TRACT | Status: AC
  Administered 2017-07-01: 3 mL via RESPIRATORY_TRACT
  Filled 2017-06-30: qty 3

## 2017-06-30 MED ORDER — CEFTRIAXONE SODIUM 1 G IJ SOLR
1.0000 g | Freq: Once | INTRAMUSCULAR | Status: DC
  Filled 2017-06-30: qty 10

## 2017-06-30 MED ORDER — IOPAMIDOL (ISOVUE-300) INJECTION 61%
INTRAVENOUS | Status: AC
  Administered 2017-06-30: 75 mL
  Filled 2017-06-30: qty 75

## 2017-06-30 MED ORDER — SODIUM CHLORIDE 0.9 % IV SOLN
500.0000 mg | Freq: Once | INTRAVENOUS | Status: AC
  Administered 2017-07-01: 500 mg via INTRAVENOUS
  Filled 2017-06-30: qty 500

## 2017-06-30 MED ORDER — SODIUM CHLORIDE 0.9 % IV SOLN
1000.0000 mL | INTRAVENOUS | Status: DC
  Administered 2017-07-01: 1000 mL via INTRAVENOUS

## 2017-06-30 MED ORDER — SODIUM CHLORIDE 0.9 % IV BOLUS (SEPSIS)
1000.0000 mL | Freq: Once | INTRAVENOUS | Status: AC
  Administered 2017-07-01: 1000 mL via INTRAVENOUS

## 2017-06-30 NOTE — ED Provider Notes (Signed)
Patient placed in Quick Look pathway, seen and evaluated   Chief Complaint: cough, shortness of breath  HPI:   Katherine Cole is a 77 y.o. female with hx of HTN, CAD, CVA, DM and arthritis who presents to the ED with cough and congestion that started a few days ago and has gotten worse. Patient went to Urgent Care and was told to come to the ED for further evaluation.   ROS: Resp: cough, congestion  Physical Exam:  BP (!) 129/59   Pulse 85   Temp 99.8 F (37.7 C)   Resp 18   Wt 44.9 kg (99 lb)   SpO2 100%   BMI 18.71 kg/m    Gen: No distress  Neuro: Awake and Alert  Skin: Warm  Resp: bilateral decreased breath sounds and rales   Focused Exam:    Initiation of care has begun. The patient has been counseled on the process, plan, and necessity for staying for the completion/evaluation, and the remainder of the medical screening examination    Ashley Murrain, NP 06/30/17 1528    Charlesetta Shanks, MD 06/30/17 [name]    Charlesetta Shanks, MD 07/03/17 [phone]

## 2017-06-30 NOTE — ED Provider Notes (Signed)
Medical screening examination/treatment/procedure(s) were conducted as a shared visit with non-physician practitioner(s) and myself.  I personally evaluated the patient during the encounter.  None Patient presents with cough.  She reports for approximately 1 week.  Nausea and some vomiting.  She reports for greater than a week she has had vomiting with eating.  Cough is been productive of mucus occasionally blood streaked.  Patient is alert and appropriate.  No respiratory distress at rest.  Heart regular.  Breath sounds diminished at bases occasional expiratory wheeze.  CT shows areas concerning for malignancy.  Results reviewed with the patient and her daughter.  Plan to initiate treatment for pneumonia with findings of diffuse infiltrate on CT as well as malignancy.  Plan for admission for continued hydration and treatment.   Charlesetta Shanks, MD 07/03/17 0700

## 2017-06-30 NOTE — ED Triage Notes (Signed)
Pt presents to the ed with complaints of cough and congestion. She was seen at urgent care today and was told she might have pneumonia so they sent her here.

## 2017-06-30 NOTE — ED Provider Notes (Signed)
[place] EMERGENCY DEPARTMENT Provider Note   CSN: [id number] Arrival date & time: 06/30/17  1430     History   Chief Complaint Chief Complaint  Patient presents with  . Pneumonia    HPI Katherine Cole is a 77 y.o. female.  Patient sent from urgent care with concern for pneumonia. Patient is a one ppd smoker. She has had a productive cough x 1 week with occasional yellowish sputum. She has also had persistent nausea, vomiting, and diarrhea over the last week. No documented fevers. Occasional chills. No abdominal pain.  The history is provided by the patient. No language interpreter was used.  Pneumonia  This is a new problem. The current episode started more than 1 week ago. The problem has been gradually worsening. Associated symptoms include shortness of breath.    Past Medical History:  Diagnosis Date  . Arthritis    "in my fingers"   . CAD (coronary artery disease)    a. MI 1999 with 2 stents at that time followed by stenting 3-4 yrs later. b. s/p DES to LAD guided by pressure wire 03/11/12.  . Colon polyps    adenomatous  . Depression   . Diabetes mellitus, type 2 ([place])   . GERD (gastroesophageal reflux disease)   . Hyperlipemia   . Hypertension   . Osteoarthritis   . Skin cancer 2012   "nose; right middle finger"  . Stroke [facility])     Patient Active Problem List   Diagnosis Date Noted  . Acute CVA (cerebrovascular accident) ([place]) 01/20/2014  . Exertional angina ([place]) 03/12/2012  . CAD (coronary artery disease) 02/10/2012  . Tobacco abuse 02/10/2012  . Hypertension 02/10/2012  . Benign neoplasm of colon 08/01/2007  . Uncontrolled type 2 DM with peripheral circulatory disorder ([place]) 08/01/2007  . Hyperlipidemia 08/01/2007  . GASTROESOPHAGEAL REFLUX DISEASE 08/01/2007  . OSTEOARTHRITIS 08/01/2007    Past Surgical History:  Procedure Laterality Date  . APPENDECTOMY  ~ 1965  . CATARACT EXTRACTION    . CORONARY ANGIOPLASTY WITH STENT  PLACEMENT  1999; 2004; 03/11/2012   "2 + 1 + 1; total of 4" (03/11/2012)  . FEMUR FRACTURE SURGERY  2011   RLE (03/11/2012)  . PERCUTANEOUS CORONARY STENT INTERVENTION (PCI-S) N/A 03/11/2012   Procedure: PERCUTANEOUS CORONARY STENT INTERVENTION (PCI-S);  Surgeon: Sherren Mocha, MD;  Location: [facility] CATH LAB;  Service: Cardiovascular;  Laterality: N/A;  . SKIN CANCER EXCISION  2013   "nose & right middle finger; actinic keratosis" (03/11/2012)  . TONSILLECTOMY AND ADENOIDECTOMY  ~ 1962  . TUBAL LIGATION  1977     OB History   None      Home Medications    Prior to Admission medications   Medication Sig Start Date End Date Taking? Authorizing Provider  acetaminophen (TYLENOL) 325 MG tablet Take 650 mg by mouth every 6 (six) hours as needed. For pain    [provider]  aspirin 81 MG tablet Take 1 tablet (81 mg total) by mouth daily. 01/21/14   Annita Brod, MD  atorvastatin (LIPITOR) 80 MG tablet Take 80 mg by mouth daily.    [provider]  clopidogrel (PLAVIX) 75 MG tablet Take 75 mg by mouth daily.    [provider]  diclofenac sodium (VOLTAREN) 1 % GEL Apply 1 application topically 4 (four) times daily. 03/10/17   Janne Napoleon, NP  escitalopram (LEXAPRO) 10 MG tablet Take 10 mg by mouth daily.  03/02/12   [provider]  esomeprazole (NEXIUM) 40 MG capsule Take 40 mg by mouth daily at 12 noon.    [provider]  HYDROcodone-acetaminophen (NORCO) 5-325 MG tablet Take 1 tablet by mouth every 6 (six) hours as needed for moderate pain. 0/62/37   Delora Fuel, MD  ibuprofen (ADVIL,MOTRIN) 200 MG tablet Take 400 mg by mouth every 6 (six) hours as needed (for pain).    [provider]  insulin glargine (LANTUS) 100 UNIT/ML injection Inject 0.3 mLs (30 Units total) into the skin 2 (two) times daily. 01/21/14   Annita Brod, MD  JANUVIA 100 MG tablet Take 100 mg by mouth daily. 11/03/13   [provider]  meloxicam (MOBIC)  15 MG tablet Take 1 tablet (15 mg total) by mouth daily. 03/12/12   Dunn, Nedra Hai, PA-C  metFORMIN (GLUCOPHAGE) 1000 MG tablet Take 1,000 mg by mouth 2 (two) times daily with a meal.    [provider]  metoprolol succinate (TOPROL-XL) 50 MG 24 hr tablet Take 50 mg by mouth daily. Take with or immediately following a meal.    [provider]  nicotine (NICODERM CQ - DOSED IN MG/24 HOURS) 21 mg/24hr patch Place 1 patch (21 mg total) onto the skin daily. 01/21/14   Annita Brod, MD  nitroGLYCERIN (NITROSTAT) 0.4 MG SL tablet Place 1 tablet (0.4 mg total) under the tongue every 5 (five) minutes as needed (up to 3 doses). For chest pain 03/12/12   Dunn, Lisbeth Renshaw N, PA-C  ondansetron (ZOFRAN) 4 MG tablet Take 1 tablet (4 mg total) by mouth every 8 (eight) hours as needed for nausea or vomiting. 09/10/14   Melony Overly, MD  traMADol Veatrice Bourbon) 50 MG tablet Take 1/2 tablet every 6 hours prn pain 03/10/17   Janne Napoleon, NP    Family History Family History  Problem Relation Age of Onset  . Colon cancer Brother 82  . Lung cancer Sister   . Lung cancer Brother   . Stomach cancer Brother   . Diabetes Daughter     Social History Social History   Tobacco Use  . Smoking status: Current Every Day Smoker    Packs/day: 1.00    Years: 51.00    Pack years: 51.00    Types: Cigarettes  . Smokeless tobacco: Never Used  Substance Use Topics  . Alcohol use: No  . Drug use: No     Allergies   Codeine and Morphine and related   Review of Systems Review of Systems  Constitutional: Positive for chills and fatigue. Negative for fever.  Respiratory: Positive for cough, shortness of breath and wheezing.   Gastrointestinal: Positive for diarrhea, nausea and vomiting.  All other systems reviewed and are negative.    Physical Exam Updated Vital Signs BP (!) 176/66   Pulse 66   Temp 99.8 F (37.7 C)   Resp 13   Wt 44.9 kg (99 lb)   SpO2 100%   BMI 18.71 kg/m   Physical Exam    Constitutional: She is oriented to person, place, and time. She appears cachectic. No distress.  HENT:  Head: Normocephalic.  Eyes: Conjunctivae are normal.  Neck: Neck supple.  Cardiovascular: Normal rate and regular rhythm.  Pulmonary/Chest: She has decreased breath sounds. She has wheezes. She exhibits no tenderness.  Abdominal: Soft.  Musculoskeletal: Normal range of motion. She exhibits no edema.  Neurological: She is alert and oriented to person, place, and time.  Skin: Skin is warm and dry.  Psychiatric: She has a  normal mood and affect.  Nursing note and vitals reviewed.    ED Treatments / Results  Labs (all labs ordered are listed, but only abnormal results are displayed) Labs Reviewed  CBC WITH DIFFERENTIAL/PLATELET - Abnormal; Notable for the following components:      Result Value   WBC 11.3 (*)    Hemoglobin 11.5 (*)    HCT 34.3 (*)    Platelets 424 (*)    All other components within normal limits  BASIC METABOLIC PANEL - Abnormal; Notable for the following components:   Sodium 133 (*)    Chloride 97 (*)    Glucose, Bld 281 (*)    BUN 31 (*)    All other components within normal limits  URINALYSIS, ROUTINE W REFLEX MICROSCOPIC - Abnormal; Notable for the following components:   Ketones, ur 5 (*)    Protein, ur 100 (*)    Bacteria, UA RARE (*)    Squamous Epithelial / LPF 0-5 (*)    All other components within normal limits    EKG None  Radiology Dg Chest 2 View  Result Date: 06/30/2017 CLINICAL DATA:  Cough weakness vomiting and diarrhea EXAM: CHEST - 2 VIEW COMPARISON:  09/10/2014 FINDINGS: Hyperinflation. Ill-defined left upper lobe peripheral and parenchymal disease. Ill-defined opacity in the right upper lobe, possibly with cavitation. Normal heart size with aortic atherosclerosis. No pneumothorax. Degenerative changes of the spine. Asymmetric fullness of the left hilum. IMPRESSION: Hyperinflation with ill-defined upper lobe pulmonary opacity with  mild pleural changes in the left upper lobe and possible cavitation of the lesion in the right upper lobe. Asymmetric fullness of the left hilum which may reflect nodes. CT chest recommended for further evaluation. Electronically Signed   By: Donavan Foil M.D.   On: 06/30/2017 18:20   Ct Chest W Contrast  Result Date: 06/30/2017 CLINICAL DATA:  77 year old female with cough and congestion. EXAM: CT CHEST WITH CONTRAST TECHNIQUE: Multidetector CT imaging of the chest was performed during intravenous contrast administration. CONTRAST:  72 milliliters ISOVUE-300 IOPAMIDOL (ISOVUE-300) INJECTION 61% COMPARISON:  Chest radiographs 1813 hours today and earlier. Chest CTA 02/09/2003 FINDINGS: Cardiovascular: Extensive calcified coronary artery atherosclerosis and/or stents. Less severe Calcified aortic atherosclerosis. No cardiomegaly. No pericardial effusion. The other major mediastinal vascular structures appear patent, although there is extrinsic mass effect on the left pulmonary artery branches, see below. Mediastinum/Nodes: Left hilar mass versus conglomerate metastatic nodal disease measuring up to 17 mm in thickness and encasing the left upper lobe airway (series 3, image 66). Bulky AP window lymphadenopathy measuring 18 mm short axis. Other mediastinal nodal stations and the right hilar lymph nodes appear more normal. Lungs/Pleura: Pulmonary hyperinflation. Lobulated and spiculated up to 3.7 centimeter diameter masslike opacity along the left peripheral lung at the level of the hilum (series 3, image 50 and sagittal image 118) with no air bronchograms. Surrounding spiculation and septal thickening. Abnormal peribronchial soft tissue like nodularity tracking to the hilum as seen on series 5, image 60. These are in the left upper lobe. Superimposed mild peribronchial ground-glass nodularity in the left lower lobe. No left pleural effusion. In the right lung there is lateral upper lobe, lateral segment right  middle lobe and right lower lobe peribronchial ground-glass nodularity. No right pleural effusion. Upper Abdomen: Negative visible liver, spleen, left adrenal gland, right adrenal gland, right kidney, and pancreas in the upper abdomen. Left kidney remarkable for calcified vascular disease or nephrolithiasis. There is air in fluid distending the stomach although there is superimposed  gastric mucosal hyperenhancement (series 3, image 127 which is not particularly masslike. Musculoskeletal: Osteopenia. No rib destruction associated with the left lateral lung mass which borders the left lateral 4th and 5th ribs. No acute or suspicious osseous lesion identified in the chest. IMPRESSION: 1. 3.7 cm spiculated mass in the lateral periphery of the left upper lobe at the level of the hilum is Bronchogenic Carcinoma until proven otherwise. Associated with abnormal peribronchial soft tissue nodularity tracking to the left hilum and bulky, malignant appearing left hilar and AP window lymphadenopathy. 2. Underlying pulmonary hyperinflation and superimposed multilobar bilateral peribronchial ground-glass nodularity compatible with a Superimposed Acute Viral/atypical Respiratory Infection. No pleural effusion. 3. Nonspecific gastric fundal mucosal hyperenhancement, perhaps acute gastritis. 4. Coronary artery and Aortic Atherosclerosis (ICD10-I70.0). Electronically Signed   By: Genevie Ann M.D.   On: 06/30/2017 22:38    Procedures Procedures (including critical care time)  Medications Ordered in ED Medications - No data to display   Initial Impression / Assessment and Plan / ED Course  I have reviewed the triage vital signs and the nursing notes.  Pertinent labs & imaging results that were available during my care of the patient were reviewed by me and considered in my medical decision making (see chart for details).     Patient discussed with and seen by Dr. Johnney Killian. Patient with nausea,vomiting, and diarrhea with  poor po intake. Mild dehydration. Multiple areas of ground glass appearance in lung. Concern for possible superimposed pneumonia in relation to underlying, newly diagnosed lung cancer. Patient started on rocephin and zithromycin. Dr. Johnney Killian discussed CT results with patient. Will request admission for rehydration and continued treatment of pneumonia.   Final Clinical Impressions(s) / ED Diagnoses   Final diagnoses:  Atypical pneumonia  Malignant neoplasm of overlapping sites of lung, unspecified laterality [facility])  Dehydration    ED Discharge Orders    None       Anja, [place]uz[place], NP 07/01/17 1740    Charlesetta Shanks, MD 07/03/17 0700

## 2017-06-30 NOTE — ED Notes (Signed)
Pt endorses productive cough x 1 week with yellow sputum at times. Sent here for possible PNA by her MD. Denies CP or Shob.

## 2017-07-01 ENCOUNTER — Encounter (HOSPITAL_COMMUNITY): Payer: Self-pay | Admitting: General Practice

## 2017-07-01 DIAGNOSIS — J189 Pneumonia, unspecified organism: Secondary | ICD-10-CM | POA: Diagnosis present

## 2017-07-01 DIAGNOSIS — K219 Gastro-esophageal reflux disease without esophagitis: Secondary | ICD-10-CM | POA: Diagnosis present

## 2017-07-01 DIAGNOSIS — R64 Cachexia: Secondary | ICD-10-CM | POA: Diagnosis present

## 2017-07-01 DIAGNOSIS — E860 Dehydration: Secondary | ICD-10-CM | POA: Diagnosis present

## 2017-07-01 DIAGNOSIS — J440 Chronic obstructive pulmonary disease with acute lower respiratory infection: Secondary | ICD-10-CM | POA: Diagnosis present

## 2017-07-01 DIAGNOSIS — R918 Other nonspecific abnormal finding of lung field: Secondary | ICD-10-CM | POA: Diagnosis not present

## 2017-07-01 DIAGNOSIS — C3482 Malignant neoplasm of overlapping sites of left bronchus and lung: Secondary | ICD-10-CM | POA: Diagnosis present

## 2017-07-01 DIAGNOSIS — Z85828 Personal history of other malignant neoplasm of skin: Secondary | ICD-10-CM | POA: Diagnosis not present

## 2017-07-01 DIAGNOSIS — I252 Old myocardial infarction: Secondary | ICD-10-CM | POA: Diagnosis not present

## 2017-07-01 DIAGNOSIS — Z9842 Cataract extraction status, left eye: Secondary | ICD-10-CM | POA: Diagnosis not present

## 2017-07-01 DIAGNOSIS — Z791 Long term (current) use of non-steroidal anti-inflammatories (NSAID): Secondary | ICD-10-CM | POA: Diagnosis not present

## 2017-07-01 DIAGNOSIS — Z794 Long term (current) use of insulin: Secondary | ICD-10-CM | POA: Diagnosis not present

## 2017-07-01 DIAGNOSIS — Z9841 Cataract extraction status, right eye: Secondary | ICD-10-CM | POA: Diagnosis not present

## 2017-07-01 DIAGNOSIS — I639 Cerebral infarction, unspecified: Secondary | ICD-10-CM | POA: Diagnosis not present

## 2017-07-01 DIAGNOSIS — I2510 Atherosclerotic heart disease of native coronary artery without angina pectoris: Secondary | ICD-10-CM | POA: Diagnosis present

## 2017-07-01 DIAGNOSIS — F17210 Nicotine dependence, cigarettes, uncomplicated: Secondary | ICD-10-CM | POA: Diagnosis present

## 2017-07-01 DIAGNOSIS — E1151 Type 2 diabetes mellitus with diabetic peripheral angiopathy without gangrene: Secondary | ICD-10-CM | POA: Diagnosis present

## 2017-07-01 DIAGNOSIS — Z681 Body mass index (BMI) 19 or less, adult: Secondary | ICD-10-CM | POA: Diagnosis not present

## 2017-07-01 DIAGNOSIS — Z7982 Long term (current) use of aspirin: Secondary | ICD-10-CM | POA: Diagnosis not present

## 2017-07-01 DIAGNOSIS — Z955 Presence of coronary angioplasty implant and graft: Secondary | ICD-10-CM | POA: Diagnosis not present

## 2017-07-01 DIAGNOSIS — I10 Essential (primary) hypertension: Secondary | ICD-10-CM | POA: Diagnosis present

## 2017-07-01 DIAGNOSIS — I159 Secondary hypertension, unspecified: Secondary | ICD-10-CM | POA: Diagnosis not present

## 2017-07-01 DIAGNOSIS — E785 Hyperlipidemia, unspecified: Secondary | ICD-10-CM | POA: Diagnosis present

## 2017-07-01 DIAGNOSIS — Z86010 Personal history of colonic polyps: Secondary | ICD-10-CM | POA: Diagnosis not present

## 2017-07-01 DIAGNOSIS — M1990 Unspecified osteoarthritis, unspecified site: Secondary | ICD-10-CM | POA: Diagnosis present

## 2017-07-01 DIAGNOSIS — Z7902 Long term (current) use of antithrombotics/antiplatelets: Secondary | ICD-10-CM | POA: Diagnosis not present

## 2017-07-01 DIAGNOSIS — Z8673 Personal history of transient ischemic attack (TIA), and cerebral infarction without residual deficits: Secondary | ICD-10-CM | POA: Diagnosis not present

## 2017-07-01 DIAGNOSIS — E440 Moderate protein-calorie malnutrition: Secondary | ICD-10-CM | POA: Diagnosis present

## 2017-07-01 LAB — GLUCOSE, CAPILLARY
GLUCOSE-CAPILLARY: 248 mg/dL — AB (ref 65–99)
GLUCOSE-CAPILLARY: 322 mg/dL — AB (ref 65–99)
GLUCOSE-CAPILLARY: 376 mg/dL — AB (ref 65–99)
GLUCOSE-CAPILLARY: 101 mg/dL — AB (ref 65–99)
Glucose-Capillary: 234 mg/dL — ABNORMAL HIGH (ref 65–99)

## 2017-07-01 LAB — CBC
HCT: 29.9 % — ABNORMAL LOW (ref 36.0–46.0)
HEMATOCRIT: 32.2 % — AB (ref 36.0–46.0)
Hemoglobin: 11.2 g/dL — ABNORMAL LOW (ref 12.0–15.0)
Hemoglobin: 10 g/dL — ABNORMAL LOW (ref 12.0–15.0)
MCH: 29 pg (ref 26.0–34.0)
MCH: 29.7 pg (ref 26.0–34.0)
MCHC: 33.4 g/dL (ref 30.0–36.0)
MCHC: 34.8 g/dL (ref 30.0–36.0)
MCV: 85.4 fL (ref 78.0–100.0)
MCV: 86.7 fL (ref 78.0–100.0)
PLATELETS: 365 10*3/uL (ref 150–400)
PLATELETS: 366 10*3/uL (ref 150–400)
RBC: 3.45 MIL/uL — AB (ref 3.87–5.11)
RBC: 3.77 MIL/uL — ABNORMAL LOW (ref 3.87–5.11)
RDW: 13.4 % (ref 11.5–15.5)
RDW: 13.6 % (ref 11.5–15.5)
WBC: 10.4 10*3/uL (ref 4.0–10.5)
WBC: 9.6 10*3/uL (ref 4.0–10.5)

## 2017-07-01 LAB — CREATININE, SERUM: Creatinine, Ser: 0.89 mg/dL (ref 0.44–1.00)

## 2017-07-01 LAB — BASIC METABOLIC PANEL
ANION GAP: 10 (ref 5–15)
BUN: 18 mg/dL (ref 6–20)
CALCIUM: 8.3 mg/dL — AB (ref 8.9–10.3)
CO2: 23 mmol/L (ref 22–32)
Chloride: 102 mmol/L (ref 101–111)
Creatinine, Ser: 0.8 mg/dL (ref 0.44–1.00)
GFR calc Af Amer: >60 mL/min (ref >60)
GLUCOSE: 227 mg/dL — AB (ref 65–99)
POTASSIUM: 3.8 mmol/L (ref 3.5–5.1)
Sodium: 135 mmol/L (ref 135–145)

## 2017-07-01 LAB — EXPECTORATED SPUTUM ASSESSMENT W REFEX TO RESP CULTURE

## 2017-07-01 LAB — C DIFFICILE QUICK SCREEN W PCR REFLEX
C DIFFICILE (CDIFF) INTERP: NOT DETECTED
C DIFFICLE (CDIFF) ANTIGEN: NEGATIVE
C Diff toxin: NEGATIVE

## 2017-07-01 LAB — CBG MONITORING, ED: GLUCOSE-CAPILLARY: 207 mg/dL — AB (ref 65–99)

## 2017-07-01 LAB — PREALBUMIN: Prealbumin: 15.1 mg/dL — ABNORMAL LOW (ref 18–38)

## 2017-07-01 LAB — PROTIME-INR
INR: 1.01
PROTHROMBIN TIME: 13.2 s (ref 11.4–15.2)

## 2017-07-01 LAB — I-STAT CG4 LACTIC ACID, ED: Lactic Acid, Venous: 0.93 mmol/L (ref 0.5–1.9)

## 2017-07-01 MED ORDER — IPRATROPIUM-ALBUTEROL 0.5-2.5 (3) MG/3ML IN SOLN: 3.0000 mL | Freq: Four times a day (QID) | RESPIRATORY_TRACT | Status: DC

## 2017-07-01 MED ORDER — ACETAMINOPHEN 325 MG PO TABS
650.0000 mg | ORAL_TABLET | Freq: Four times a day (QID) | ORAL | Status: DC | PRN
  Administered 2017-07-01: 650 mg via ORAL
  Filled 2017-07-01: qty 2

## 2017-07-01 MED ORDER — ENSURE ENLIVE PO LIQD
237.0000 mL | Freq: Two times a day (BID) | ORAL | Status: DC
  Administered 2017-07-01 – 2017-07-07 (×5): 237 mL via ORAL

## 2017-07-01 MED ORDER — INSULIN ASPART 100 UNIT/ML ~~LOC~~ SOLN
0.0000 [IU] | Freq: Every day | SUBCUTANEOUS | Status: DC
  Administered 2017-07-01: 2 [IU] via SUBCUTANEOUS

## 2017-07-01 MED ORDER — METHYLPREDNISOLONE SODIUM SUCC 125 MG IJ SOLR
60.0000 mg | Freq: Three times a day (TID) | INTRAMUSCULAR | Status: DC
  Administered 2017-07-01 (×2): 60 mg via INTRAVENOUS
  Filled 2017-07-01 (×2): qty 2

## 2017-07-01 MED ORDER — HYDRALAZINE HCL 20 MG/ML IJ SOLN: 5.0000 mg | Freq: Four times a day (QID) | INTRAMUSCULAR | Status: DC | PRN

## 2017-07-01 MED ORDER — ESCITALOPRAM OXALATE 10 MG PO TABS
10.0000 mg | ORAL_TABLET | Freq: Every day | ORAL | Status: DC
  Administered 2017-07-01 – 2017-07-08 (×8): 10 mg via ORAL
  Filled 2017-07-01 (×8): qty 1

## 2017-07-01 MED ORDER — BENZONATATE 100 MG PO CAPS
100.0000 mg | ORAL_CAPSULE | Freq: Three times a day (TID) | ORAL | Status: DC | PRN
  Administered 2017-07-01: 100 mg via ORAL
  Filled 2017-07-01: qty 1

## 2017-07-01 MED ORDER — NICOTINE 21 MG/24HR TD PT24
21.0000 mg | MEDICATED_PATCH | Freq: Every day | TRANSDERMAL | Status: DC
  Administered 2017-07-01 – 2017-07-08 (×8): 21 mg via TRANSDERMAL
  Filled 2017-07-01 (×8): qty 1

## 2017-07-01 MED ORDER — ACETAMINOPHEN 650 MG RE SUPP: 650.0000 mg | Freq: Four times a day (QID) | RECTAL | Status: DC | PRN

## 2017-07-01 MED ORDER — SODIUM CHLORIDE 0.9 % IV SOLN
100.0000 mg | Freq: Two times a day (BID) | INTRAVENOUS | Status: DC
  Administered 2017-07-01 – 2017-07-03 (×5): 100 mg via INTRAVENOUS
  Filled 2017-07-01 (×6): qty 100

## 2017-07-01 MED ORDER — IPRATROPIUM-ALBUTEROL 0.5-2.5 (3) MG/3ML IN SOLN
3.0000 mL | RESPIRATORY_TRACT | Status: DC | PRN
  Filled 2017-07-01: qty 3

## 2017-07-01 MED ORDER — ATORVASTATIN CALCIUM 80 MG PO TABS
80.0000 mg | ORAL_TABLET | Freq: Every day | ORAL | Status: DC
  Administered 2017-07-01 – 2017-07-08 (×8): 80 mg via ORAL
  Filled 2017-07-01 (×8): qty 1

## 2017-07-01 MED ORDER — SODIUM CHLORIDE 0.9 % IV SOLN
1.0000 g | INTRAVENOUS | Status: AC
  Administered 2017-07-01 – 2017-07-07 (×7): 1 g via INTRAVENOUS
  Filled 2017-07-01 (×7): qty 10

## 2017-07-01 MED ORDER — GUAIFENESIN ER 600 MG PO TB12
600.0000 mg | ORAL_TABLET | Freq: Two times a day (BID) | ORAL | Status: DC
  Administered 2017-07-01 – 2017-07-08 (×14): 600 mg via ORAL
  Filled 2017-07-01 (×14): qty 1

## 2017-07-01 MED ORDER — INSULIN GLARGINE 100 UNIT/ML ~~LOC~~ SOLN
30.0000 [IU] | Freq: Two times a day (BID) | SUBCUTANEOUS | Status: DC
  Administered 2017-07-01 (×2): 30 [IU] via SUBCUTANEOUS
  Filled 2017-07-01 (×3): qty 0.3

## 2017-07-01 MED ORDER — PROMETHAZINE HCL 25 MG PO TABS: 12.5000 mg | ORAL_TABLET | Freq: Four times a day (QID) | ORAL | Status: DC | PRN

## 2017-07-01 MED ORDER — IPRATROPIUM-ALBUTEROL 0.5-2.5 (3) MG/3ML IN SOLN
3.0000 mL | Freq: Three times a day (TID) | RESPIRATORY_TRACT | Status: DC
  Administered 2017-07-01: 3 mL via RESPIRATORY_TRACT
  Filled 2017-07-01: qty 3

## 2017-07-01 MED ORDER — METOPROLOL SUCCINATE ER 50 MG PO TB24
50.0000 mg | ORAL_TABLET | Freq: Every day | ORAL | Status: DC
  Administered 2017-07-01 – 2017-07-08 (×8): 50 mg via ORAL
  Filled 2017-07-01 (×8): qty 1

## 2017-07-01 MED ORDER — IPRATROPIUM-ALBUTEROL 0.5-2.5 (3) MG/3ML IN SOLN
3.0000 mL | Freq: Two times a day (BID) | RESPIRATORY_TRACT | Status: DC
  Administered 2017-07-01 – 2017-07-04 (×5): 3 mL via RESPIRATORY_TRACT
  Filled 2017-07-01 (×6): qty 3

## 2017-07-01 MED ORDER — NITROGLYCERIN 0.4 MG SL SUBL: 0.4000 mg | SUBLINGUAL_TABLET | SUBLINGUAL | Status: DC | PRN

## 2017-07-01 MED ORDER — HEPARIN SODIUM (PORCINE) 5000 UNIT/ML IJ SOLN
5000.0000 [IU] | Freq: Three times a day (TID) | INTRAMUSCULAR | Status: DC
  Administered 2017-07-01 – 2017-07-08 (×21): 5000 [IU] via SUBCUTANEOUS
  Filled 2017-07-01 (×21): qty 1

## 2017-07-01 MED ORDER — POLYETHYLENE GLYCOL 3350 17 G PO PACK: 17.0000 g | PACK | Freq: Every day | ORAL | Status: DC | PRN

## 2017-07-01 MED ORDER — INSULIN ASPART 100 UNIT/ML ~~LOC~~ SOLN
0.0000 [IU] | Freq: Three times a day (TID) | SUBCUTANEOUS | Status: DC
  Administered 2017-07-01: 15 [IU] via SUBCUTANEOUS
  Administered 2017-07-01: 11 [IU] via SUBCUTANEOUS
  Administered 2017-07-01: 5 [IU] via SUBCUTANEOUS

## 2017-07-01 NOTE — H&P (Signed)
PCP:   Prince Solian, MD   Chief Complaint:  Nausea, vomiting  HPI: this is a 77 y/o female who has an extensive h/o tobacco abuse. She presents with c/o nausea, vomiting and diarrhea for over 3 weeks. She also has had over a 20 pound weight lost over the past 6 months. She presents with productive cough, SOB and wheeze. She denies fever but reports chills. In the ER CT scan confirms pneumonia and possible lung cancer. The hospitalist have been asked to admit. History provided by the patient and 2 daughters present at bedside. The patient has a sick contact who was recently admitted with e coli diarrhea.   Review of Systems:  The patient denies anorexia, fever, weight loss,, vision loss, decreased hearing, hoarseness, chest pain, syncope, dyspnea on exertion, peripheral edema, balance deficits, hemoptysis, abdominal pain, nausea, vomiting, diarrhea, melena, hematochezia, severe indigestion/heartburn, hematuria, incontinence, genital sores, muscle weakness, suspicious skin lesions, transient blindness, difficulty walking, depression, unusual weight change, abnormal bleeding, enlarged lymph nodes, angioedema, and breast masses.  Past Medical History: Past Medical History:  Diagnosis Date  . Arthritis    "in my fingers"   . CAD (coronary artery disease)    a. MI 1999 with 2 stents at that time followed by stenting 3-4 yrs later. b. s/p DES to LAD guided by pressure wire 03/11/12.  . Colon polyps    adenomatous  . Depression   . Diabetes mellitus, type 2 ([place])   . GERD (gastroesophageal reflux disease)   . Hyperlipemia   . Hypertension   . Osteoarthritis   . Skin cancer 2012   "nose; right middle finger"  . Stroke [facility])    Past Surgical History:  Procedure Laterality Date  . APPENDECTOMY  ~ 1965  . CATARACT EXTRACTION    . CORONARY ANGIOPLASTY WITH STENT PLACEMENT  1999; 2004; 03/11/2012   "2 + 1 + 1; total of 4" (03/11/2012)  . FEMUR FRACTURE SURGERY  2011   RLE (03/11/2012)  .  PERCUTANEOUS CORONARY STENT INTERVENTION (PCI-S) N/A 03/11/2012   Procedure: PERCUTANEOUS CORONARY STENT INTERVENTION (PCI-S);  Surgeon: Sherren Mocha, MD;  Location: [facility] CATH LAB;  Service: Cardiovascular;  Laterality: N/A;  . SKIN CANCER EXCISION  2013   "nose & right middle finger; actinic keratosis" (03/11/2012)  . TONSILLECTOMY AND ADENOIDECTOMY  ~ 1962  . TUBAL LIGATION  1977    Medications: Prior to Admission medications   Medication Sig Start Date End Date Taking? Authorizing Provider  aspirin 81 MG tablet Take 1 tablet (81 mg total) by mouth daily. 01/21/14  Yes Annita Brod, MD  atorvastatin (LIPITOR) 80 MG tablet Take 80 mg by mouth daily.   Yes [provider]  clopidogrel (PLAVIX) 75 MG tablet Take 75 mg by mouth daily.   Yes [provider]  diclofenac sodium (VOLTAREN) 1 % GEL Apply 1 application topically 4 (four) times daily. 03/10/17  Yes Mabe, Shanon Brow, NP  escitalopram (LEXAPRO) 10 MG tablet Take 10 mg by mouth daily.  03/02/12  Yes [provider]  insulin glargine (LANTUS) 100 UNIT/ML injection Inject 0.3 mLs (30 Units total) into the skin 2 (two) times daily. 01/21/14  Yes Annita Brod, MD  JANUVIA 100 MG tablet Take 100 mg by mouth daily. 11/03/13  Yes [provider]  metFORMIN (GLUCOPHAGE) 1000 MG tablet Take 1,000 mg by mouth 2 (two) times daily with a meal.   Yes [provider]  metoprolol succinate (TOPROL-XL) 50 MG 24 hr tablet Take 50 mg  by mouth daily. Take with or immediately following a meal.   Yes [provider]  nitroGLYCERIN (NITROSTAT) 0.4 MG SL tablet Place 1 tablet (0.4 mg total) under the tongue every 5 (five) minutes as needed (up to 3 doses). For chest pain 03/12/12  Yes Dunn, Dayna N, PA-C  traMADol (ULTRAM) 50 MG tablet Take 1/2 tablet every 6 hours prn pain Patient taking differently: Take 25 mg by mouth every 6 (six) hours as needed for moderate pain.  03/10/17  Yes Mabe, Shanon Brow, NP   HYDROcodone-acetaminophen (NORCO) 5-325 MG tablet Take 1 tablet by mouth every 6 (six) hours as needed for moderate pain. Patient not taking: Reported on 6/44/0347 07/31/93   Delora Fuel, MD  meloxicam (MOBIC) 15 MG tablet Take 1 tablet (15 mg total) by mouth daily. Patient not taking: Reported on 06/30/2017 03/12/12   Charlie Pitter, PA-C  nicotine (NICODERM CQ - DOSED IN MG/24 HOURS) 21 mg/24hr patch Place 1 patch (21 mg total) onto the skin daily. Patient not taking: Reported on 06/30/2017 01/21/14   Annita Brod, MD  ondansetron (ZOFRAN) 4 MG tablet Take 1 tablet (4 mg total) by mouth every 8 (eight) hours as needed for nausea or vomiting. Patient not taking: Reported on 06/30/2017 09/10/14   Melony Overly, MD    Allergies:   Allergies  Allergen Reactions  . Codeine Other (See Comments)    "gives me a migraine" (03/11/2012)  . Morphine And Related Other (See Comments)    "gives me a migraine" (03/11/2012)    Social History:  reports that she has been smoking cigarettes.  She has a 51.00 pack-year smoking history. She has never used smokeless tobacco. She reports that she does not drink alcohol or use drugs.  Family History: Family History  Problem Relation Age of Onset  . Colon cancer Brother 67  . Lung cancer Sister   . Lung cancer Brother   . Stomach cancer Brother   . Diabetes Daughter     Physical Exam: Vitals:   06/30/17 1521 06/30/17 2038 06/30/17 2040 06/30/17 2045  BP:  (!) 147/110  (!) 176/66  Pulse:   74 66  Resp:  (!) 23 19 13   Temp:      SpO2:   99% 100%  Weight: 44.9 kg (99 lb)       General:  Alert and oriented times three, cachectic female, no acute distress, weak Eyes: PERRLA, pink conjunctiva, no scleral icterus ENT: Moist oral mucosa, neck supple, no thyromegaly Lungs: course to auscultation, mild wheeze, no crackles, no use of accessory muscles Cardiovascular: regular rate and rhythm, no regurgitation, no gallops, no murmurs. No carotid bruits, no  JVD Abdomen: soft, positive BS, non-tender, non-distended, no organomegaly, not an acute abdomen GU: not examined Neuro: CN II - XII grossly intact, sensation intact Musculoskeletal: strength 5/5 all extremities, no clubbing, cyanosis or edema Skin: no rash, no subcutaneous crepitation, no decubitus Psych: appropriate patient   Labs on Admission:  Recent Labs    06/30/17 1523  NA 133*  K 4.3  CL 97*  CO2 23  GLUCOSE 281*  BUN 31*  CREATININE 0.90  CALCIUM 9.2   No results for input(s): AST, ALT, ALKPHOS, BILITOT, PROT, ALBUMIN in the last 72 hours. No results for input(s): LIPASE, AMYLASE in the last 72 hours. Recent Labs    06/30/17 1523  WBC 11.3*  NEUTROABS 7.7  HGB 11.5*  HCT 34.3*  MCV 86.6  PLT 424*   No results for  input(s): CKTOTAL, CKMB, CKMBINDEX, TROPONINI in the last 72 hours. Invalid input(s): POCBNP No results for input(s): DDIMER in the last 72 hours. No results for input(s): HGBA1C in the last 72 hours. No results for input(s): CHOL, HDL, LDLCALC, TRIG, CHOLHDL, LDLDIRECT in the last 72 hours. No results for input(s): TSH, T4TOTAL, T3FREE, THYROIDAB in the last 72 hours.  Invalid input(s): FREET3 No results for input(s): VITAMINB12, FOLATE, FERRITIN, TIBC, IRON, RETICCTPCT in the last 72 hours.  Micro Results: No results found for this or any previous visit (from the past 240 hour(s)).   Radiological Exams on Admission: Dg Chest 2 View  Result Date: 06/30/2017 CLINICAL DATA:  Cough weakness vomiting and diarrhea EXAM: CHEST - 2 VIEW COMPARISON:  09/10/2014 FINDINGS: Hyperinflation. Ill-defined left upper lobe peripheral and parenchymal disease. Ill-defined opacity in the right upper lobe, possibly with cavitation. Normal heart size with aortic atherosclerosis. No pneumothorax. Degenerative changes of the spine. Asymmetric fullness of the left hilum. IMPRESSION: Hyperinflation with ill-defined upper lobe pulmonary opacity with mild pleural changes in  the left upper lobe and possible cavitation of the lesion in the right upper lobe. Asymmetric fullness of the left hilum which may reflect nodes. CT chest recommended for further evaluation. Electronically Signed   By: Donavan Foil M.D.   On: 06/30/2017 18:20   Ct Chest W Contrast  Result Date: 06/30/2017 CLINICAL DATA:  77 year old female with cough and congestion. EXAM: CT CHEST WITH CONTRAST TECHNIQUE: Multidetector CT imaging of the chest was performed during intravenous contrast administration. CONTRAST:  72 milliliters ISOVUE-300 IOPAMIDOL (ISOVUE-300) INJECTION 61% COMPARISON:  Chest radiographs 1813 hours today and earlier. Chest CTA 02/09/2003 FINDINGS: Cardiovascular: Extensive calcified coronary artery atherosclerosis and/or stents. Less severe Calcified aortic atherosclerosis. No cardiomegaly. No pericardial effusion. The other major mediastinal vascular structures appear patent, although there is extrinsic mass effect on the left pulmonary artery branches, see below. Mediastinum/Nodes: Left hilar mass versus conglomerate metastatic nodal disease measuring up to 17 mm in thickness and encasing the left upper lobe airway (series 3, image 66). Bulky AP window lymphadenopathy measuring 18 mm short axis. Other mediastinal nodal stations and the right hilar lymph nodes appear more normal. Lungs/Pleura: Pulmonary hyperinflation. Lobulated and spiculated up to 3.7 centimeter diameter masslike opacity along the left peripheral lung at the level of the hilum (series 3, image 50 and sagittal image 118) with no air bronchograms. Surrounding spiculation and septal thickening. Abnormal peribronchial soft tissue like nodularity tracking to the hilum as seen on series 5, image 60. These are in the left upper lobe. Superimposed mild peribronchial ground-glass nodularity in the left lower lobe. No left pleural effusion. In the right lung there is lateral upper lobe, lateral segment right middle lobe and right lower  lobe peribronchial ground-glass nodularity. No right pleural effusion. Upper Abdomen: Negative visible liver, spleen, left adrenal gland, right adrenal gland, right kidney, and pancreas in the upper abdomen. Left kidney remarkable for calcified vascular disease or nephrolithiasis. There is air in fluid distending the stomach although there is superimposed gastric mucosal hyperenhancement (series 3, image 127 which is not particularly masslike. Musculoskeletal: Osteopenia. No rib destruction associated with the left lateral lung mass which borders the left lateral 4th and 5th ribs. No acute or suspicious osseous lesion identified in the chest. IMPRESSION: 1. 3.7 cm spiculated mass in the lateral periphery of the left upper lobe at the level of the hilum is Bronchogenic Carcinoma until proven otherwise. Associated with abnormal peribronchial soft tissue nodularity tracking to the left  hilum and bulky, malignant appearing left hilar and AP window lymphadenopathy. 2. Underlying pulmonary hyperinflation and superimposed multilobar bilateral peribronchial ground-glass nodularity compatible with a Superimposed Acute Viral/atypical Respiratory Infection. No pleural effusion. 3. Nonspecific gastric fundal mucosal hyperenhancement, perhaps acute gastritis. 4. Coronary artery and Aortic Atherosclerosis (ICD10-I70.0). Electronically Signed   By: Genevie Ann M.D.   On: 06/30/2017 22:38    Assessment/Plan Present on Admission: . CAP (community acquired pneumonia) -admit to medsurg -blood and sputum cultures ordered -rocephin and doxy -oxygen, duonebs ordered  . Lung mass -suspicious for lung cancer -patient on ASA and plavix both held -order biopsy when appropriate  Gastroenteritis -GI biofire, C diff ordered  . Tobacco abuse -nicotine patch, duonebs, oxygen  . CAD (coronary artery disease) -stable, home meds resumed  . Hyperlipidemia -stable, home meds resumed  . Hypertension -stable, home meds  resumed  . Uncontrolled type 2 DM with peripheral circulatory disorder ([place]) - home meds resumed -[facility]  Heywood Tokunaga 07/01/2017, 12:10 AM

## 2017-07-01 NOTE — Progress Notes (Signed)
Patient is a 39 female with past medical history of diabetes mellitus, coronary artery disease,Depression,  Hypertension who presented to the emergency department with complaints of nausea, vomiting and diarrhea for 3 weeks.  Patient also reported more than 20 pound weight loss over last 6 months.  She was also experiencing shortness of breath and productive cough. Patient has extensive family history of different cancers. Currently patient is being treated for pneumonia.  CT chest done in the emergency department was suggestive of 3.7 cm spiculated mass in the left upper lobe.  Report:3.7 cm spiculated mass in the lateral periphery of the left upper lobe at the level of the hilum is Bronchogenic Carcinoma until proven otherwise. Associated with abnormal peribronchial soft tissue nodularity tracking to the left hilum and bulky, malignant appearing left hilar and AP window lymphadenopathy I have requested for pulmonary evaluation today.  she needs to be evaluated for lung mass.   Patient was also having diarrhea which had stopped.  GI pathogen panel and C. difficile has been sent. Patient seen and examined the bedside this morning.  She is hemodynamically stable.  Daughter was present on the bedside.  She looks comfortable during my evaluation.  Diarrhea has stopped.  She is having cough.She was actively smoking until she was admitted. She looks pretty malnourished.  Auscultation revealed bilateral rhonchi and decreased air entry bilaterally. We will continue to monitor the patient.

## 2017-07-01 NOTE — Consult Note (Signed)
Name: Katherine Cole MRN: [id number] DOB: 1940/04/11    ADMISSION DATE:  06/30/2017 CONSULTATION DATE:  3/26  REFERRING MD :  Tawanna Solo   CHIEF COMPLAINT:  Lung mass and PNA   BRIEF PATIENT DESCRIPTION:  This 77 year old female with a known history of diabetes, coronary artery disease and hypertension.  She presented to the emergency room on 3/25 with chief complaint of nausea and vomiting as well as diarrhea for approximately 3 weeks, also complained of productive cough, shortness of breath and wheezing.  In the emergency room his prior diagnostic evaluation she underwent a CT of chest.  This demonstrated a 3.7 cm left  hilar mass, as well as multilobar bilateral peribronchial groundglass nodular changes.  As well as left peripheral  density also noted.  Pulmonary asked to evaluate given the findings of the CT chest.    SIGNIFICANT EVENTS    STUDIES:   CT chest 3/25: 1. 3.7 cm spiculated mass in the lateral periphery of the left upper lobe at the level of the hilum is Bronchogenic Carcinoma until proven otherwise. Associated with abnormal peribronchial soft tissue nodularity tracking to the left hilum and bulky, malignant appearing left hilar and AP window lymphadenopathy. 2. Underlying pulmonary hyperinflation and superimposed multilobar bilateral peribronchial ground-glass nodularity compatible with a Superimposed Acute Viral/atypical Respiratory Infection. No pleural effusion. 3. Nonspecific gastric fundal mucosal hyperenhancement, perhaps acute gastritis. 4. Coronary artery and Aortic Atherosclerosis (ICD10-I70.0).   HISTORY OF PRESENT ILLNESS:   See above  PAST MEDICAL HISTORY :   has a past medical history of Arthritis, CAD (coronary artery disease), Colon polyps, Depression, Diabetes mellitus, type 2 ([place]), GERD (gastroesophageal reflux disease), Hyperlipemia, Hypertension, Osteoarthritis, Pneumonia (06/2017), Skin cancer (2012), and Stroke [facility]).  has a past surgical  history that includes Coronary angioplasty with stent (1999; 2004; 03/11/2012); Appendectomy (~ 1965); Tonsillectomy and adenoidectomy (~ 1962); Femur fracture surgery (2011); Tubal ligation (1977); Skin cancer excision (2013); percutaneous coronary stent intervention (pci-s) (N/A, 03/11/2012); and Cataract extraction. Prior to Admission medications   Medication Sig Start Date End Date Taking? Authorizing Provider  aspirin 81 MG tablet Take 1 tablet (81 mg total) by mouth daily. 01/21/14  Yes Annita Brod, MD  atorvastatin (LIPITOR) 80 MG tablet Take 80 mg by mouth daily.   Yes [provider]  clopidogrel (PLAVIX) 75 MG tablet Take 75 mg by mouth daily.   Yes [provider]  diclofenac sodium (VOLTAREN) 1 % GEL Apply 1 application topically 4 (four) times daily. 03/10/17  Yes Mabe, Shanon Brow, NP  escitalopram (LEXAPRO) 10 MG tablet Take 10 mg by mouth daily.  03/02/12  Yes [provider]  insulin glargine (LANTUS) 100 UNIT/ML injection Inject 0.3 mLs (30 Units total) into the skin 2 (two) times daily. 01/21/14  Yes Annita Brod, MD  JANUVIA 100 MG tablet Take 100 mg by mouth daily. 11/03/13  Yes [provider]  metFORMIN (GLUCOPHAGE) 1000 MG tablet Take 1,000 mg by mouth 2 (two) times daily with a meal.   Yes [provider]  metoprolol succinate (TOPROL-XL) 50 MG 24 hr tablet Take 50 mg by mouth daily. Take with or immediately following a meal.   Yes [provider]  nitroGLYCERIN (NITROSTAT) 0.4 MG SL tablet Place 1 tablet (0.4 mg total) under the tongue every 5 (five) minutes as needed (up to 3 doses). For chest pain 03/12/12  Yes Dunn, Dayna N, PA-C  traMADol (ULTRAM) 50 MG tablet Take 1/2 tablet every 6 hours prn pain Patient  taking differently: Take 25 mg by mouth every 6 (six) hours as needed for moderate pain.  03/10/17  Yes Mabe, Shanon Brow, NP  HYDROcodone-acetaminophen (NORCO) 5-325 MG tablet Take 1 tablet by mouth every 6 (six) hours  as needed for moderate pain. Patient not taking: Reported on 2/70/6237 10/03/29   Delora Fuel, MD  meloxicam (MOBIC) 15 MG tablet Take 1 tablet (15 mg total) by mouth daily. Patient not taking: Reported on 06/30/2017 03/12/12   Charlie Pitter, PA-C  nicotine (NICODERM CQ - DOSED IN MG/24 HOURS) 21 mg/24hr patch Place 1 patch (21 mg total) onto the skin daily. Patient not taking: Reported on 06/30/2017 01/21/14   Annita Brod, MD  ondansetron (ZOFRAN) 4 MG tablet Take 1 tablet (4 mg total) by mouth every 8 (eight) hours as needed for nausea or vomiting. Patient not taking: Reported on 06/30/2017 09/10/14   Melony Overly, MD   Allergies  Allergen Reactions  . Codeine Other (See Comments)    "gives me a migraine" (03/11/2012)  . Morphine And Related Other (See Comments)    "gives me a migraine" (03/11/2012)    FAMILY HISTORY:  family history includes Colon cancer (age of onset: 66) in her brother; Diabetes in her daughter; Lung cancer in her brother and sister; Stomach cancer in her brother. SOCIAL HISTORY:  reports that she has been smoking cigarettes.  She has a 51.00 pack-year smoking history. She has never used smokeless tobacco. She reports that she does not drink alcohol or use drugs.  REVIEW OF SYSTEMS:   Constitutional: Negative for fever, chills, weight loss, malaise/fatigue and diaphoresis.  HENT: Negative for hearing loss, ear pain, nosebleeds, congestion, sore throat, neck pain, tinnitus and ear discharge.   Eyes: Negative for blurred vision, double vision, photophobia, pain, discharge and redness.  Respiratory:+ cough, hemoptysis, sputum production, shortness of breath, wheezing and stridor.   Cardiovascular: Negative for chest pain, palpitations, orthopnea, claudication, leg swelling and PND.  Gastrointestinal: Negative for heartburn, nausea, vomiting, abdominal pain, diarrhea, constipation, blood in stool and melena.  Genitourinary: Negative for dysuria, urgency, frequency,  hematuria and flank pain.  Musculoskeletal: Negative for myalgias, back pain, joint pain and falls.  Skin: Negative for itching and rash.  Neurological: Negative for dizziness, tingling, tremors, sensory change, speech change, focal weakness, seizures, loss of consciousness, weakness and headaches.  Endo/Heme/Allergies: Negative for environmental allergies and polydipsia. Does not bruise/bleed easily.  SUBJECTIVE:  No distress.  VITAL SIGNS: Temp:  [98.5 F (36.9 C)-99.8 F (37.7 C)] 98.9 F (37.2 C) (03/26 1100) Pulse Rate:  [66-85] 66 (03/26 1100) Resp:  [10-28] 20 (03/26 1100) BP: (108-176)/(54-110) 143/54 (03/26 1100) SpO2:  [97 %-100 %] 100 % (03/26 1100) Weight:  [99 lb (44.9 kg)] 99 lb (44.9 kg) (03/25 1521)  PHYSICAL EXAMINATION: General:  Frail 77 year old white female resting in bed no acute distress Neuro:  Awake and alert  HEENT:  NCAT MMM no JVD Cardiovascular:  RRR no MRG Lungs:  Diffuse coarse rhonchi no accessory use  Abdomen:  Soft not tender  Musculoskeletal:  Equal st and bulk  Skin:  Warm and dry   Recent Labs  Lab 06/30/17 1523 07/01/17 0053 07/01/17 0605  NA 133*  --  135  K 4.3  --  3.8  CL 97*  --  102  CO2 23  --  23  BUN 31*  --  18  CREATININE 0.90 0.89 0.80  GLUCOSE 281*  --  227*   Recent Labs  Lab 06/30/17 1523 07/01/17 0053 07/01/17 0605  HGB 11.5* 11.2* 10.0*  HCT 34.3* 32.2* 29.9*  WBC 11.3* 9.6 10.4  PLT 424* 365 366   Dg Chest 2 View  Result Date: 06/30/2017 CLINICAL DATA:  Cough weakness vomiting and diarrhea EXAM: CHEST - 2 VIEW COMPARISON:  09/10/2014 FINDINGS: Hyperinflation. Ill-defined left upper lobe peripheral and parenchymal disease. Ill-defined opacity in the right upper lobe, possibly with cavitation. Normal heart size with aortic atherosclerosis. No pneumothorax. Degenerative changes of the spine. Asymmetric fullness of the left hilum. IMPRESSION: Hyperinflation with ill-defined upper lobe pulmonary opacity with mild  pleural changes in the left upper lobe and possible cavitation of the lesion in the right upper lobe. Asymmetric fullness of the left hilum which may reflect nodes. CT chest recommended for further evaluation. Electronically Signed   By: Donavan Foil M.D.   On: 06/30/2017 18:20   Ct Chest W Contrast  Result Date: 06/30/2017 CLINICAL DATA:  77 year old female with cough and congestion. EXAM: CT CHEST WITH CONTRAST TECHNIQUE: Multidetector CT imaging of the chest was performed during intravenous contrast administration. CONTRAST:  72 milliliters ISOVUE-300 IOPAMIDOL (ISOVUE-300) INJECTION 61% COMPARISON:  Chest radiographs 1813 hours today and earlier. Chest CTA 02/09/2003 FINDINGS: Cardiovascular: Extensive calcified coronary artery atherosclerosis and/or stents. Less severe Calcified aortic atherosclerosis. No cardiomegaly. No pericardial effusion. The other major mediastinal vascular structures appear patent, although there is extrinsic mass effect on the left pulmonary artery branches, see below. Mediastinum/Nodes: Left hilar mass versus conglomerate metastatic nodal disease measuring up to 17 mm in thickness and encasing the left upper lobe airway (series 3, image 66). Bulky AP window lymphadenopathy measuring 18 mm short axis. Other mediastinal nodal stations and the right hilar lymph nodes appear more normal. Lungs/Pleura: Pulmonary hyperinflation. Lobulated and spiculated up to 3.7 centimeter diameter masslike opacity along the left peripheral lung at the level of the hilum (series 3, image 50 and sagittal image 118) with no air bronchograms. Surrounding spiculation and septal thickening. Abnormal peribronchial soft tissue like nodularity tracking to the hilum as seen on series 5, image 60. These are in the left upper lobe. Superimposed mild peribronchial ground-glass nodularity in the left lower lobe. No left pleural effusion. In the right lung there is lateral upper lobe, lateral segment right middle  lobe and right lower lobe peribronchial ground-glass nodularity. No right pleural effusion. Upper Abdomen: Negative visible liver, spleen, left adrenal gland, right adrenal gland, right kidney, and pancreas in the upper abdomen. Left kidney remarkable for calcified vascular disease or nephrolithiasis. There is air in fluid distending the stomach although there is superimposed gastric mucosal hyperenhancement (series 3, image 127 which is not particularly masslike. Musculoskeletal: Osteopenia. No rib destruction associated with the left lateral lung mass which borders the left lateral 4th and 5th ribs. No acute or suspicious osseous lesion identified in the chest. IMPRESSION: 1. 3.7 cm spiculated mass in the lateral periphery of the left upper lobe at the level of the hilum is Bronchogenic Carcinoma until proven otherwise. Associated with abnormal peribronchial soft tissue nodularity tracking to the left hilum and bulky, malignant appearing left hilar and AP window lymphadenopathy. 2. Underlying pulmonary hyperinflation and superimposed multilobar bilateral peribronchial ground-glass nodularity compatible with a Superimposed Acute Viral/atypical Respiratory Infection. No pleural effusion. 3. Nonspecific gastric fundal mucosal hyperenhancement, perhaps acute gastritis. 4. Coronary artery and Aortic Atherosclerosis (ICD10-I70.0). Electronically Signed   By: Genevie Ann M.D.   On: 06/30/2017 22:38    ASSESSMENT / PLAN:  LUL (left hilar and  peripheral) lung mass Scattered ground glass nodular changes Possible post-obst ATX vs PNA Tobacco abuse  Probable COPD Weight loss N&V Anemia    Left hilar lung mass as well as LUL peripheral lesion and peribronchial ground glass changes -can't tell if the more peripheral density is post-obst atx/PNA or another mass  Plan Cont abx Will need tissue biopsy; will discuss modality w/ attending. Bronchoscopy w/ transbronchial biopsies vs CT guided.  Last dose of plavix  was ~ 3 days ago. (need to hold between 5-7d) prior to bx   Erick Colace ACNP-BC [place] Pager # [phone] OR # [phone] if no answer  07/01/2017, 1:47 PM

## 2017-07-02 DIAGNOSIS — E440 Moderate protein-calorie malnutrition: Secondary | ICD-10-CM

## 2017-07-02 LAB — GASTROINTESTINAL PANEL BY PCR, STOOL (REPLACES STOOL CULTURE)
Adenovirus F40/41: NOT DETECTED
Astrovirus: NOT DETECTED
Campylobacter species: NOT DETECTED
Cryptosporidium: NOT DETECTED
Cyclospora cayetanensis: NOT DETECTED
Entamoeba histolytica: NOT DETECTED
Enteroaggregative E coli (EAEC): NOT DETECTED
Enteropathogenic E coli (EPEC): NOT DETECTED
Enterotoxigenic E coli (ETEC): NOT DETECTED
Giardia lamblia: NOT DETECTED
NOROVIRUS GI/GII: NOT DETECTED
Plesimonas shigelloides: NOT DETECTED
ROTAVIRUS A: NOT DETECTED
SALMONELLA SPECIES: NOT DETECTED
SAPOVIRUS (I, II, IV, AND V): NOT DETECTED
SHIGELLA/ENTEROINVASIVE E COLI (EIEC): NOT DETECTED
Shiga like toxin producing E coli (STEC): NOT DETECTED
Vibrio cholerae: NOT DETECTED
Vibrio species: NOT DETECTED
Yersinia enterocolitica: NOT DETECTED

## 2017-07-02 LAB — GLUCOSE, CAPILLARY
GLUCOSE-CAPILLARY: 127 mg/dL — AB (ref 65–99)
GLUCOSE-CAPILLARY: 136 mg/dL — AB (ref 65–99)
GLUCOSE-CAPILLARY: 43 mg/dL — AB (ref 65–99)
GLUCOSE-CAPILLARY: 51 mg/dL — AB (ref 65–99)
GLUCOSE-CAPILLARY: 60 mg/dL — AB (ref 65–99)
Glucose-Capillary: 76 mg/dL (ref 65–99)
Glucose-Capillary: 49 mg/dL — ABNORMAL LOW (ref 65–99)
Glucose-Capillary: 57 mg/dL — ABNORMAL LOW (ref 65–99)
Glucose-Capillary: 95 mg/dL (ref 65–99)
Glucose-Capillary: 230 mg/dL — ABNORMAL HIGH (ref 65–99)

## 2017-07-02 MED ORDER — INSULIN ASPART 100 UNIT/ML ~~LOC~~ SOLN
2.0000 [IU] | Freq: Three times a day (TID) | SUBCUTANEOUS | Status: DC
  Administered 2017-07-02: 2 [IU] via SUBCUTANEOUS

## 2017-07-02 MED ORDER — INSULIN GLARGINE 100 UNIT/ML ~~LOC~~ SOLN
20.0000 [IU] | Freq: Two times a day (BID) | SUBCUTANEOUS | Status: DC
  Administered 2017-07-02: 20 [IU] via SUBCUTANEOUS
  Filled 2017-07-02 (×2): qty 0.2

## 2017-07-02 MED ORDER — INSULIN GLARGINE 100 UNIT/ML ~~LOC~~ SOLN
26.0000 [IU] | Freq: Two times a day (BID) | SUBCUTANEOUS | Status: DC
  Filled 2017-07-02 (×2): qty 0.26

## 2017-07-02 MED ORDER — INSULIN ASPART 100 UNIT/ML ~~LOC~~ SOLN
0.0000 [IU] | Freq: Three times a day (TID) | SUBCUTANEOUS | Status: DC
  Administered 2017-07-03: 5 [IU] via SUBCUTANEOUS
  Administered 2017-07-04: 7 [IU] via SUBCUTANEOUS
  Administered 2017-07-05 – 2017-07-06 (×2): 3 [IU] via SUBCUTANEOUS
  Administered 2017-07-07 (×2): 2 [IU] via SUBCUTANEOUS
  Administered 2017-07-08: 3 [IU] via SUBCUTANEOUS

## 2017-07-02 NOTE — Progress Notes (Signed)
Insulin administration scheduled for 10 am- blood glucose 43.  Insulin held.  Dr Denton Brick notified of the situation.  Patient was given Protein shake and  Breeze fruit juice to drink.  Patient tolerated fluids well- decreased shakiness noted and patient states that she is improving

## 2017-07-02 NOTE — Progress Notes (Addendum)
Inpatient Diabetes Program Recommendations  AACE/ADA: New Consensus Statement on Inpatient Glycemic Control (2015)  Target Ranges:  Prepandial:   less than 140 mg/dL      Peak postprandial:   less than 180 mg/dL (1-2 hours)      Critically ill patients:  140 - 180 mg/dL   Lab Results  Component Value Date   GLUCAP 136 (H) 07/02/2017   HGBA1C 10.6 (H) 01/21/2014   Results for YOLA, PARADISO (MRN [id number]) as of 07/02/2017 13:19  Ref. Range 07/02/2017 04:40 07/02/2017 04:43 07/02/2017 05:22 07/02/2017 10:13 07/02/2017 11:36  Glucose-Capillary Latest Ref Range: 65 - 99 mg/dL 57 (L) 49 (L) 95 43 (LL) 136 (H)   Diabetes history: Type 2 DM Outpatient Diabetes medications: Lantus 30 units bid (per patient she was only taking 15 units of Lantus bid), Januvia 100 mg daily, Metformin 1000 mg bid Current orders for Inpatient glycemic control: Lantus 26 units bid, Novolog 2 units tid with meals Inpatient Diabetes Program Recommendations:   Note hypoglycemia.  Steroids were stopped on 3/26.  Per RN note, patient was only taking Lantus 15 units bid prior to admit. May consider reduction of Lantus to 15 units bid.  Text page sent. Thanks,  Adah Perl, RN, BC-ADM Inpatient Diabetes Coordinator Pager [phone] (8a-5p)

## 2017-07-02 NOTE — Progress Notes (Signed)
Dr Denton Brick paged for update regarding 230 blood glucose and coverage needed.

## 2017-07-02 NOTE — Progress Notes (Signed)
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 She presented with c/o nausea, vomiting and diarrhea for over 3 weeks. Also experienced shortness of breath, productive cough and wheezing.  RD met with pt at bedside. Pt looking at RD however not answering interview questions. Breakfast meal observed on tray table. Meal completion is ~50%. Per notes pt reported > 20 lb weight loss x 6 months. Severe for time frame.  In ER underwent CT of chest. Demonstrated L upper lobe mass.  Likely bronchogenic carcinoma. For biopsy tomorrow. Plan to be NPO after MN. Labs and medications reviewed. CBG's [id number].  NUTRITION - FOCUSED PHYSICAL EXAM:    Most Recent Value  Orbital Region  No depletion  Upper Arm Region  Moderate depletion  Thoracic and Lumbar Region  Unable to assess  Buccal Region  Mild depletion  Temple Region  Mild depletion  Clavicle Bone Region  Moderate depletion  Clavicle and Acromion Bone Region  Moderate depletion  Scapular Bone Region  Unable to assess  Dorsal Hand  Unable to assess  Patellar Region  Moderate depletion  Anterior Thigh Region  Moderate depletion  Posterior Calf Region  Moderate depletion  Edema (RD Assessment)  None     Diet Order:  Diet Carb Modified Fluid consistency: Thin; Room service appropriate?  Yes  EDUCATION NEEDS:   Not appropriate for education at this time  Skin:  Skin Assessment: Reviewed RN Assessment  Last BM:  3/25  Height:   Ht Readings from Last 1 Encounters:  07/02/17 '5\' 1"'$  (1.549 m)    Weight:   Wt Readings from Last 1 Encounters:  07/02/17 99 lb (44.9 kg)    Ideal Body Weight:  47.7 kg  BMI:  Body mass index is 18.71 kg/m.  Estimated Nutritional Needs:   Kcal:  1300-1500  Protein:  65-80 gm  Fluid:  >/= 1.5 L  Arthur Holms, RD, LDN Pager #: [phone] After-Hours Pager #: [phone]

## 2017-07-02 NOTE — Progress Notes (Signed)
Dr Denton Brick notified of the Pharmacy's view regarding new orders for the patient's sliding scale.

## 2017-07-02 NOTE — Progress Notes (Signed)
,                                                                                   Patient Demographics:    Katherine Cole, is a 77 y.o. female, DOB - 1940-10-30, TIR:[id number]  Admit date - 06/30/2017   Admitting Physician Quintella Baton, MD  Outpatient Primary MD for the patient is [name], [name], MD  LOS - 1   Chief Complaint  Patient presents with  . Pneumonia        Subjective:    Katherine Cole today has no fevers, no emesis,  No chest pain, recurrent episodes of hypoglycemia due to poor oral intake  Assessment  & Plan :    Principal Problem:   CAP (community acquired pneumonia) Active Problems:   Uncontrolled type 2 DM with peripheral circulatory disorder (HCC)   Hyperlipidemia   CAD (coronary artery disease)   Tobacco abuse   Hypertension   Acute CVA (cerebrovascular accident) ([place])   Lung mass   Malnutrition of moderate degree  Brief summary 77 year old smoker admitted on 07/01/2017 with shortness of breath, cough and weight loss and found to have central mass with lympyhadenopathy, likely mucus plugging vs bronchial mass and a peripheral based area of consolidation or mass in the left upper lobe, scheduled for endobronchial ultrasound-guided needle aspiration of the hilar mass and fiberoptic bronchoscopy on Friday, 07/04/2017   Plan:- 1)Lung Mass-in the smoker with concerns about possible malignancy, endobronchial ultrasound-guided needle aspiration of the hilar mass and fiberoptic bronchoscopy on 07/04/17, pulmonology consult appreciated, hold aspirin and Plavix to allow for biopsy.  Continue Rocephin and doxycycline for possible superimposed pneumonia   2)DM-episodes of hypoglycemia due to poor oral intake, Allow some permissive Hyperglycemia rather than risk life-threatening hypoglycemia in a patient with unreliable oral intake. Use Novolog/Humalog Sliding scale insulin with Accu-Cheks/Fingersticks as ordered, reduce Lantus insulin to 20 units twice daily from 30  units bid, okay to use very low-dose customized sliding scale insulin   3)CAD-no ACS type symptoms , last stents more than 10 years ago, hold aspirin and Plavix as above #1, continue metoprolol 50 mg daily, continue Lipitor 80 mg daily  Code Status : Full  Disposition Plan  : TBD  Consults  :  PCCM   DVT Prophylaxis  :   Heparin/SCD---   Lab Results  Component Value Date   PLT 366 07/01/2017    Inpatient Medications  Scheduled Meds: . atorvastatin  80 mg Oral Daily  . escitalopram  10 mg Oral Daily  . feeding supplement (ENSURE ENLIVE)  237 mL Oral BID BM  . guaiFENesin  600 mg Oral BID  . heparin  5,000 Units Subcutaneous Q8H  . insulin aspart  2 Units Subcutaneous TID WC  . insulin glargine  20 Units Subcutaneous BID  . ipratropium-albuterol  3 mL Nebulization BID  . metoprolol succinate  50 mg Oral Daily  . nicotine  21 mg Transdermal Daily   Continuous Infusions: . cefTRIAXone (ROCEPHIN)  IV Stopped (07/02/17 0109)  . doxycycline (VIBRAMYCIN) IV 100 mg (07/02/17 1500)   PRN Meds:.acetaminophen **OR** acetaminophen, hydrALAZINE, ipratropium-albuterol, nitroGLYCERIN, polyethylene glycol, promethazine    Anti-infectives (From admission, onward)  Start     Dose/Rate Route Frequency Ordered Stop   07/01/17 0230  doxycycline (VIBRAMYCIN) 100 mg in sodium chloride 0.9 % 250 mL IVPB     100 mg 125 mL/hr over 120 Minutes Intravenous Every 12 hours 07/01/17 0039     07/01/17 0045  cefTRIAXone (ROCEPHIN) 1 g in sodium chloride 0.9 % 100 mL IVPB     1 g 200 mL/hr over 30 Minutes Intravenous Every 24 hours 07/01/17 0039     06/30/17 2330  cefTRIAXone (ROCEPHIN) 1 g in sodium chloride 0.9 % 100 mL IVPB  Status:  Discontinued     1 g 200 mL/hr over 30 Minutes Intravenous  Once 06/30/17 2320 07/01/17 0149   06/30/17 2330  azithromycin (ZITHROMAX) 500 mg in sodium chloride 0.9 % 250 mL IVPB     500 mg 250 mL/hr over 60 Minutes Intravenous  Once 06/30/17 2320 07/01/17 0230         Objective:   Vitals:   07/02/17 0436 07/02/17 0851 07/02/17 1100 07/02/17 1253  BP: 121/85 121/85 (!) 150/53   Pulse: 64 64 73   Resp: 16  16   Temp: 98.5 F (36.9 C)  98.1 F (36.7 C)   TempSrc:   Oral   SpO2: 99%  98%   Weight:    44.9 kg (99 lb)  Height:    5\' 1"  (1.549 m)    Wt Readings from Last 3 Encounters:  07/02/17 44.9 kg (99 lb)  08/11/15 44.9 kg (99 lb)  01/22/14 49.2 kg (108 lb 7.6 oz)     Intake/Output Summary (Last 24 hours) at 07/02/2017 1729 Last data filed at 07/02/2017 0800 Gross per 24 hour  Intake 1820 ml  Output -  Net 1820 ml     Physical Exam  Gen:- Awake Alert,  In no apparent distress  HEENT:- [place].AT, No sclera icterus Neck-Supple Neck,No JVD,.  Lungs-mostly clear, diminished breath sounds, CV- S1, S2 normal Abd-  +ve B.Sounds, Abd Soft, No tenderness,    Extremity/Skin:- No  edema,   good pulses Psych-affect is appropriate, oriented x3 Neuro-no new focal deficits, no tremors   Data Review:   Micro Results Recent Results (from the past 240 hour(s))  Culture, blood (routine x 2)     Status: None (Preliminary result)   Collection Time: 07/01/17 12:53 AM  Result Value Ref Range Status   Specimen Description BLOOD RIGHT ARM  Final   Special Requests   Final    BOTTLES DRAWN AEROBIC AND ANAEROBIC Blood Culture adequate volume   Culture   Final    NO GROWTH 1 DAY Performed at [facility], [place] [address] [place], [place] [address]    Report Status PENDING  Incomplete  Culture, blood (routine x 2)     Status: None (Preliminary result)   Collection Time: 07/01/17  1:20 AM  Result Value Ref Range Status   Specimen Description BLOOD RIGHT FOREARM  Final   Special Requests IN PEDIATRIC BOTTLE Blood Culture adequate volume  Final   Culture   Final    NO GROWTH 1 DAY Performed at [facility], [place] [address], [place], [place] [address]    Report Status PENDING  Incomplete  Culture, expectorated sputum-assessment      Status: None   Collection Time: 07/01/17  2:23 AM  Result Value Ref Range Status   Specimen Description EXPECTORATED SPUTUM  Final   Special Requests NONE  Final   Sputum evaluation   Final  THIS SPECIMEN IS ACCEPTABLE FOR SPUTUM CULTURE Performed at [facility], [place] [address], [place], [place] [address]    Report Status 07/01/2017 FINAL  Final  Culture, respiratory (NON-Expectorated)     Status: None (Preliminary result)   Collection Time: 07/01/17  2:23 AM  Result Value Ref Range Status   Specimen Description EXPECTORATED SPUTUM  Final   Special Requests NONE Reflexed from [id number]  Final   Gram Stain   Final    MODERATE WBC PRESENT, PREDOMINANTLY PMN ABUNDANT GRAM POSITIVE COCCI IN PAIRS MODERATE GRAM NEGATIVE COCCOBACILLI    Culture   Final    CULTURE REINCUBATED FOR BETTER GROWTH Performed at [facility], [place] [address], [place], [place] [address]    Report Status PENDING  Incomplete  Gastrointestinal Panel by PCR , Stool     Status: None   Collection Time: 07/01/17 10:25 AM  Result Value Ref Range Status   Campylobacter species NOT DETECTED NOT DETECTED Final   Plesimonas shigelloides NOT DETECTED NOT DETECTED Final   Salmonella species NOT DETECTED NOT DETECTED Final   Yersinia enterocolitica NOT DETECTED NOT DETECTED Final   Vibrio species NOT DETECTED NOT DETECTED Final   Vibrio cholerae NOT DETECTED NOT DETECTED Final   Enteroaggregative E coli (EAEC) NOT DETECTED NOT DETECTED Final   Enteropathogenic E coli (EPEC) NOT DETECTED NOT DETECTED Final   Enterotoxigenic E coli (ETEC) NOT DETECTED NOT DETECTED Final   Shiga like toxin producing E coli (STEC) NOT DETECTED NOT DETECTED Final   Shigella/Enteroinvasive E coli (EIEC) NOT DETECTED NOT DETECTED Final   Cryptosporidium NOT DETECTED NOT DETECTED Final   Cyclospora cayetanensis NOT DETECTED NOT DETECTED Final   Entamoeba histolytica NOT DETECTED NOT DETECTED Final   Giardia lamblia NOT DETECTED NOT  DETECTED Final   Adenovirus F40/41 NOT DETECTED NOT DETECTED Final   Astrovirus NOT DETECTED NOT DETECTED Final   Norovirus GI/GII NOT DETECTED NOT DETECTED Final   Rotavirus A NOT DETECTED NOT DETECTED Final   Sapovirus (I, II, IV, and V) NOT DETECTED NOT DETECTED Final    Comment: Performed at [facility], [address] [place], [place] [address]  C difficile quick scan w PCR reflex     Status: None   Collection Time: 07/01/17 10:25 AM  Result Value Ref Range Status   C Diff antigen NEGATIVE NEGATIVE Final   C Diff toxin NEGATIVE NEGATIVE Final   C Diff interpretation No C. difficile detected.  Final    Comment: Performed at [facility], [place] [address] [place], [place] [address]    Radiology Reports Dg Chest 2 View  Result Date: 06/30/2017 CLINICAL DATA:  Cough weakness vomiting and diarrhea EXAM: CHEST - 2 VIEW COMPARISON:  09/10/2014 FINDINGS: Hyperinflation. Ill-defined left upper lobe peripheral and parenchymal disease. Ill-defined opacity in the right upper lobe, possibly with cavitation. Normal heart size with aortic atherosclerosis. No pneumothorax. Degenerative changes of the spine. Asymmetric fullness of the left hilum. IMPRESSION: Hyperinflation with ill-defined upper lobe pulmonary opacity with mild pleural changes in the left upper lobe and possible cavitation of the lesion in the right upper lobe. Asymmetric fullness of the left hilum which may reflect nodes. CT chest recommended for further evaluation. Electronically Signed   By: Donavan Foil M.D.   On: 06/30/2017 18:20   Ct Chest W Contrast  Result Date: 06/30/2017 CLINICAL DATA:  77 year old female with cough and congestion. EXAM: CT CHEST WITH CONTRAST TECHNIQUE: Multidetector CT imaging of the chest  was performed during intravenous contrast administration. CONTRAST:  72 milliliters ISOVUE-300 IOPAMIDOL (ISOVUE-300) INJECTION 61% COMPARISON:  Chest radiographs 1813 hours today and earlier. Chest CTA  02/09/2003 FINDINGS: Cardiovascular: Extensive calcified coronary artery atherosclerosis and/or stents. Less severe Calcified aortic atherosclerosis. No cardiomegaly. No pericardial effusion. The other major mediastinal vascular structures appear patent, although there is extrinsic mass effect on the left pulmonary artery branches, see below. Mediastinum/Nodes: Left hilar mass versus conglomerate metastatic nodal disease measuring up to 17 mm in thickness and encasing the left upper lobe airway (series 3, image 66). Bulky AP window lymphadenopathy measuring 18 mm short axis. Other mediastinal nodal stations and the right hilar lymph nodes appear more normal. Lungs/Pleura: Pulmonary hyperinflation. Lobulated and spiculated up to 3.7 centimeter diameter masslike opacity along the left peripheral lung at the level of the hilum (series 3, image 50 and sagittal image 118) with no air bronchograms. Surrounding spiculation and septal thickening. Abnormal peribronchial soft tissue like nodularity tracking to the hilum as seen on series 5, image 60. These are in the left upper lobe. Superimposed mild peribronchial ground-glass nodularity in the left lower lobe. No left pleural effusion. In the right lung there is lateral upper lobe, lateral segment right middle lobe and right lower lobe peribronchial ground-glass nodularity. No right pleural effusion. Upper Abdomen: Negative visible liver, spleen, left adrenal gland, right adrenal gland, right kidney, and pancreas in the upper abdomen. Left kidney remarkable for calcified vascular disease or nephrolithiasis. There is air in fluid distending the stomach although there is superimposed gastric mucosal hyperenhancement (series 3, image 127 which is not particularly masslike. Musculoskeletal: Osteopenia. No rib destruction associated with the left lateral lung mass which borders the left lateral 4th and 5th ribs. No acute or suspicious osseous lesion identified in the chest.  IMPRESSION: 1. 3.7 cm spiculated mass in the lateral periphery of the left upper lobe at the level of the hilum is Bronchogenic Carcinoma until proven otherwise. Associated with abnormal peribronchial soft tissue nodularity tracking to the left hilum and bulky, malignant appearing left hilar and AP window lymphadenopathy. 2. Underlying pulmonary hyperinflation and superimposed multilobar bilateral peribronchial ground-glass nodularity compatible with a Superimposed Acute Viral/atypical Respiratory Infection. No pleural effusion. 3. Nonspecific gastric fundal mucosal hyperenhancement, perhaps acute gastritis. 4. Coronary artery and Aortic Atherosclerosis (ICD10-I70.0). Electronically Signed   By: Genevie Ann M.D.   On: 06/30/2017 22:38     CBC Recent Labs  Lab 06/30/17 1523 07/01/17 0053 07/01/17 0605  WBC 11.3* 9.6 10.4  HGB 11.5* 11.2* 10.0*  HCT 34.3* 32.2* 29.9*  PLT 424* 365 366  MCV 86.6 85.4 86.7  MCH 29.0 29.7 29.0  MCHC 33.5 34.8 33.4  RDW 13.5 13.4 13.6  LYMPHSABS 2.8  --   --   MONOABS 0.7  --   --   EOSABS 0.1  --   --   BASOSABS 0.0  --   --     Chemistries  Recent Labs  Lab 06/30/17 1523 07/01/17 0053 07/01/17 0605  NA 133*  --  135  K 4.3  --  3.8  CL 97*  --  102  CO2 23  --  23  GLUCOSE 281*  --  227*  BUN 31*  --  18  CREATININE 0.90 0.89 0.80  CALCIUM 9.2  --  8.3*   ------------------------------------------------------------------------------------------------------------------ No results for input(s): CHOL, HDL, LDLCALC, TRIG, CHOLHDL, LDLDIRECT in the last 72 hours.  Lab Results  Component Value Date   HGBA1C 10.6 (H) 01/21/2014   ------------------------------------------------------------------------------------------------------------------  No results for input(s): TSH, T4TOTAL, T3FREE, THYROIDAB in the last 72 hours.  Invalid input(s):  FREET3 ------------------------------------------------------------------------------------------------------------------ No results for input(s): VITAMINB12, FOLATE, FERRITIN, TIBC, IRON, RETICCTPCT in the last 72 hours.  Coagulation profile Recent Labs  Lab 07/01/17 0605  INR 1.01    No results for input(s): DDIMER in the last 72 hours.  Cardiac Enzymes No results for input(s): CKMB, TROPONINI, MYOGLOBIN in the last 168 hours.  Invalid input(s): CK ------------------------------------------------------------------------------------------------------------------ No results found for: BNP   Roxan Hockey M.D on 07/02/2017 at 5:29 PM  Between 7am to 7pm - Pager - [phone]  After 7pm go to [email or web address] - password [email or web address]  [facility] -  Office  [phone]   Voice Recognition Viviann Spare dictation system was used to create this note, attempts have been made to correct errors. Please contact the author with questions and/or clarifications.

## 2017-07-02 NOTE — Progress Notes (Signed)
PCCM Interval Note  Pt is scheduled for bronchoscopy with endobronchial ultrasound and biopsies on Friday 3/29 at 2:50.   Plavix on hold. NPO at [place] on 3/29  I will see her tomorrow to discuss procedure in detail.   Baltazar Apo, MD, PhD 07/02/2017, 4:51 PM [facility] [phone] or if no answer [phone]

## 2017-07-02 NOTE — Progress Notes (Addendum)
0045 Per pt "I don't feel right." CBG 51. 240 ml of orange juice is given. 0110 CBG 76 0440 CBG 49. Pt is asymptomatic. 240 ml of orange juice and some gram crackers are given. 0510 CBG 95. RN will continue to monitor.

## 2017-07-03 ENCOUNTER — Telehealth: Payer: Self-pay | Admitting: Emergency Medicine

## 2017-07-03 LAB — BASIC METABOLIC PANEL
Anion gap: 10 (ref 5–15)
BUN: 12 mg/dL (ref 6–20)
CALCIUM: 8.4 mg/dL — AB (ref 8.9–10.3)
CHLORIDE: 104 mmol/L (ref 101–111)
CO2: 26 mmol/L (ref 22–32)
Creatinine, Ser: 0.65 mg/dL (ref 0.44–1.00)
GFR calc non Af Amer: >60 mL/min (ref >60)
Glucose, Bld: 53 mg/dL — ABNORMAL LOW (ref 65–99)
Potassium: 3.3 mmol/L — ABNORMAL LOW (ref 3.5–5.1)
Sodium: 140 mmol/L (ref 135–145)

## 2017-07-03 LAB — GLUCOSE, CAPILLARY
GLUCOSE-CAPILLARY: 58 mg/dL — AB (ref 65–99)
GLUCOSE-CAPILLARY: 137 mg/dL — AB (ref 65–99)
Glucose-Capillary: 49 mg/dL — ABNORMAL LOW (ref 65–99)
Glucose-Capillary: 273 mg/dL — ABNORMAL HIGH (ref 65–99)
Glucose-Capillary: 64 mg/dL — ABNORMAL LOW (ref 65–99)
Glucose-Capillary: 141 mg/dL — ABNORMAL HIGH (ref 65–99)
Glucose-Capillary: 321 mg/dL — ABNORMAL HIGH (ref 65–99)

## 2017-07-03 LAB — CULTURE, RESPIRATORY

## 2017-07-03 LAB — CULTURE, RESPIRATORY W GRAM STAIN: Culture: NORMAL

## 2017-07-03 MED ORDER — HYDRALAZINE HCL 20 MG/ML IJ SOLN: 10.0000 mg | Freq: Four times a day (QID) | INTRAMUSCULAR | Status: DC | PRN

## 2017-07-03 MED ORDER — INSULIN GLARGINE 100 UNIT/ML ~~LOC~~ SOLN: 12.0000 [IU] | Freq: Two times a day (BID) | SUBCUTANEOUS | Status: DC

## 2017-07-03 MED ORDER — INSULIN GLARGINE 100 UNIT/ML ~~LOC~~ SOLN: 10.0000 [IU] | Freq: Two times a day (BID) | SUBCUTANEOUS | Status: DC

## 2017-07-03 MED ORDER — DOXYCYCLINE HYCLATE 100 MG PO TABS
100.0000 mg | ORAL_TABLET | Freq: Two times a day (BID) | ORAL | Status: AC
  Administered 2017-07-03 – 2017-07-06 (×7): 100 mg via ORAL
  Filled 2017-07-03 (×7): qty 1

## 2017-07-03 MED ORDER — POTASSIUM CHLORIDE CRYS ER 20 MEQ PO TBCR: 40.0000 meq | EXTENDED_RELEASE_TABLET | Freq: Once | ORAL | Status: DC

## 2017-07-03 MED ORDER — ALPRAZOLAM 0.5 MG PO TABS
0.5000 mg | ORAL_TABLET | Freq: Every day | ORAL | Status: AC
  Administered 2017-07-03: 0.5 mg via ORAL
  Filled 2017-07-03: qty 1

## 2017-07-03 MED ORDER — POTASSIUM CHLORIDE CRYS ER 20 MEQ PO TBCR
40.0000 meq | EXTENDED_RELEASE_TABLET | ORAL | Status: AC
  Administered 2017-07-03 (×2): 40 meq via ORAL
  Filled 2017-07-03 (×2): qty 2

## 2017-07-03 MED ORDER — INSULIN GLARGINE 100 UNIT/ML ~~LOC~~ SOLN
12.0000 [IU] | Freq: Every day | SUBCUTANEOUS | Status: DC
  Administered 2017-07-04 – 2017-07-05 (×2): 12 [IU] via SUBCUTANEOUS
  Filled 2017-07-03 (×2): qty 0.12

## 2017-07-03 NOTE — Progress Notes (Signed)
,                                                                                   Patient Demographics:    Katherine Cole, is a 77 y.o. female, DOB - 1940/04/09, VQQ:[id number]  Admit date - 06/30/2017   Admitting Physician Quintella Baton, MD  Outpatient Primary MD for the patient is [name], [name], MD  LOS - 2   Chief Complaint  Patient presents with  . Pneumonia        Subjective:    Katherine Cole today has no fevers, no emesis,  No chest pain, appetite remains poor, oral intake remains poor, blood sugars intermittently low,  Assessment  & Plan :    Principal Problem:   CAP (community acquired pneumonia) Active Problems:   Uncontrolled type 2 DM with peripheral circulatory disorder (HCC)   Hyperlipidemia   CAD (coronary artery disease)   Tobacco abuse   Hypertension   Acute CVA (cerebrovascular accident) ([place])   Lung mass   Malnutrition of moderate degree  Brief summary 77 year old smoker admitted on 07/01/2017 with shortness of breath, cough and weight loss and found to have central mass with lympyhadenopathy, likely mucus plugging vs bronchial mass and a peripheral based area of consolidation or mass in the left upper lobe, scheduled for endobronchial ultrasound-guided needle aspiration of the hilar mass and fiberoptic bronchoscopy on Friday, 07/04/2017   Plan:- 1)Lung Mass-in a smoker with concerns about possible malignancy, endobronchial ultrasound-guided needle aspiration of the hilar mass and fiberoptic bronchoscopy on 07/04/17, pulmonology consult appreciated, c/n to hold aspirin and Plavix to allow for biopsy.  Continue Rocephin and doxycycline for possible superimposed pneumonia   2)DM-episodes of hypoglycemia due to poor oral intake,  Allow some permissive Hyperglycemia rather than risk life-threatening hypoglycemia in a patient with unreliable oral intake.  No further Lantus insulin for now, may give 12 units of Lantus insulin on 07/04/2017 p.m. use Novolog/Humalog  Sliding scale insulin with Accu-Cheks/Fingersticks as ordered,    3)CAD-no ACS type symptoms , last stents more than 10 years ago, continue to hold aspirin and Plavix as above #1, continue metoprolol 50 mg daily, continue Lipitor 80 mg daily  Code Status : Full  Disposition Plan  : TBD  Family communication-discussed with patient and daughter [name]  Consults  :  PCCM  DVT Prophylaxis  :   Heparin/SCD---   Lab Results  Component Value Date   PLT 366 07/01/2017   Inpatient Medications  Scheduled Meds: . ALPRAZolam  0.5 mg Oral QHS  . atorvastatin  80 mg Oral Daily  . doxycycline  100 mg Oral Q12H  . escitalopram  10 mg Oral Daily  . feeding supplement (ENSURE ENLIVE)  237 mL Oral BID BM  . guaiFENesin  600 mg Oral BID  . heparin  5,000 Units Subcutaneous Q8H  . insulin aspart  0-9 Units Subcutaneous TID WC  . [START ON 07/04/2017] insulin glargine  12 Units Subcutaneous QHS  . ipratropium-albuterol  3 mL Nebulization BID  . metoprolol succinate  50 mg Oral Daily  . nicotine  21 mg Transdermal Daily   Continuous Infusions: . cefTRIAXone (ROCEPHIN)  IV Stopped (07/02/17 2347)  PRN Meds:.acetaminophen **OR** acetaminophen, hydrALAZINE, ipratropium-albuterol, nitroGLYCERIN, polyethylene glycol, promethazine   Anti-infectives (From admission, onward)   Start     Dose/Rate Route Frequency Ordered Stop   07/03/17 1200  doxycycline (VIBRA-TABS) tablet 100 mg     100 mg Oral Every 12 hours 07/03/17 1103     07/01/17 0230  doxycycline (VIBRAMYCIN) 100 mg in sodium chloride 0.9 % 250 mL IVPB  Status:  Discontinued     100 mg 125 mL/hr over 120 Minutes Intravenous Every 12 hours 07/01/17 0039 07/03/17 1103   07/01/17 0045  cefTRIAXone (ROCEPHIN) 1 g in sodium chloride 0.9 % 100 mL IVPB     1 g 200 mL/hr over 30 Minutes Intravenous Every 24 hours 07/01/17 0039     06/30/17 2330  cefTRIAXone (ROCEPHIN) 1 g in sodium chloride 0.9 % 100 mL IVPB  Status:  Discontinued     1 g 200  mL/hr over 30 Minutes Intravenous  Once 06/30/17 2320 07/01/17 0149   06/30/17 2330  azithromycin (ZITHROMAX) 500 mg in sodium chloride 0.9 % 250 mL IVPB     500 mg 250 mL/hr over 60 Minutes Intravenous  Once 06/30/17 2320 07/01/17 0230       Objective:   Vitals:   07/02/17 1253 07/02/17 2126 07/03/17 0639 07/03/17 1300  BP:  (!) 198/67 (!) 155/65 (!) 164/65  Pulse:  64 68 66  Resp:  16 14 16   Temp:  98.6 F (37 C) 98.8 F (37.1 C) 98.5 F (36.9 C)  TempSrc:  Oral Oral Oral  SpO2:  99% 99% 100%  Weight: 44.9 kg (99 lb)     Height: 5\' 1"  (1.549 m)       Wt Readings from Last 3 Encounters:  07/02/17 44.9 kg (99 lb)  08/11/15 44.9 kg (99 lb)  01/22/14 49.2 kg (108 lb 7.6 oz)    Intake/Output Summary (Last 24 hours) at 07/03/2017 1656 Last data filed at 07/03/2017 1300 Gross per 24 hour  Intake 340 ml  Output -  Net 340 ml    Physical Exam  Gen:- Awake Alert,  In no apparent distress  HEENT:- [place].AT, No sclera icterus Neck-Supple Neck,No JVD,.  Lungs-mostly clear, diminished breath sounds, CV- S1, S2 normal Abd-  +ve B.Sounds, Abd Soft, No tenderness,    Extremity/Skin:- No  edema,   good pulses Psych-affect is appropriate, oriented x3 Neuro-no new focal deficits, no tremors   Data Review:   Micro Results Recent Results (from the past 240 hour(s))  Culture, blood (routine x 2)     Status: None (Preliminary result)   Collection Time: 07/01/17 12:53 AM  Result Value Ref Range Status   Specimen Description BLOOD RIGHT ARM  Final   Special Requests   Final    BOTTLES DRAWN AEROBIC AND ANAEROBIC Blood Culture adequate volume   Culture   Final    NO GROWTH 2 DAYS Performed at [facility], [place] [address], [place], [place] [address]    Report Status PENDING  Incomplete  Culture, blood (routine x 2)     Status: None (Preliminary result)   Collection Time: 07/01/17  1:20 AM  Result Value Ref Range Status   Specimen Description BLOOD RIGHT FOREARM  Final    Special Requests IN PEDIATRIC BOTTLE Blood Culture adequate volume  Final   Culture   Final    NO GROWTH 2 DAYS Performed at [facility], [place] [address] [place], [place] [address]    Report Status PENDING  Incomplete  Culture, expectorated sputum-assessment     Status: None   Collection Time: 07/01/17  2:23 AM  Result Value Ref Range Status   Specimen Description EXPECTORATED SPUTUM  Final   Special Requests NONE  Final   Sputum evaluation   Final    THIS SPECIMEN IS ACCEPTABLE FOR SPUTUM CULTURE Performed at [facility], [address] [place], [place] [address]    Report Status 07/01/2017 FINAL  Final  Culture, respiratory (NON-Expectorated)     Status: None   Collection Time: 07/01/17  2:23 AM  Result Value Ref Range Status   Specimen Description EXPECTORATED SPUTUM  Final   Special Requests NONE Reflexed from [id number]  Final   Gram Stain   Final    MODERATE WBC PRESENT, PREDOMINANTLY PMN ABUNDANT GRAM POSITIVE COCCI IN PAIRS MODERATE GRAM NEGATIVE COCCOBACILLI    Culture   Final    Consistent with normal respiratory flora. Performed at [facility], [place] [address] [place], [place] [address]    Report Status 07/03/2017 FINAL  Final  Gastrointestinal Panel by PCR , Stool     Status: None   Collection Time: 07/01/17 10:25 AM  Result Value Ref Range Status   Campylobacter species NOT DETECTED NOT DETECTED Final   Plesimonas shigelloides NOT DETECTED NOT DETECTED Final   Salmonella species NOT DETECTED NOT DETECTED Final   Yersinia enterocolitica NOT DETECTED NOT DETECTED Final   Vibrio species NOT DETECTED NOT DETECTED Final   Vibrio cholerae NOT DETECTED NOT DETECTED Final   Enteroaggregative E coli (EAEC) NOT DETECTED NOT DETECTED Final   Enteropathogenic E coli (EPEC) NOT DETECTED NOT DETECTED Final   Enterotoxigenic E coli (ETEC) NOT DETECTED NOT DETECTED Final   Shiga like toxin producing E coli (STEC) NOT DETECTED NOT DETECTED Final    Shigella/Enteroinvasive E coli (EIEC) NOT DETECTED NOT DETECTED Final   Cryptosporidium NOT DETECTED NOT DETECTED Final   Cyclospora cayetanensis NOT DETECTED NOT DETECTED Final   Entamoeba histolytica NOT DETECTED NOT DETECTED Final   Giardia lamblia NOT DETECTED NOT DETECTED Final   Adenovirus F40/41 NOT DETECTED NOT DETECTED Final   Astrovirus NOT DETECTED NOT DETECTED Final   Norovirus GI/GII NOT DETECTED NOT DETECTED Final   Rotavirus A NOT DETECTED NOT DETECTED Final   Sapovirus (I, II, IV, and V) NOT DETECTED NOT DETECTED Final    Comment: Performed at [facility], [address] [place], [place] [address]  C difficile quick scan w PCR reflex     Status: None   Collection Time: 07/01/17 10:25 AM  Result Value Ref Range Status   C Diff antigen NEGATIVE NEGATIVE Final   C Diff toxin NEGATIVE NEGATIVE Final   C Diff interpretation No C. difficile detected.  Final    Comment: Performed at [facility], [place] [address] [place], [place] [address]    Radiology Reports Dg Chest 2 View  Result Date: 06/30/2017 CLINICAL DATA:  Cough weakness vomiting and diarrhea EXAM: CHEST - 2 VIEW COMPARISON:  09/10/2014 FINDINGS: Hyperinflation. Ill-defined left upper lobe peripheral and parenchymal disease. Ill-defined opacity in the right upper lobe, possibly with cavitation. Normal heart size with aortic atherosclerosis. No pneumothorax. Degenerative changes of the spine. Asymmetric fullness of the left hilum. IMPRESSION: Hyperinflation with ill-defined upper lobe pulmonary opacity with mild pleural changes in the left upper lobe and possible cavitation of the lesion in the right upper lobe. Asymmetric fullness of the left hilum which may reflect nodes. CT chest  recommended for further evaluation. Electronically Signed   By: Donavan Foil M.D.   On: 06/30/2017 18:20   Ct Chest W Contrast  Result Date: 06/30/2017 CLINICAL DATA:  77 year old female with cough and congestion. EXAM: CT  CHEST WITH CONTRAST TECHNIQUE: Multidetector CT imaging of the chest was performed during intravenous contrast administration. CONTRAST:  72 milliliters ISOVUE-300 IOPAMIDOL (ISOVUE-300) INJECTION 61% COMPARISON:  Chest radiographs 1813 hours today and earlier. Chest CTA 02/09/2003 FINDINGS: Cardiovascular: Extensive calcified coronary artery atherosclerosis and/or stents. Less severe Calcified aortic atherosclerosis. No cardiomegaly. No pericardial effusion. The other major mediastinal vascular structures appear patent, although there is extrinsic mass effect on the left pulmonary artery branches, see below. Mediastinum/Nodes: Left hilar mass versus conglomerate metastatic nodal disease measuring up to 17 mm in thickness and encasing the left upper lobe airway (series 3, image 66). Bulky AP window lymphadenopathy measuring 18 mm short axis. Other mediastinal nodal stations and the right hilar lymph nodes appear more normal. Lungs/Pleura: Pulmonary hyperinflation. Lobulated and spiculated up to 3.7 centimeter diameter masslike opacity along the left peripheral lung at the level of the hilum (series 3, image 50 and sagittal image 118) with no air bronchograms. Surrounding spiculation and septal thickening. Abnormal peribronchial soft tissue like nodularity tracking to the hilum as seen on series 5, image 60. These are in the left upper lobe. Superimposed mild peribronchial ground-glass nodularity in the left lower lobe. No left pleural effusion. In the right lung there is lateral upper lobe, lateral segment right middle lobe and right lower lobe peribronchial ground-glass nodularity. No right pleural effusion. Upper Abdomen: Negative visible liver, spleen, left adrenal gland, right adrenal gland, right kidney, and pancreas in the upper abdomen. Left kidney remarkable for calcified vascular disease or nephrolithiasis. There is air in fluid distending the stomach although there is superimposed gastric mucosal  hyperenhancement (series 3, image 127 which is not particularly masslike. Musculoskeletal: Osteopenia. No rib destruction associated with the left lateral lung mass which borders the left lateral 4th and 5th ribs. No acute or suspicious osseous lesion identified in the chest. IMPRESSION: 1. 3.7 cm spiculated mass in the lateral periphery of the left upper lobe at the level of the hilum is Bronchogenic Carcinoma until proven otherwise. Associated with abnormal peribronchial soft tissue nodularity tracking to the left hilum and bulky, malignant appearing left hilar and AP window lymphadenopathy. 2. Underlying pulmonary hyperinflation and superimposed multilobar bilateral peribronchial ground-glass nodularity compatible with a Superimposed Acute Viral/atypical Respiratory Infection. No pleural effusion. 3. Nonspecific gastric fundal mucosal hyperenhancement, perhaps acute gastritis. 4. Coronary artery and Aortic Atherosclerosis (ICD10-I70.0). Electronically Signed   By: Genevie Ann M.D.   On: 06/30/2017 22:38     CBC Recent Labs  Lab 06/30/17 1523 07/01/17 0053 07/01/17 0605  WBC 11.3* 9.6 10.4  HGB 11.5* 11.2* 10.0*  HCT 34.3* 32.2* 29.9*  PLT 424* 365 366  MCV 86.6 85.4 86.7  MCH 29.0 29.7 29.0  MCHC 33.5 34.8 33.4  RDW 13.5 13.4 13.6  LYMPHSABS 2.8  --   --   MONOABS 0.7  --   --   EOSABS 0.1  --   --   BASOSABS 0.0  --   --     Chemistries  Recent Labs  Lab 06/30/17 1523 07/01/17 0053 07/01/17 0605 07/03/17 0508  NA 133*  --  135 140  K 4.3  --  3.8 3.3*  CL 97*  --  102 104  CO2 23  --  23 26  GLUCOSE 281*  --  227* 53*  BUN 31*  --  18 12  CREATININE 0.90 0.89 0.80 0.65  CALCIUM 9.2  --  8.3* 8.4*   ------------------------------------------------------------------------------------------------------------------ No results for input(s): CHOL, HDL, LDLCALC, TRIG, CHOLHDL, LDLDIRECT in the last 72 hours.  Lab Results  Component Value Date   HGBA1C 10.6 (H) 01/21/2014    ------------------------------------------------------------------------------------------------------------------ No results for input(s): TSH, T4TOTAL, T3FREE, THYROIDAB in the last 72 hours.  Invalid input(s): FREET3 ------------------------------------------------------------------------------------------------------------------ No results for input(s): VITAMINB12, FOLATE, FERRITIN, TIBC, IRON, RETICCTPCT in the last 72 hours.  Coagulation profile Recent Labs  Lab 07/01/17 0605  INR 1.01    No results for input(s): DDIMER in the last 72 hours.  Cardiac Enzymes No results for input(s): CKMB, TROPONINI, MYOGLOBIN in the last 168 hours.  Invalid input(s): CK ------------------------------------------------------------------------------------------------------------------ No results found for: BNP   Roxan Hockey M.D on 07/03/2017 at 4:56 PM  Between 7am to 7pm - Pager - [phone]  After 7pm go to [email or web address] - password [email or web address]  [facility] -  Office  [phone]   Voice Recognition Viviann Spare dictation system was used to create this note, attempts have been made to correct errors. Please contact the author with questions and/or clarifications.

## 2017-07-03 NOTE — H&P (View-Only) (Signed)
   Name: Katherine Cole MRN: [id number] DOB: 1940/07/05    ADMISSION DATE:  06/30/2017 CONSULTATION DATE:  3/26  REFERRING MD :  Tawanna Solo   CHIEF COMPLAINT:  Lung mass and PNA   BRIEF PATIENT DESCRIPTION:  This 77 year old female with a known history of diabetes, coronary artery disease and hypertension.  She presented to the emergency room on 3/25 with chief complaint of nausea and vomiting as well as diarrhea for approximately 3 weeks, also complained of productive cough, shortness of breath and wheezing.  In the emergency room his prior diagnostic evaluation she underwent a CT of chest.  This demonstrated a 3.7 cm left  hilar mass, as well as multilobar bilateral peribronchial groundglass nodular changes.  As well as left peripheral  density also noted.  Pulmonary asked to evaluate given the findings of the CT chest.    SIGNIFICANT EVENTS  STUDIES:   CT chest 3/25: 1. 3.7 cm spiculated mass in the lateral periphery of the left upper lobe at the level of the hilum is Bronchogenic Carcinoma until proven otherwise. Associated with abnormal peribronchial soft tissue nodularity tracking to the left hilum and bulky, malignant appearing left hilar and AP window lymphadenopathy. 2. Underlying pulmonary hyperinflation and superimposed multilobar bilateral peribronchial ground-glass nodularity compatible with a Superimposed Acute Viral/atypical Respiratory Infection. No pleural effusion. 3. Nonspecific gastric fundal mucosal hyperenhancement, perhaps acute gastritis. 4. Coronary artery and Aortic Atherosclerosis (ICD10-I70.0).  SUBJECTIVE:  No distress.   VITAL SIGNS: Temp:  [98.5 F (36.9 C)-98.8 F (37.1 C)] 98.5 F (36.9 C) (03/28 1300) Pulse Rate:  [64-68] 66 (03/28 1300) Resp:  [14-16] 16 (03/28 1300) BP: (155-198)/(65-67) 164/65 (03/28 1300) SpO2:  [99 %-100 %] 100 % (03/28 1300)  PHYSICAL EXAMINATION: General:  Frail 77 year old white female resting in bed no acute  distress Neuro:  Awake and alert  HEENT:  NCAT MMM no JVD Cardiovascular:  RRR no MRG Lungs:  Diffuse coarse rhonchi no accessory use  Abdomen:  Soft not tender  Musculoskeletal:  Equal st and bulk  Skin:  Warm and dry   Recent Labs  Lab 06/30/17 1523 07/01/17 0053 07/01/17 0605 07/03/17 0508  NA 133*  --  135 140  K 4.3  --  3.8 3.3*  CL 97*  --  102 104  CO2 23  --  23 26  BUN 31*  --  18 12  CREATININE 0.90 0.89 0.80 0.65  GLUCOSE 281*  --  227* 53*   Recent Labs  Lab 06/30/17 1523 07/01/17 0053 07/01/17 0605  HGB 11.5* 11.2* 10.0*  HCT 34.3* 32.2* 29.9*  WBC 11.3* 9.6 10.4  PLT 424* 365 366   No results found.  ASSESSMENT / PLAN:  LUL (left hilar and peripheral) lung mass Scattered ground glass nodular changes Possible post-obst ATX vs PNA Tobacco abuse  Probable COPD Weight loss N&V Anemia    Left hilar lung mass as well as LUL peripheral lesion and peribronchial ground glass changes.  Probable primary lung cancer.  Plan Planning for video bronchoscopy with endobronchial ultrasound and biopsies of her left hilar mass, scheduled for 250 on 3/29.  We discussed the procedure today and I explained the risks and benefits.  She agrees.  She has been off Plavix for 5 days.  She will be n.p.o. 5/29  Baltazar Apo, MD, PhD 07/03/2017, 2:51 PM  Pulmonary and Critical Care [phone] or if no answer [phone]

## 2017-07-03 NOTE — Progress Notes (Signed)
Inpatient Diabetes Program Recommendations  AACE/ADA: New Consensus Statement on Inpatient Glycemic Control (2015)  Target Ranges:  Prepandial:   less than 140 mg/dL      Peak postprandial:   less than 180 mg/dL (1-2 hours)      Critically ill patients:  140 - 180 mg/dL   Lab Results  Component Value Date   GLUCAP 137 (H) 07/03/2017   HGBA1C 10.6 (H) 01/21/2014    Review of Glycemic Control Results for JESSICAMARIE, AMIRI (MRN [id number]) as of 07/03/2017 11:33  Ref. Range 07/02/2017 16:46 07/02/2017 21:21 07/03/2017 06:41 07/03/2017 07:03 07/03/2017 07:35  Glucose-Capillary Latest Ref Range: 65 - 99 mg/dL 230 (H) 127 (H) 49 (L) 58 (L) 137 (H)  Outpatient Diabetes medications: Lantus 30 units bid (per patient she was only taking 15 units of Lantus bid), Januvia 100 mg daily, Metformin 1000 mg bid Current orders for Inpatient glycemic control: Lantus 20 units bid, Novolog correction starting at 151 mg/dL.  Inpatient Diabetes Program Recommendations:    Note hypoglycemia this AM.  It appears she needs further reduction in basal insulin.  Of note patient received no Lantus yesterday morning and Lantus 20 units last pm.  May consider reducing Lantus to 10 units q HS. Text page sent.  Thanks,  Adah Perl, RN, BC-ADM Inpatient Diabetes Coordinator Pager [phone] (8a-5p)

## 2017-07-03 NOTE — Consult Note (Signed)
   Name: Katherine Cole MRN: [id number] DOB: 05/23/1940    ADMISSION DATE:  06/30/2017 CONSULTATION DATE:  3/26  REFERRING MD :  Tawanna Solo   CHIEF COMPLAINT:  Lung mass and PNA   BRIEF PATIENT DESCRIPTION:  This 77 year old female with a known history of diabetes, coronary artery disease and hypertension.  She presented to the emergency room on 3/25 with chief complaint of nausea and vomiting as well as diarrhea for approximately 3 weeks, also complained of productive cough, shortness of breath and wheezing.  In the emergency room his prior diagnostic evaluation she underwent a CT of chest.  This demonstrated a 3.7 cm left  hilar mass, as well as multilobar bilateral peribronchial groundglass nodular changes.  As well as left peripheral  density also noted.  Pulmonary asked to evaluate given the findings of the CT chest.    SIGNIFICANT EVENTS  STUDIES:   CT chest 3/25: 1. 3.7 cm spiculated mass in the lateral periphery of the left upper lobe at the level of the hilum is Bronchogenic Carcinoma until proven otherwise. Associated with abnormal peribronchial soft tissue nodularity tracking to the left hilum and bulky, malignant appearing left hilar and AP window lymphadenopathy. 2. Underlying pulmonary hyperinflation and superimposed multilobar bilateral peribronchial ground-glass nodularity compatible with a Superimposed Acute Viral/atypical Respiratory Infection. No pleural effusion. 3. Nonspecific gastric fundal mucosal hyperenhancement, perhaps acute gastritis. 4. Coronary artery and Aortic Atherosclerosis (ICD10-I70.0).  SUBJECTIVE:  No distress.   VITAL SIGNS: Temp:  [98.5 F (36.9 C)-98.8 F (37.1 C)] 98.5 F (36.9 C) (03/28 1300) Pulse Rate:  [64-68] 66 (03/28 1300) Resp:  [14-16] 16 (03/28 1300) BP: (155-198)/(65-67) 164/65 (03/28 1300) SpO2:  [99 %-100 %] 100 % (03/28 1300)  PHYSICAL EXAMINATION: General:  Frail 77 year old white female resting in bed no acute  distress Neuro:  Awake and alert  HEENT:  NCAT MMM no JVD Cardiovascular:  RRR no MRG Lungs:  Diffuse coarse rhonchi no accessory use  Abdomen:  Soft not tender  Musculoskeletal:  Equal st and bulk  Skin:  Warm and dry   Recent Labs  Lab 06/30/17 1523 07/01/17 0053 07/01/17 0605 07/03/17 0508  NA 133*  --  135 140  K 4.3  --  3.8 3.3*  CL 97*  --  102 104  CO2 23  --  23 26  BUN 31*  --  18 12  CREATININE 0.90 0.89 0.80 0.65  GLUCOSE 281*  --  227* 53*   Recent Labs  Lab 06/30/17 1523 07/01/17 0053 07/01/17 0605  HGB 11.5* 11.2* 10.0*  HCT 34.3* 32.2* 29.9*  WBC 11.3* 9.6 10.4  PLT 424* 365 366   No results found.  ASSESSMENT / PLAN:  LUL (left hilar and peripheral) lung mass Scattered ground glass nodular changes Possible post-obst ATX vs PNA Tobacco abuse  Probable COPD Weight loss N&V Anemia    Left hilar lung mass as well as LUL peripheral lesion and peribronchial ground glass changes.  Probable primary lung cancer.  Plan Planning for video bronchoscopy with endobronchial ultrasound and biopsies of her left hilar mass, scheduled for 250 on 3/29.  We discussed the procedure today and I explained the risks and benefits.  She agrees.  She has been off Plavix for 5 days.  She will be n.p.o. 5/29  Baltazar Apo, MD, PhD 07/03/2017, 2:51 PM [facility] [phone] or if no answer [phone]

## 2017-07-03 NOTE — Telephone Encounter (Signed)
Called and spoke with patients daughter, she states that she was able to reach the doctor who is seeing the patient in the hospital and got the procedure time. Nothing further needed.

## 2017-07-04 ENCOUNTER — Encounter (HOSPITAL_COMMUNITY)
Admission: EM | Disposition: A | Discharge status: Home / Self Care | Payer: Self-pay | Source: Home / Self Care | Attending: Family Medicine

## 2017-07-04 ENCOUNTER — Encounter (HOSPITAL_COMMUNITY): Payer: Self-pay | Admitting: Certified Registered Nurse Anesthetist

## 2017-07-04 ENCOUNTER — Inpatient Hospital Stay (HOSPITAL_COMMUNITY): Payer: Medicare Other | Admitting: Certified Registered Nurse Anesthetist

## 2017-07-04 HISTORY — PX: VIDEO BRONCHOSCOPY WITH ENDOBRONCHIAL ULTRASOUND: SHX6177

## 2017-07-04 LAB — BASIC METABOLIC PANEL
Anion gap: 9 (ref 5–15)
BUN: 13 mg/dL (ref 6–20)
CALCIUM: 8.3 mg/dL — AB (ref 8.9–10.3)
CHLORIDE: 102 mmol/L (ref 101–111)
CO2: 24 mmol/L (ref 22–32)
CREATININE: 0.66 mg/dL (ref 0.44–1.00)
GFR calc non Af Amer: >60 mL/min (ref >60)
GLUCOSE: 144 mg/dL — AB (ref 65–99)
Potassium: 4.5 mmol/L (ref 3.5–5.1)
Sodium: 135 mmol/L (ref 135–145)

## 2017-07-04 LAB — GLUCOSE, CAPILLARY
Glucose-Capillary: 145 mg/dL — ABNORMAL HIGH (ref 65–99)
Glucose-Capillary: 147 mg/dL — ABNORMAL HIGH (ref 65–99)
Glucose-Capillary: 157 mg/dL — ABNORMAL HIGH (ref 65–99)
Glucose-Capillary: 190 mg/dL — ABNORMAL HIGH (ref 65–99)
Glucose-Capillary: 344 mg/dL — ABNORMAL HIGH (ref 65–99)
Glucose-Capillary: 453 mg/dL — ABNORMAL HIGH (ref 65–99)

## 2017-07-04 LAB — SURGICAL PCR SCREEN
MRSA, PCR: NEGATIVE
Staphylococcus aureus: NEGATIVE

## 2017-07-04 SURGERY — BRONCHOSCOPY, WITH EBUS
Anesthesia: General

## 2017-07-04 MED ORDER — ONDANSETRON HCL 4 MG/2ML IJ SOLN
INTRAMUSCULAR | Status: DC | PRN
  Administered 2017-07-04: 4 mg via INTRAVENOUS

## 2017-07-04 MED ORDER — SUGAMMADEX SODIUM 200 MG/2ML IV SOLN
INTRAVENOUS | Status: DC | PRN
  Administered 2017-07-04: 89.8 mg via INTRAVENOUS

## 2017-07-04 MED ORDER — MEPERIDINE HCL 50 MG/ML IJ SOLN: 6.2500 mg | INTRAMUSCULAR | Status: DC | PRN

## 2017-07-04 MED ORDER — SUGAMMADEX SODIUM 200 MG/2ML IV SOLN
INTRAVENOUS | Status: AC
  Filled 2017-07-04: qty 2

## 2017-07-04 MED ORDER — ONDANSETRON HCL 4 MG/2ML IJ SOLN
INTRAMUSCULAR | Status: AC
  Filled 2017-07-04: qty 2

## 2017-07-04 MED ORDER — ROCURONIUM BROMIDE 10 MG/ML (PF) SYRINGE
PREFILLED_SYRINGE | INTRAVENOUS | Status: AC
  Filled 2017-07-04: qty 5

## 2017-07-04 MED ORDER — EPHEDRINE SULFATE-NACL 50-0.9 MG/10ML-% IV SOSY
PREFILLED_SYRINGE | INTRAVENOUS | Status: DC | PRN
  Administered 2017-07-04 (×2): 5 mg via INTRAVENOUS

## 2017-07-04 MED ORDER — IPRATROPIUM-ALBUTEROL 0.5-2.5 (3) MG/3ML IN SOLN: 3.0000 mL | RESPIRATORY_TRACT | Status: DC | PRN

## 2017-07-04 MED ORDER — DEXAMETHASONE SODIUM PHOSPHATE 10 MG/ML IJ SOLN
INTRAMUSCULAR | Status: AC
  Filled 2017-07-04: qty 1

## 2017-07-04 MED ORDER — LACTATED RINGERS IV SOLN
INTRAVENOUS | Status: DC
  Administered 2017-07-04 – 2017-07-05 (×2): via INTRAVENOUS

## 2017-07-04 MED ORDER — DEXAMETHASONE SODIUM PHOSPHATE 10 MG/ML IJ SOLN
INTRAMUSCULAR | Status: DC | PRN
  Administered 2017-07-04: 10 mg via INTRAVENOUS

## 2017-07-04 MED ORDER — LIDOCAINE HCL (CARDIAC) 20 MG/ML IV SOLN
INTRAVENOUS | Status: DC | PRN
  Administered 2017-07-04: 60 mg via INTRATRACHEAL
  Administered 2017-07-04: 40 mg via INTRAVENOUS

## 2017-07-04 MED ORDER — ALPRAZOLAM 0.5 MG PO TABS
0.5000 mg | ORAL_TABLET | Freq: Every day | ORAL | Status: AC
  Administered 2017-07-04 – 2017-07-06 (×3): 0.5 mg via ORAL
  Filled 2017-07-04 (×3): qty 1

## 2017-07-04 MED ORDER — PHENYLEPHRINE 40 MCG/ML (10ML) SYRINGE FOR IV PUSH (FOR BLOOD PRESSURE SUPPORT)
PREFILLED_SYRINGE | INTRAVENOUS | Status: AC
  Filled 2017-07-04: qty 10

## 2017-07-04 MED ORDER — FENTANYL CITRATE (PF) 100 MCG/2ML IJ SOLN
INTRAMUSCULAR | Status: DC | PRN
  Administered 2017-07-04 (×2): 50 ug via INTRAVENOUS

## 2017-07-04 MED ORDER — PROPOFOL 10 MG/ML IV BOLUS
INTRAVENOUS | Status: DC | PRN
  Administered 2017-07-04: 100 mg via INTRAVENOUS

## 2017-07-04 MED ORDER — EPHEDRINE 5 MG/ML INJ
INTRAVENOUS | Status: AC
  Filled 2017-07-04: qty 10

## 2017-07-04 MED ORDER — 0.9 % SODIUM CHLORIDE (POUR BTL) OPTIME
TOPICAL | Status: DC | PRN
  Administered 2017-07-04: 1000 mL

## 2017-07-04 MED ORDER — METOCLOPRAMIDE HCL 5 MG/ML IJ SOLN: 10.0000 mg | Freq: Once | INTRAMUSCULAR | Status: DC | PRN

## 2017-07-04 MED ORDER — EPINEPHRINE PF 1 MG/ML IJ SOLN
INTRAMUSCULAR | Status: AC
  Filled 2017-07-04: qty 1

## 2017-07-04 MED ORDER — FENTANYL CITRATE (PF) 100 MCG/2ML IJ SOLN: 25.0000 ug | INTRAMUSCULAR | Status: DC | PRN

## 2017-07-04 MED ORDER — ROCURONIUM BROMIDE 100 MG/10ML IV SOLN
INTRAVENOUS | Status: DC | PRN
  Administered 2017-07-04: 40 mg via INTRAVENOUS

## 2017-07-04 MED ORDER — FENTANYL CITRATE (PF) 250 MCG/5ML IJ SOLN
INTRAMUSCULAR | Status: AC
  Filled 2017-07-04: qty 5

## 2017-07-04 SURGICAL SUPPLY — 30 items
BRUSH CYTOL CELLEBRITY 1.5X140 (MISCELLANEOUS) ×1 IMPLANT
CANISTER SUCT 3000ML PPV (MISCELLANEOUS) ×2 IMPLANT
CONT SPEC 4OZ CLIKSEAL STRL BL (MISCELLANEOUS) ×2 IMPLANT
COVER BACK TABLE 60X90IN (DRAPES) ×2 IMPLANT
COVER DOME SNAP 22 D (MISCELLANEOUS) ×2 IMPLANT
FORCEPS BIOP RJ4 1.8 (CUTTING FORCEPS) IMPLANT
GAUZE SPONGE 4X4 12PLY STRL (GAUZE/BANDAGES/DRESSINGS) ×2 IMPLANT
GLOVE BIO SURGEON STRL SZ7.5 (GLOVE) ×2 IMPLANT
GLOVE BIOGEL PI IND STRL 6.5 (GLOVE) IMPLANT
GLOVE BIOGEL PI INDICATOR 6.5 (GLOVE) ×1
GOWN STRL REUS W/ TWL LRG LVL3 (GOWN DISPOSABLE) ×1 IMPLANT
GOWN STRL REUS W/TWL LRG LVL3 (GOWN DISPOSABLE) ×4
KIT CLEAN ENDO COMPLIANCE (KITS) ×4 IMPLANT
KIT TURNOVER KIT B (KITS) ×2 IMPLANT
MARKER SKIN DUAL TIP RULER LAB (MISCELLANEOUS) ×2 IMPLANT
NDL EBUS SONO TIP PENTAX (NEEDLE) ×1 IMPLANT
NEEDLE EBUS SONO TIP PENTAX (NEEDLE) ×2 IMPLANT
NS IRRIG 1000ML POUR BTL (IV SOLUTION) ×2 IMPLANT
OIL SILICONE PENTAX (PARTS (SERVICE/REPAIRS)) ×2 IMPLANT
PAD ARMBOARD 7.5X6 YLW CONV (MISCELLANEOUS) ×4 IMPLANT
SYR 20CC LL (SYRINGE) ×3 IMPLANT
SYR 20ML ECCENTRIC (SYRINGE) ×3 IMPLANT
SYR 50ML SLIP (SYRINGE) IMPLANT
SYR 5ML LUER SLIP (SYRINGE) ×2 IMPLANT
TOWEL OR 17X24 6PK STRL BLUE (TOWEL DISPOSABLE) ×2 IMPLANT
TRAP SPECIMEN MUCOUS 40CC (MISCELLANEOUS) IMPLANT
TUBE CONNECTING 20X1/4 (TUBING) ×3 IMPLANT
UNDERPAD 30X30 (UNDERPADS AND DIAPERS) ×2 IMPLANT
VALVE DISPOSABLE (MISCELLANEOUS) ×3 IMPLANT
WATER STERILE IRR 1000ML POUR (IV SOLUTION) ×2 IMPLANT

## 2017-07-04 NOTE — Anesthesia Procedure Notes (Signed)
Procedure Name: Intubation Date/Time: 07/04/2017 12:27 PM Performed by: Lowella Dell, CRNA Pre-anesthesia Checklist: Patient identified, Emergency Drugs available, Suction available and Patient being monitored Patient Re-evaluated:Patient Re-evaluated prior to induction Oxygen Delivery Method: Circle System Utilized Preoxygenation: Pre-oxygenation with 100% oxygen Induction Type: IV induction Ventilation: Mask ventilation without difficulty Laryngoscope Size: Mac and 3 Grade View: Grade I Tube type: Oral Number of attempts: 1 Airway Equipment and Method: Stylet and LTA kit utilized Placement Confirmation: ETT inserted through vocal cords under direct vision,  positive ETCO2 and breath sounds checked- equal and bilateral Secured at: 19 cm Tube secured with: Tape Dental Injury: Teeth and Oropharynx as per pre-operative assessment

## 2017-07-04 NOTE — Anesthesia Preprocedure Evaluation (Addendum)
Anesthesia Evaluation  Patient identified by MRN, date of birth, ID band Patient awake    Reviewed: Allergy & Precautions, NPO status , Patient's Chart, lab work & pertinent test results, reviewed documented beta blocker date and time   Airway Mallampati: II  TM Distance: >3 FB Neck ROM: Full    Dental  (+) Upper Dentures, Lower Dentures   Pulmonary pneumonia, resolved, Current Smoker,    Pulmonary exam normal breath sounds clear to auscultation       Cardiovascular hypertension, Pt. on medications and Pt. on home beta blockers + angina + CAD, + Cardiac Stents and + Peripheral Vascular Disease  Normal cardiovascular exam Rhythm:Regular Rate:Normal  MI 1999 Stents x 2 1999  2004, 2013 DES LAD   Neuro/Psych PSYCHIATRIC DISORDERS Depression CVA    GI/Hepatic Neg liver ROS, GERD  Medicated and Controlled,  Endo/Other  diabetes, Well Controlled, Type 2, Oral Hypoglycemic Agents, Insulin DependentHyperlipidemia  Renal/GU   negative genitourinary   Musculoskeletal  (+) Arthritis , Osteoarthritis,    Abdominal   Peds  Hematology Plavix- last dose 06/29/2017   Anesthesia Other Findings   Reproductive/Obstetrics                          Anesthesia Physical Anesthesia Plan  ASA: III  Anesthesia Plan: General   Post-op Pain Management:    Induction: Intravenous  PONV Risk Score and Plan: 3 and Midazolam, Dexamethasone, Ondansetron and Treatment may vary due to age or medical condition  Airway Management Planned: Oral ETT  Additional Equipment:   Intra-op Plan:   Post-operative Plan: Extubation in OR  Informed Consent: I have reviewed the patients History and Physical, chart, labs and discussed the procedure including the risks, benefits and alternatives for the proposed anesthesia with the patient or authorized representative who has indicated his/her understanding and acceptance.   Dental  advisory given  Plan Discussed with: Anesthesiologist, CRNA and Surgeon  Anesthesia Plan Comments:         Anesthesia Quick Evaluation

## 2017-07-04 NOTE — Anesthesia Postprocedure Evaluation (Signed)
Anesthesia Post Note  Patient: TANAYSIA BHARDWAJ  Procedure(s) Performed: VIDEO BRONCHOSCOPY WITH ENDOBRONCHIAL ULTRASOUND (N/A )     Patient location during evaluation: PACU Anesthesia Type: General Level of consciousness: awake and alert Pain management: pain level controlled Vital Signs Assessment: post-procedure vital signs reviewed and stable Respiratory status: spontaneous breathing, nonlabored ventilation, respiratory function stable and patient connected to nasal cannula oxygen Cardiovascular status: blood pressure returned to baseline and stable Postop Assessment: no apparent nausea or vomiting Anesthetic complications: no    Last Vitals:  Vitals:   07/04/17 1500 07/04/17 2015  BP: 127/68 (!) 148/65  Pulse: 68 72  Resp: 16   Temp: 37.2 C 37.2 C  SpO2: 97% 100%    Last Pain:  Vitals:   07/04/17 2015  TempSrc: Oral  PainSc:                  Dekayla Prestridge A.

## 2017-07-04 NOTE — Transfer of Care (Signed)
Immediate Anesthesia Transfer of Care Note  Patient: Katherine Cole  Procedure(s) Performed: VIDEO BRONCHOSCOPY WITH ENDOBRONCHIAL ULTRASOUND (N/A )  Patient Location: PACU  Anesthesia Type:General  Level of Consciousness: awake, alert , oriented and patient cooperative  Airway & Oxygen Therapy: Patient Spontanous Breathing and Patient connected to face mask oxygen  Post-op Assessment: Report given to RN and Post -op Vital signs reviewed and stable  Post vital signs: Reviewed and stable  Last Vitals:  Vitals Value Taken Time  BP 162/69 07/04/2017  1:45 PM  Temp    Pulse 66 07/04/2017  1:47 PM  Resp 27 07/04/2017  1:47 PM  SpO2 100 % 07/04/2017  1:47 PM  Vitals shown include unvalidated device data.  Last Pain:  Vitals:   07/04/17 0415  TempSrc: Oral  PainSc: 0-No pain         Complications: No apparent anesthesia complications

## 2017-07-04 NOTE — Interval H&P Note (Signed)
PCCM Interval Note  Pt stable overnight. Scheduled for FOB + EBUS today to eval L hilar mass.   All questions answered. No barriers to proceeding.   Discussed with Pt's daughter at bedside.   Baltazar Apo, MD, PhD 07/04/2017, 12:10 PM [facility] [phone] or if no answer [phone]

## 2017-07-04 NOTE — Op Note (Signed)
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  Estimated Blood Loss: Minimal  Complications: None apparent  Indications and History: Katherine Cole is a 77 y.o. female with hx tobacco use, found to have a L lung mass, L hilar mass.  Recommendation was made to achieve a tissue diagnosis via bronchoscopy within bronchial ultrasound and biopsies.  The risks, benefits, complications, treatment options and expected outcomes were discussed with the patient.  The possibilities of pneumothorax, pneumonia, reaction to medication, pulmonary aspiration, perforation of a viscus, bleeding, failure to diagnose a condition and creating a complication requiring transfusion or operation were discussed with the patient who freely signed the consent.    Description of Procedure: The patient was examined in the preoperative area and history and data from the preprocedure consultation were reviewed. It was deemed appropriate to proceed.  The patient was taken to OR 10, identified as Katherine Cole and the procedure verified as Flexible Video Fiberoptic Bronchoscopy.  A Time Out was held and the above information confirmed. After being taken to the operating room general anesthesia was initiated and the patient  was orally intubated. The video fiberoptic bronchoscope was introduced via the endotracheal tube and a general inspection was performed which showed normal right-sided airways.  There was a raised hypopigmented vascular endobronchial lesion noted in the lingular airway at approximately 9 o'clock position.  Also noted was a smaller raised endobronchial lesion in the left upper lobe bronchus at approximately 8  o'clock position.  Endobronchial brushings and endobronchial biopsies were performed at each of these locations and sent for pathology and cytology. The standard scope was then withdrawn and the endobronchial ultrasound was used to identify and characterize the peritracheal, hilar and bronchial lymph nodes. Inspection showed enlargement of 4L lymph nodes.  Also noted was contiguous left hilar mass that seemed to extend from the 10L to the  11L, 12L regions. Using real-time ultrasound guidance Wang needle biopsies were take from Station 4L, 11L, 12L nodes and were sent for cytology. The patient tolerated the procedure well without apparent complications. There was no significant blood loss. The bronchoscope was withdrawn. Anesthesia was reversed and the patient was taken to the PACU for recovery.   Samples: 1. Wang needle biopsies from 4L node 2. Wang needle biopsies from 11L node 3. Wang needle biopsies from 12L node 4.  Endobronchial brushings from left upper lobe airway 5.  Endobronchial brushings from lingular airway 6.  Endobronchial biopsies from lingular airway 7.  Endobronchial biopsies from left upper lobe airway  Plans:  The patient will be discharged from the PACU back to her floor bed when recovered from anesthesia. We will review the cytology, pathology results with the patient when they become available.    Baltazar Apo, MD, PhD 07/04/2017, 1:42 PM [facility] [phone] or if no answer [phone]

## 2017-07-04 NOTE — Progress Notes (Addendum)
,                                                                                   Patient Demographics:    Katherine Cole, is a 77 y.o. female, DOB - 1940-06-06, YQM:[id number]  Admit date - 06/30/2017   Admitting Physician Quintella Baton, MD  Outpatient Primary MD for the patient is [name], [name], MD  LOS - 3   Chief Complaint  Patient presents with  . Pneumonia        Subjective:    Katherine Cole today has no fevers, no emesis,  No chest pain, resting comfortably, no new concerns, 2 daughters and other family members at bedside,  Assessment  & Plan :    Principal Problem:   CAP (community acquired pneumonia) Active Problems:   Uncontrolled type 2 DM with peripheral circulatory disorder (HCC)   Hyperlipidemia   CAD (coronary artery disease)   Tobacco abuse   Hypertension   Acute CVA (cerebrovascular accident) ([place])   Lung mass   Malnutrition of moderate degree  Brief summary 77 year old smoker admitted on 07/01/2017 with shortness of breath, cough and weight loss and found to have central mass with lympyhadenopathy, likely mucus plugging vs bronchial mass and a peripheral based area of consolidation or mass in the left upper lobe,Had endobronchial ultrasound-guided needle aspiration of the hilar mass and fiberoptic bronchoscopy on 07/04/2017   Plan:- 1)Lt  Lung Mass-in a smoker with concerns about possible malignancy, s/p endobronchial ultrasound-guided needle aspiration of the hilar mass and fiberoptic bronchoscopy on 07/04/17 by Dr Lamonte Sakai,.  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 use Novolog/Humalog Sliding scale insulin with Accu-Cheks/Fingersticks as ordered,    3)CAD-no ACS type symptoms , last stents more than 10 years ago, resume aspirin  and Plavix on 07/05/2017 continue metoprolol 50 mg daily, continue Lipitor 80 mg daily  4)Moderate protein caloric malnutrition/anorexia-patient tried and did not tolerate Remeron previously, patient will decide if she wants to go with Marinol or Megace  Code Status : Full  Disposition Plan  : TBD  Family communication-discussed with patient and daughter Katherine Cole and Katherine Cole  Consults  :  PCCM  Procedures- Flexible video fiberoptic bronchoscopy with endobronchial ultrasound and biopsies on 07/04/17   DVT Prophylaxis  :   Heparin/SCD---   Lab Results  Component Value Date   PLT 366 07/01/2017   Inpatient Medications  Scheduled Meds: . ALPRAZolam  0.5 mg Oral QHS  . atorvastatin  80 mg Oral Daily  . doxycycline  100 mg Oral Q12H  . escitalopram  10 mg Oral Daily  . feeding supplement (ENSURE ENLIVE)  237 mL Oral BID BM  . guaiFENesin  600 mg Oral BID  . heparin  5,000 Units Subcutaneous Q8H  . insulin aspart  0-9 Units Subcutaneous TID WC  . insulin glargine  12 Units Subcutaneous QHS  . metoprolol succinate  50 mg Oral Daily  . nicotine  21 mg Transdermal Daily   Continuous Infusions: . cefTRIAXone (ROCEPHIN)  IV  Stopped (07/04/17 0359)  . lactated ringers 10 mL/hr at 07/04/17 1129   PRN Meds:.acetaminophen **OR** acetaminophen, hydrALAZINE, ipratropium-albuterol, nitroGLYCERIN, polyethylene glycol, promethazine   Anti-infectives (From admission, onward)   Start     Dose/Rate Route Frequency Ordered Stop   07/03/17 1200  doxycycline (VIBRA-TABS) tablet 100 mg     100 mg Oral Every 12 hours 07/03/17 1103     07/01/17 0230  doxycycline (VIBRAMYCIN) 100 mg in sodium chloride 0.9 % 250 mL IVPB  Status:  Discontinued     100 mg 125 mL/hr over 120 Minutes Intravenous Every 12 hours 07/01/17 0039 07/03/17 1103   07/01/17 0045  cefTRIAXone (ROCEPHIN) 1 g in sodium chloride 0.9 % 100 mL IVPB     1 g 200 mL/hr over 30 Minutes Intravenous Every 24 hours 07/01/17 0039     06/30/17  2330  cefTRIAXone (ROCEPHIN) 1 g in sodium chloride 0.9 % 100 mL IVPB  Status:  Discontinued     1 g 200 mL/hr over 30 Minutes Intravenous  Once 06/30/17 2320 07/01/17 0149   06/30/17 2330  azithromycin (ZITHROMAX) 500 mg in sodium chloride 0.9 % 250 mL IVPB     500 mg 250 mL/hr over 60 Minutes Intravenous  Once 06/30/17 2320 07/01/17 0230       Objective:   Vitals:   07/04/17 1415 07/04/17 1430 07/04/17 1445 07/04/17 1500  BP: 134/69 (!) 142/57 132/62 127/68  Pulse: 68 67 69 68  Resp: 14 (!) 21 16 16   Temp:   98.8 F (37.1 C) 98.9 F (37.2 C)  TempSrc:    Oral  SpO2: 96% 95% 97% 97%  Weight:      Height:        Wt Readings from Last 3 Encounters:  07/02/17 44.9 kg (99 lb)  08/11/15 44.9 kg (99 lb)  01/22/14 49.2 kg (108 lb 7.6 oz)    Intake/Output Summary (Last 24 hours) at 07/04/2017 1802 Last data filed at 07/04/2017 1700 Gross per 24 hour  Intake 940 ml  Output 5 ml  Net 935 ml    Physical Exam  Gen:- Awake Alert,  In no apparent distress  HEENT:- [place].AT, No sclera icterus Neck-Supple Neck,No JVD,.  Lungs-mostly clear, diminished breath sounds, CV- S1, S2 normal Abd-  +ve B.Sounds, Abd Soft, No tenderness,    Extremity/Skin:- No  edema,   good pulses Psych-affect is appropriate, oriented x3 Neuro-no new focal deficits, no tremors   Data Review:   Micro Results Recent Results (from the past 240 hour(s))  Culture, blood (routine x 2)     Status: None (Preliminary result)   Collection Time: 07/01/17 12:53 AM  Result Value Ref Range Status   Specimen Description BLOOD RIGHT ARM  Final   Special Requests   Final    BOTTLES DRAWN AEROBIC AND ANAEROBIC Blood Culture adequate volume   Culture   Final    NO GROWTH 3 DAYS Performed at [facility], [place] [address], [place], [place] [address]    Report Status PENDING  Incomplete  Culture, blood (routine x 2)     Status: None (Preliminary result)   Collection Time: 07/01/17  1:20 AM  Result Value Ref Range  Status   Specimen Description BLOOD RIGHT FOREARM  Final   Special Requests IN PEDIATRIC BOTTLE Blood Culture adequate volume  Final   Culture   Final    NO GROWTH 3 DAYS Performed at [facility], [place] [address] [place], [place] [address]  Report Status PENDING  Incomplete  Culture, expectorated sputum-assessment     Status: None   Collection Time: 07/01/17  2:23 AM  Result Value Ref Range Status   Specimen Description EXPECTORATED SPUTUM  Final   Special Requests NONE  Final   Sputum evaluation   Final    THIS SPECIMEN IS ACCEPTABLE FOR SPUTUM CULTURE Performed at [facility], [place] [address], [place], [place] [address]    Report Status 07/01/2017 FINAL  Final  Culture, respiratory (NON-Expectorated)     Status: None   Collection Time: 07/01/17  2:23 AM  Result Value Ref Range Status   Specimen Description EXPECTORATED SPUTUM  Final   Special Requests NONE Reflexed from [id number]  Final   Gram Stain   Final    MODERATE WBC PRESENT, PREDOMINANTLY PMN ABUNDANT GRAM POSITIVE COCCI IN PAIRS MODERATE GRAM NEGATIVE COCCOBACILLI    Culture   Final    Consistent with normal respiratory flora. Performed at [facility], [place] [address] [place], [place] [address]    Report Status 07/03/2017 FINAL  Final  Gastrointestinal Panel by PCR , Stool     Status: None   Collection Time: 07/01/17 10:25 AM  Result Value Ref Range Status   Campylobacter species NOT DETECTED NOT DETECTED Final   Plesimonas shigelloides NOT DETECTED NOT DETECTED Final   Salmonella species NOT DETECTED NOT DETECTED Final   Yersinia enterocolitica NOT DETECTED NOT DETECTED Final   Vibrio species NOT DETECTED NOT DETECTED Final   Vibrio cholerae NOT DETECTED NOT DETECTED Final   Enteroaggregative E coli (EAEC) NOT DETECTED NOT DETECTED Final   Enteropathogenic E coli (EPEC) NOT DETECTED NOT DETECTED Final   Enterotoxigenic E coli (ETEC) NOT DETECTED NOT DETECTED Final   Shiga like toxin producing  E coli (STEC) NOT DETECTED NOT DETECTED Final   Shigella/Enteroinvasive E coli (EIEC) NOT DETECTED NOT DETECTED Final   Cryptosporidium NOT DETECTED NOT DETECTED Final   Cyclospora cayetanensis NOT DETECTED NOT DETECTED Final   Entamoeba histolytica NOT DETECTED NOT DETECTED Final   Giardia lamblia NOT DETECTED NOT DETECTED Final   Adenovirus F40/41 NOT DETECTED NOT DETECTED Final   Astrovirus NOT DETECTED NOT DETECTED Final   Norovirus GI/GII NOT DETECTED NOT DETECTED Final   Rotavirus A NOT DETECTED NOT DETECTED Final   Sapovirus (I, II, IV, and V) NOT DETECTED NOT DETECTED Final    Comment: Performed at [facility], [place] [place], [place] [address]  C difficile quick scan w PCR reflex     Status: None   Collection Time: 07/01/17 10:25 AM  Result Value Ref Range Status   C Diff antigen NEGATIVE NEGATIVE Final   C Diff toxin NEGATIVE NEGATIVE Final   C Diff interpretation No C. difficile detected.  Final    Comment: Performed at [facility], [place] [address] [place], [place] [address]  Surgical pcr screen     Status: None   Collection Time: 07/04/17 10:52 AM  Result Value Ref Range Status   MRSA, PCR NEGATIVE NEGATIVE Final   Staphylococcus aureus NEGATIVE NEGATIVE Final    Comment: (NOTE) The Xpert SA Assay (FDA approved for NASAL specimens in patients 23 years of age and older), is one component of a comprehensive surveillance program. It is not intended to diagnose infection nor to guide or monitor treatment. Performed at [facility], [place] [address] [place], [place] [address]     Radiology Reports Dg Chest 2 View  Result Date: 06/30/2017 CLINICAL DATA:  Cough weakness vomiting and diarrhea EXAM: CHEST - 2 VIEW COMPARISON:  09/10/2014 FINDINGS: Hyperinflation. Ill-defined left upper lobe peripheral and parenchymal disease. Ill-defined opacity in the right upper lobe, possibly with cavitation. Normal heart size with aortic atherosclerosis. No  pneumothorax. Degenerative changes of the spine. Asymmetric fullness of the left hilum. IMPRESSION: Hyperinflation with ill-defined upper lobe pulmonary opacity with mild pleural changes in the left upper lobe and possible cavitation of the lesion in the right upper lobe. Asymmetric fullness of the left hilum which may reflect nodes. CT chest recommended for further evaluation. Electronically Signed   By: Donavan Foil M.D.   On: 06/30/2017 18:20   Ct Chest W Contrast  Result Date: 06/30/2017 CLINICAL DATA:  77 year old female with cough and congestion. EXAM: CT CHEST WITH CONTRAST TECHNIQUE: Multidetector CT imaging of the chest was performed during intravenous contrast administration. CONTRAST:  72 milliliters ISOVUE-300 IOPAMIDOL (ISOVUE-300) INJECTION 61% COMPARISON:  Chest radiographs 1813 hours today and earlier. Chest CTA 02/09/2003 FINDINGS: Cardiovascular: Extensive calcified coronary artery atherosclerosis and/or stents. Less severe Calcified aortic atherosclerosis. No cardiomegaly. No pericardial effusion. The other major mediastinal vascular structures appear patent, although there is extrinsic mass effect on the left pulmonary artery branches, see below. Mediastinum/Nodes: Left hilar mass versus conglomerate metastatic nodal disease measuring up to 17 mm in thickness and encasing the left upper lobe airway (series 3, image 66). Bulky AP window lymphadenopathy measuring 18 mm short axis. Other mediastinal nodal stations and the right hilar lymph nodes appear more normal. Lungs/Pleura: Pulmonary hyperinflation. Lobulated and spiculated up to 3.7 centimeter diameter masslike opacity along the left peripheral lung at the level of the hilum (series 3, image 50 and sagittal image 118) with no air bronchograms. Surrounding spiculation and septal thickening. Abnormal peribronchial soft tissue like nodularity tracking to the hilum as seen on series 5, image 60. These are in the left upper lobe. Superimposed  mild peribronchial ground-glass nodularity in the left lower lobe. No left pleural effusion. In the right lung there is lateral upper lobe, lateral segment right middle lobe and right lower lobe peribronchial ground-glass nodularity. No right pleural effusion. Upper Abdomen: Negative visible liver, spleen, left adrenal gland, right adrenal gland, right kidney, and pancreas in the upper abdomen. Left kidney remarkable for calcified vascular disease or nephrolithiasis. There is air in fluid distending the stomach although there is superimposed gastric mucosal hyperenhancement (series 3, image 127 which is not particularly masslike. Musculoskeletal: Osteopenia. No rib destruction associated with the left lateral lung mass which borders the left lateral 4th and 5th ribs. No acute or suspicious osseous lesion identified in the chest. IMPRESSION: 1. 3.7 cm spiculated mass in the lateral periphery of the left upper lobe at the level of the hilum is Bronchogenic Carcinoma until proven otherwise. Associated with abnormal peribronchial soft tissue nodularity tracking to the left hilum and bulky, malignant appearing left hilar and AP window lymphadenopathy. 2. Underlying pulmonary hyperinflation and superimposed multilobar bilateral peribronchial ground-glass nodularity compatible with a Superimposed Acute Viral/atypical Respiratory Infection. No pleural effusion. 3. Nonspecific gastric fundal mucosal hyperenhancement, perhaps acute gastritis. 4. Coronary artery and Aortic Atherosclerosis (ICD10-I70.0). Electronically Signed   By: Genevie Ann M.D.   On: 06/30/2017 22:38     CBC Recent Labs  Lab 06/30/17 1523 07/01/17 0053 07/01/17 0605  WBC 11.3* 9.6 10.4  HGB 11.5* 11.2* 10.0*  HCT 34.3* 32.2* 29.9*  PLT 424* 365 366  MCV 86.6 85.4 86.7  MCH 29.0 29.7 29.0  MCHC 33.5 34.8 33.4  RDW 13.5 13.4 13.6  LYMPHSABS 2.8  --   --   MONOABS 0.7  --   --   EOSABS 0.1  --   --   BASOSABS 0.0  --   --     Chemistries    Recent Labs  Lab 06/30/17 1523 07/01/17 0053 07/01/17 0605 07/03/17 0508 07/04/17 0832  NA 133*  --  135 140 135  K 4.3  --  3.8 3.3* 4.5  CL 97*  --  102 104 102  CO2 23  --  23 26 24   GLUCOSE 281*  --  227* 53* 144*  BUN 31*  --  18 12 13   CREATININE 0.90 0.89 0.80 0.65 0.66  CALCIUM 9.2  --  8.3* 8.4* 8.3*   ------------------------------------------------------------------------------------------------------------------ No results for input(s): CHOL, HDL, LDLCALC, TRIG, CHOLHDL, LDLDIRECT in the last 72 hours.  Lab Results  Component Value Date   HGBA1C 10.6 (H) 01/21/2014   ------------------------------------------------------------------------------------------------------------------ No results for input(s): TSH, T4TOTAL, T3FREE, THYROIDAB in the last 72 hours.  Invalid input(s): FREET3 ------------------------------------------------------------------------------------------------------------------ No results for input(s): VITAMINB12, FOLATE, FERRITIN, TIBC, IRON, RETICCTPCT in the last 72 hours.  Coagulation profile Recent Labs  Lab 07/01/17 0605  INR 1.01    No results for input(s): DDIMER in the last 72 hours.  Cardiac Enzymes No results for input(s): CKMB, TROPONINI, MYOGLOBIN in the last 168 hours.  Invalid input(s): CK ------------------------------------------------------------------------------------------------------------------ No results found for: BNP   Roxan Hockey M.D on 07/04/2017 at 6:02 PM  Between 7am to 7pm - Pager - [phone]  After 7pm go to [email or web address] - password [email or web address]  [facility] -  Office  [phone]   Voice Recognition Viviann Spare dictation system was used to create this note, attempts have been made to correct errors. Please contact the author with questions and/or clarifications.

## 2017-07-05 ENCOUNTER — Encounter (HOSPITAL_COMMUNITY): Payer: Self-pay | Admitting: Emergency Medicine

## 2017-07-05 DIAGNOSIS — I159 Secondary hypertension, unspecified: Secondary | ICD-10-CM

## 2017-07-05 LAB — GLUCOSE, CAPILLARY
GLUCOSE-CAPILLARY: 89 mg/dL (ref 65–99)
GLUCOSE-CAPILLARY: 234 mg/dL — AB (ref 65–99)
Glucose-Capillary: 223 mg/dL — ABNORMAL HIGH (ref 65–99)
Glucose-Capillary: 100 mg/dL — ABNORMAL HIGH (ref 65–99)

## 2017-07-05 MED ORDER — FLUCONAZOLE 200 MG PO TABS
200.0000 mg | ORAL_TABLET | Freq: Every day | ORAL | Status: AC
  Administered 2017-07-05 – 2017-07-06 (×2): 200 mg via ORAL
  Filled 2017-07-05 (×2): qty 1

## 2017-07-05 NOTE — Progress Notes (Signed)
,                                                                                   Patient Demographics:    Katherine Cole, is a 77 y.o. female, DOB - 1940-07-25, ELF:[id number]  Admit date - 06/30/2017   Admitting Physician Quintella Baton, MD  Outpatient Primary MD for the patient is [name], [name], MD  LOS - 4   Chief Complaint  Patient presents with  . Pneumonia        Subjective:    Katherine Cole today has no fevers, no emesis,  No chest pain, resting comfortably,  family members at bedside, complains of itchy vaginal discharge patient believes she has yeast  Assessment  & Plan :    Principal Problem:   CAP (community acquired pneumonia) Active Problems:   Uncontrolled type 2 DM with peripheral circulatory disorder (HCC)   Hyperlipidemia   CAD (coronary artery disease)   Tobacco abuse   Hypertension   Acute CVA (cerebrovascular accident) ([place])   Lung mass   Malnutrition of moderate degree  Brief summary 77 year old smoker admitted on 07/01/2017 with shortness of breath, cough and weight loss and found to have central mass with lympyhadenopathy, likely mucus plugging vs bronchial mass and a peripheral based area of consolidation or mass in the left upper lobe,Had endobronchial ultrasound-guided needle aspiration of the hilar mass and fiberoptic bronchoscopy on 07/04/2017   Plan:- 1)Lt  Lung Mass-in a smoker with concerns about possible malignancy, s/p endobronchial ultrasound-guided needle aspiration of the hilar mass and fiberoptic bronchoscopy on 07/04/17 by Dr Lamonte Sakai,.  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 use Novolog/Humalog Sliding scale insulin with Accu-Cheks/Fingersticks as ordered,    3)CAD-no ACS type symptoms , last stents more than 10 years  ago, resume aspirin and Plavix on 07/05/2017 continue metoprolol 50 mg daily, continue Lipitor 80 mg daily  4)Moderate protein caloric malnutrition/anorexia-patient tried and did not tolerate Remeron previously, patient will decide if she wants to go with Marinol or Megace  5) candidiasis of the vulvovaginal area-Diflucan as prescribed Code Status : Full  Disposition Plan  : TBD  Family communication-discussed with patient and daughter [name] and [name]  Consults  :  PCCM  Procedures- Flexible video fiberoptic bronchoscopy with endobronchial ultrasound and biopsies on 07/04/17   DVT Prophylaxis  :   Heparin/SCD---   Lab Results  Component Value Date   PLT 366 07/01/2017   Inpatient Medications  Scheduled Meds: . ALPRAZolam  0.5 mg Oral QHS  . atorvastatin  80 mg Oral Daily  . doxycycline  100 mg Oral Q12H  . escitalopram  10 mg Oral Daily  . feeding supplement (ENSURE ENLIVE)  237 mL Oral BID BM  . fluconazole  200 mg Oral Daily  . guaiFENesin  600 mg Oral BID  . heparin  5,000 Units Subcutaneous Q8H  . insulin aspart  0-9 Units Subcutaneous TID WC  . insulin glargine  12 Units Subcutaneous QHS  . metoprolol succinate  50 mg Oral  Daily  . nicotine  21 mg Transdermal Daily   Continuous Infusions: . cefTRIAXone (ROCEPHIN)  IV Stopped (07/05/17 0200)  . lactated ringers 10 mL/hr at 07/04/17 1129   PRN Meds:.acetaminophen **OR** acetaminophen, hydrALAZINE, ipratropium-albuterol, nitroGLYCERIN, polyethylene glycol, promethazine   Anti-infectives (From admission, onward)   Start     Dose/Rate Route Frequency Ordered Stop   07/05/17 1315  fluconazole (DIFLUCAN) tablet 200 mg     200 mg Oral Daily 07/05/17 1312 07/07/17 0959   07/03/17 1200  doxycycline (VIBRA-TABS) tablet 100 mg     100 mg Oral Every 12 hours 07/03/17 1103     07/01/17 0230  doxycycline (VIBRAMYCIN) 100 mg in sodium chloride 0.9 % 250 mL IVPB  Status:  Discontinued     100 mg 125 mL/hr over 120 Minutes  Intravenous Every 12 hours 07/01/17 0039 07/03/17 1103   07/01/17 0045  cefTRIAXone (ROCEPHIN) 1 g in sodium chloride 0.9 % 100 mL IVPB     1 g 200 mL/hr over 30 Minutes Intravenous Every 24 hours 07/01/17 0039     06/30/17 2330  cefTRIAXone (ROCEPHIN) 1 g in sodium chloride 0.9 % 100 mL IVPB  Status:  Discontinued     1 g 200 mL/hr over 30 Minutes Intravenous  Once 06/30/17 2320 07/01/17 0149   06/30/17 2330  azithromycin (ZITHROMAX) 500 mg in sodium chloride 0.9 % 250 mL IVPB     500 mg 250 mL/hr over 60 Minutes Intravenous  Once 06/30/17 2320 07/01/17 0230       Objective:   Vitals:   07/04/17 1500 07/04/17 2015 07/05/17 0539 07/05/17 1310  BP: 127/68 (!) 148/65 (!) 149/81 (!) 174/62  Pulse: 68 72 62 65  Resp: 16   18  Temp: 98.9 F (37.2 C) 98.9 F (37.2 C) 98.6 F (37 C) 99.5 F (37.5 C)  TempSrc: Oral Oral Oral Oral  SpO2: 97% 100% 98% 99%  Weight:      Height:        Wt Readings from Last 3 Encounters:  07/02/17 44.9 kg (99 lb)  08/11/15 44.9 kg (99 lb)  01/22/14 49.2 kg (108 lb 7.6 oz)   No intake or output data in the 24 hours ending 07/05/17 1746  Physical Exam  Gen:- Awake Alert,  In no apparent distress  HEENT:- [place].AT, No sclera icterus Neck-Supple Neck,No JVD,.  Lungs-mostly clear, diminished breath sounds, CV- S1, S2 normal Abd-  +ve B.Sounds, Abd Soft, No tenderness,    Extremity/Skin:- No  edema,   good pulses Psych-affect is appropriate, oriented x3 Neuro-no new focal deficits, no tremors   Data Review:   Micro Results Recent Results (from the past 240 hour(s))  Culture, blood (routine x 2)     Status: None (Preliminary result)   Collection Time: 07/01/17 12:53 AM  Result Value Ref Range Status   Specimen Description BLOOD RIGHT ARM  Final   Special Requests   Final    BOTTLES DRAWN AEROBIC AND ANAEROBIC Blood Culture adequate volume   Culture   Final    NO GROWTH 4 DAYS Performed at [facility], [place] [address] [place], [place]  [address]    Report Status PENDING  Incomplete  Culture, blood (routine x 2)     Status: None (Preliminary result)   Collection Time: 07/01/17  1:20 AM  Result Value Ref Range Status   Specimen Description BLOOD RIGHT FOREARM  Final   Special Requests IN PEDIATRIC BOTTLE Blood Culture adequate volume  Final  Culture   Final    NO GROWTH 4 DAYS Performed at [facility], [place] [address], [place], [place] [address]    Report Status PENDING  Incomplete  Culture, expectorated sputum-assessment     Status: None   Collection Time: 07/01/17  2:23 AM  Result Value Ref Range Status   Specimen Description EXPECTORATED SPUTUM  Final   Special Requests NONE  Final   Sputum evaluation   Final    THIS SPECIMEN IS ACCEPTABLE FOR SPUTUM CULTURE Performed at [facility], [place] [address], [place], [place] [address]    Report Status 07/01/2017 FINAL  Final  Culture, respiratory (NON-Expectorated)     Status: None   Collection Time: 07/01/17  2:23 AM  Result Value Ref Range Status   Specimen Description EXPECTORATED SPUTUM  Final   Special Requests NONE Reflexed from [id number]  Final   Gram Stain   Final    MODERATE WBC PRESENT, PREDOMINANTLY PMN ABUNDANT GRAM POSITIVE COCCI IN PAIRS MODERATE GRAM NEGATIVE COCCOBACILLI    Culture   Final    Consistent with normal respiratory flora. Performed at [facility], [place] [address], [place], [place] [address]    Report Status 07/03/2017 FINAL  Final  Gastrointestinal Panel by PCR , Stool     Status: None   Collection Time: 07/01/17 10:25 AM  Result Value Ref Range Status   Campylobacter species NOT DETECTED NOT DETECTED Final   Plesimonas shigelloides NOT DETECTED NOT DETECTED Final   Salmonella species NOT DETECTED NOT DETECTED Final   Yersinia enterocolitica NOT DETECTED NOT DETECTED Final   Vibrio species NOT DETECTED NOT DETECTED Final   Vibrio cholerae NOT DETECTED NOT DETECTED Final   Enteroaggregative E coli (EAEC) NOT DETECTED  NOT DETECTED Final   Enteropathogenic E coli (EPEC) NOT DETECTED NOT DETECTED Final   Enterotoxigenic E coli (ETEC) NOT DETECTED NOT DETECTED Final   Shiga like toxin producing E coli (STEC) NOT DETECTED NOT DETECTED Final   Shigella/Enteroinvasive E coli (EIEC) NOT DETECTED NOT DETECTED Final   Cryptosporidium NOT DETECTED NOT DETECTED Final   Cyclospora cayetanensis NOT DETECTED NOT DETECTED Final   Entamoeba histolytica NOT DETECTED NOT DETECTED Final   Giardia lamblia NOT DETECTED NOT DETECTED Final   Adenovirus F40/41 NOT DETECTED NOT DETECTED Final   Astrovirus NOT DETECTED NOT DETECTED Final   Norovirus GI/GII NOT DETECTED NOT DETECTED Final   Rotavirus A NOT DETECTED NOT DETECTED Final   Sapovirus (I, II, IV, and V) NOT DETECTED NOT DETECTED Final    Comment: Performed at [facility], [place] [place], [place] [address]  C difficile quick scan w PCR reflex     Status: None   Collection Time: 07/01/17 10:25 AM  Result Value Ref Range Status   C Diff antigen NEGATIVE NEGATIVE Final   C Diff toxin NEGATIVE NEGATIVE Final   C Diff interpretation No C. difficile detected.  Final    Comment: Performed at [facility], [place] [address], [place],  [address]  Surgical pcr screen     Status: None   Collection Time: 07/04/17 10:52 AM  Result Value Ref Range Status   MRSA, PCR NEGATIVE NEGATIVE Final   Staphylococcus aureus NEGATIVE NEGATIVE Final    Comment: (NOTE) The Xpert SA Assay (FDA approved for NASAL specimens in patients 36 years of age and older), is one component of a comprehensive surveillance program. It is not intended to diagnose infection nor to guide  or monitor treatment. Performed at [facility], [place] [address] [place], [place] [address]     Radiology Reports Dg Chest 2 View  Result Date: 06/30/2017 CLINICAL DATA:  Cough weakness vomiting and diarrhea EXAM: CHEST - 2 VIEW COMPARISON:  09/10/2014 FINDINGS: Hyperinflation.  Ill-defined left upper lobe peripheral and parenchymal disease. Ill-defined opacity in the right upper lobe, possibly with cavitation. Normal heart size with aortic atherosclerosis. No pneumothorax. Degenerative changes of the spine. Asymmetric fullness of the left hilum. IMPRESSION: Hyperinflation with ill-defined upper lobe pulmonary opacity with mild pleural changes in the left upper lobe and possible cavitation of the lesion in the right upper lobe. Asymmetric fullness of the left hilum which may reflect nodes. CT chest recommended for further evaluation. Electronically Signed   By: Donavan Foil M.D.   On: 06/30/2017 18:20   Ct Chest W Contrast  Result Date: 06/30/2017 CLINICAL DATA:  77 year old female with cough and congestion. EXAM: CT CHEST WITH CONTRAST TECHNIQUE: Multidetector CT imaging of the chest was performed during intravenous contrast administration. CONTRAST:  72 milliliters ISOVUE-300 IOPAMIDOL (ISOVUE-300) INJECTION 61% COMPARISON:  Chest radiographs 1813 hours today and earlier. Chest CTA 02/09/2003 FINDINGS: Cardiovascular: Extensive calcified coronary artery atherosclerosis and/or stents. Less severe Calcified aortic atherosclerosis. No cardiomegaly. No pericardial effusion. The other major mediastinal vascular structures appear patent, although there is extrinsic mass effect on the left pulmonary artery branches, see below. Mediastinum/Nodes: Left hilar mass versus conglomerate metastatic nodal disease measuring up to 17 mm in thickness and encasing the left upper lobe airway (series 3, image 66). Bulky AP window lymphadenopathy measuring 18 mm short axis. Other mediastinal nodal stations and the right hilar lymph nodes appear more normal. Lungs/Pleura: Pulmonary hyperinflation. Lobulated and spiculated up to 3.7 centimeter diameter masslike opacity along the left peripheral lung at the level of the hilum (series 3, image 50 and sagittal image 118) with no air bronchograms. Surrounding  spiculation and septal thickening. Abnormal peribronchial soft tissue like nodularity tracking to the hilum as seen on series 5, image 60. These are in the left upper lobe. Superimposed mild peribronchial ground-glass nodularity in the left lower lobe. No left pleural effusion. In the right lung there is lateral upper lobe, lateral segment right middle lobe and right lower lobe peribronchial ground-glass nodularity. No right pleural effusion. Upper Abdomen: Negative visible liver, spleen, left adrenal gland, right adrenal gland, right kidney, and pancreas in the upper abdomen. Left kidney remarkable for calcified vascular disease or nephrolithiasis. There is air in fluid distending the stomach although there is superimposed gastric mucosal hyperenhancement (series 3, image 127 which is not particularly masslike. Musculoskeletal: Osteopenia. No rib destruction associated with the left lateral lung mass which borders the left lateral 4th and 5th ribs. No acute or suspicious osseous lesion identified in the chest. IMPRESSION: 1. 3.7 cm spiculated mass in the lateral periphery of the left upper lobe at the level of the hilum is Bronchogenic Carcinoma until proven otherwise. Associated with abnormal peribronchial soft tissue nodularity tracking to the left hilum and bulky, malignant appearing left hilar and AP window lymphadenopathy. 2. Underlying pulmonary hyperinflation and superimposed multilobar bilateral peribronchial ground-glass nodularity compatible with a Superimposed Acute Viral/atypical Respiratory Infection. No pleural effusion. 3. Nonspecific gastric fundal mucosal hyperenhancement, perhaps acute gastritis. 4. Coronary artery and Aortic Atherosclerosis (ICD10-I70.0). Electronically Signed   By: Genevie Ann M.D.   On: 06/30/2017 22:38     CBC Recent Labs  Lab 06/30/17 1523 07/01/17 0053 07/01/17 0605  WBC 11.3*  9.6 10.4  HGB 11.5* 11.2* 10.0*  HCT 34.3* 32.2* 29.9*  PLT 424* 365 366  MCV 86.6 85.4  86.7  MCH 29.0 29.7 29.0  MCHC 33.5 34.8 33.4  RDW 13.5 13.4 13.6  LYMPHSABS 2.8  --   --   MONOABS 0.7  --   --   EOSABS 0.1  --   --   BASOSABS 0.0  --   --     Chemistries  Recent Labs  Lab 06/30/17 1523 07/01/17 0053 07/01/17 0605 07/03/17 0508 07/04/17 0832  NA 133*  --  135 140 135  K 4.3  --  3.8 3.3* 4.5  CL 97*  --  102 104 102  CO2 23  --  23 26 24   GLUCOSE 281*  --  227* 53* 144*  BUN 31*  --  18 12 13   CREATININE 0.90 0.89 0.80 0.65 0.66  CALCIUM 9.2  --  8.3* 8.4* 8.3*   ------------------------------------------------------------------------------------------------------------------ No results for input(s): CHOL, HDL, LDLCALC, TRIG, CHOLHDL, LDLDIRECT in the last 72 hours.  Lab Results  Component Value Date   HGBA1C 10.6 (H) 01/21/2014   ------------------------------------------------------------------------------------------------------------------ No results for input(s): TSH, T4TOTAL, T3FREE, THYROIDAB in the last 72 hours.  Invalid input(s): FREET3 ------------------------------------------------------------------------------------------------------------------ No results for input(s): VITAMINB12, FOLATE, FERRITIN, TIBC, IRON, RETICCTPCT in the last 72 hours.  Coagulation profile Recent Labs  Lab 07/01/17 0605  INR 1.01    No results for input(s): DDIMER in the last 72 hours.  Cardiac Enzymes No results for input(s): CKMB, TROPONINI, MYOGLOBIN in the last 168 hours.  Invalid input(s): CK ------------------------------------------------------------------------------------------------------------------ No results found for: BNP   Roxan Hockey M.D on 07/05/2017 at 5:46 PM  Between 7am to 7pm - Pager - [phone]  After 7pm go to [email or web address] - password [email or web address]  [facility] -  Office  [phone]   Voice Recognition Viviann Spare dictation system was used to create this note, attempts have been made to correct errors. Please  contact the author with questions and/or clarifications.

## 2017-07-06 LAB — GLUCOSE, CAPILLARY
GLUCOSE-CAPILLARY: 88 mg/dL (ref 65–99)
GLUCOSE-CAPILLARY: 136 mg/dL — AB (ref 65–99)
Glucose-Capillary: 203 mg/dL — ABNORMAL HIGH (ref 65–99)
Glucose-Capillary: 289 mg/dL — ABNORMAL HIGH (ref 65–99)

## 2017-07-06 LAB — CBC
HEMATOCRIT: 31.7 % — AB (ref 36.0–46.0)
Hemoglobin: 10.2 g/dL — ABNORMAL LOW (ref 12.0–15.0)
MCH: 28.4 pg (ref 26.0–34.0)
MCHC: 32.2 g/dL (ref 30.0–36.0)
MCV: 88.3 fL (ref 78.0–100.0)
Platelets: 385 10*3/uL (ref 150–400)
RBC: 3.59 MIL/uL — ABNORMAL LOW (ref 3.87–5.11)
RDW: 13.9 % (ref 11.5–15.5)
WBC: 11.8 10*3/uL — AB (ref 4.0–10.5)

## 2017-07-06 LAB — BASIC METABOLIC PANEL
ANION GAP: 6 (ref 5–15)
BUN: 22 mg/dL — ABNORMAL HIGH (ref 6–20)
CALCIUM: 8.6 mg/dL — AB (ref 8.9–10.3)
CO2: 27 mmol/L (ref 22–32)
Chloride: 103 mmol/L (ref 101–111)
Creatinine, Ser: 0.82 mg/dL (ref 0.44–1.00)
GFR calc non Af Amer: >60 mL/min (ref >60)
Glucose, Bld: 121 mg/dL — ABNORMAL HIGH (ref 65–99)
Potassium: 4.1 mmol/L (ref 3.5–5.1)
SODIUM: 136 mmol/L (ref 135–145)

## 2017-07-06 LAB — CULTURE, BLOOD (ROUTINE X 2)
CULTURE: NO GROWTH
Culture: NO GROWTH
SPECIAL REQUESTS: ADEQUATE
Special Requests: ADEQUATE

## 2017-07-06 MED ORDER — DRONABINOL 2.5 MG PO CAPS
2.5000 mg | ORAL_CAPSULE | Freq: Two times a day (BID) | ORAL | Status: DC
  Administered 2017-07-06 – 2017-07-08 (×5): 2.5 mg via ORAL
  Filled 2017-07-06 (×5): qty 1

## 2017-07-06 MED ORDER — INSULIN GLARGINE 100 UNIT/ML ~~LOC~~ SOLN
8.0000 [IU] | Freq: Every day | SUBCUTANEOUS | Status: DC
  Administered 2017-07-06: 8 [IU] via SUBCUTANEOUS
  Filled 2017-07-06 (×3): qty 0.08

## 2017-07-06 NOTE — Progress Notes (Signed)
,                                                                                   Patient Demographics:    Katherine Cole, is a 77 y.o. female, DOB - 07/15/1940, HKV:[id number]  Admit date - 06/30/2017   Admitting Physician Quintella Baton, MD  Outpatient Primary MD for the patient is [name], [name], MD  LOS - 5   Chief Complaint  Patient presents with  . Pneumonia        Subjective:    Katherine Cole today has no fevers, no emesis,  No chest pain, daughter at bedside,   Assessment  & Plan :    Principal Problem:   CAP (community acquired pneumonia) Active Problems:   Uncontrolled type 2 DM with peripheral circulatory disorder (HCC)   Hyperlipidemia   CAD (coronary artery disease)   Tobacco abuse   Hypertension   Acute CVA (cerebrovascular accident) ([place])   Lung mass   Malnutrition of moderate degree  Brief summary 77 year old smoker admitted on 07/01/2017 with shortness of breath, cough and weight loss and found to have central mass with lympyhadenopathy, likely mucus plugging vs bronchial mass and a peripheral based area of consolidation or mass in the left upper lobe,Had endobronchial ultrasound-guided needle aspiration of the hilar mass and fiberoptic bronchoscopy on 07/04/2017, awaiting pathology report   Plan:- 1)Lt  Lung Mass-in a smoker with concerns about possible malignancy, which is status is overall improving s/p endobronchial ultrasound-guided needle aspiration of the hilar mass and fiberoptic bronchoscopy on 07/04/17 by Dr Lamonte Sakai,.  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, pathology pending, pulmonology consult appreciated, okay to stop Rocephin on 07/07/2017  (completed 7 days) and Doxycycline stop on 07/06/2017 for possible superimposed pneumonia   2)DM-blood sugars continue to run somewhat low, with decrease Lantus insulin to 8 units nightly starting 07/06/2017, use Novolog/Humalog Sliding scale insulin with Accu-Cheks/Fingersticks as ordered,     3)CAD-no ACS type symptoms , last stents more than 10 years ago, resume aspirin and Plavix on 07/05/2017 continue metoprolol 50 mg daily, continue Lipitor 80 mg daily  4)Moderate protein caloric malnutrition/anorexia-patient tried and did not tolerate Remeron previously, patient will decide if she wants to try Marinol for appetite stimulation   5) candidiasis of the vulvovaginal area-Diflucan as prescribed Code Status : Full  Disposition Plan  : TBD  Family communication-discussed with patient and daughter [name] and [name]  Consults  :  PCCM  Procedures- Flexible video fiberoptic bronchoscopy with endobronchial ultrasound and biopsies on 07/04/17   DVT Prophylaxis  :   Heparin/SCD---   Lab Results  Component Value Date   PLT 385 07/06/2017   Inpatient Medications  Scheduled Meds: . ALPRAZolam  0.5 mg Oral QHS  . atorvastatin  80 mg Oral Daily  . doxycycline  100 mg Oral Q12H  . dronabinol  2.5 mg Oral BID AC  . escitalopram  10 mg Oral Daily  . feeding supplement (ENSURE ENLIVE)  237 mL Oral BID BM  . guaiFENesin  600 mg Oral BID  . heparin  5,000 Units Subcutaneous Q8H  . insulin aspart  0-9 Units Subcutaneous TID WC  . insulin  glargine  8 Units Subcutaneous QHS  . metoprolol succinate  50 mg Oral Daily  . nicotine  21 mg Transdermal Daily   Continuous Infusions: . cefTRIAXone (ROCEPHIN)  IV Stopped (07/05/17 2340)  . lactated ringers 10 mL/hr at 07/05/17 1754   PRN Meds:.acetaminophen **OR** acetaminophen, hydrALAZINE, ipratropium-albuterol, nitroGLYCERIN, polyethylene glycol, promethazine   Anti-infectives (From admission, onward)   Start     Dose/Rate Route Frequency Ordered Stop   07/05/17 1315  fluconazole (DIFLUCAN) tablet 200 mg     200 mg Oral Daily 07/05/17 1312 07/06/17 0939   07/03/17 1200  doxycycline (VIBRA-TABS) tablet 100 mg     100 mg Oral Every 12 hours 07/03/17 1103     07/01/17 0230  doxycycline (VIBRAMYCIN) 100 mg in sodium chloride 0.9 %  250 mL IVPB  Status:  Discontinued     100 mg 125 mL/hr over 120 Minutes Intravenous Every 12 hours 07/01/17 0039 07/03/17 1103   07/01/17 0045  cefTRIAXone (ROCEPHIN) 1 g in sodium chloride 0.9 % 100 mL IVPB     1 g 200 mL/hr over 30 Minutes Intravenous Every 24 hours 07/01/17 0039     06/30/17 2330  cefTRIAXone (ROCEPHIN) 1 g in sodium chloride 0.9 % 100 mL IVPB  Status:  Discontinued     1 g 200 mL/hr over 30 Minutes Intravenous  Once 06/30/17 2320 07/01/17 0149   06/30/17 2330  azithromycin (ZITHROMAX) 500 mg in sodium chloride 0.9 % 250 mL IVPB     500 mg 250 mL/hr over 60 Minutes Intravenous  Once 06/30/17 2320 07/01/17 0230       Objective:   Vitals:   07/06/17 0520 07/06/17 0939 07/06/17 1206 07/06/17 1337  BP: (!) 157/63 (!) 150/74  139/60  Pulse: (!) 58 62  62  Resp:    18  Temp: 98.5 F (36.9 C)   98.6 F (37 C)  TempSrc: Oral   Oral  SpO2: 98%   100%  Weight:   38.3 kg (84 lb 7 oz)   Height:        Wt Readings from Last 3 Encounters:  07/06/17 38.3 kg (84 lb 7 oz)  08/11/15 44.9 kg (99 lb)  01/22/14 49.2 kg (108 lb 7.6 oz)    Intake/Output Summary (Last 24 hours) at 07/06/2017 1557 Last data filed at 07/06/2017 1503 Gross per 24 hour  Intake 895 ml  Output -  Net 895 ml    Physical Exam  Gen:- Awake Alert,  In no apparent distress  HEENT:- [place].AT, No sclera icterus Neck-Supple Neck,No JVD,.  Lungs-mostly clear, diminished breath sounds, CV- S1, S2 normal Abd-  +ve B.Sounds, Abd Soft, No tenderness,    Extremity/Skin:- No  edema,   good pulses Psych-affect is appropriate, oriented x3 Neuro-no new focal deficits, no tremors   Data Review:   Micro Results Recent Results (from the past 240 hour(s))  Culture, blood (routine x 2)     Status: None   Collection Time: 07/01/17 12:53 AM  Result Value Ref Range Status   Specimen Description BLOOD RIGHT ARM  Final   Special Requests   Final    BOTTLES DRAWN AEROBIC AND ANAEROBIC Blood Culture adequate  volume   Culture   Final    NO GROWTH 5 DAYS Performed at [facility], [place] [address], [place], [place] [address]    Report Status 07/06/2017 FINAL  Final  Culture, blood (routine x 2)     Status: None   Collection Time: 07/01/17  1:20 AM  Result Value Ref Range Status   Specimen Description BLOOD RIGHT FOREARM  Final   Special Requests IN PEDIATRIC BOTTLE Blood Culture adequate volume  Final   Culture   Final    NO GROWTH 5 DAYS Performed at [facility], [place] [address] [place], [place] [address]    Report Status 07/06/2017 FINAL  Final  Culture, expectorated sputum-assessment     Status: None   Collection Time: 07/01/17  2:23 AM  Result Value Ref Range Status   Specimen Description EXPECTORATED SPUTUM  Final   Special Requests NONE  Final   Sputum evaluation   Final    THIS SPECIMEN IS ACCEPTABLE FOR SPUTUM CULTURE Performed at [facility], [place] [address], [place], [place] [address]    Report Status 07/01/2017 FINAL  Final  Culture, respiratory (NON-Expectorated)     Status: None   Collection Time: 07/01/17  2:23 AM  Result Value Ref Range Status   Specimen Description EXPECTORATED SPUTUM  Final   Special Requests NONE Reflexed from [id number]  Final   Gram Stain   Final    MODERATE WBC PRESENT, PREDOMINANTLY PMN ABUNDANT GRAM POSITIVE COCCI IN PAIRS MODERATE GRAM NEGATIVE COCCOBACILLI    Culture   Final    Consistent with normal respiratory flora. Performed at [facility], [place] [address] [place], [place] [address]    Report Status 07/03/2017 FINAL  Final  Gastrointestinal Panel by PCR , Stool     Status: None   Collection Time: 07/01/17 10:25 AM  Result Value Ref Range Status   Campylobacter species NOT DETECTED NOT DETECTED Final   Plesimonas shigelloides NOT DETECTED NOT DETECTED Final   Salmonella species NOT DETECTED NOT DETECTED Final   Yersinia enterocolitica NOT DETECTED NOT DETECTED Final   Vibrio species NOT DETECTED NOT DETECTED  Final   Vibrio cholerae NOT DETECTED NOT DETECTED Final   Enteroaggregative E coli (EAEC) NOT DETECTED NOT DETECTED Final   Enteropathogenic E coli (EPEC) NOT DETECTED NOT DETECTED Final   Enterotoxigenic E coli (ETEC) NOT DETECTED NOT DETECTED Final   Shiga like toxin producing E coli (STEC) NOT DETECTED NOT DETECTED Final   Shigella/Enteroinvasive E coli (EIEC) NOT DETECTED NOT DETECTED Final   Cryptosporidium NOT DETECTED NOT DETECTED Final   Cyclospora cayetanensis NOT DETECTED NOT DETECTED Final   Entamoeba histolytica NOT DETECTED NOT DETECTED Final   Giardia lamblia NOT DETECTED NOT DETECTED Final   Adenovirus F40/41 NOT DETECTED NOT DETECTED Final   Astrovirus NOT DETECTED NOT DETECTED Final   Norovirus GI/GII NOT DETECTED NOT DETECTED Final   Rotavirus A NOT DETECTED NOT DETECTED Final   Sapovirus (I, II, IV, and V) NOT DETECTED NOT DETECTED Final    Comment: Performed at [facility], [place] [address] [place], [place] [phone]  C difficile quick scan w PCR reflex     Status: None   Collection Time: 07/01/17 10:25 AM  Result Value Ref Range Status   C Diff antigen NEGATIVE NEGATIVE Final   C Diff toxin NEGATIVE NEGATIVE Final   C Diff interpretation No C. difficile detected.  Final    Comment: Performed at [facility], [place] [address], [place], [place] [address]  Surgical pcr screen     Status: None   Collection Time: 07/04/17 10:52 AM  Result Value Ref Range Status   MRSA, PCR NEGATIVE NEGATIVE Final   Staphylococcus aureus NEGATIVE NEGATIVE Final    Comment: (NOTE) The Xpert  SA Assay (FDA approved for NASAL specimens in patients 42 years of age and older), is one component of a comprehensive surveillance program. It is not intended to diagnose infection nor to guide or monitor treatment. Performed at [facility], [place] [address], [place], [place] [address]     Radiology Reports Dg Chest 2 View  Result Date: 06/30/2017 CLINICAL DATA:  Cough  weakness vomiting and diarrhea EXAM: CHEST - 2 VIEW COMPARISON:  09/10/2014 FINDINGS: Hyperinflation. Ill-defined left upper lobe peripheral and parenchymal disease. Ill-defined opacity in the right upper lobe, possibly with cavitation. Normal heart size with aortic atherosclerosis. No pneumothorax. Degenerative changes of the spine. Asymmetric fullness of the left hilum. IMPRESSION: Hyperinflation with ill-defined upper lobe pulmonary opacity with mild pleural changes in the left upper lobe and possible cavitation of the lesion in the right upper lobe. Asymmetric fullness of the left hilum which may reflect nodes. CT chest recommended for further evaluation. Electronically Signed   By: Donavan Foil M.D.   On: 06/30/2017 18:20   Ct Chest W Contrast  Result Date: 06/30/2017 CLINICAL DATA:  77 year old female with cough and congestion. EXAM: CT CHEST WITH CONTRAST TECHNIQUE: Multidetector CT imaging of the chest was performed during intravenous contrast administration. CONTRAST:  72 milliliters ISOVUE-300 IOPAMIDOL (ISOVUE-300) INJECTION 61% COMPARISON:  Chest radiographs 1813 hours today and earlier. Chest CTA 02/09/2003 FINDINGS: Cardiovascular: Extensive calcified coronary artery atherosclerosis and/or stents. Less severe Calcified aortic atherosclerosis. No cardiomegaly. No pericardial effusion. The other major mediastinal vascular structures appear patent, although there is extrinsic mass effect on the left pulmonary artery branches, see below. Mediastinum/Nodes: Left hilar mass versus conglomerate metastatic nodal disease measuring up to 17 mm in thickness and encasing the left upper lobe airway (series 3, image 66). Bulky AP window lymphadenopathy measuring 18 mm short axis. Other mediastinal nodal stations and the right hilar lymph nodes appear more normal. Lungs/Pleura: Pulmonary hyperinflation. Lobulated and spiculated up to 3.7 centimeter diameter masslike opacity along the left peripheral lung at the  level of the hilum (series 3, image 50 and sagittal image 118) with no air bronchograms. Surrounding spiculation and septal thickening. Abnormal peribronchial soft tissue like nodularity tracking to the hilum as seen on series 5, image 60. These are in the left upper lobe. Superimposed mild peribronchial ground-glass nodularity in the left lower lobe. No left pleural effusion. In the right lung there is lateral upper lobe, lateral segment right middle lobe and right lower lobe peribronchial ground-glass nodularity. No right pleural effusion. Upper Abdomen: Negative visible liver, spleen, left adrenal gland, right adrenal gland, right kidney, and pancreas in the upper abdomen. Left kidney remarkable for calcified vascular disease or nephrolithiasis. There is air in fluid distending the stomach although there is superimposed gastric mucosal hyperenhancement (series 3, image 127 which is not particularly masslike. Musculoskeletal: Osteopenia. No rib destruction associated with the left lateral lung mass which borders the left lateral 4th and 5th ribs. No acute or suspicious osseous lesion identified in the chest. IMPRESSION: 1. 3.7 cm spiculated mass in the lateral periphery of the left upper lobe at the level of the hilum is Bronchogenic Carcinoma until proven otherwise. Associated with abnormal peribronchial soft tissue nodularity tracking to the left hilum and bulky, malignant appearing left hilar and AP window lymphadenopathy. 2. Underlying pulmonary hyperinflation and superimposed multilobar bilateral peribronchial ground-glass nodularity compatible with a Superimposed Acute Viral/atypical Respiratory Infection. No pleural effusion. 3. Nonspecific gastric fundal mucosal hyperenhancement, perhaps acute gastritis. 4. Coronary artery and Aortic Atherosclerosis (  ICD10-I70.0). Electronically Signed   By: Genevie Ann M.D.   On: 06/30/2017 22:38     CBC Recent Labs  Lab 06/30/17 1523 07/01/17 0053 07/01/17 0605  07/06/17 0445  WBC 11.3* 9.6 10.4 11.8*  HGB 11.5* 11.2* 10.0* 10.2*  HCT 34.3* 32.2* 29.9* 31.7*  PLT 424* 365 366 385  MCV 86.6 85.4 86.7 88.3  MCH 29.0 29.7 29.0 28.4  MCHC 33.5 34.8 33.4 32.2  RDW 13.5 13.4 13.6 13.9  LYMPHSABS 2.8  --   --   --   MONOABS 0.7  --   --   --   EOSABS 0.1  --   --   --   BASOSABS 0.0  --   --   --     Chemistries  Recent Labs  Lab 06/30/17 1523 07/01/17 0053 07/01/17 0605 07/03/17 0508 07/04/17 0832 07/06/17 0445  NA 133*  --  135 140 135 136  K 4.3  --  3.8 3.3* 4.5 4.1  CL 97*  --  102 104 102 103  CO2 23  --  23 26 24 27   GLUCOSE 281*  --  227* 53* 144* 121*  BUN 31*  --  18 12 13  22*  CREATININE 0.90 0.89 0.80 0.65 0.66 0.82  CALCIUM 9.2  --  8.3* 8.4* 8.3* 8.6*   ------------------------------------------------------------------------------------------------------------------ No results for input(s): CHOL, HDL, LDLCALC, TRIG, CHOLHDL, LDLDIRECT in the last 72 hours.  Lab Results  Component Value Date   HGBA1C 10.6 (H) 01/21/2014   ------------------------------------------------------------------------------------------------------------------ No results for input(s): TSH, T4TOTAL, T3FREE, THYROIDAB in the last 72 hours.  Invalid input(s): FREET3 ------------------------------------------------------------------------------------------------------------------ No results for input(s): VITAMINB12, FOLATE, FERRITIN, TIBC, IRON, RETICCTPCT in the last 72 hours.  Coagulation profile Recent Labs  Lab 07/01/17 0605  INR 1.01    No results for input(s): DDIMER in the last 72 hours.  Cardiac Enzymes No results for input(s): CKMB, TROPONINI, MYOGLOBIN in the last 168 hours.  Invalid input(s): CK ------------------------------------------------------------------------------------------------------------------ No results found for: BNP   Roxan Hockey M.D on 07/06/2017 at 3:57 PM  Between 7am to 7pm - Pager -  [phone]  After 7pm go to [email or web address] - password [email or web address]  [facility] -  Office  [phone]   Voice Recognition Viviann Spare dictation system was used to create this note, attempts have been made to correct errors. Please contact the author with questions and/or clarifications.

## 2017-07-07 LAB — GLUCOSE, CAPILLARY
GLUCOSE-CAPILLARY: 136 mg/dL — AB (ref 65–99)
Glucose-Capillary: 152 mg/dL — ABNORMAL HIGH (ref 65–99)
Glucose-Capillary: 139 mg/dL — ABNORMAL HIGH (ref 65–99)
Glucose-Capillary: 189 mg/dL — ABNORMAL HIGH (ref 65–99)

## 2017-07-07 MED ORDER — AMLODIPINE BESYLATE 2.5 MG PO TABS
2.5000 mg | ORAL_TABLET | Freq: Every day | ORAL | Status: DC
  Administered 2017-07-07 – 2017-07-08 (×2): 2.5 mg via ORAL
  Filled 2017-07-07 (×2): qty 1

## 2017-07-07 NOTE — Care Management Important Message (Signed)
Important Message  Patient Details  Name: Katherine Cole MRN: [id number] Date of Birth: 05-04-1940   Medicare Important Message Given:  Yes    Orbie Pyo 07/07/2017, 3:06 PM

## 2017-07-07 NOTE — Plan of Care (Signed)
  Problem: Education: Goal: Knowledge of General Education information will improve Outcome: Progressing   Problem: Health Behavior/Discharge Planning: Goal: Ability to manage health-related needs will improve Outcome: Progressing   Problem: Clinical Measurements: Goal: Ability to maintain clinical measurements within normal limits will improve Outcome: Progressing Goal: Will remain free from infection Outcome: Progressing Goal: Diagnostic test results will improve Outcome: Progressing Goal: Respiratory complications will improve Outcome: Progressing Goal: Cardiovascular complication will be avoided Outcome: Progressing   Problem: Elimination: Goal: Will not experience complications related to bowel motility Outcome: Progressing Goal: Will not experience complications related to urinary retention Outcome: Progressing   Problem: Pain Managment: Goal: General experience of comfort will improve Outcome: Progressing   Problem: Safety: Goal: Ability to remain free from injury will improve Outcome: Progressing   Problem: Skin Integrity: Goal: Risk for impaired skin integrity will decrease Outcome: Progressing

## 2017-07-07 NOTE — Progress Notes (Signed)
,                                                                                   Patient Demographics:    Katherine Cole, is a 77 y.o. female, DOB - 08/19/1940, ZYY:[id number]  Admit date - 06/30/2017   Admitting Physician Quintella Baton, MD  Outpatient Primary MD for the patient is [name], [name], MD  LOS - 6   Chief Complaint  Patient presents with  . Pneumonia        Subjective:    Katherine Cole today has no fevers, no emesis,  No chest pain, patient is anxious about her tissue pathology is not back yet,   Assessment  & Plan :    Principal Problem:   CAP (community acquired pneumonia) Active Problems:   Uncontrolled type 2 DM with peripheral circulatory disorder (HCC)   Hyperlipidemia   CAD (coronary artery disease)   Tobacco abuse   Hypertension   Acute CVA (cerebrovascular accident) ([place])   Lung mass   Malnutrition of moderate degree  Brief summary 77 year old smoker admitted on 07/01/2017 with shortness of breath, cough and weight loss and found to have central mass with lympyhadenopathy, likely mucus plugging vs bronchial mass and a peripheral based area of consolidation or mass in the left upper lobe,Had endobronchial ultrasound-guided needle aspiration of the hilar mass and fiberoptic bronchoscopy on 07/04/2017, awaiting pathology report   Plan:- 1)Lt  Lung Mass-in a smoker with concerns about possible malignancy, which is status is overall improving s/p endobronchial ultrasound-guided needle aspiration of the hilar mass and fiberoptic bronchoscopy on 07/04/17 by Dr Lamonte Sakai,.  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, pathology pending, pulmonology consult appreciated, okay to stop Rocephin on 07/07/2017  (completed 7 days) and Doxycycline stop on 07/06/2017 for possible superimposed pneumonia, still awaiting biopsy pathology results to determine next plan of care   2)DM-blood sugars continue to run somewhat low, with decrease Lantus insulin to 8 units  nightly starting 07/06/2017, use Novolog/Humalog Sliding scale insulin with Accu-Cheks/Fingersticks as ordered,    3)CAD-no ACS type symptoms , last stents more than 10 years ago, resume aspirin and Plavix on 07/05/2017 continue metoprolol 50 mg daily, continue Lipitor 80 mg daily  4)Moderate protein caloric malnutrition/anorexia-patient tried and did not tolerate Remeron previously, patient will decide if she wants to try Marinol for appetite stimulation   5) candidiasis of the vulvovaginal area-Diflucan as prescribed Code Status : Full  Disposition Plan  : TBD  Family communication-discussed with patient and daughter Katherine Cole and Katherine Cole  Consults  :  PCCM  Procedures- Flexible video fiberoptic bronchoscopy with endobronchial ultrasound and biopsies on 07/04/17   DVT Prophylaxis  :   Heparin/SCD---   Lab Results  Component Value Date   PLT 385 07/06/2017   Inpatient Medications  Scheduled Meds: . amLODipine  2.5 mg Oral Daily  . atorvastatin  80 mg Oral Daily  . dronabinol  2.5 mg Oral BID AC  . escitalopram  10 mg Oral Daily  . feeding supplement (ENSURE ENLIVE)  237 mL Oral BID BM  . guaiFENesin  600 mg Oral BID  . heparin  5,000 Units Subcutaneous Q8H  .  insulin aspart  0-9 Units Subcutaneous TID WC  . insulin glargine  8 Units Subcutaneous QHS  . metoprolol succinate  50 mg Oral Daily  . nicotine  21 mg Transdermal Daily   Continuous Infusions: . lactated ringers 10 mL/hr at 07/05/17 1754   PRN Meds:.acetaminophen **OR** acetaminophen, hydrALAZINE, ipratropium-albuterol, nitroGLYCERIN, polyethylene glycol, promethazine   Anti-infectives (From admission, onward)   Start     Dose/Rate Route Frequency Ordered Stop   07/05/17 1315  fluconazole (DIFLUCAN) tablet 200 mg     200 mg Oral Daily 07/05/17 1312 07/06/17 0939   07/03/17 1200  doxycycline (VIBRA-TABS) tablet 100 mg     100 mg Oral Every 12 hours 07/03/17 1103 07/07/17 0959   07/01/17 0230  doxycycline  (VIBRAMYCIN) 100 mg in sodium chloride 0.9 % 250 mL IVPB  Status:  Discontinued     100 mg 125 mL/hr over 120 Minutes Intravenous Every 12 hours 07/01/17 0039 07/03/17 1103   07/01/17 0045  cefTRIAXone (ROCEPHIN) 1 g in sodium chloride 0.9 % 100 mL IVPB     1 g 200 mL/hr over 30 Minutes Intravenous Every 24 hours 07/01/17 0039 07/07/17 0552   06/30/17 2330  cefTRIAXone (ROCEPHIN) 1 g in sodium chloride 0.9 % 100 mL IVPB  Status:  Discontinued     1 g 200 mL/hr over 30 Minutes Intravenous  Once 06/30/17 2320 07/01/17 0149   06/30/17 2330  azithromycin (ZITHROMAX) 500 mg in sodium chloride 0.9 % 250 mL IVPB     500 mg 250 mL/hr over 60 Minutes Intravenous  Once 06/30/17 2320 07/01/17 0230       Objective:   Vitals:   07/06/17 2038 07/07/17 0524 07/07/17 1310 07/07/17 1733  BP: (!) 154/73 (!) 166/56 (!) 178/82 (!) 145/67  Pulse: (!) 55 (!) 56  (!) 58  Resp: 17 16 16    Temp: 98.7 F (37.1 C) 98.4 F (36.9 C) 98.7 F (37.1 C) 98.2 F (36.8 C)  TempSrc: Oral Oral Oral Oral  SpO2: 99% 100% 95% 99%  Weight:      Height:        Wt Readings from Last 3 Encounters:  07/06/17 38.3 kg (84 lb 7 oz)  08/11/15 44.9 kg (99 lb)  01/22/14 49.2 kg (108 lb 7.6 oz)    Intake/Output Summary (Last 24 hours) at 07/07/2017 1945 Last data filed at 07/07/2017 0930 Gross per 24 hour  Intake 120 ml  Output -  Net 120 ml    Physical Exam  Gen:- Awake Alert,  In no apparent distress  HEENT:- [place].AT, No sclera icterus Neck-Supple Neck,No JVD,.  Lungs-mostly clear, diminished breath sounds, CV- S1, S2 normal Abd-  +ve B.Sounds, Abd Soft, No tenderness,    Extremity/Skin:- No  edema,   good pulses Psych-affect is appropriate, oriented x3 Neuro-no new focal deficits, no tremors   Data Review:   Micro Results Recent Results (from the past 240 hour(s))  Culture, blood (routine x 2)     Status: None   Collection Time: 07/01/17 12:53 AM  Result Value Ref Range Status   Specimen Description BLOOD  RIGHT ARM  Final   Special Requests   Final    BOTTLES DRAWN AEROBIC AND ANAEROBIC Blood Culture adequate volume   Culture   Final    NO GROWTH 5 DAYS Performed at [facility], [place] [address], [place], [place] [address]    Report Status 07/06/2017 FINAL  Final  Culture, blood (routine x 2)  Status: None   Collection Time: 07/01/17  1:20 AM  Result Value Ref Range Status   Specimen Description BLOOD RIGHT FOREARM  Final   Special Requests IN PEDIATRIC BOTTLE Blood Culture adequate volume  Final   Culture   Final    NO GROWTH 5 DAYS Performed at [facility], [place] [address] [place], [place] [address]    Report Status 07/06/2017 FINAL  Final  Culture, expectorated sputum-assessment     Status: None   Collection Time: 07/01/17  2:23 AM  Result Value Ref Range Status   Specimen Description EXPECTORATED SPUTUM  Final   Special Requests NONE  Final   Sputum evaluation   Final    THIS SPECIMEN IS ACCEPTABLE FOR SPUTUM CULTURE Performed at [facility], [place] [address], [place], [place] [address]    Report Status 07/01/2017 FINAL  Final  Culture, respiratory (NON-Expectorated)     Status: None   Collection Time: 07/01/17  2:23 AM  Result Value Ref Range Status   Specimen Description EXPECTORATED SPUTUM  Final   Special Requests NONE Reflexed from [id number]  Final   Gram Stain   Final    MODERATE WBC PRESENT, PREDOMINANTLY PMN ABUNDANT GRAM POSITIVE COCCI IN PAIRS MODERATE GRAM NEGATIVE COCCOBACILLI    Culture   Final    Consistent with normal respiratory flora. Performed at [facility], [place] [address] [place], [place] [address]    Report Status 07/03/2017 FINAL  Final  Gastrointestinal Panel by PCR , Stool     Status: None   Collection Time: 07/01/17 10:25 AM  Result Value Ref Range Status   Campylobacter species NOT DETECTED NOT DETECTED Final   Plesimonas shigelloides NOT DETECTED NOT DETECTED Final   Salmonella species NOT DETECTED NOT DETECTED  Final   Yersinia enterocolitica NOT DETECTED NOT DETECTED Final   Vibrio species NOT DETECTED NOT DETECTED Final   Vibrio cholerae NOT DETECTED NOT DETECTED Final   Enteroaggregative E coli (EAEC) NOT DETECTED NOT DETECTED Final   Enteropathogenic E coli (EPEC) NOT DETECTED NOT DETECTED Final   Enterotoxigenic E coli (ETEC) NOT DETECTED NOT DETECTED Final   Shiga like toxin producing E coli (STEC) NOT DETECTED NOT DETECTED Final   Shigella/Enteroinvasive E coli (EIEC) NOT DETECTED NOT DETECTED Final   Cryptosporidium NOT DETECTED NOT DETECTED Final   Cyclospora cayetanensis NOT DETECTED NOT DETECTED Final   Entamoeba histolytica NOT DETECTED NOT DETECTED Final   Giardia lamblia NOT DETECTED NOT DETECTED Final   Adenovirus F40/41 NOT DETECTED NOT DETECTED Final   Astrovirus NOT DETECTED NOT DETECTED Final   Norovirus GI/GII NOT DETECTED NOT DETECTED Final   Rotavirus A NOT DETECTED NOT DETECTED Final   Sapovirus (I, II, IV, and V) NOT DETECTED NOT DETECTED Final    Comment: Performed at [facility], [place] [place], [place] [address]  C difficile quick scan w PCR reflex     Status: None   Collection Time: 07/01/17 10:25 AM  Result Value Ref Range Status   C Diff antigen NEGATIVE NEGATIVE Final   C Diff toxin NEGATIVE NEGATIVE Final   C Diff interpretation No C. difficile detected.  Final    Comment: Performed at [facility], [place] [address], [place], [place] [address]  Surgical pcr screen     Status: None   Collection Time: 07/04/17 10:52 AM  Result Value Ref Range Status   MRSA, PCR NEGATIVE NEGATIVE Final   Staphylococcus aureus NEGATIVE NEGATIVE  Final    Comment: (NOTE) The Xpert SA Assay (FDA approved for NASAL specimens in patients 57 years of age and older), is one component of a comprehensive surveillance program. It is not intended to diagnose infection nor to guide or monitor treatment. Performed at [facility], [place] [address],  [place], [place] [address]     Radiology Reports Dg Chest 2 View  Result Date: 06/30/2017 CLINICAL DATA:  Cough weakness vomiting and diarrhea EXAM: CHEST - 2 VIEW COMPARISON:  09/10/2014 FINDINGS: Hyperinflation. Ill-defined left upper lobe peripheral and parenchymal disease. Ill-defined opacity in the right upper lobe, possibly with cavitation. Normal heart size with aortic atherosclerosis. No pneumothorax. Degenerative changes of the spine. Asymmetric fullness of the left hilum. IMPRESSION: Hyperinflation with ill-defined upper lobe pulmonary opacity with mild pleural changes in the left upper lobe and possible cavitation of the lesion in the right upper lobe. Asymmetric fullness of the left hilum which may reflect nodes. CT chest recommended for further evaluation. Electronically Signed   By: Donavan Foil M.D.   On: 06/30/2017 18:20   Ct Chest W Contrast  Result Date: 06/30/2017 CLINICAL DATA:  77 year old female with cough and congestion. EXAM: CT CHEST WITH CONTRAST TECHNIQUE: Multidetector CT imaging of the chest was performed during intravenous contrast administration. CONTRAST:  72 milliliters ISOVUE-300 IOPAMIDOL (ISOVUE-300) INJECTION 61% COMPARISON:  Chest radiographs 1813 hours today and earlier. Chest CTA 02/09/2003 FINDINGS: Cardiovascular: Extensive calcified coronary artery atherosclerosis and/or stents. Less severe Calcified aortic atherosclerosis. No cardiomegaly. No pericardial effusion. The other major mediastinal vascular structures appear patent, although there is extrinsic mass effect on the left pulmonary artery branches, see below. Mediastinum/Nodes: Left hilar mass versus conglomerate metastatic nodal disease measuring up to 17 mm in thickness and encasing the left upper lobe airway (series 3, image 66). Bulky AP window lymphadenopathy measuring 18 mm short axis. Other mediastinal nodal stations and the right hilar lymph nodes appear more normal. Lungs/Pleura: Pulmonary  hyperinflation. Lobulated and spiculated up to 3.7 centimeter diameter masslike opacity along the left peripheral lung at the level of the hilum (series 3, image 50 and sagittal image 118) with no air bronchograms. Surrounding spiculation and septal thickening. Abnormal peribronchial soft tissue like nodularity tracking to the hilum as seen on series 5, image 60. These are in the left upper lobe. Superimposed mild peribronchial ground-glass nodularity in the left lower lobe. No left pleural effusion. In the right lung there is lateral upper lobe, lateral segment right middle lobe and right lower lobe peribronchial ground-glass nodularity. No right pleural effusion. Upper Abdomen: Negative visible liver, spleen, left adrenal gland, right adrenal gland, right kidney, and pancreas in the upper abdomen. Left kidney remarkable for calcified vascular disease or nephrolithiasis. There is air in fluid distending the stomach although there is superimposed gastric mucosal hyperenhancement (series 3, image 127 which is not particularly masslike. Musculoskeletal: Osteopenia. No rib destruction associated with the left lateral lung mass which borders the left lateral 4th and 5th ribs. No acute or suspicious osseous lesion identified in the chest. IMPRESSION: 1. 3.7 cm spiculated mass in the lateral periphery of the left upper lobe at the level of the hilum is Bronchogenic Carcinoma until proven otherwise. Associated with abnormal peribronchial soft tissue nodularity tracking to the left hilum and bulky, malignant appearing left hilar and AP window lymphadenopathy. 2. Underlying pulmonary hyperinflation and superimposed multilobar bilateral peribronchial ground-glass nodularity compatible with a Superimposed Acute Viral/atypical Respiratory Infection. No pleural effusion. 3. Nonspecific gastric fundal mucosal hyperenhancement, perhaps  acute gastritis. 4. Coronary artery and Aortic Atherosclerosis (ICD10-I70.0). Electronically  Signed   By: Genevie Ann M.D.   On: 06/30/2017 22:38     CBC Recent Labs  Lab 07/01/17 0053 07/01/17 0605 07/06/17 0445  WBC 9.6 10.4 11.8*  HGB 11.2* 10.0* 10.2*  HCT 32.2* 29.9* 31.7*  PLT 365 366 385  MCV 85.4 86.7 88.3  MCH 29.7 29.0 28.4  MCHC 34.8 33.4 32.2  RDW 13.4 13.6 13.9    Chemistries  Recent Labs  Lab 07/01/17 0053 07/01/17 0605 07/03/17 0508 07/04/17 0832 07/06/17 0445  NA  --  135 140 135 136  K  --  3.8 3.3* 4.5 4.1  CL  --  102 104 102 103  CO2  --  23 26 24 27   GLUCOSE  --  227* 53* 144* 121*  BUN  --  18 12 13  22*  CREATININE 0.89 0.80 0.65 0.66 0.82  CALCIUM  --  8.3* 8.4* 8.3* 8.6*   ------------------------------------------------------------------------------------------------------------------ No results for input(s): CHOL, HDL, LDLCALC, TRIG, CHOLHDL, LDLDIRECT in the last 72 hours.  Lab Results  Component Value Date   HGBA1C 10.6 (H) 01/21/2014   ------------------------------------------------------------------------------------------------------------------ No results for input(s): TSH, T4TOTAL, T3FREE, THYROIDAB in the last 72 hours.  Invalid input(s): FREET3 ------------------------------------------------------------------------------------------------------------------ No results for input(s): VITAMINB12, FOLATE, FERRITIN, TIBC, IRON, RETICCTPCT in the last 72 hours.  Coagulation profile Recent Labs  Lab 07/01/17 0605  INR 1.01    No results for input(s): DDIMER in the last 72 hours.  Cardiac Enzymes No results for input(s): CKMB, TROPONINI, MYOGLOBIN in the last 168 hours.  Invalid input(s): CK ------------------------------------------------------------------------------------------------------------------ No results found for: BNP   Roxan Hockey M.D on 07/07/2017 at 7:45 PM  Between 7am to 7pm - Pager - [phone]  After 7pm go to [email or web address] - password [email or web address]  [facility] -  Office   [phone]   Voice Recognition Viviann Spare dictation system was used to create this note, attempts have been made to correct errors. Please contact the author with questions and/or clarifications.

## 2017-07-08 DIAGNOSIS — C3412 Malignant neoplasm of upper lobe, left bronchus or lung: Secondary | ICD-10-CM | POA: Diagnosis present

## 2017-07-08 LAB — CBC
HEMATOCRIT: 32.1 % — AB (ref 36.0–46.0)
Hemoglobin: 10.3 g/dL — ABNORMAL LOW (ref 12.0–15.0)
MCH: 28.3 pg (ref 26.0–34.0)
MCHC: 32.1 g/dL (ref 30.0–36.0)
MCV: 88.2 fL (ref 78.0–100.0)
Platelets: 439 10*3/uL — ABNORMAL HIGH (ref 150–400)
RBC: 3.64 MIL/uL — ABNORMAL LOW (ref 3.87–5.11)
RDW: 14.1 % (ref 11.5–15.5)
WBC: 11.3 10*3/uL — AB (ref 4.0–10.5)

## 2017-07-08 LAB — BASIC METABOLIC PANEL
ANION GAP: 12 (ref 5–15)
BUN: 22 mg/dL — ABNORMAL HIGH (ref 6–20)
CALCIUM: 8.9 mg/dL (ref 8.9–10.3)
CO2: 25 mmol/L (ref 22–32)
Chloride: 98 mmol/L — ABNORMAL LOW (ref 101–111)
Creatinine, Ser: 0.72 mg/dL (ref 0.44–1.00)
Glucose, Bld: 141 mg/dL — ABNORMAL HIGH (ref 65–99)
POTASSIUM: 4.3 mmol/L (ref 3.5–5.1)
Sodium: 135 mmol/L (ref 135–145)

## 2017-07-08 LAB — GLUCOSE, CAPILLARY
GLUCOSE-CAPILLARY: 218 mg/dL — AB (ref 65–99)
Glucose-Capillary: 150 mg/dL — ABNORMAL HIGH (ref 65–99)

## 2017-07-08 MED ORDER — HYDROCODONE-ACETAMINOPHEN 5-325 MG PO TABS: 1.0000 | ORAL_TABLET | Freq: Four times a day (QID) | ORAL | 0 refills | Status: DC | PRN

## 2017-07-08 MED ORDER — ALPRAZOLAM 0.5 MG PO TABS: 0.5000 mg | ORAL_TABLET | Freq: Every evening | ORAL | 0 refills | Status: DC | PRN

## 2017-07-08 MED ORDER — ACETAMINOPHEN 325 MG PO TABS: 650.0000 mg | ORAL_TABLET | Freq: Four times a day (QID) | ORAL | 0 refills | Status: DC | PRN

## 2017-07-08 MED ORDER — ENSURE ENLIVE PO LIQD: 237.0000 mL | Freq: Two times a day (BID) | ORAL | 12 refills | Status: DC

## 2017-07-08 MED ORDER — DRONABINOL 5 MG PO CAPS: 5.0000 mg | ORAL_CAPSULE | Freq: Two times a day (BID) | ORAL | 1 refills | Status: DC

## 2017-07-08 MED ORDER — GUAIFENESIN ER 600 MG PO TB12: 600.0000 mg | ORAL_TABLET | Freq: Two times a day (BID) | ORAL | 1 refills | Status: DC

## 2017-07-08 MED ORDER — POLYETHYLENE GLYCOL 3350 17 G PO PACK: 17.0000 g | PACK | Freq: Every day | ORAL | 0 refills | Status: DC

## 2017-07-08 MED ORDER — INSULIN ASPART 100 UNIT/ML ~~LOC~~ SOLN: 0.0000 [IU] | Freq: Three times a day (TID) | SUBCUTANEOUS | 11 refills | Status: DC

## 2017-07-08 MED ORDER — ONDANSETRON 4 MG PO TBDP: 4.0000 mg | ORAL_TABLET | Freq: Three times a day (TID) | ORAL | 0 refills | Status: DC | PRN

## 2017-07-08 MED ORDER — INSULIN GLARGINE 100 UNIT/ML ~~LOC~~ SOLN: 8.0000 [IU] | Freq: Every day | SUBCUTANEOUS | 11 refills | Status: DC

## 2017-07-08 MED ORDER — AMLODIPINE BESYLATE 5 MG PO TABS: 5.0000 mg | ORAL_TABLET | Freq: Every day | ORAL | 1 refills | Status: DC

## 2017-07-08 MED ORDER — NICOTINE 14 MG/24HR TD PT24: 14.0000 mg | MEDICATED_PATCH | TRANSDERMAL | 1 refills | Status: DC

## 2017-07-08 NOTE — Discharge Instructions (Signed)
1) follow up with Dr. Earlie Server the oncologist who specializes in [place] on Friday, 07/11/2017 at 9:15 AM at [facility]........[phone].........  .. [name], RN is the thoracic oncology coordinator.... She can be reached at [phone]  2) take medications as prescribed

## 2017-07-08 NOTE — Discharge Summary (Signed)
Katherine Cole, is a 77 y.o. female  DOB 1940-04-22  MRN [id number].  Admission date:  06/30/2017  Admitting Physician  Katherine Baton, MD  Discharge Date:  07/08/2017   Primary MD  Katherine Solian, MD  Recommendations for primary care physician for things to follow:   1) follow up with Dr. Earlie Cole the oncologist who specializes in [place] on Friday, 07/11/2017 at 9:15 AM at [place]........[phone].........  .. Katherine Cole is the thoracic oncology coordinator.... She can be reached at [phone]  2) take medications as prescribed   Admission Diagnosis  Dehydration [E86.0] Atypical pneumonia [J18.9] Malignant neoplasm of overlapping sites of lung, unspecified laterality ([place]) [C34.80]   Discharge Diagnosis  Dehydration [E86.0] Atypical pneumonia [J18.9] Malignant neoplasm of overlapping sites of lung, unspecified laterality ([place]) [C34.80]    Principal Problem:   Small cell lung cancer, left ([place]) Active Problems:   Uncontrolled type 2 DM with peripheral circulatory disorder ([place])   Hyperlipidemia   CAD (coronary artery disease)   Tobacco abuse   Hypertension   Acute CVA (cerebrovascular accident) ([place])   CAP (community acquired pneumonia)   Lung mass   Malnutrition of moderate degree      Past Medical History:  Diagnosis Date  . Arthritis    "in my fingers"   . CAD (coronary artery disease)    a. MI 1999 with 2 stents at that time followed by stenting 3-4 yrs later. b. s/p DES to LAD guided by pressure wire 03/11/12.  . Colon polyps    adenomatous  . Depression   . Diabetes mellitus, type 2 ([place])   . GERD (gastroesophageal reflux disease)   . Hyperlipemia   . Hypertension   . Osteoarthritis   . Pneumonia 06/2017  . Skin cancer 2012   "nose; right middle finger"  . Stroke [facility])     Past Surgical History:  Procedure Laterality Date  .  APPENDECTOMY  ~ 1965  . CATARACT EXTRACTION    . CORONARY ANGIOPLASTY WITH STENT PLACEMENT  1999; 2004; 03/11/2012   "2 + 1 + 1; total of 4" (03/11/2012)  . FEMUR FRACTURE SURGERY  2011   RLE (03/11/2012)  . PERCUTANEOUS CORONARY STENT INTERVENTION (PCI-S) N/A 03/11/2012   Procedure: PERCUTANEOUS CORONARY STENT INTERVENTION (PCI-S);  Surgeon: Sherren Mocha, MD;  [facility]: [facility] CATH LAB;  Service: Cardiovascular;  Laterality: N/A;  . SKIN CANCER EXCISION  2013   "nose & right middle finger; actinic keratosis" (03/11/2012)  . TONSILLECTOMY AND ADENOIDECTOMY  ~ 1962  . TUBAL LIGATION  1977  . VIDEO BRONCHOSCOPY WITH ENDOBRONCHIAL ULTRASOUND N/A 07/04/2017   Procedure: VIDEO BRONCHOSCOPY WITH ENDOBRONCHIAL ULTRASOUND;  Surgeon: Collene Gobble, MD;  Location: [facility] OR;  Service: Thoracic;  Laterality: N/A;       HPI  from the history and physical done on the day of admission:     HPI: this is a 77 y/o female who has an extensive h/o tobacco abuse. She presents with c/o nausea, vomiting and diarrhea  for over 3 weeks. She also has had over a 20 pound weight lost over the past 6 months. She presents with productive cough, SOB and wheeze. She denies fever but reports chills. In the ER CT scan confirms pneumonia and possible lung cancer. The hospitalist have been asked to admit. History provided by the patient and 2 daughters present at bedside. The patient has a sick contact who was recently admitted with e coli diarrhea.      Hospital Course:     Brief summary 77 year old smoker admitted on 07/01/2017 with shortness of breath, cough and weight loss and found to have central mass with lympyhadenopathy, likely mucus plugging vs bronchial mass and a peripheral based area of consolidation or mass in the left upper lobe,Had endobronchial ultrasound-guided needle aspiration of the hilar mass and fiberoptic bronchoscopy on 07/04/2017, pathology consistent with small cell lung cancer, appointment with Dr.  Earlie Cole on 07/11/17, discussed with Katherine Cole  Plan:- 1)Lt  Lung Mass-in a smoker with concerns about possible malignancy, which is status is overall improving s/p endobronchial ultrasound-guided needle aspiration of the hilar mass and fiberoptic bronchoscopy on 07/04/17 by Dr Lamonte Sakai,.  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, pathology pending, pulmonology consult appreciated, okay to stop Rocephin on 07/07/2017  (completed 7 days) and Doxycycline stop on 07/06/2017 for possible superimposed pneumonia, pathology consistent with small cell lung cancer, appointment with Dr. Earlie Cole on 07/11/17, discussed with Katherine Cole   2)DM-more stable now   3)CAD-no ACS type symptoms , last stents more than 10 years ago, resume aspirin and Plavix on 07/05/2017 continue metoprolol 50 mg daily, continue Lipitor 80 mg daily  4)Moderate protein caloric malnutrition/anorexia-patient tried and did not tolerate Remeron previously, try Marinol for appetite stimulation  Code Status : Full  Disposition Plan  : TBD  Family communication-discussed with patient and daughter Katherine Cole and Katherine Cole  Consults  :  PCCM  Procedures- Flexible video fiberoptic bronchoscopy with endobronchial ultrasound and biopsies on 07/04/17   Discharge Condition: stable  Follow UP- pathology consistent with small cell lung cancer, appointment with Dr. Earlie Cole on 07/11/17, discussed with Katherine Cole   Diet and Activity recommendation:  As advised  Discharge Instructions     Discharge Instructions    Call MD for:  difficulty breathing, headache or visual disturbances   Complete by:  As directed    Call MD for:  persistant dizziness or light-headedness   Complete by:  As directed    Call MD for:  persistant nausea and vomiting   Complete by:  As directed    Call MD for:  severe uncontrolled pain   Complete by:  As directed     Call MD for:  temperature >100.4   Complete by:  As directed    Diet - low sodium heart healthy   Complete by:  As directed    Discharge instructions   Complete by:  As directed    1) follow up with Dr. Earlie Cole the oncologist who specializes in [place] on Friday, 07/11/2017 at 9:15 AM at [place]........[phone].........  .. Katherine Cole is the thoracic oncology coordinator.... She can be reached at [phone]  2) take medications as prescribed   Increase activity slowly   Complete by:  As directed         Discharge Medications     Allergies as of 07/08/2017      Reactions   Codeine Other (  See Comments)   "gives me a migraine" (03/11/2012)   Morphine And Related Other (See Comments)   "gives me a migraine" (03/11/2012)      Medication List    STOP taking these medications   meloxicam 15 MG tablet Commonly known as:  MOBIC   nicotine 21 mg/24hr patch Commonly known as:  NICODERM CQ - dosed in mg/24 hours Replaced by:  nicotine 14 mg/24hr patch   traMADol 50 MG tablet Commonly known as:  ULTRAM     TAKE these medications   acetaminophen 325 MG tablet Commonly known as:  TYLENOL Take 2 tablets (650 mg total) by mouth every 6 (six) hours as needed for mild pain (or Fever >/= 101).   ALPRAZolam 0.5 MG tablet Commonly known as:  XANAX Take 1 tablet (0.5 mg total) by mouth at bedtime as needed for anxiety or sleep.   amLODipine 5 MG tablet Commonly known as:  NORVASC Take 1 tablet (5 mg total) by mouth daily. Start taking on:  07/09/2017   aspirin 81 MG tablet Take 1 tablet (81 mg total) by mouth daily.   atorvastatin 80 MG tablet Commonly known as:  LIPITOR Take 80 mg by mouth daily.   clopidogrel 75 MG tablet Commonly known as:  PLAVIX Take 75 mg by mouth daily.   diclofenac sodium 1 % Gel Commonly known as:  VOLTAREN Apply 1 application topically 4 (four) times daily.   dronabinol 5 MG capsule Commonly known as:   MARINOL Take 1 capsule (5 mg total) by mouth 2 (two) times daily before lunch and supper.   escitalopram 10 MG tablet Commonly known as:  LEXAPRO Take 10 mg by mouth daily.   feeding supplement (ENSURE ENLIVE) Liqd Take 237 mLs by mouth 2 (two) times daily between meals.   guaiFENesin 600 MG 12 hr tablet Commonly known as:  MUCINEX Take 1 tablet (600 mg total) by mouth 2 (two) times daily.   HYDROcodone-acetaminophen 5-325 MG tablet Commonly known as:  NORCO Take 1 tablet by mouth every 6 (six) hours as needed for moderate pain.   insulin aspart 100 UNIT/ML injection Commonly known as:  novoLOG Inject 0-9 Units into the skin 3 (three) times daily with meals.   insulin glargine 100 UNIT/ML injection Commonly known as:  LANTUS Inject 0.08 mLs (8 Units total) into the skin at bedtime. What changed:    how much to take  when to take this   JANUVIA 100 MG tablet Generic drug:  sitaGLIPtin Take 100 mg by mouth daily.   metFORMIN 1000 MG tablet Commonly known as:  GLUCOPHAGE Take 1,000 mg by mouth 2 (two) times daily with a meal.   metoprolol succinate 50 MG 24 hr tablet Commonly known as:  TOPROL-XL Take 50 mg by mouth daily. Take with or immediately following a meal.   nicotine 14 mg/24hr patch Commonly known as:  NICODERM CQ - dosed in mg/24 hours Place 1 patch (14 mg total) onto the skin daily. Replaces:  nicotine 21 mg/24hr patch   nitroGLYCERIN 0.4 MG SL tablet Commonly known as:  NITROSTAT Place 1 tablet (0.4 mg total) under the tongue every 5 (five) minutes as needed (up to 3 doses). For chest pain   ondansetron 4 MG disintegrating tablet Commonly known as:  ZOFRAN ODT Take 1 tablet (4 mg total) by mouth every 8 (eight) hours as needed for nausea or vomiting.   ondansetron 4 MG tablet Commonly known as:  ZOFRAN Take 1 tablet (4 mg total) by  mouth every 8 (eight) hours as needed for nausea or vomiting.   polyethylene glycol packet Commonly known as:   MIRALAX / GLYCOLAX Take 17 g by mouth daily.       Major procedures and Radiology Reports - PLEASE review detailed and final reports for all details, in brief -     Dg Chest 2 View  Result Date: 06/30/2017 CLINICAL DATA:  Cough weakness vomiting and diarrhea EXAM: CHEST - 2 VIEW COMPARISON:  09/10/2014 FINDINGS: Hyperinflation. Ill-defined left upper lobe peripheral and parenchymal disease. Ill-defined opacity in the right upper lobe, possibly with cavitation. Normal heart size with aortic atherosclerosis. No pneumothorax. Degenerative changes of the spine. Asymmetric fullness of the left hilum. IMPRESSION: Hyperinflation with ill-defined upper lobe pulmonary opacity with mild pleural changes in the left upper lobe and possible cavitation of the lesion in the right upper lobe. Asymmetric fullness of the left hilum which may reflect nodes. CT chest recommended for further evaluation. Electronically Signed   By: Donavan Foil M.D.   On: 06/30/2017 18:20   Ct Chest W Contrast  Result Date: 06/30/2017 CLINICAL DATA:  77 year old female with cough and congestion. EXAM: CT CHEST WITH CONTRAST TECHNIQUE: Multidetector CT imaging of the chest was performed during intravenous contrast administration. CONTRAST:  72 milliliters ISOVUE-300 IOPAMIDOL (ISOVUE-300) INJECTION 61% COMPARISON:  Chest radiographs 1813 hours today and earlier. Chest CTA 02/09/2003 FINDINGS: Cardiovascular: Extensive calcified coronary artery atherosclerosis and/or stents. Less severe Calcified aortic atherosclerosis. No cardiomegaly. No pericardial effusion. The other major mediastinal vascular structures appear patent, although there is extrinsic mass effect on the left pulmonary artery branches, see below. Mediastinum/Nodes: Left hilar mass versus conglomerate metastatic nodal disease measuring up to 17 mm in thickness and encasing the left upper lobe airway (series 3, image 66). Bulky AP window lymphadenopathy measuring 18 mm short  axis. Other mediastinal nodal stations and the right hilar lymph nodes appear more normal. Lungs/Pleura: Pulmonary hyperinflation. Lobulated and spiculated up to 3.7 centimeter diameter masslike opacity along the left peripheral lung at the level of the hilum (series 3, image 50 and sagittal image 118) with no air bronchograms. Surrounding spiculation and septal thickening. Abnormal peribronchial soft tissue like nodularity tracking to the hilum as seen on series 5, image 60. These are in the left upper lobe. Superimposed mild peribronchial ground-glass nodularity in the left lower lobe. No left pleural effusion. In the right lung there is lateral upper lobe, lateral segment right middle lobe and right lower lobe peribronchial ground-glass nodularity. No right pleural effusion. Upper Abdomen: Negative visible liver, spleen, left adrenal gland, right adrenal gland, right kidney, and pancreas in the upper abdomen. Left kidney remarkable for calcified vascular disease or nephrolithiasis. There is air in fluid distending the stomach although there is superimposed gastric mucosal hyperenhancement (series 3, image 127 which is not particularly masslike. Musculoskeletal: Osteopenia. No rib destruction associated with the left lateral lung mass which borders the left lateral 4th and 5th ribs. No acute or suspicious osseous lesion identified in the chest. IMPRESSION: 1. 3.7 cm spiculated mass in the lateral periphery of the left upper lobe at the level of the hilum is Bronchogenic Carcinoma until proven otherwise. Associated with abnormal peribronchial soft tissue nodularity tracking to the left hilum and bulky, malignant appearing left hilar and AP window lymphadenopathy. 2. Underlying pulmonary hyperinflation and superimposed multilobar bilateral peribronchial ground-glass nodularity compatible with a Superimposed Acute Viral/atypical Respiratory Infection. No pleural effusion. 3. Nonspecific gastric fundal mucosal  hyperenhancement, perhaps acute gastritis. 4. Coronary artery  and Aortic Atherosclerosis (ICD10-I70.0). Electronically Signed   By: Genevie Ann M.D.   On: 06/30/2017 22:38    Micro Results    Recent Results (from the past 240 hour(s))  Culture, blood (routine x 2)     Status: None   Collection Time: 07/01/17 12:53 AM  Result Value Ref Range Status   Specimen Description BLOOD RIGHT ARM  Final   Special Requests   Final    BOTTLES DRAWN AEROBIC AND ANAEROBIC Blood Culture adequate volume   Culture   Final    NO GROWTH 5 DAYS Performed at [facility], [address], [place], [place] [address]    Report Status 07/06/2017 FINAL  Final  Culture, blood (routine x 2)     Status: None   Collection Time: 07/01/17  1:20 AM  Result Value Ref Range Status   Specimen Description BLOOD RIGHT FOREARM  Final   Special Requests IN PEDIATRIC BOTTLE Blood Culture adequate volume  Final   Culture   Final    NO GROWTH 5 DAYS Performed at [facility], [place] [address] [place], [place] [address]    Report Status 07/06/2017 FINAL  Final  Culture, expectorated sputum-assessment     Status: None   Collection Time: 07/01/17  2:23 AM  Result Value Ref Range Status   Specimen Description EXPECTORATED SPUTUM  Final   Special Requests NONE  Final   Sputum evaluation   Final    THIS SPECIMEN IS ACCEPTABLE FOR SPUTUM CULTURE Performed at [facility], [place] [address] [place], [place] [address]    Report Status 07/01/2017 FINAL  Final  Culture, respiratory (NON-Expectorated)     Status: None   Collection Time: 07/01/17  2:23 AM  Result Value Ref Range Status   Specimen Description EXPECTORATED SPUTUM  Final   Special Requests NONE Reflexed from [id number]  Final   Gram Stain   Final    MODERATE WBC PRESENT, PREDOMINANTLY PMN ABUNDANT GRAM POSITIVE COCCI IN PAIRS MODERATE GRAM NEGATIVE COCCOBACILLI    Culture   Final    Consistent with normal respiratory flora. Performed at [facility], [place] [address] [place], [place] [address]    Report Status 07/03/2017 FINAL  Final  Gastrointestinal Panel by PCR , Stool     Status: None   Collection Time: 07/01/17 10:25 AM  Result Value Ref Range Status   Campylobacter species NOT DETECTED NOT DETECTED Final   Plesimonas shigelloides NOT DETECTED NOT DETECTED Final   Salmonella species NOT DETECTED NOT DETECTED Final   Yersinia enterocolitica NOT DETECTED NOT DETECTED Final   Vibrio species NOT DETECTED NOT DETECTED Final   Vibrio cholerae NOT DETECTED NOT DETECTED Final   Enteroaggregative E coli (EAEC) NOT DETECTED NOT DETECTED Final   Enteropathogenic E coli (EPEC) NOT DETECTED NOT DETECTED Final   Enterotoxigenic E coli (ETEC) NOT DETECTED NOT DETECTED Final   Shiga like toxin producing E coli (STEC) NOT DETECTED NOT DETECTED Final   Shigella/Enteroinvasive E coli (EIEC) NOT DETECTED NOT DETECTED Final   Cryptosporidium NOT DETECTED NOT DETECTED Final   Cyclospora cayetanensis NOT DETECTED NOT DETECTED Final   Entamoeba histolytica NOT DETECTED NOT DETECTED Final   Giardia lamblia NOT DETECTED NOT DETECTED Final   Adenovirus F40/41 NOT DETECTED NOT DETECTED Final   Astrovirus NOT DETECTED NOT DETECTED Final   Norovirus GI/GII NOT DETECTED NOT DETECTED Final   Rotavirus A NOT DETECTED NOT DETECTED Final   Sapovirus (I, II,  IV, and V) NOT DETECTED NOT DETECTED Final    Comment: Performed at [facility], [place] [place], [place] [address]  C difficile quick scan w PCR reflex     Status: None   Collection Time: 07/01/17 10:25 AM  Result Value Ref Range Status   C Diff antigen NEGATIVE NEGATIVE Final   C Diff toxin NEGATIVE NEGATIVE Final   C Diff interpretation No C. difficile detected.  Final    Comment: Performed at [facility], [place] [address], [place], [place] [address]  Surgical pcr screen     Status: None   Collection Time: 07/04/17 10:52 AM  Result Value Ref Range Status   MRSA, PCR  NEGATIVE NEGATIVE Final   Staphylococcus aureus NEGATIVE NEGATIVE Final    Comment: (NOTE) The Xpert SA Assay (FDA approved for NASAL specimens in patients 29 years of age and older), is one component of a comprehensive surveillance program. It is not intended to diagnose infection nor to guide or monitor treatment. Performed at [facility], [place] [address], [place], [place] [address]        Today   Subjective    Katherine Cole today has no new complaints,  pathology consistent with small cell lung cancer, appointment with Dr. Earlie Cole on 07/11/17, discussed with Katherine Cole          Patient has been seen and examined prior to discharge   Objective   Blood pressure (!) 164/65, pulse (!) 59, temperature 99.4 F (37.4 C), temperature source Oral, resp. rate 12, height 5\' 1"  (1.549 m), weight 38.3 kg (84 lb 7 oz), SpO2 98 %.  No intake or output data in the 24 hours ending 07/08/17 1616  Exam Gen:- Awake Alert,  In no apparent distress  HEENT:- [place].AT, No sclera icterus Neck-Supple Neck,No JVD,.  Lungs-mostly clear, diminished breath sounds, CV- S1, S2 normal Abd-  +ve B.Sounds, Abd Soft, No tenderness,    Extremity/Skin:- No  edema,   good pulses Psych-affect is appropriate, oriented x3 Neuro-no new focal deficits, no tremors     Data Review   CBC w Diff:  Lab Results  Component Value Date   WBC 11.3 (H) 07/08/2017   HGB 10.3 (L) 07/08/2017   HCT 32.1 (L) 07/08/2017   PLT 439 (H) 07/08/2017   LYMPHOPCT 25 06/30/2017   MONOPCT 7 06/30/2017   EOSPCT 1 06/30/2017   BASOPCT 0 06/30/2017    CMP:  Lab Results  Component Value Date   NA 135 07/08/2017   K 4.3 07/08/2017   CL 98 (L) 07/08/2017   CO2 25 07/08/2017   BUN 22 (H) 07/08/2017   CREATININE 0.72 07/08/2017   PROT 7.5 05/29/2015   ALBUMIN 3.7 05/29/2015   BILITOT 0.2 (L) 05/29/2015   ALKPHOS 74 05/29/2015   AST 20 05/29/2015   ALT 15 05/29/2015  .   Total Discharge  time is about 33 minutes  Roxan Hockey M.D on 07/08/2017 at 4:16 PM  [facility]   Office  [phone]  Voice Recognition Viviann Spare dictation system was used to create this note, attempts have been made to correct errors. Please contact the author with questions and/or clarifications.

## 2017-07-08 NOTE — Progress Notes (Signed)
   Name: Katherine Cole MRN: [id number] DOB: September 20, 1940    ADMISSION DATE:  06/30/2017 CONSULTATION DATE:  3/26  REFERRING MD :  Tawanna Solo   CHIEF COMPLAINT:  Lung mass and PNA   BRIEF PATIENT DESCRIPTION:  This 77 year old female with a known history of diabetes, coronary artery disease and hypertension.  She presented to the emergency room on 3/25 with chief complaint of nausea and vomiting as well as diarrhea for approximately 3 weeks, also complained of productive cough, shortness of breath and wheezing.  In the emergency room his prior diagnostic evaluation she underwent a CT of chest.  This demonstrated a 3.7 cm left  hilar mass, as well as multilobar bilateral peribronchial groundglass nodular changes.  As well as left peripheral  density also noted.  Pulmonary asked to evaluate given the findings of the CT chest.    SIGNIFICANT EVENTS  STUDIES:   CT chest 3/25: 1. 3.7 cm spiculated mass in the lateral periphery of the left upper lobe at the level of the hilum is Bronchogenic Carcinoma until proven otherwise. Associated with abnormal peribronchial soft tissue nodularity tracking to the left hilum and bulky, malignant appearing left hilar and AP window lymphadenopathy. 2. Underlying pulmonary hyperinflation and superimposed multilobar bilateral peribronchial ground-glass nodularity compatible with a Superimposed Acute Viral/atypical Respiratory Infection. No pleural effusion. 3. Nonspecific gastric fundal mucosal hyperenhancement, perhaps acute gastritis. 4. Coronary artery and Aortic Atherosclerosis (ICD10-I70.0).  SUBJECTIVE:  No distress.   VITAL SIGNS: Temp:  [98.2 F (36.8 C)-99.4 F (37.4 C)] 99.4 F (37.4 C) (04/02 1050) Pulse Rate:  [58-63] 59 (04/02 1050) Resp:  [12-16] 12 (04/02 1050) BP: (114-168)/(50-75) 164/65 (04/02 1050) SpO2:  [98 %-100 %] 98 % (04/02 1050)  PHYSICAL EXAMINATION: Gen:      No acute distress, frail, elderly HEENT:  EOMI, sclera  anicteric Neck:     No masses; no thyromegaly Lungs:    Clear to auscultation bilaterally; normal respiratory effort CV:         Regular rate and rhythm; no murmurs Abd:      + bowel sounds; soft, non-tender; no palpable masses, no distension Ext:    No edema; adequate peripheral perfusion Skin:      Warm and dry; no rash Neuro: alert and oriented x 3 Psych: normal mood and affect  Recent Labs  Lab 07/04/17 0832 07/06/17 0445 07/08/17 0519  NA 135 136 135  K 4.5 4.1 4.3  CL 102 103 98*  CO2 24 27 25   BUN 13 22* 22*  CREATININE 0.66 0.82 0.72  GLUCOSE 144* 121* 141*   Recent Labs  Lab 07/06/17 0445 07/08/17 0519  HGB 10.2* 10.3*  HCT 31.7* 32.1*  WBC 11.8* 11.3*  PLT 385 439*   No results found.  ASSESSMENT / PLAN: LUL (left hilar and peripheral) lung mass, mediastinal LNs S/p EBUS biopsy by Dr. Lamonte Sakai. Path shows small cell lung cancer  Plan Reviewed pathology with patient and family and answered all questions. Primary team is arranging follow-up with Dr. Julien Nordmann, Oncology for management Advised discharge with nicotine patches for smoking cessation  PCCM will be available as needed during this admission.  Marshell Garfinkel MD [facility] Pager [phone] If no answer or after 3pm call: [phone] 07/08/2017, 3:22 PM

## 2017-07-08 NOTE — Plan of Care (Signed)
  Problem: Education: Goal: Knowledge of General Education information will improve Outcome: Progressing   Problem: Health Behavior/Discharge Planning: Goal: Ability to manage health-related needs will improve Outcome: Progressing   Problem: Clinical Measurements: Goal: Ability to maintain clinical measurements within normal limits will improve Outcome: Progressing Goal: Will remain free from infection Outcome: Progressing Goal: Diagnostic test results will improve Outcome: Progressing Goal: Respiratory complications will improve Outcome: Progressing Goal: Cardiovascular complication will be avoided Outcome: Progressing   Problem: Elimination: Goal: Will not experience complications related to bowel motility Outcome: Progressing Goal: Will not experience complications related to urinary retention Outcome: Progressing   Problem: Safety: Goal: Ability to remain free from injury will improve Outcome: Progressing   Problem: Skin Integrity: Goal: Risk for impaired skin integrity will decrease Outcome: Progressing

## 2017-07-09 ENCOUNTER — Telehealth: Payer: Self-pay | Admitting: Internal Medicine

## 2017-07-09 ENCOUNTER — Telehealth: Payer: Self-pay | Admitting: Medical Oncology

## 2017-07-09 NOTE — Telephone Encounter (Signed)
Scheduled appt per 4/2 sch message - patient is aware of appt date and time.

## 2017-07-09 NOTE — Telephone Encounter (Signed)
Can pt restart Plavix after bronchoscopy 3/29 ? Instructed dtr to contact prescribing MD.

## 2017-07-10 ENCOUNTER — Other Ambulatory Visit: Payer: Self-pay | Admitting: Medical Oncology

## 2017-07-10 DIAGNOSIS — C3492 Malignant neoplasm of unspecified part of left bronchus or lung: Secondary | ICD-10-CM

## 2017-07-11 ENCOUNTER — Encounter: Payer: Self-pay | Admitting: *Deleted

## 2017-07-11 ENCOUNTER — Inpatient Hospital Stay: Payer: Medicare Other | Attending: Internal Medicine | Admitting: Internal Medicine

## 2017-07-11 ENCOUNTER — Encounter: Payer: Self-pay | Admitting: Radiation Oncology

## 2017-07-11 ENCOUNTER — Inpatient Hospital Stay: Payer: Medicare Other

## 2017-07-11 ENCOUNTER — Telehealth: Payer: Self-pay | Admitting: Internal Medicine

## 2017-07-11 VITALS — BP 156/63 | HR 62 | Temp 98.7°F | Resp 18 | Ht 61.0 in | Wt 82.5 lb

## 2017-07-11 DIAGNOSIS — C3492 Malignant neoplasm of unspecified part of left bronchus or lung: Secondary | ICD-10-CM

## 2017-07-11 DIAGNOSIS — C787 Secondary malignant neoplasm of liver and intrahepatic bile duct: Secondary | ICD-10-CM | POA: Diagnosis present

## 2017-07-11 DIAGNOSIS — C3412 Malignant neoplasm of upper lobe, left bronchus or lung: Secondary | ICD-10-CM | POA: Diagnosis present

## 2017-07-11 DIAGNOSIS — Z716 Tobacco abuse counseling: Secondary | ICD-10-CM

## 2017-07-11 DIAGNOSIS — Z8673 Personal history of transient ischemic attack (TIA), and cerebral infarction without residual deficits: Secondary | ICD-10-CM | POA: Diagnosis not present

## 2017-07-11 DIAGNOSIS — Z800 Family history of malignant neoplasm of digestive organs: Secondary | ICD-10-CM | POA: Diagnosis not present

## 2017-07-11 DIAGNOSIS — Z794 Long term (current) use of insulin: Secondary | ICD-10-CM | POA: Diagnosis not present

## 2017-07-11 DIAGNOSIS — E46 Unspecified protein-calorie malnutrition: Secondary | ICD-10-CM | POA: Diagnosis not present

## 2017-07-11 DIAGNOSIS — Z452 Encounter for adjustment and management of vascular access device: Secondary | ICD-10-CM | POA: Diagnosis not present

## 2017-07-11 DIAGNOSIS — Z808 Family history of malignant neoplasm of other organs or systems: Secondary | ICD-10-CM | POA: Diagnosis not present

## 2017-07-11 DIAGNOSIS — Z86010 Personal history of colonic polyps: Secondary | ICD-10-CM | POA: Insufficient documentation

## 2017-07-11 DIAGNOSIS — E440 Moderate protein-calorie malnutrition: Secondary | ICD-10-CM

## 2017-07-11 DIAGNOSIS — Z720 Tobacco use: Secondary | ICD-10-CM | POA: Diagnosis not present

## 2017-07-11 DIAGNOSIS — R634 Abnormal weight loss: Secondary | ICD-10-CM

## 2017-07-11 DIAGNOSIS — I10 Essential (primary) hypertension: Secondary | ICD-10-CM | POA: Diagnosis not present

## 2017-07-11 DIAGNOSIS — Z5111 Encounter for antineoplastic chemotherapy: Secondary | ICD-10-CM | POA: Insufficient documentation

## 2017-07-11 DIAGNOSIS — E119 Type 2 diabetes mellitus without complications: Secondary | ICD-10-CM | POA: Diagnosis not present

## 2017-07-11 DIAGNOSIS — Z7189 Other specified counseling: Secondary | ICD-10-CM

## 2017-07-11 DIAGNOSIS — C7951 Secondary malignant neoplasm of bone: Secondary | ICD-10-CM | POA: Insufficient documentation

## 2017-07-11 DIAGNOSIS — I2510 Atherosclerotic heart disease of native coronary artery without angina pectoris: Secondary | ICD-10-CM

## 2017-07-11 DIAGNOSIS — Z801 Family history of malignant neoplasm of trachea, bronchus and lung: Secondary | ICD-10-CM | POA: Diagnosis not present

## 2017-07-11 DIAGNOSIS — Z85828 Personal history of other malignant neoplasm of skin: Secondary | ICD-10-CM | POA: Diagnosis not present

## 2017-07-11 DIAGNOSIS — I159 Secondary hypertension, unspecified: Secondary | ICD-10-CM

## 2017-07-11 LAB — CBC WITH DIFFERENTIAL (CANCER CENTER ONLY)
Basophils Absolute: 0.1 10*3/uL (ref 0.0–0.1)
Basophils Relative: 0 %
Eosinophils Absolute: 0.3 10*3/uL (ref 0.0–0.5)
Eosinophils Relative: 3 %
HEMATOCRIT: 33.7 % — AB (ref 34.8–46.6)
HEMOGLOBIN: 11 g/dL — AB (ref 11.6–15.9)
LYMPHS ABS: 2.7 10*3/uL (ref 0.9–3.3)
LYMPHS PCT: 23 %
MCH: 29.6 pg (ref 25.1–34.0)
MCHC: 32.6 g/dL (ref 31.5–36.0)
MCV: 90.8 fL (ref 79.5–101.0)
MONOS PCT: 8 %
Monocytes Absolute: 0.9 10*3/uL (ref 0.1–0.9)
NEUTROS ABS: 7.6 10*3/uL — AB (ref 1.5–6.5)
NEUTROS PCT: 66 %
Platelet Count: 391 10*3/uL (ref 145–400)
RBC: 3.71 MIL/uL (ref 3.70–5.45)
RDW: 14.6 % — ABNORMAL HIGH (ref 11.2–14.5)
WBC Count: 11.6 10*3/uL — ABNORMAL HIGH (ref 3.9–10.3)

## 2017-07-11 LAB — CMP (CANCER CENTER ONLY)
ALT: 33 U/L (ref 0–55)
AST: 23 U/L (ref 5–34)
Albumin: 2.7 g/dL — ABNORMAL LOW (ref 3.5–5.0)
Alkaline Phosphatase: 123 U/L (ref 40–150)
Anion gap: 7 (ref 3–11)
BUN: 29 mg/dL — ABNORMAL HIGH (ref 7–26)
CHLORIDE: 99 mmol/L (ref 98–109)
CO2: 29 mmol/L (ref 22–29)
Calcium: 9.5 mg/dL (ref 8.4–10.4)
Creatinine: 0.84 mg/dL (ref 0.60–1.10)
GFR, Estimated: >60 mL/min (ref >60)
Glucose, Bld: 219 mg/dL — ABNORMAL HIGH (ref 70–140)
POTASSIUM: 4.9 mmol/L (ref 3.5–5.1)
SODIUM: 135 mmol/L — AB (ref 136–145)
Total Bilirubin: 0.2 mg/dL (ref 0.2–1.2)
Total Protein: 6.9 g/dL (ref 6.4–8.3)

## 2017-07-11 MED ORDER — LIDOCAINE-PRILOCAINE 2.5-2.5 % EX CREA: 1.0000 "application " | TOPICAL_CREAM | CUTANEOUS | 0 refills | Status: DC | PRN

## 2017-07-11 NOTE — Telephone Encounter (Signed)
Appointments scheduled AVS/Calendar printed per 4/5 los

## 2017-07-11 NOTE — Progress Notes (Signed)
[facility] [phone]   [phone]  CONSULT NOTE  REFERRING PHYSICIAN: Dr. Roxan Hockey  REASON FOR CONSULTATION:  77 years old white female recently diagnosed with lung cancer.  HPI Katherine Cole is a 77 y.o. female with past medical history significant for coronary artery disease, hypertension, diabetes mellitus, osteoarthritis, colon polyps, dyslipidemia, stroke in 2015, history of skin cancer, depression, and GERD. The patient mentions that on June 30, 2016 she presented to the emergency department complaining of nausea, vomiting, diarrhea as well as productive cough and shortness of breath.  She also has a lot of wheezes and chills.  During her evaluation chest x-ray was performed and it showed hyperinflation with ill-defined upper lobe pulmonary opacity with mild pleural changes in the left upper lobe and possible cavitation of the lesion in the right upper lobe.  There was also a symmetric fullness of the left hilum.  This was followed by CT scan of the chest with contrast on the same day and that showed 3.7 cm spiculated mass in the lateral periphery of the left upper lobe at the level of the hilum suspicious for bronchogenic carcinoma.  There was associated abnormal peribronchial soft tissue nodularity tracking to the left hilum and bulky malignant appearing left hilar and AP window lymphadenopathy.  The patient was seen by Dr. Lamonte Sakai during her hospitalization and she underwent video bronchoscopy with endobronchial ultrasound on July 04, 2017. The final pathology ([id number]) showed the biopsy of the mass in the left lingula was consistent with small cell carcinoma. The patient was recently discharged from the hospital and she was referred to me today for evaluation and recommendation regarding treatment of her condition. When seen today the patient is feeling fine with no specific complaints except for cough productive of whitish sputum with  no hemoptysis.  She denied having any chest pain or shortness of breath.  She lost around 25 pounds in the last year.  She continues to complain of fatigue.  She has no visual changes or headache.  She denied having any nausea, vomiting, diarrhea or constipation. Family history significant for several family members with malignancy including brother and sister with lung cancer, brother with colon cancer and another brother with stomach cancer and mother had stomach cancer.  Her father had brain cancer. The patient is a widow and has 3 children.  She was accompanied today by her daughter Katherine Cole and her niece Katherine Cole.  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  She has a history of smoking 1 pack/day for around 60 years and unfortunately she continues to Cole few cigarettes every day.  The patient denied having any history of alcohol or drug abuse.  HPI  Past Medical History:  Diagnosis Date  . Arthritis    "in my fingers"   . CAD (coronary artery disease)    a. MI 1999 with 2 stents at that time followed by stenting 3-4 yrs later. b. s/p DES to LAD guided by pressure wire 03/11/12.  . Colon polyps    adenomatous  . Depression   . Diabetes mellitus, type 2 ([place])   . GERD (gastroesophageal reflux disease)   . Hyperlipemia   . Hypertension   . Osteoarthritis   . Pneumonia 06/2017  . Skin cancer 2012   "nose; right middle finger"  . Stroke [facility])     Past Surgical History:  Procedure Laterality Date  . APPENDECTOMY  ~ 1965  .  CATARACT EXTRACTION    . CORONARY ANGIOPLASTY WITH STENT PLACEMENT  1999; 2004; 03/11/2012   "2 + 1 + 1; total of 4" (03/11/2012)  . FEMUR FRACTURE SURGERY  2011   RLE (03/11/2012)  . PERCUTANEOUS CORONARY STENT INTERVENTION (PCI-S) N/A 03/11/2012   Procedure: PERCUTANEOUS CORONARY STENT INTERVENTION (PCI-S);  Surgeon: Sherren Mocha, MD;  Location[facility] C[facility] LAB;  Service: Cardiovascular;  Laterality: N/A;  . SKIN CANCER EXCISION  2013   "nose & right  middle finger; actinic keratosis" (03/11/2012)  . TONSILLECTOMY AND ADENOIDECTOMY  ~ 1962  . TUBAL LIGATION  1977  . VIDEO BRONCHOSCOPY WITH ENDOBRONCHIAL ULTRASOUND N/A 07/04/2017   Procedure: VIDEO BRONCHOSCOPY WITH ENDOBRONCHIAL ULTRASOUND;  Surgeon: Collene Gobble, MD;  Location: [facility] OR;  Service: Thoracic;  Laterality: N/A;    Family History  Problem Relation Age of Onset  . Colon cancer Brother 84  . Lung cancer Sister   . Lung cancer Brother   . Stomach cancer Brother   . Diabetes Daughter     Social History Social History   Tobacco Use  . Smoking status: Current Every Day Smoker    Packs/day: 1.00    Years: 51.00    Pack years: 51.00    Types: Cigarettes  . Smokeless tobacco: Never Used  Substance Use Topics  . Alcohol use: No  . Drug use: No    Allergies  Allergen Reactions  . Codeine Other (See Comments)    "gives me a migraine" (03/11/2012)  . Morphine And Related Other (See Comments)    "gives me a migraine" (03/11/2012)    Current Outpatient Medications  Medication Sig Dispense Refill  . acetaminophen (TYLENOL) 325 MG tablet Take 2 tablets (650 mg total) by mouth every 6 (six) hours as needed for mild pain (or Fever >/= 101). 20 tablet 0  . ALPRAZolam (XANAX) 0.5 MG tablet Take 1 tablet (0.5 mg total) by mouth at bedtime as needed for anxiety or sleep. 30 tablet 0  . amLODipine (NORVASC) 5 MG tablet Take 1 tablet (5 mg total) by mouth daily. 30 tablet 1  . aspirin 81 MG tablet Take 1 tablet (81 mg total) by mouth daily. 30 tablet 1  . atorvastatin (LIPITOR) 80 MG tablet Take 80 mg by mouth daily.    . clopidogrel (PLAVIX) 75 MG tablet Take 75 mg by mouth daily.    . diclofenac sodium (VOLTAREN) 1 % GEL Apply 1 application topically 4 (four) times daily. 100 g 0  . dronabinol (MARINOL) 5 MG capsule Take 1 capsule (5 mg total) by mouth 2 (two) times daily before lunch and supper. 60 capsule 1  . escitalopram (LEXAPRO) 10 MG tablet Take 10 mg by mouth daily.       . feeding supplement, ENSURE ENLIVE, (ENSURE ENLIVE) LIQD Take 237 mLs by mouth 2 (two) times daily between meals. 237 mL 12  . guaiFENesin (MUCINEX) 600 MG 12 hr tablet Take 1 tablet (600 mg total) by mouth 2 (two) times daily. 20 tablet 1  . HYDROcodone-acetaminophen (NORCO) 5-325 MG tablet Take 1 tablet by mouth every 6 (six) hours as needed for moderate pain. 15 tablet 0  . insulin aspart (NOVOLOG) 100 UNIT/ML injection Inject 0-9 Units into the skin 3 (three) times daily with meals. 10 mL 11  . insulin glargine (LANTUS) 100 UNIT/ML injection Inject 0.08 mLs (8 Units total) into the skin at bedtime. 10 mL 11  . JANUVIA 100 MG tablet Take 100 mg by mouth daily.    Marland Kitchen  metFORMIN (GLUCOPHAGE) 1000 MG tablet Take 1,000 mg by mouth 2 (two) times daily with a meal.    . metoprolol succinate (TOPROL-XL) 50 MG 24 hr tablet Take 50 mg by mouth daily. Take with or immediately following a meal.    . nicotine (NICODERM CQ - DOSED IN MG/24 HOURS) 14 mg/24hr patch Place 1 patch (14 mg total) onto the skin daily. 30 patch 1  . nitroGLYCERIN (NITROSTAT) 0.4 MG SL tablet Place 1 tablet (0.4 mg total) under the tongue every 5 (five) minutes as needed (up to 3 doses). For chest pain    . ondansetron (ZOFRAN ODT) 4 MG disintegrating tablet Take 1 tablet (4 mg total) by mouth every 8 (eight) hours as needed for nausea or vomiting. 20 tablet 0  . ondansetron (ZOFRAN) 4 MG tablet Take 1 tablet (4 mg total) by mouth every 8 (eight) hours as needed for nausea or vomiting. (Patient not taking: Reported on 06/30/2017) 20 tablet 0  . polyethylene glycol (MIRALAX / GLYCOLAX) packet Take 17 g by mouth daily. 14 each 0   No current facility-administered medications for this visit.     Review of Systems  Constitutional: positive for fatigue and weight loss Eyes: negative Ears, nose, mouth, throat, and face: negative Respiratory: positive for cough and sputum Cardiovascular: negative Gastrointestinal:  negative Genitourinary:negative Integument/breast: negative Hematologic/lymphatic: negative Musculoskeletal:negative Neurological: negative Behavioral/Psych: negative Endocrine: negative Allergic/Immunologic: negative  Physical Exam  UXL:KGMWN, healthy, no distress, ill looking and malnourished SKIN: skin color, texture, turgor are normal, no rashes or significant lesions HEAD: Normocephalic, No masses, lesions, tenderness or abnormalities EYES: normal, PERRLA, Conjunctiva are pink and non-injected EARS: External ears normal, Canals clear OROPHARYNX:no exudate, no erythema and lips, buccal mucosa, and tongue normal  NECK: supple, no adenopathy, no JVD LYMPH:  no palpable lymphadenopathy, no hepatosplenomegaly BREAST:not examined LUNGS: clear to auscultation , and palpation HEART: regular rate & rhythm, no murmurs and no gallops ABDOMEN:abdomen soft, non-tender, normal bowel sounds and no masses or organomegaly BACK: No CVA tenderness, Range of motion is normal EXTREMITIES:no joint deformities, effusion, or inflammation, no edema  NEURO: alert & oriented x 3 with fluent speech, no focal motor/sensory deficits  PERFORMANCE STATUS: ECOG 1  LABORATORY DATA: Lab Results  Component Value Date   WBC 11.6 (H) 07/11/2017   HGB 10.3 (L) 07/08/2017   HCT 33.7 (L) 07/11/2017   MCV 90.8 07/11/2017   PLT 391 07/11/2017      Chemistry      Component Value Date/Time   NA 135 (L) 07/11/2017 0905   K 4.9 07/11/2017 0905   CL 99 07/11/2017 0905   CO2 29 07/11/2017 0905   BUN 29 (H) 07/11/2017 0905   CREATININE 0.84 07/11/2017 0905      Component Value Date/Time   CALCIUM 9.5 07/11/2017 0905   ALKPHOS 123 07/11/2017 0905   AST 23 07/11/2017 0905   ALT 33 07/11/2017 0905   BILITOT 0.2 07/11/2017 0905       RADIOGRAPHIC STUDIES: Dg Chest 2 View  Result Date: 06/30/2017 CLINICAL DATA:  Cough weakness vomiting and diarrhea EXAM: CHEST - 2 VIEW COMPARISON:  09/10/2014 FINDINGS:  Hyperinflation. Ill-defined left upper lobe peripheral and parenchymal disease. Ill-defined opacity in the right upper lobe, possibly with cavitation. Normal heart size with aortic atherosclerosis. No pneumothorax. Degenerative changes of the spine. Asymmetric fullness of the left hilum. IMPRESSION: Hyperinflation with ill-defined upper lobe pulmonary opacity with mild pleural changes in the left upper lobe and possible cavitation of the lesion  in the right upper lobe. Asymmetric fullness of the left hilum which may reflect nodes. CT chest recommended for further evaluation. Electronically Signed   By: Donavan Foil M.D.   On: 06/30/2017 18:20   Ct Chest W Contrast  Result Date: 06/30/2017 CLINICAL DATA:  77 year old female with cough and congestion. EXAM: CT CHEST WITH CONTRAST TECHNIQUE: Multidetector CT imaging of the chest was performed during intravenous contrast administration. CONTRAST:  72 milliliters ISOVUE-300 IOPAMIDOL (ISOVUE-300) INJECTION 61% COMPARISON:  Chest radiographs 1813 hours today and earlier. Chest CTA 02/09/2003 FINDINGS: Cardiovascular: Extensive calcified coronary artery atherosclerosis and/or stents. Less severe Calcified aortic atherosclerosis. No cardiomegaly. No pericardial effusion. The other major mediastinal vascular structures appear patent, although there is extrinsic mass effect on the left pulmonary artery branches, see below. Mediastinum/Nodes: Left hilar mass versus conglomerate metastatic nodal disease measuring up to 17 mm in thickness and encasing the left upper lobe airway (series 3, image 66). Bulky AP window lymphadenopathy measuring 18 mm short axis. Other mediastinal nodal stations and the right hilar lymph nodes appear more normal. Lungs/Pleura: Pulmonary hyperinflation. Lobulated and spiculated up to 3.7 centimeter diameter masslike opacity along the left peripheral lung at the level of the hilum (series 3, image 50 and sagittal image 118) with no air  bronchograms. Surrounding spiculation and septal thickening. Abnormal peribronchial soft tissue like nodularity tracking to the hilum as seen on series 5, image 60. These are in the left upper lobe. Superimposed mild peribronchial ground-glass nodularity in the left lower lobe. No left pleural effusion. In the right lung there is lateral upper lobe, lateral segment right middle lobe and right lower lobe peribronchial ground-glass nodularity. No right pleural effusion. Upper Abdomen: Negative visible liver, spleen, left adrenal gland, right adrenal gland, right kidney, and pancreas in the upper abdomen. Left kidney remarkable for calcified vascular disease or nephrolithiasis. There is air in fluid distending the stomach although there is superimposed gastric mucosal hyperenhancement (series 3, image 127 which is not particularly masslike. Musculoskeletal: Osteopenia. No rib destruction associated with the left lateral lung mass which borders the left lateral 4th and 5th ribs. No acute or suspicious osseous lesion identified in the chest. IMPRESSION: 1. 3.7 cm spiculated mass in the lateral periphery of the left upper lobe at the level of the hilum is Bronchogenic Carcinoma until proven otherwise. Associated with abnormal peribronchial soft tissue nodularity tracking to the left hilum and bulky, malignant appearing left hilar and AP window lymphadenopathy. 2. Underlying pulmonary hyperinflation and superimposed multilobar bilateral peribronchial ground-glass nodularity compatible with a Superimposed Acute Viral/atypical Respiratory Infection. No pleural effusion. 3. Nonspecific gastric fundal mucosal hyperenhancement, perhaps acute gastritis. 4. Coronary artery and Aortic Atherosclerosis (ICD10-I70.0). Electronically Signed   By: Genevie Ann M.D.   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  She presented with left upper lobe lung mass in addition to left hilar and mediastinal lymphadenopathy.   PLAN: I had a lengthy discussion with the patient and her family today about her current disease stage, prognosis and treatment options.  I personally and independently reviewed the scan images and showed the results to the patient and her family. I recommended for the patient to complete the staging work-up by ordering a PET scan as well as MRI of the brain to rule out metastatic disease. I discussed with the patient  her treatment options and recommended for her systemic chemotherapy with carboplatin for AUC of 5 on day 1 and etoposide 100 mg/M2 on days 1, 2 and 3 every 3 weeks.  This will be concurrent with radiotherapy if the patient has no evidence of metastatic disease outside the known lesions. I discussed with the patient and her family the adverse effect of this treatment including but not limited to alopecia, myelosuppression, nausea and vomiting, peripheral neuropathy, liver or renal dysfunction.  She would like to proceed with the treatment as planned. She is expected to start the first dose of this treatment on Monday, July 21, 2017. I will refer the patient to radiation oncology for evaluation and discussion of the radiotherapy option. For the weight loss and malnutrition, I will arrange for the patient to see the dietitian at the cancer center for nutritional evaluation and management. For Cole cessation, I strongly encouraged the patient to quit smoking. For IV access, I will arrange for the patient to have Port-A-Cath placed before starting the first dose of her treatment. I will call her pharmacy with prescription for EMLA cream to be applied to the Port-A-Cath site before treatment. The patient will come back for follow-up visit in 2 weeks for reevaluation and management of any adverse effect of her treatment. She was advised to call  immediately if she has any concerning symptoms in the interval. The patient and her family are in agreement with the current plan. The patient voices understanding of current disease status and treatment options and is in agreement with the current care plan.  All questions were answered. The patient knows to call the clinic with any problems, questions or concerns. We can certainly see the patient much sooner if necessary.  Thank you so much for allowing me to participate in the care of SHANAH GUIMARAES. I will continue to follow up the patient with you and assist in her care.  I spent 55 minutes counseling the patient face to face. The total time spent in the appointment was 80 minutes.  Disclaimer: This note was dictated with voice recognition software. Similar sounding words can inadvertently be transcribed and may not be corrected upon review.   [name] July 11, 2017, 10:02 AM

## 2017-07-11 NOTE — Progress Notes (Signed)
START ON PATHWAY REGIMEN - Small Cell Lung     A cycle is every 21 days:     Etoposide      Carboplatin   **Always confirm dose/schedule in your pharmacy ordering system**    Patient Characteristics: Limited Stage, First Line Stage Classification: Limited AJCC T Category: T2a AJCC N Category: N2 AJCC M Category: M0 AJCC 8 Stage Grouping: IIIA Line of therapy: First Line  Intent of Therapy: Curative Intent, Discussed with Patient

## 2017-07-11 NOTE — Progress Notes (Signed)
Oncology Nurse Navigator Documentation  Oncology Nurse Navigator Flowsheets 07/11/2017  Navigator Location [facility]  Navigator Encounter Type Clinic/MDC;Initial MedOnc/spoke with patient and family today.  Katherine Cole is a newly DX small cell lung cancer. I gave and explained information on lung cancer, treatment, and smoking cessation.  Per Dr. Worthy Flank orders, patient is now scheduled for MRI brain, PET, consultation with Rad Onc, chemo ed and 1st chemo.    Abnormal Finding Date 06/30/2017  Patient Visit Type MedOnc  Treatment Phase Pre-Tx/Tx Discussion  Barriers/Navigation Needs Education  Education Other;Newly Diagnosed Cancer Education;Understanding Cancer/ Treatment Options  Interventions Education  Education Method Verbal;Written  Acuity Level 2  Time Spent with Patient 30

## 2017-07-12 ENCOUNTER — Encounter: Payer: Self-pay | Admitting: Internal Medicine

## 2017-07-12 DIAGNOSIS — E46 Unspecified protein-calorie malnutrition: Secondary | ICD-10-CM | POA: Insufficient documentation

## 2017-07-12 DIAGNOSIS — C7931 Secondary malignant neoplasm of brain: Secondary | ICD-10-CM

## 2017-07-12 DIAGNOSIS — Z7189 Other specified counseling: Secondary | ICD-10-CM | POA: Insufficient documentation

## 2017-07-12 DIAGNOSIS — C3490 Malignant neoplasm of unspecified part of unspecified bronchus or lung: Secondary | ICD-10-CM | POA: Insufficient documentation

## 2017-07-12 DIAGNOSIS — Z716 Tobacco abuse counseling: Secondary | ICD-10-CM | POA: Insufficient documentation

## 2017-07-12 DIAGNOSIS — Z5111 Encounter for antineoplastic chemotherapy: Secondary | ICD-10-CM | POA: Insufficient documentation

## 2017-07-14 ENCOUNTER — Telehealth: Payer: Self-pay | Admitting: *Deleted

## 2017-07-14 ENCOUNTER — Telehealth: Payer: Self-pay | Admitting: Medical Oncology

## 2017-07-14 NOTE — Telephone Encounter (Signed)
Oncology Nurse Navigator Documentation  Oncology Nurse Navigator Flowsheets 07/14/2017  Navigator Location [facility]  Navigator Encounter Type Telephone  Telephone Outgoing Call/patient's daughter called. She states her mom has diarrhea and wanted to update me.  I called and spoke to her. She explained about patient's diarrhea. I updated her on who to call for this. Patient's daughter was also having trouble getting port appt. I called IR and updated them.  They will call patient's daughter with an appt.   Patient Visit Type MedOnc  Treatment Phase Treatment  Barriers/Navigation Needs Coordination of Care;Education  Education Other  Interventions Education;Coordination of Care  Coordination of Care Other  Education Method Verbal  Acuity Level 2  Time Spent with Patient 30

## 2017-07-14 NOTE — Telephone Encounter (Signed)
LVM to call to see about availability for port placement prior to April 16th .

## 2017-07-15 ENCOUNTER — Ambulatory Visit (HOSPITAL_COMMUNITY)
Admission: RE | Admit: 2017-07-15 | Discharge: 2017-07-15 | Disposition: A | Discharge status: Home / Self Care | Payer: Medicare Other | Source: Ambulatory Visit | Attending: Internal Medicine | Admitting: Internal Medicine

## 2017-07-15 ENCOUNTER — Telehealth: Payer: Self-pay

## 2017-07-15 DIAGNOSIS — C3492 Malignant neoplasm of unspecified part of left bronchus or lung: Secondary | ICD-10-CM | POA: Insufficient documentation

## 2017-07-15 DIAGNOSIS — I6782 Cerebral ischemia: Secondary | ICD-10-CM | POA: Insufficient documentation

## 2017-07-15 MED ORDER — GADOBENATE DIMEGLUMINE 529 MG/ML IV SOLN
10.0000 mL | Freq: Once | INTRAVENOUS | Status: AC | PRN
  Administered 2017-07-15: 7 mL via INTRAVENOUS

## 2017-07-15 NOTE — Telephone Encounter (Signed)
Returned pt daughters call regarding patient having issues with diarrhea. Per her daughter is it typically only after she eats and she is on a mostly liquid diet consisting of boosts drinks. Due to this information I informed her with the patient being on a liquid diet it can cause her to have loose stools. Informed her she can take over the counter Imodium as its prescribed on the bottle. If the diarrhea continues or becomes more often they are to call us back.  Cyndia Bent RN

## 2017-07-16 ENCOUNTER — Ambulatory Visit (HOSPITAL_COMMUNITY): Payer: Medicare Other

## 2017-07-16 ENCOUNTER — Other Ambulatory Visit: Payer: Self-pay | Admitting: Radiology

## 2017-07-17 ENCOUNTER — Other Ambulatory Visit: Payer: Self-pay | Admitting: Radiology

## 2017-07-17 ENCOUNTER — Telehealth: Payer: Self-pay | Admitting: *Deleted

## 2017-07-17 ENCOUNTER — Inpatient Hospital Stay: Payer: Medicare Other | Admitting: Nutrition

## 2017-07-17 ENCOUNTER — Ambulatory Visit
Admission: RE | Admit: 2017-07-17 | Discharge: 2017-07-17 | Disposition: A | Discharge status: Home / Self Care | Payer: Medicare Other | Source: Ambulatory Visit | Attending: Internal Medicine | Admitting: Internal Medicine

## 2017-07-17 ENCOUNTER — Inpatient Hospital Stay: Payer: Medicare Other

## 2017-07-17 DIAGNOSIS — C7951 Secondary malignant neoplasm of bone: Secondary | ICD-10-CM | POA: Insufficient documentation

## 2017-07-17 DIAGNOSIS — I77811 Abdominal aortic ectasia: Secondary | ICD-10-CM | POA: Insufficient documentation

## 2017-07-17 DIAGNOSIS — I700 Atherosclerosis of aorta: Secondary | ICD-10-CM | POA: Insufficient documentation

## 2017-07-17 DIAGNOSIS — C3492 Malignant neoplasm of unspecified part of left bronchus or lung: Secondary | ICD-10-CM | POA: Diagnosis present

## 2017-07-17 DIAGNOSIS — I2510 Atherosclerotic heart disease of native coronary artery without angina pectoris: Secondary | ICD-10-CM | POA: Insufficient documentation

## 2017-07-17 DIAGNOSIS — C771 Secondary and unspecified malignant neoplasm of intrathoracic lymph nodes: Secondary | ICD-10-CM | POA: Insufficient documentation

## 2017-07-17 DIAGNOSIS — C787 Secondary malignant neoplasm of liver and intrahepatic bile duct: Secondary | ICD-10-CM | POA: Insufficient documentation

## 2017-07-17 LAB — GLUCOSE, CAPILLARY
GLUCOSE-CAPILLARY: 73 mg/dL (ref 65–99)
Glucose-Capillary: 73 mg/dL (ref 65–99)

## 2017-07-17 MED ORDER — FLUDEOXYGLUCOSE F - 18 (FDG) INJECTION
5.8100 | Freq: Once | INTRAVENOUS | Status: AC | PRN
  Administered 2017-07-17: 5.81 via INTRAVENOUS

## 2017-07-17 NOTE — Telephone Encounter (Signed)
Oncology Nurse Navigator Documentation  Oncology Nurse Navigator Flowsheets 07/17/2017  Navigator Location [facility]  Navigator Encounter Type Telephone;Lobby  Telephone Outgoing Call/I called and spoke with daughter. She states that she was in the lobby.  I went to speak with her and patient. Per Dr. Julien Nordmann I updated them on scans. They were thankful for the update.   Treatment Phase Treatment  Barriers/Navigation Needs Education  Education Other  Interventions Education  Education Method Verbal  Acuity Level 2  Time Spent with Patient 30

## 2017-07-17 NOTE — Progress Notes (Signed)
77 year old female diagnosed with small cell lung cancer.   She is a patient of Dr. Julien Nordmann.  Past medical history includes stroke, hypertension, hyperlipidemia, GERD, diabetes type 2, depression, and CAD.  Medications include Xanax, Lipitor, Marinol, Lexapro, NovoLog, Lantus, Januvia, Glucophage, Zofran, and MiraLAX.  Labs include sodium 135, glucose 219, and albumin 2.7.  Height: 61 inches. Weight: 82.5 pounds. Usual body weight: 100 pounds in 2017. BMI: 15.59.  Patient reports she has a poor appetite.   Patient endorses weight loss of approximately 18 pounds. She has had episodes of diarrhea. She is drinking high protein boost, two daily. Daughter very concerned regarding extreme weight loss.  Nutrition diagnosis:  Inadequate oral intake related to small cell lung cancer as evidenced by 17% weight loss from usual body weight.  Intervention: Educated patient on the importance of eating more often and trying to consume higher calorie, higher protein foods. Recommended patient try boost plus to provide additional calories. I provided samples and coupons. Recommended patient try to drink oral nutrition supplements at least 3 times daily between meals. Reviewed high-calorie, high-protein foods and provided fact sheets. Questions were answered, teach back method used.  I also provided patient with contact information.  Monitoring, evaluation, goals: Patient will tolerate increased calories and protein to minimize further weight loss.  Next visit: Tuesday, May 7, during infusion.  **Disclaimer: This note was dictated with voice recognition software. Similar sounding words can inadvertently be transcribed and this note may contain transcription errors which may not have been corrected upon publication of note.**

## 2017-07-18 ENCOUNTER — Other Ambulatory Visit: Payer: Self-pay | Admitting: Internal Medicine

## 2017-07-18 ENCOUNTER — Ambulatory Visit (HOSPITAL_COMMUNITY)
Admission: RE | Admit: 2017-07-18 | Discharge: 2017-07-18 | Disposition: A | Discharge status: Home / Self Care | Payer: Medicare Other | Source: Ambulatory Visit | Attending: Internal Medicine | Admitting: Internal Medicine

## 2017-07-18 ENCOUNTER — Encounter (HOSPITAL_COMMUNITY): Payer: Self-pay

## 2017-07-18 DIAGNOSIS — Z794 Long term (current) use of insulin: Secondary | ICD-10-CM | POA: Insufficient documentation

## 2017-07-18 DIAGNOSIS — Z885 Allergy status to narcotic agent status: Secondary | ICD-10-CM | POA: Insufficient documentation

## 2017-07-18 DIAGNOSIS — K219 Gastro-esophageal reflux disease without esophagitis: Secondary | ICD-10-CM | POA: Diagnosis not present

## 2017-07-18 DIAGNOSIS — F17210 Nicotine dependence, cigarettes, uncomplicated: Secondary | ICD-10-CM | POA: Insufficient documentation

## 2017-07-18 DIAGNOSIS — Z955 Presence of coronary angioplasty implant and graft: Secondary | ICD-10-CM | POA: Insufficient documentation

## 2017-07-18 DIAGNOSIS — E119 Type 2 diabetes mellitus without complications: Secondary | ICD-10-CM | POA: Insufficient documentation

## 2017-07-18 DIAGNOSIS — I252 Old myocardial infarction: Secondary | ICD-10-CM | POA: Diagnosis not present

## 2017-07-18 DIAGNOSIS — C3492 Malignant neoplasm of unspecified part of left bronchus or lung: Secondary | ICD-10-CM | POA: Insufficient documentation

## 2017-07-18 DIAGNOSIS — I2510 Atherosclerotic heart disease of native coronary artery without angina pectoris: Secondary | ICD-10-CM | POA: Diagnosis not present

## 2017-07-18 DIAGNOSIS — Z7902 Long term (current) use of antithrombotics/antiplatelets: Secondary | ICD-10-CM | POA: Diagnosis not present

## 2017-07-18 DIAGNOSIS — I10 Essential (primary) hypertension: Secondary | ICD-10-CM | POA: Diagnosis not present

## 2017-07-18 DIAGNOSIS — Z8673 Personal history of transient ischemic attack (TIA), and cerebral infarction without residual deficits: Secondary | ICD-10-CM | POA: Diagnosis not present

## 2017-07-18 DIAGNOSIS — Z7982 Long term (current) use of aspirin: Secondary | ICD-10-CM | POA: Diagnosis not present

## 2017-07-18 DIAGNOSIS — E785 Hyperlipidemia, unspecified: Secondary | ICD-10-CM | POA: Insufficient documentation

## 2017-07-18 DIAGNOSIS — M19049 Primary osteoarthritis, unspecified hand: Secondary | ICD-10-CM | POA: Diagnosis not present

## 2017-07-18 DIAGNOSIS — F329 Major depressive disorder, single episode, unspecified: Secondary | ICD-10-CM | POA: Diagnosis not present

## 2017-07-18 HISTORY — PX: IR US GUIDE VASC ACCESS RIGHT: IMG2390

## 2017-07-18 HISTORY — PX: IR FLUORO GUIDE PORT INSERTION RIGHT: IMG5741

## 2017-07-18 LAB — BASIC METABOLIC PANEL
Anion gap: 10 (ref 5–15)
BUN: 12 mg/dL (ref 6–20)
CHLORIDE: 98 mmol/L — AB (ref 101–111)
CO2: 26 mmol/L (ref 22–32)
CREATININE: 0.74 mg/dL (ref 0.44–1.00)
Calcium: 9.1 mg/dL (ref 8.9–10.3)
GFR calc Af Amer: >60 mL/min (ref >60)
GFR calc non Af Amer: >60 mL/min (ref >60)
Glucose, Bld: 174 mg/dL — ABNORMAL HIGH (ref 65–99)
Potassium: 4.2 mmol/L (ref 3.5–5.1)
SODIUM: 134 mmol/L — AB (ref 135–145)

## 2017-07-18 LAB — CBC
HEMATOCRIT: 32.5 % — AB (ref 36.0–46.0)
Hemoglobin: 10.7 g/dL — ABNORMAL LOW (ref 12.0–15.0)
MCH: 29.1 pg (ref 26.0–34.0)
MCHC: 32.9 g/dL (ref 30.0–36.0)
MCV: 88.3 fL (ref 78.0–100.0)
PLATELETS: 355 10*3/uL (ref 150–400)
RBC: 3.68 MIL/uL — ABNORMAL LOW (ref 3.87–5.11)
RDW: 14.6 % (ref 11.5–15.5)
WBC: 7.2 10*3/uL (ref 4.0–10.5)

## 2017-07-18 LAB — PROTIME-INR
INR: 0.92
Prothrombin Time: 12.2 seconds (ref 11.4–15.2)

## 2017-07-18 LAB — GLUCOSE, CAPILLARY: Glucose-Capillary: 170 mg/dL — ABNORMAL HIGH (ref 65–99)

## 2017-07-18 MED ORDER — MIDAZOLAM HCL 2 MG/2ML IJ SOLN
INTRAMUSCULAR | Status: AC
  Filled 2017-07-18: qty 4

## 2017-07-18 MED ORDER — HEPARIN SOD (PORK) LOCK FLUSH 100 UNIT/ML IV SOLN
INTRAVENOUS | Status: AC
  Administered 2017-07-18: 10:00:00
  Filled 2017-07-18: qty 5

## 2017-07-18 MED ORDER — MIDAZOLAM HCL 2 MG/2ML IJ SOLN
INTRAMUSCULAR | Status: AC | PRN
  Administered 2017-07-18: 1 mg via INTRAVENOUS
  Administered 2017-07-18 (×2): 0.5 mg via INTRAVENOUS

## 2017-07-18 MED ORDER — LIDOCAINE HCL (PF) 1 % IJ SOLN
INTRAMUSCULAR | Status: AC | PRN
  Administered 2017-07-18: 15 mL

## 2017-07-18 MED ORDER — SODIUM CHLORIDE 0.9 % IV SOLN
INTRAVENOUS | Status: DC
  Administered 2017-07-18: 10:00:00 via INTRAVENOUS

## 2017-07-18 MED ORDER — CEFAZOLIN SODIUM-DEXTROSE 2-4 GM/100ML-% IV SOLN
INTRAVENOUS | Status: AC
  Filled 2017-07-18: qty 100

## 2017-07-18 MED ORDER — LIDOCAINE HCL 1 % IJ SOLN
INTRAMUSCULAR | Status: AC
  Filled 2017-07-18: qty 20

## 2017-07-18 MED ORDER — LIDOCAINE-EPINEPHRINE (PF) 1 %-1:200000 IJ SOLN
INTRAMUSCULAR | Status: AC
  Filled 2017-07-18: qty 30

## 2017-07-18 MED ORDER — FENTANYL CITRATE (PF) 100 MCG/2ML IJ SOLN
INTRAMUSCULAR | Status: AC | PRN
  Administered 2017-07-18 (×2): 25 ug via INTRAVENOUS
  Administered 2017-07-18: 50 ug via INTRAVENOUS

## 2017-07-18 MED ORDER — CEFAZOLIN SODIUM-DEXTROSE 2-4 GM/100ML-% IV SOLN
2.0000 g | INTRAVENOUS | Status: AC
  Administered 2017-07-18: 2 g via INTRAVENOUS

## 2017-07-18 MED ORDER — FENTANYL CITRATE (PF) 100 MCG/2ML IJ SOLN
INTRAMUSCULAR | Status: AC
  Filled 2017-07-18: qty 4

## 2017-07-18 NOTE — Procedures (Signed)
Interventional Radiology Procedure Note  Procedure: Placement of a right IJ approach single lumen PowerPort.  Tip is positioned at the superior cavoatrial junction and catheter is ready for immediate use.  Complications: None Recommendations:  - Ok to shower tomorrow - Do not submerge for 7 days - Routine line care   Signed,  Malissia Rabbani S. Itxel Wickard, DO   

## 2017-07-18 NOTE — Sedation Documentation (Signed)
Patient is resting comfortably. 

## 2017-07-18 NOTE — H&P (Signed)
Chief Complaint: Patient was seen in consultation today for [facility] a Cath placement at the request of [facility]  Referring Physician(s): Mohamed,Mohamed  Supervising Physician: Corrie Mckusick  Patient Status: [facility] - Out-pt  History of Present Illness: Katherine Cole is a 77 y.o. 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 Arthritis    "in my fingers"   . CAD (coronary artery disease)    a. MI 1999 with 2 stents at that time followed by stenting 3-4 yrs later. b. s/p DES to LAD guided by pressure wire 03/11/12.  . Colon polyps    adenomatous  . Depression   . Diabetes mellitus, type 2 ([place])   . GERD (gastroesophageal reflux disease)   . Hyperlipemia   . Hypertension   . Osteoarthritis   . Pneumonia 06/2017  . Skin cancer 2012   "nose; right middle finger"  . Stroke [facility])     Past Surgical History:  Procedure Laterality Date  . APPENDECTOMY  ~ 1965  . CATARACT EXTRACTION    . CORONARY ANGIOPLASTY WITH STENT PLACEMENT  1999; 2004; 03/11/2012   "2 + 1 + 1; total of 4" (03/11/2012)  . FEMUR FRACTURE SURGERY  2011   RLE (03/11/2012)  . PERCUTANEOUS CORONARY STENT INTERVENTION (PCI-S) N/A 03/11/2012   Procedure: PERCUTANEOUS CORONARY STENT INTERVENTION (PCI-S);  Surgeon: Sherren Mocha, MD;  Location: [facility] CATH LAB;  Service: Cardiovascular;  Laterality: N/A;  . SKIN CANCER EXCISION  2013   "nose & right middle finger; actinic keratosis" (03/11/2012)  . TONSILLECTOMY AND ADENOIDECTOMY  ~ 1962  . TUBAL LIGATION  1977  . VIDEO BRONCHOSCOPY WITH ENDOBRONCHIAL ULTRASOUND N/A 07/04/2017   Procedure: VIDEO BRONCHOSCOPY WITH ENDOBRONCHIAL ULTRASOUND;  Surgeon: Collene Gobble, MD;  Location: [facility] OR;  Service: Thoracic;  Laterality: N/A;    Allergies: Codeine and Morphine and related  Medications: Prior to  Admission medications   Medication Sig Start Date End Date Taking? Authorizing Provider  ALPRAZolam Duanne Moron) 0.5 MG tablet Take 1 tablet (0.5 mg total) by mouth at bedtime as needed for anxiety or sleep. 07/08/17 07/08/18 Yes Emokpae, Courage, MD  aspirin 81 MG tablet Take 1 tablet (81 mg total) by mouth daily. 01/21/14  Yes Annita Brod, MD  acetaminophen (TYLENOL) 325 MG tablet Take 2 tablets (650 mg total) by mouth every 6 (six) hours as needed for mild pain (or Fever >/= 101). 07/08/17   Emokpae, Courage, MD  amLODipine (NORVASC) 5 MG tablet Take 1 tablet (5 mg total) by mouth daily. 07/09/17   Roxan Hockey, MD  atorvastatin (LIPITOR) 80 MG tablet Take 80 mg by mouth daily.    [provider]  clopidogrel (PLAVIX) 75 MG tablet Take 75 mg by mouth daily.    [provider]  guaiFENesin (MUCINEX) 600 MG 12 hr tablet Take 1 tablet (600 mg total) by mouth 2 (two) times daily. 07/08/17   Roxan Hockey, MD  HYDROcodone-acetaminophen (NORCO) 5-325 MG tablet Take 1 tablet by mouth every 6 (six) hours as needed for moderate pain. 07/08/17   Roxan Hockey, MD  insulin glargine (LANTUS) 100 UNIT/ML injection Inject 0.08 mLs (8 Units total) into the skin at bedtime. 07/08/17   Emokpae, Courage, MD  JANUVIA 100 MG tablet Take 100 mg by mouth daily. 11/03/13   [provider]  lidocaine-prilocaine (EMLA)  cream Apply 1 application topically as needed. 07/11/17   Curt Bears, MD  metFORMIN (GLUCOPHAGE) 1000 MG tablet Take 1,000 mg by mouth 2 (two) times daily with a meal.    [provider]  metoprolol succinate (TOPROL-XL) 50 MG 24 hr tablet Take 50 mg by mouth daily. Take with or immediately following a meal.    [provider]  nitroGLYCERIN (NITROSTAT) 0.4 MG SL tablet Place 1 tablet (0.4 mg total) under the tongue every 5 (five) minutes as needed (up to 3 doses). For chest pain 03/12/12   Dunn, Lisbeth Renshaw N, PA-C  ondansetron (ZOFRAN ODT) 4 MG disintegrating tablet  Take 1 tablet (4 mg total) by mouth every 8 (eight) hours as needed for nausea or vomiting. 07/08/17   Roxan Hockey, MD  ondansetron (ZOFRAN) 4 MG tablet Take 1 tablet (4 mg total) by mouth every 8 (eight) hours as needed for nausea or vomiting. Patient not taking: Reported on 06/30/2017 09/10/14   Melony Overly, MD     Family History  Problem Relation Age of Onset  . Colon cancer Brother 62  . Lung cancer Sister   . Lung cancer Brother   . Stomach cancer Brother   . Diabetes Daughter     Social History   Socioeconomic History  . Marital status: Married    Spouse name: Not on file  . Number of children: 3  . Years of education: Not on file  . Highest education level: Not on file  Occupational History  . Occupation: Retired  Scientific laboratory technician  . Financial resource strain: Not on file  . Food insecurity:    Worry: Not on file    Inability: Not on file  . Transportation needs:    Medical: Not on file    Non-medical: Not on file  Tobacco Use  . Smoking status: Current Every Day Smoker    Packs/day: 1.00    Years: 51.00    Pack years: 51.00    Types: Cigarettes  . Smokeless tobacco: Never Used  Substance and Sexual Activity  . Alcohol use: No  . Drug use: No  . Sexual activity: Never  Lifestyle  . Physical activity:    Days per week: Not on file    Minutes per session: Not on file  . Stress: Not on file  Relationships  . Social connections:    Talks on phone: Not on file    Gets together: Not on file    Attends religious service: Not on file    Active member of club or organization: Not on file    Attends meetings of clubs or organizations: Not on file    Relationship status: Not on file  Other Topics Concern  . Not on file  Social History Narrative   Grandson lives with her.      Review of Systems: A 12 point ROS discussed and pertinent positives are indicated in the HPI above.  All other systems are negative.  Review of Systems  Constitutional: Positive for  activity change, fatigue and unexpected weight change. Negative for fever.  Respiratory: Positive for cough and shortness of breath. Negative for wheezing.   Gastrointestinal: Negative for abdominal pain.  Neurological: Positive for dizziness and weakness.  Psychiatric/Behavioral: Negative for behavioral problems and confusion.    Vital Signs: BP (!) 111/97   Pulse 84   Temp 98.3 F (36.8 C) (Oral)   Ht 5\' 1"  (1.549 m)   Wt 82 lb (37.2 kg)   SpO2 99%  BMI 15.49 kg/m   Physical Exam  Constitutional: She is oriented to person, place, and time.  Cardiovascular: Normal rate, regular rhythm and normal heart sounds.  Pulmonary/Chest: Effort normal. She has wheezes.  Abdominal: Soft. Bowel sounds are normal.  Musculoskeletal: Normal range of motion.  Neurological: She is alert and oriented to person, place, and time.  Skin: Skin is warm and dry.  Psychiatric: She has a normal mood and affect. Her behavior is normal. Judgment and thought content normal.  Nursing note and vitals reviewed.   Imaging: Dg Chest 2 View  Result Date: 06/30/2017 CLINICAL DATA:  Cough weakness vomiting and diarrhea EXAM: CHEST - 2 VIEW COMPARISON:  09/10/2014 FINDINGS: Hyperinflation. Ill-defined left upper lobe peripheral and parenchymal disease. Ill-defined opacity in the right upper lobe, possibly with cavitation. Normal heart size with aortic atherosclerosis. No pneumothorax. Degenerative changes of the spine. Asymmetric fullness of the left hilum. IMPRESSION: Hyperinflation with ill-defined upper lobe pulmonary opacity with mild pleural changes in the left upper lobe and possible cavitation of the lesion in the right upper lobe. Asymmetric fullness of the left hilum which may reflect nodes. CT chest recommended for further evaluation. Electronically Signed   By: Donavan Foil M.D.   On: 06/30/2017 18:20   Ct Chest W Contrast  Result Date: 06/30/2017 CLINICAL DATA:  77 year old female with cough and  congestion. EXAM: CT CHEST WITH CONTRAST TECHNIQUE: Multidetector CT imaging of the chest was performed during intravenous contrast administration. CONTRAST:  72 milliliters ISOVUE-300 IOPAMIDOL (ISOVUE-300) INJECTION 61% COMPARISON:  Chest radiographs 1813 hours today and earlier. Chest CTA 02/09/2003 FINDINGS: Cardiovascular: Extensive calcified coronary artery atherosclerosis and/or stents. Less severe Calcified aortic atherosclerosis. No cardiomegaly. No pericardial effusion. The other major mediastinal vascular structures appear patent, although there is extrinsic mass effect on the left pulmonary artery branches, see below. Mediastinum/Nodes: Left hilar mass versus conglomerate metastatic nodal disease measuring up to 17 mm in thickness and encasing the left upper lobe airway (series 3, image 66). Bulky AP window lymphadenopathy measuring 18 mm short axis. Other mediastinal nodal stations and the right hilar lymph nodes appear more normal. Lungs/Pleura: Pulmonary hyperinflation. Lobulated and spiculated up to 3.7 centimeter diameter masslike opacity along the left peripheral lung at the level of the hilum (series 3, image 50 and sagittal image 118) with no air bronchograms. Surrounding spiculation and septal thickening. Abnormal peribronchial soft tissue like nodularity tracking to the hilum as seen on series 5, image 60. These are in the left upper lobe. Superimposed mild peribronchial ground-glass nodularity in the left lower lobe. No left pleural effusion. In the right lung there is lateral upper lobe, lateral segment right middle lobe and right lower lobe peribronchial ground-glass nodularity. No right pleural effusion. Upper Abdomen: Negative visible liver, spleen, left adrenal gland, right adrenal gland, right kidney, and pancreas in the upper abdomen. Left kidney remarkable for calcified vascular disease or nephrolithiasis. There is air in fluid distending the stomach although there is superimposed  gastric mucosal hyperenhancement (series 3, image 127 which is not particularly masslike. Musculoskeletal: Osteopenia. No rib destruction associated with the left lateral lung mass which borders the left lateral 4th and 5th ribs. No acute or suspicious osseous lesion identified in the chest. IMPRESSION: 1. 3.7 cm spiculated mass in the lateral periphery of the left upper lobe at the level of the hilum is Bronchogenic Carcinoma until proven otherwise. Associated with abnormal peribronchial soft tissue nodularity tracking to the left hilum and bulky, malignant appearing left hilar and AP window  lymphadenopathy. 2. Underlying pulmonary hyperinflation and superimposed multilobar bilateral peribronchial ground-glass nodularity compatible with a Superimposed Acute Viral/atypical Respiratory Infection. No pleural effusion. 3. Nonspecific gastric fundal mucosal hyperenhancement, perhaps acute gastritis. 4. Coronary artery and Aortic Atherosclerosis (ICD10-I70.0). Electronically Signed   By: Genevie Ann M.D.   On: 06/30/2017 22:38   Mr Jeri Cos QM Contrast  Result Date: 07/15/2017 CLINICAL DATA:  Small cell lung cancer. Tremors. Difficulty walking and dizziness. Staging. EXAM: MRI HEAD WITHOUT AND WITH CONTRAST TECHNIQUE: Multiplanar, multiecho pulse sequences of the brain and surrounding structures were obtained without and with intravenous contrast. CONTRAST:  10mL MULTIHANCE GADOBENATE DIMEGLUMINE 529 MG/ML IV SOLN COMPARISON:  None. FINDINGS: Brain: Diffusion imaging does not show any acute or subacute infarction. There chronic small-vessel ischemic changes of the pons. No focal cerebellar insult. Cerebral hemispheres show old small vessel infarctions of the thalami and basal ganglia and mild chronic small-vessel change of the hemispheric white matter. No cortical or large vessel territory infarction. No evidence of mass lesion, hemorrhage, hydrocephalus or extra-axial collection. No abnormal enhancement occurs. Vascular:  Major vessels at the base of the brain show flow. Skull and upper cervical spine: Negative Sinuses/Orbits: Clear/normal Other: None IMPRESSION: No evidence of metastatic disease. Chronic small-vessel ischemic changes throughout the brain as outlined above. Electronically Signed   By: Nelson Chimes M.D.   On: 07/15/2017 09:28   Nm Pet Image Initial (pi) Skull Base To Thigh  Result Date: 07/17/2017 CLINICAL DATA:  Initial treatment strategy for pulmonary nodule. EXAM: NUCLEAR MEDICINE PET SKULL BASE TO THIGH TECHNIQUE: 5.8 mCi F-18 FDG was injected intravenously. Full-ring PET imaging was performed from the skull base to thigh after the radiotracer. CT data was obtained and used for attenuation correction and anatomic localization. Fasting blood glucose: 73 mg/dl COMPARISON:  CT chest 06/30/2017 FINDINGS: Mediastinal blood pool activity: SUV max 1.6 NECK: No hypermetabolic lymph nodes in the neck. Incidental CT findings: None CHEST: Enlarged and hypermetabolic left-sided mediastinal and hilar lymph nodes. Index left AP window lymph node measures 1.7 cm and has an SUV max equal to 11.48. Left hilar nodal mass measures 4.4 cm and has an SUV max equal to 11.18. Within the periphery of the left upper lobe there is a subpleural mass measuring 4.2 cm within SUV max equal to 10.8. Incidental CT findings: Aortic atherosclerosis. Calcifications within the LAD, left circumflex and RCA coronary arteries. ABDOMEN/PELVIS: 1.4 cm lesion within segment 4 of the liver measures 1.4 cm and has an SUV max equal to 5.0. No additional hypermetabolic liver lesions. No abnormal uptake within the adrenal glands, pancreas or spleen. Incidental CT findings: Aortic atherosclerosis. Infrarenal abdominal aortic ectasia measures 2.8 cm, image 159/3. Nonobstructing left renal calculus measures 7 mm. SKELETON: Hypermetabolic lesions within the central sacrum has an SUV max equal to 5.79. A second hypermetabolic lesion within the posterior column  of the right acetabulum has an SUV max equal to 5.2. Incidental CT findings: No suspicious lytic or sclerotic bone lesions identified on the corresponding CT images. Chronic healed right inferior pubic rami fracture noted. IMPRESSION: 1. Subpleural mass within the left upper lobe is intensely hypermetabolic compatible with primary bronchogenic carcinoma. There is associated ipsilateral hilar and mediastinal nodal metastasis, liver metastasis and bone metastasis. 2.  Aortic Atherosclerosis (ICD10-I70.0). 3. 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 Recommend followup by ultrasound in 5 years. This recommendation follows ACR consensus guidelines: White Paper of the ACR Incidental Findings Committee II on Vascular Findings. J Am Coll Radiol 2013;  [id number]. 4. Three vessel coronary artery calcifications noted. Electronically Signed   By: Kerby Moors M.D.   On: 07/17/2017 14:49    Labs:  CBC: Recent Labs    07/01/17 0605 07/06/17 0445 07/08/17 0519 07/11/17 0905 07/18/17 0725  WBC 10.4 11.8* 11.3* 11.6* 7.2  HGB 10.0* 10.2* 10.3*  --  10.7*  HCT 29.9* 31.7* 32.1* 33.7* 32.5*  PLT 366 385 439* 391 355    COAGS: Recent Labs    07/01/17 0605  INR 1.01    BMP: Recent Labs    07/04/17 0832 07/06/17 0445 07/08/17 0519 07/11/17 0905  NA 135 136 135 135*  K 4.5 4.1 4.3 4.9  CL 102 103 98* 99  CO2 24 27 25 29   GLUCOSE 144* 121* 141* 219*  BUN 13 22* 22* 29*  CALCIUM 8.3* 8.6* 8.9 9.5  CREATININE 0.66 0.82 0.72 0.84  GFRNONAA >60 >60 >60 >60  GFRAA >60 >60 >60 >60    LIVER FUNCTION TESTS: Recent Labs    07/11/17 0905  BILITOT 0.2  AST 23  ALT 33  ALKPHOS 123  PROT 6.9  ALBUMIN 2.7*    TUMOR MARKERS: No results for input(s): AFPTM, CEA, CA199, CHROMGRNA in the last 8760 hours.  Assessment and Plan:  Small cell cancer- lung cancer To start chemo next week Scheduled for Port a cath today Risks and benefits of image guided port-a-catheter placement  was discussed with the patient including, but not limited to bleeding, infection, pneumothorax, or fibrin sheath development and need for additional procedures.  All of the patient's questions were answered, patient is agreeable to proceed. Consent signed and in chart.  Thank you for this interesting consult.  I greatly enjoyed meeting Katherine Cole and look forward to participating in their care.  A copy of this report was sent to the requesting provider on this date.  Electronically Signed: Lavonia Drafts, PA-C 07/18/2017, 8:04 AM   I spent a total of  30 Minutes   in face to face in clinical consultation, greater than 50% of which was counseling/coordinating care for [facility] placement

## 2017-07-21 ENCOUNTER — Other Ambulatory Visit: Payer: Self-pay | Admitting: Medical Oncology

## 2017-07-21 DIAGNOSIS — C3492 Malignant neoplasm of unspecified part of left bronchus or lung: Secondary | ICD-10-CM

## 2017-07-22 ENCOUNTER — Other Ambulatory Visit: Payer: Self-pay | Admitting: Medical Oncology

## 2017-07-22 ENCOUNTER — Inpatient Hospital Stay: Payer: Medicare Other

## 2017-07-22 VITALS — BP 170/64 | HR 59 | Temp 97.7°F | Resp 17

## 2017-07-22 DIAGNOSIS — C3492 Malignant neoplasm of unspecified part of left bronchus or lung: Secondary | ICD-10-CM

## 2017-07-22 DIAGNOSIS — Z95828 Presence of other vascular implants and grafts: Secondary | ICD-10-CM | POA: Insufficient documentation

## 2017-07-22 DIAGNOSIS — Z5111 Encounter for antineoplastic chemotherapy: Secondary | ICD-10-CM | POA: Diagnosis not present

## 2017-07-22 LAB — CMP (CANCER CENTER ONLY)
ALBUMIN: 2.9 g/dL — AB (ref 3.5–5.0)
ALT: 12 U/L (ref 0–55)
AST: 15 U/L (ref 5–34)
Alkaline Phosphatase: 115 U/L (ref 40–150)
Anion gap: 8 (ref 3–11)
BUN: 18 mg/dL (ref 7–26)
CHLORIDE: 99 mmol/L (ref 98–109)
CO2: 26 mmol/L (ref 22–29)
CREATININE: 0.89 mg/dL (ref 0.60–1.10)
Calcium: 9.4 mg/dL (ref 8.4–10.4)
GFR, Est AFR Am: >60 mL/min (ref >60)
GFR, Estimated: >60 mL/min (ref >60)
GLUCOSE: 325 mg/dL — AB (ref 70–140)
POTASSIUM: 4.6 mmol/L (ref 3.5–5.1)
Sodium: 133 mmol/L — ABNORMAL LOW (ref 136–145)
Total Bilirubin: 0.3 mg/dL (ref 0.2–1.2)
Total Protein: 7 g/dL (ref 6.4–8.3)

## 2017-07-22 LAB — CBC WITH DIFFERENTIAL (CANCER CENTER ONLY)
Basophils Absolute: 0.1 10*3/uL (ref 0.0–0.1)
Basophils Relative: 1 %
EOS ABS: 0.4 10*3/uL (ref 0.0–0.5)
EOS PCT: 5 %
HCT: 33 % — ABNORMAL LOW (ref 34.8–46.6)
Hemoglobin: 10.9 g/dL — ABNORMAL LOW (ref 11.6–15.9)
LYMPHS ABS: 1.9 10*3/uL (ref 0.9–3.3)
LYMPHS PCT: 24 %
MCH: 29.5 pg (ref 25.1–34.0)
MCHC: 33 g/dL (ref 31.5–36.0)
MCV: 89.4 fL (ref 79.5–101.0)
MONO ABS: 0.5 10*3/uL (ref 0.1–0.9)
MONOS PCT: 6 %
Neutro Abs: 5.1 10*3/uL (ref 1.5–6.5)
Neutrophils Relative %: 64 %
PLATELETS: 335 10*3/uL (ref 145–400)
RBC: 3.69 MIL/uL — AB (ref 3.70–5.45)
RDW: 14.3 % (ref 11.2–14.5)
WBC: 7.9 10*3/uL (ref 3.9–10.3)

## 2017-07-22 MED ORDER — SODIUM CHLORIDE 0.9% FLUSH
10.0000 mL | INTRAVENOUS | Status: DC | PRN
  Administered 2017-07-22: 10 mL
  Filled 2017-07-22: qty 10

## 2017-07-22 MED ORDER — PROCHLORPERAZINE MALEATE 10 MG PO TABS: 10.0000 mg | ORAL_TABLET | Freq: Four times a day (QID) | ORAL | 0 refills | Status: DC | PRN

## 2017-07-22 MED ORDER — PALONOSETRON HCL INJECTION 0.25 MG/5ML
0.2500 mg | Freq: Once | INTRAVENOUS | Status: AC
  Administered 2017-07-22: 0.25 mg via INTRAVENOUS

## 2017-07-22 MED ORDER — SODIUM CHLORIDE 0.9 % IV SOLN
270.5000 mg | Freq: Once | INTRAVENOUS | Status: AC
  Administered 2017-07-22: 270 mg via INTRAVENOUS
  Filled 2017-07-22: qty 27

## 2017-07-22 MED ORDER — SODIUM CHLORIDE 0.9 % IV SOLN
Freq: Once | INTRAVENOUS | Status: AC
  Administered 2017-07-22: 13:00:00 via INTRAVENOUS
  Filled 2017-07-22: qty 5

## 2017-07-22 MED ORDER — SODIUM CHLORIDE 0.9 % IV SOLN
Freq: Once | INTRAVENOUS | Status: AC
  Administered 2017-07-22: 12:00:00 via INTRAVENOUS

## 2017-07-22 MED ORDER — HEPARIN SOD (PORK) LOCK FLUSH 100 UNIT/ML IV SOLN
500.0000 [IU] | Freq: Once | INTRAVENOUS | Status: AC | PRN
  Administered 2017-07-22: 500 [IU]
  Filled 2017-07-22: qty 5

## 2017-07-22 MED ORDER — SODIUM CHLORIDE 0.9 % IV SOLN
100.0000 mg/m2 | Freq: Once | INTRAVENOUS | Status: AC
  Administered 2017-07-22: 130 mg via INTRAVENOUS
  Filled 2017-07-22: qty 6.5

## 2017-07-22 MED ORDER — PALONOSETRON HCL INJECTION 0.25 MG/5ML
INTRAVENOUS | Status: AC
  Filled 2017-07-22: qty 5

## 2017-07-22 MED ORDER — SODIUM CHLORIDE 0.9% FLUSH
10.0000 mL | Freq: Once | INTRAVENOUS | Status: AC
  Administered 2017-07-22: 10 mL
  Filled 2017-07-22: qty 10

## 2017-07-22 NOTE — Patient Instructions (Signed)
[facility] Discharge Instructions for Patients Receiving Chemotherapy  Today you received the following chemotherapy agents:  Etoposide and Carboplatin.  To help prevent nausea and vomiting after your treatment, we encourage you to take your nausea medication as directed.   If you develop nausea and vomiting that is not controlled by your nausea medication, call the clinic.   BELOW ARE SYMPTOMS THAT SHOULD BE REPORTED IMMEDIATELY:  *FEVER GREATER THAN 100.5 F  *CHILLS WITH OR WITHOUT FEVER  NAUSEA AND VOMITING THAT IS NOT CONTROLLED WITH YOUR NAUSEA MEDICATION  *UNUSUAL SHORTNESS OF BREATH  *UNUSUAL BRUISING OR BLEEDING  TENDERNESS IN MOUTH AND THROAT WITH OR WITHOUT PRESENCE OF ULCERS  *URINARY PROBLEMS  *BOWEL PROBLEMS  UNUSUAL RASH Items with * indicate a potential emergency and should be followed up as soon as possible.  Feel free to call the clinic should you have any questions or concerns. The clinic phone number is [phone].  Please show the [place] at check-in to the Emergency Department and triage nurse.  Etoposide, VP-16 injection What is this medicine? ETOPOSIDE, VP-16 (e toe POE side) is a chemotherapy drug. It is used to treat testicular cancer, lung cancer, and other cancers. This medicine may be used for other purposes; ask your health care provider or pharmacist if you have questions. COMMON BRAND NAME(S): Etopophos, Toposar, VePesid What should I tell my health care provider before I take this medicine? They need to know if you have any of these conditions: -infection -kidney disease -liver disease -low blood counts, like low white cell, platelet, or red cell counts -an unusual or allergic reaction to etoposide, other medicines, foods, dyes, or preservatives -pregnant or trying to get pregnant -breast-feeding How should I use this medicine? This medicine is for infusion into a vein. It is administered in a hospital or  clinic by a specially trained health care professional. Talk to your pediatrician regarding the use of this medicine in children. Special care may be needed. Overdosage: If you think you have taken too much of this medicine contact a poison control center or emergency room at once. NOTE: This medicine is only for you. Do not share this medicine with others. What if I miss a dose? It is important not to miss your dose. Call your doctor or health care professional if you are unable to keep an appointment. What may interact with this medicine? -aspirin -certain medications for seizures like carbamazepine, phenobarbital, phenytoin, valproic acid -cyclosporine -levamisole -warfarin This list may not describe all possible interactions. Give your health care provider a list of all the medicines, herbs, non-prescription drugs, or dietary supplements you use. Also tell them if you smoke, drink alcohol, or use illegal drugs. Some items may interact with your medicine. What should I watch for while using this medicine? Visit your doctor for checks on your progress. This drug may make you feel generally unwell. This is not uncommon, as chemotherapy can affect healthy cells as well as cancer cells. Report any side effects. Continue your course of treatment even though you feel ill unless your doctor tells you to stop. In some cases, you may be given additional medicines to help with side effects. Follow all directions for their use. Call your doctor or health care professional for advice if you get a fever, chills or sore throat, or other symptoms of a cold or flu. Do not treat yourself. This drug decreases your body's ability to fight infections. Try to avoid being around people who  are sick. This medicine may increase your risk to bruise or bleed. Call your doctor or health care professional if you notice any unusual bleeding. Talk to your doctor about your risk of cancer. You may be more at risk for certain  types of cancers if you take this medicine. Do not become pregnant while taking this medicine or for at least 6 months after stopping it. Women should inform their doctor if they wish to become pregnant or think they might be pregnant. Women of child-bearing potential will need to have a negative pregnancy test before starting this medicine. There is a potential for serious side effects to an unborn child. Talk to your health care professional or pharmacist for more information. Do not breast-feed an infant while taking this medicine. Men must use a latex condom during sexual contact with a woman while taking this medicine and for at least 4 months after stopping it. A latex condom is needed even if you have had a vasectomy. Contact your doctor right away if your partner becomes pregnant. Do not donate sperm while taking this medicine and for at least 4 months after you stop taking this medicine. Men should inform their doctors if they wish to father a child. This medicine may lower sperm counts. What side effects may I notice from receiving this medicine? Side effects that you should report to your doctor or health care professional as soon as possible: -allergic reactions like skin rash, itching or hives, swelling of the face, lips, or tongue -low blood counts - this medicine may decrease the number of white blood cells, red blood cells and platelets. You may be at increased risk for infections and bleeding. -signs of infection - fever or chills, cough, sore throat, pain or difficulty passing urine -signs of decreased platelets or bleeding - bruising, pinpoint red spots on the skin, black, tarry stools, blood in the urine -signs of decreased red blood cells - unusually weak or tired, fainting spells, lightheadedness -breathing problems -changes in vision -mouth or throat sores or ulcers -pain, redness, swelling or irritation at the injection site -pain, tingling, numbness in the hands or  feet -redness, blistering, peeling or loosening of the skin, including inside the mouth -seizures -vomiting Side effects that usually do not require medical attention (report to your doctor or health care professional if they continue or are bothersome): -diarrhea -hair loss -loss of appetite -nausea -stomach pain This list may not describe all possible side effects. Call your doctor for medical advice about side effects. You may report side effects to FDA at 1-800-FDA-1088. Where should I keep my medicine? This drug is given in a hospital or clinic and will not be stored at home. NOTE: This sheet is a summary. It may not cover all possible information. If you have questions about this medicine, talk to your doctor, pharmacist, or health care provider.  2018 [employer] Standard (2015-03-17 11:53:23)  Carboplatin injection What is this medicine? CARBOPLATIN (KAR boe pla tin) is a chemotherapy drug. It targets fast dividing cells, like cancer cells, and causes these cells to die. This medicine is used to treat ovarian cancer and many other cancers. This medicine may be used for other purposes; ask your health care provider or pharmacist if you have questions. COMMON BRAND NAME(S): Paraplatin What should I tell my health care provider before I take this medicine? They need to know if you have any of these conditions: -blood disorders -hearing problems -kidney disease -recent or ongoing radiation therapy -an unusual  or allergic reaction to carboplatin, cisplatin, other chemotherapy, other medicines, foods, dyes, or preservatives -pregnant or trying to get pregnant -breast-feeding How should I use this medicine? This drug is usually given as an infusion into a vein. It is administered in a hospital or clinic by a specially trained health care professional. Talk to your pediatrician regarding the use of this medicine in children. Special care may be needed. Overdosage: If you think you  have taken too much of this medicine contact a poison control center or emergency room at once. NOTE: This medicine is only for you. Do not share this medicine with others. What if I miss a dose? It is important not to miss a dose. Call your doctor or health care professional if you are unable to keep an appointment. What may interact with this medicine? -medicines for seizures -medicines to increase blood counts like filgrastim, pegfilgrastim, sargramostim -some antibiotics like amikacin, gentamicin, neomycin, streptomycin, tobramycin -vaccines Talk to your doctor or health care professional before taking any of these medicines: -acetaminophen -aspirin -ibuprofen -ketoprofen -naproxen This list may not describe all possible interactions. Give your health care provider a list of all the medicines, herbs, non-prescription drugs, or dietary supplements you use. Also tell them if you smoke, drink alcohol, or use illegal drugs. Some items may interact with your medicine. What should I watch for while using this medicine? Your condition will be monitored carefully while you are receiving this medicine. You will need important blood work done while you are taking this medicine. This drug may make you feel generally unwell. This is not uncommon, as chemotherapy can affect healthy cells as well as cancer cells. Report any side effects. Continue your course of treatment even though you feel ill unless your doctor tells you to stop. In some cases, you may be given additional medicines to help with side effects. Follow all directions for their use. Call your doctor or health care professional for advice if you get a fever, chills or sore throat, or other symptoms of a cold or flu. Do not treat yourself. This drug decreases your body's ability to fight infections. Try to avoid being around people who are sick. This medicine may increase your risk to bruise or bleed. Call your doctor or health care  professional if you notice any unusual bleeding. Be careful brushing and flossing your teeth or using a toothpick because you may get an infection or bleed more easily. If you have any dental work done, tell your dentist you are receiving this medicine. Avoid taking products that contain aspirin, acetaminophen, ibuprofen, naproxen, or ketoprofen unless instructed by your doctor. These medicines may hide a fever. Do not become pregnant while taking this medicine. Women should inform their doctor if they wish to become pregnant or think they might be pregnant. There is a potential for serious side effects to an unborn child. Talk to your health care professional or pharmacist for more information. Do not breast-feed an infant while taking this medicine. What side effects may I notice from receiving this medicine? Side effects that you should report to your doctor or health care professional as soon as possible: -allergic reactions like skin rash, itching or hives, swelling of the face, lips, or tongue -signs of infection - fever or chills, cough, sore throat, pain or difficulty passing urine -signs of decreased platelets or bleeding - bruising, pinpoint red spots on the skin, black, tarry stools, nosebleeds -signs of decreased red blood cells - unusually weak or tired, fainting  spells, lightheadedness -breathing problems -changes in hearing -changes in vision -chest pain -high blood pressure -low blood counts - This drug may decrease the number of white blood cells, red blood cells and platelets. You may be at increased risk for infections and bleeding. -nausea and vomiting -pain, swelling, redness or irritation at the injection site -pain, tingling, numbness in the hands or feet -problems with balance, talking, walking -trouble passing urine or change in the amount of urine Side effects that usually do not require medical attention (report to your doctor or health care professional if they  continue or are bothersome): -hair loss -loss of appetite -metallic taste in the mouth or changes in taste This list may not describe all possible side effects. Call your doctor for medical advice about side effects. You may report side effects to FDA at 1-800-FDA-1088. Where should I keep my medicine? This drug is given in a hospital or clinic and will not be stored at home. NOTE: This sheet is a summary. It may not cover all possible information. If you have questions about this medicine, talk to your doctor, pharmacist, or health care provider.  2018 [employer] Standard (2007-06-30 14:38:05)

## 2017-07-23 ENCOUNTER — Inpatient Hospital Stay: Payer: Medicare Other

## 2017-07-23 ENCOUNTER — Telehealth: Payer: Self-pay | Admitting: Internal Medicine

## 2017-07-23 VITALS — BP 131/60 | HR 67 | Temp 98.7°F | Resp 18

## 2017-07-23 DIAGNOSIS — Z5111 Encounter for antineoplastic chemotherapy: Secondary | ICD-10-CM | POA: Diagnosis not present

## 2017-07-23 DIAGNOSIS — C3492 Malignant neoplasm of unspecified part of left bronchus or lung: Secondary | ICD-10-CM

## 2017-07-23 MED ORDER — DEXAMETHASONE SODIUM PHOSPHATE 10 MG/ML IJ SOLN
INTRAMUSCULAR | Status: AC
  Filled 2017-07-23: qty 1

## 2017-07-23 MED ORDER — SODIUM CHLORIDE 0.9 % IV SOLN
100.0000 mg/m2 | Freq: Once | INTRAVENOUS | Status: AC
  Administered 2017-07-23: 130 mg via INTRAVENOUS
  Filled 2017-07-23: qty 6.5

## 2017-07-23 MED ORDER — SODIUM CHLORIDE 0.9 % IV SOLN
Freq: Once | INTRAVENOUS | Status: AC
  Administered 2017-07-23: 15:00:00 via INTRAVENOUS

## 2017-07-23 MED ORDER — DEXAMETHASONE SODIUM PHOSPHATE 10 MG/ML IJ SOLN
10.0000 mg | Freq: Once | INTRAMUSCULAR | Status: AC
Start: 2017-07-23 — End: 2017-07-23
  Administered 2017-07-23: 10 mg via INTRAVENOUS

## 2017-07-23 NOTE — Telephone Encounter (Signed)
Spoke with patient's daughter, Roland Earl, to inform FMLA has been successfully faxed to [employer] at [phone]. Mailed a copy to address on file.

## 2017-07-23 NOTE — Patient Instructions (Signed)
[facility] Discharge Instructions for Patients Receiving Chemotherapy  Today you received the following chemotherapy agents:  Etoposide  To help prevent nausea and vomiting after your treatment, we encourage you to take your nausea medication as directed.   If you develop nausea and vomiting that is not controlled by your nausea medication, call the clinic.   BELOW ARE SYMPTOMS THAT SHOULD BE REPORTED IMMEDIATELY:  *FEVER GREATER THAN 100.5 F  *CHILLS WITH OR WITHOUT FEVER  NAUSEA AND VOMITING THAT IS NOT CONTROLLED WITH YOUR NAUSEA MEDICATION  *UNUSUAL SHORTNESS OF BREATH  *UNUSUAL BRUISING OR BLEEDING  TENDERNESS IN MOUTH AND THROAT WITH OR WITHOUT PRESENCE OF ULCERS  *URINARY PROBLEMS  *BOWEL PROBLEMS  UNUSUAL RASH Items with * indicate a potential emergency and should be followed up as soon as possible.  Feel free to call the clinic should you have any questions or concerns. The clinic phone number is [phone].  Please show the [facility] at check-in to the Emergency Department and triage nurse.  Etoposide, VP-16 injection What is this medicine? ETOPOSIDE, VP-16 (e toe POE side) is a chemotherapy drug. It is used to treat testicular cancer, lung cancer, and other cancers. This medicine may be used for other purposes; ask your health care provider or pharmacist if you have questions. COMMON BRAND NAME(S): Etopophos, Toposar, VePesid What should I tell my health care provider before I take this medicine? They need to know if you have any of these conditions: -infection -kidney disease -liver disease -low blood counts, like low white cell, platelet, or red cell counts -an unusual or allergic reaction to etoposide, other medicines, foods, dyes, or preservatives -pregnant or trying to get pregnant -breast-feeding How should I use this medicine? This medicine is for infusion into a vein. It is administered in a hospital or clinic by a specially  trained health care professional. Talk to your pediatrician regarding the use of this medicine in children. Special care may be needed. Overdosage: If you think you have taken too much of this medicine contact a poison control center or emergency room at once. NOTE: This medicine is only for you. Do not share this medicine with others. What if I miss a dose? It is important not to miss your dose. Call your doctor or health care professional if you are unable to keep an appointment. What may interact with this medicine? -aspirin -certain medications for seizures like carbamazepine, phenobarbital, phenytoin, valproic acid -cyclosporine -levamisole -warfarin This list may not describe all possible interactions. Give your health care provider a list of all the medicines, herbs, non-prescription drugs, or dietary supplements you use. Also tell them if you smoke, drink alcohol, or use illegal drugs. Some items may interact with your medicine. What should I watch for while using this medicine? Visit your doctor for checks on your progress. This drug may make you feel generally unwell. This is not uncommon, as chemotherapy can affect healthy cells as well as cancer cells. Report any side effects. Continue your course of treatment even though you feel ill unless your doctor tells you to stop. In some cases, you may be given additional medicines to help with side effects. Follow all directions for their use. Call your doctor or health care professional for advice if you get a fever, chills or sore throat, or other symptoms of a cold or flu. Do not treat yourself. This drug decreases your body's ability to fight infections. Try to avoid being around people who are sick.  This medicine may increase your risk to bruise or bleed. Call your doctor or health care professional if you notice any unusual bleeding. Talk to your doctor about your risk of cancer. You may be more at risk for certain types of cancers if  you take this medicine. Do not become pregnant while taking this medicine or for at least 6 months after stopping it. Women should inform their doctor if they wish to become pregnant or think they might be pregnant. Women of child-bearing potential will need to have a negative pregnancy test before starting this medicine. There is a potential for serious side effects to an unborn child. Talk to your health care professional or pharmacist for more information. Do not breast-feed an infant while taking this medicine. Men must use a latex condom during sexual contact with a woman while taking this medicine and for at least 4 months after stopping it. A latex condom is needed even if you have had a vasectomy. Contact your doctor right away if your partner becomes pregnant. Do not donate sperm while taking this medicine and for at least 4 months after you stop taking this medicine. Men should inform their doctors if they wish to father a child. This medicine may lower sperm counts. What side effects may I notice from receiving this medicine? Side effects that you should report to your doctor or health care professional as soon as possible: -allergic reactions like skin rash, itching or hives, swelling of the face, lips, or tongue -low blood counts - this medicine may decrease the number of white blood cells, red blood cells and platelets. You may be at increased risk for infections and bleeding. -signs of infection - fever or chills, cough, sore throat, pain or difficulty passing urine -signs of decreased platelets or bleeding - bruising, pinpoint red spots on the skin, black, tarry stools, blood in the urine -signs of decreased red blood cells - unusually weak or tired, fainting spells, lightheadedness -breathing problems -changes in vision -mouth or throat sores or ulcers -pain, redness, swelling or irritation at the injection site -pain, tingling, numbness in the hands or feet -redness, blistering,  peeling or loosening of the skin, including inside the mouth -seizures -vomiting Side effects that usually do not require medical attention (report to your doctor or health care professional if they continue or are bothersome): -diarrhea -hair loss -loss of appetite -nausea -stomach pain This list may not describe all possible side effects. Call your doctor for medical advice about side effects. You may report side effects to FDA at 1-800-FDA-1088. Where should I keep my medicine? This drug is given in a hospital or clinic and will not be stored at home. NOTE: This sheet is a summary. It may not cover all possible information. If you have questions about this medicine, talk to your doctor, pharmacist, or health care provider.  2018 Elsevier/Gold Standard (2015-03-17 11:53:23)  Carboplatin injection What is this medicine? CARBOPLATIN (KAR boe pla tin) is a chemotherapy drug. It targets fast dividing cells, like cancer cells, and causes these cells to die. This medicine is used to treat ovarian cancer and many other cancers. This medicine may be used for other purposes; ask your health care provider or pharmacist if you have questions. COMMON BRAND NAME(S): Paraplatin What should I tell my health care provider before I take this medicine? They need to know if you have any of these conditions: -blood disorders -hearing problems -kidney disease -recent or ongoing radiation therapy -an unusual or allergic  reaction to carboplatin, cisplatin, other chemotherapy, other medicines, foods, dyes, or preservatives -pregnant or trying to get pregnant -breast-feeding How should I use this medicine? This drug is usually given as an infusion into a vein. It is administered in a hospital or clinic by a specially trained health care professional. Talk to your pediatrician regarding the use of this medicine in children. Special care may be needed. Overdosage: If you think you have taken too much of this  medicine contact a poison control center or emergency room at once. NOTE: This medicine is only for you. Do not share this medicine with others. What if I miss a dose? It is important not to miss a dose. Call your doctor or health care professional if you are unable to keep an appointment. What may interact with this medicine? -medicines for seizures -medicines to increase blood counts like filgrastim, pegfilgrastim, sargramostim -some antibiotics like amikacin, gentamicin, neomycin, streptomycin, tobramycin -vaccines Talk to your doctor or health care professional before taking any of these medicines: -acetaminophen -aspirin -ibuprofen -ketoprofen -naproxen This list may not describe all possible interactions. Give your health care provider a list of all the medicines, herbs, non-prescription drugs, or dietary supplements you use. Also tell them if you smoke, drink alcohol, or use illegal drugs. Some items may interact with your medicine. What should I watch for while using this medicine? Your condition will be monitored carefully while you are receiving this medicine. You will need important blood work done while you are taking this medicine. This drug may make you feel generally unwell. This is not uncommon, as chemotherapy can affect healthy cells as well as cancer cells. Report any side effects. Continue your course of treatment even though you feel ill unless your doctor tells you to stop. In some cases, you may be given additional medicines to help with side effects. Follow all directions for their use. Call your doctor or health care professional for advice if you get a fever, chills or sore throat, or other symptoms of a cold or flu. Do not treat yourself. This drug decreases your body's ability to fight infections. Try to avoid being around people who are sick. This medicine may increase your risk to bruise or bleed. Call your doctor or health care professional if you notice any  unusual bleeding. Be careful brushing and flossing your teeth or using a toothpick because you may get an infection or bleed more easily. If you have any dental work done, tell your dentist you are receiving this medicine. Avoid taking products that contain aspirin, acetaminophen, ibuprofen, naproxen, or ketoprofen unless instructed by your doctor. These medicines may hide a fever. Do not become pregnant while taking this medicine. Women should inform their doctor if they wish to become pregnant or think they might be pregnant. There is a potential for serious side effects to an unborn child. Talk to your health care professional or pharmacist for more information. Do not breast-feed an infant while taking this medicine. What side effects may I notice from receiving this medicine? Side effects that you should report to your doctor or health care professional as soon as possible: -allergic reactions like skin rash, itching or hives, swelling of the face, lips, or tongue -signs of infection - fever or chills, cough, sore throat, pain or difficulty passing urine -signs of decreased platelets or bleeding - bruising, pinpoint red spots on the skin, black, tarry stools, nosebleeds -signs of decreased red blood cells - unusually weak or tired, fainting spells, lightheadedness -  breathing problems -changes in hearing -changes in vision -chest pain -high blood pressure -low blood counts - This drug may decrease the number of white blood cells, red blood cells and platelets. You may be at increased risk for infections and bleeding. -nausea and vomiting -pain, swelling, redness or irritation at the injection site -pain, tingling, numbness in the hands or feet -problems with balance, talking, walking -trouble passing urine or change in the amount of urine Side effects that usually do not require medical attention (report to your doctor or health care professional if they continue or are bothersome): -hair  loss -loss of appetite -metallic taste in the mouth or changes in taste This list may not describe all possible side effects. Call your doctor for medical advice about side effects. You may report side effects to FDA at 1-800-FDA-1088. Where should I keep my medicine? This drug is given in a hospital or clinic and will not be stored at home. NOTE: This sheet is a summary. It may not cover all possible information. If you have questions about this medicine, talk to your doctor, pharmacist, or health care provider.  2018 Elsevier/Gold Standard (2007-06-30 14:38:05)

## 2017-07-24 ENCOUNTER — Inpatient Hospital Stay: Payer: Medicare Other

## 2017-07-24 VITALS — BP 156/64 | HR 67 | Temp 98.8°F | Resp 17

## 2017-07-24 DIAGNOSIS — C3492 Malignant neoplasm of unspecified part of left bronchus or lung: Secondary | ICD-10-CM

## 2017-07-24 DIAGNOSIS — Z5111 Encounter for antineoplastic chemotherapy: Secondary | ICD-10-CM | POA: Diagnosis not present

## 2017-07-24 MED ORDER — DEXAMETHASONE SODIUM PHOSPHATE 10 MG/ML IJ SOLN
INTRAMUSCULAR | Status: AC
  Filled 2017-07-24: qty 1

## 2017-07-24 MED ORDER — DEXAMETHASONE SODIUM PHOSPHATE 10 MG/ML IJ SOLN
10.0000 mg | Freq: Once | INTRAMUSCULAR | Status: AC
  Administered 2017-07-24: 10 mg via INTRAVENOUS

## 2017-07-24 MED ORDER — SODIUM CHLORIDE 0.9 % IV SOLN
100.0000 mg/m2 | Freq: Once | INTRAVENOUS | Status: AC
  Administered 2017-07-24: 130 mg via INTRAVENOUS
  Filled 2017-07-24: qty 6.5

## 2017-07-24 MED ORDER — SODIUM CHLORIDE 0.9 % IV SOLN
Freq: Once | INTRAVENOUS | Status: AC
  Administered 2017-07-24: 14:00:00 via INTRAVENOUS

## 2017-07-24 MED ORDER — SODIUM CHLORIDE 0.9% FLUSH
10.0000 mL | INTRAVENOUS | Status: DC | PRN
  Administered 2017-07-24: 10 mL
  Filled 2017-07-24: qty 10

## 2017-07-24 MED ORDER — HEPARIN SOD (PORK) LOCK FLUSH 100 UNIT/ML IV SOLN
500.0000 [IU] | Freq: Once | INTRAVENOUS | Status: AC | PRN
  Administered 2017-07-24: 500 [IU]
  Filled 2017-07-24: qty 5

## 2017-07-24 NOTE — Patient Instructions (Signed)
[facility] Discharge Instructions for Patients Receiving Chemotherapy  Today you received the following chemotherapy agents:  Etoposide.  To help prevent nausea and vomiting after your treatment, we encourage you to take your nausea medication as directed.   If you develop nausea and vomiting that is not controlled by your nausea medication, call the clinic.   BELOW ARE SYMPTOMS THAT SHOULD BE REPORTED IMMEDIATELY:  *FEVER GREATER THAN 100.5 F  *CHILLS WITH OR WITHOUT FEVER  NAUSEA AND VOMITING THAT IS NOT CONTROLLED WITH YOUR NAUSEA MEDICATION  *UNUSUAL SHORTNESS OF BREATH  *UNUSUAL BRUISING OR BLEEDING  TENDERNESS IN MOUTH AND THROAT WITH OR WITHOUT PRESENCE OF ULCERS  *URINARY PROBLEMS  *BOWEL PROBLEMS  UNUSUAL RASH Items with * indicate a potential emergency and should be followed up as soon as possible.  Feel free to call the clinic should you have any questions or concerns. The clinic phone number is [phone].  Please show the [place] at check-in to the Emergency Department and triage nurse.  Etoposide, VP-16 injection What is this medicine? ETOPOSIDE, VP-16 (e toe POE side) is a chemotherapy drug. It is used to treat testicular cancer, lung cancer, and other cancers. This medicine may be used for other purposes; ask your health care provider or pharmacist if you have questions. COMMON BRAND NAME(S): Etopophos, Toposar, VePesid What should I tell my health care provider before I take this medicine? They need to know if you have any of these conditions: -infection -kidney disease -liver disease -low blood counts, like low white cell, platelet, or red cell counts -an unusual or allergic reaction to etoposide, other medicines, foods, dyes, or preservatives -pregnant or trying to get pregnant -breast-feeding How should I use this medicine? This medicine is for infusion into a vein. It is administered in a hospital or clinic by a  specially trained health care professional. Talk to your pediatrician regarding the use of this medicine in children. Special care may be needed. Overdosage: If you think you have taken too much of this medicine contact a poison control center or emergency room at once. NOTE: This medicine is only for you. Do not share this medicine with others. What if I miss a dose? It is important not to miss your dose. Call your doctor or health care professional if you are unable to keep an appointment. What may interact with this medicine? -aspirin -certain medications for seizures like carbamazepine, phenobarbital, phenytoin, valproic acid -cyclosporine -levamisole -warfarin This list may not describe all possible interactions. Give your health care provider a list of all the medicines, herbs, non-prescription drugs, or dietary supplements you use. Also tell them if you smoke, drink alcohol, or use illegal drugs. Some items may interact with your medicine. What should I watch for while using this medicine? Visit your doctor for checks on your progress. This drug may make you feel generally unwell. This is not uncommon, as chemotherapy can affect healthy cells as well as cancer cells. Report any side effects. Continue your course of treatment even though you feel ill unless your doctor tells you to stop. In some cases, you may be given additional medicines to help with side effects. Follow all directions for their use. Call your doctor or health care professional for advice if you get a fever, chills or sore throat, or other symptoms of a cold or flu. Do not treat yourself. This drug decreases your body's ability to fight infections. Try to avoid being around people who are sick.  This medicine may increase your risk to bruise or bleed. Call your doctor or health care professional if you notice any unusual bleeding. Talk to your doctor about your risk of cancer. You may be more at risk for certain types of  cancers if you take this medicine. Do not become pregnant while taking this medicine or for at least 6 months after stopping it. Women should inform their doctor if they wish to become pregnant or think they might be pregnant. Women of child-bearing potential will need to have a negative pregnancy test before starting this medicine. There is a potential for serious side effects to an unborn child. Talk to your health care professional or pharmacist for more information. Do not breast-feed an infant while taking this medicine. Men must use a latex condom during sexual contact with a woman while taking this medicine and for at least 4 months after stopping it. A latex condom is needed even if you have had a vasectomy. Contact your doctor right away if your partner becomes pregnant. Do not donate sperm while taking this medicine and for at least 4 months after you stop taking this medicine. Men should inform their doctors if they wish to father a child. This medicine may lower sperm counts. What side effects may I notice from receiving this medicine? Side effects that you should report to your doctor or health care professional as soon as possible: -allergic reactions like skin rash, itching or hives, swelling of the face, lips, or tongue -low blood counts - this medicine may decrease the number of white blood cells, red blood cells and platelets. You may be at increased risk for infections and bleeding. -signs of infection - fever or chills, cough, sore throat, pain or difficulty passing urine -signs of decreased platelets or bleeding - bruising, pinpoint red spots on the skin, black, tarry stools, blood in the urine -signs of decreased red blood cells - unusually weak or tired, fainting spells, lightheadedness -breathing problems -changes in vision -mouth or throat sores or ulcers -pain, redness, swelling or irritation at the injection site -pain, tingling, numbness in the hands or feet -redness,  blistering, peeling or loosening of the skin, including inside the mouth -seizures -vomiting Side effects that usually do not require medical attention (report to your doctor or health care professional if they continue or are bothersome): -diarrhea -hair loss -loss of appetite -nausea -stomach pain This list may not describe all possible side effects. Call your doctor for medical advice about side effects. You may report side effects to FDA at 1-800-FDA-1088. Where should I keep my medicine? This drug is given in a hospital or clinic and will not be stored at home. NOTE: This sheet is a summary. It may not cover all possible information. If you have questions about this medicine, talk to your doctor, pharmacist, or health care provider.  2018 Elsevier/Gold Standard (2015-03-17 11:53:23)

## 2017-07-24 NOTE — Progress Notes (Signed)
Per Dr Julien Nordmann ok to tx today with elevated BP. Pt to report this to primary physician and have BP medications adjusted accordingly, pt verbalized understanding.

## 2017-07-25 ENCOUNTER — Inpatient Hospital Stay: Payer: Medicare Other

## 2017-07-25 ENCOUNTER — Ambulatory Visit (HOSPITAL_COMMUNITY): Payer: Medicare Other

## 2017-07-25 VITALS — BP 190/72 | HR 66 | Temp 98.0°F | Resp 17

## 2017-07-25 DIAGNOSIS — C3492 Malignant neoplasm of unspecified part of left bronchus or lung: Secondary | ICD-10-CM

## 2017-07-25 DIAGNOSIS — Z5111 Encounter for antineoplastic chemotherapy: Secondary | ICD-10-CM | POA: Diagnosis not present

## 2017-07-25 MED ORDER — PEGFILGRASTIM-CBQV 6 MG/0.6ML ~~LOC~~ SOSY
PREFILLED_SYRINGE | SUBCUTANEOUS | Status: AC
Start: 2017-07-25 — End: 2017-07-25
  Filled 2017-07-25: qty 0.6

## 2017-07-25 MED ORDER — PEGFILGRASTIM-CBQV 6 MG/0.6ML ~~LOC~~ SOSY
6.0000 mg | PREFILLED_SYRINGE | Freq: Once | SUBCUTANEOUS | Status: AC
  Administered 2017-07-25: 6 mg via SUBCUTANEOUS

## 2017-07-25 NOTE — Patient Instructions (Signed)
Pegfilgrastim injection What is this medicine? PEGFILGRASTIM (PEG fil gra stim) is a long-acting granulocyte colony-stimulating factor that stimulates the growth of neutrophils, a type of white blood cell important in the body's fight against infection. It is used to reduce the incidence of fever and infection in patients with certain types of cancer who are receiving chemotherapy that affects the bone marrow, and to increase survival after being exposed to high doses of radiation. This medicine may be used for other purposes; ask your health care provider or pharmacist if you have questions. COMMON BRAND NAME(S): Neulasta What should I tell my health care provider before I take this medicine? They need to know if you have any of these conditions: -kidney disease -latex allergy -ongoing radiation therapy -sickle cell disease -skin reactions to acrylic adhesives (On-Body Injector only) -an unusual or allergic reaction to pegfilgrastim, filgrastim, other medicines, foods, dyes, or preservatives -pregnant or trying to get pregnant -breast-feeding How should I use this medicine? This medicine is for injection under the skin. If you get this medicine at home, you will be taught how to prepare and give the pre-filled syringe or how to use the On-body Injector. Refer to the patient Instructions for Use for detailed instructions. Use exactly as directed. Tell your healthcare provider immediately if you suspect that the On-body Injector may not have performed as intended or if you suspect the use of the On-body Injector resulted in a missed or partial dose. It is important that you put your used needles and syringes in a special sharps container. Do not put them in a trash can. If you do not have a sharps container, call your pharmacist or healthcare provider to get one. Talk to your pediatrician regarding the use of this medicine in children. While this drug may be prescribed for selected conditions,  precautions do apply. Overdosage: If you think you have taken too much of this medicine contact a poison control center or emergency room at once. NOTE: This medicine is only for you. Do not share this medicine with others. What if I miss a dose? It is important not to miss your dose. Call your doctor or health care professional if you miss your dose. If you miss a dose due to an On-body Injector failure or leakage, a new dose should be administered as soon as possible using a single prefilled syringe for manual use. What may interact with this medicine? Interactions have not been studied. Give your health care provider a list of all the medicines, herbs, non-prescription drugs, or dietary supplements you use. Also tell them if you smoke, drink alcohol, or use illegal drugs. Some items may interact with your medicine. This list may not describe all possible interactions. Give your health care provider a list of all the medicines, herbs, non-prescription drugs, or dietary supplements you use. Also tell them if you smoke, drink alcohol, or use illegal drugs. Some items may interact with your medicine. What should I watch for while using this medicine? You may need blood work done while you are taking this medicine. If you are going to need a MRI, CT scan, or other procedure, tell your doctor that you are using this medicine (On-Body Injector only). What side effects may I notice from receiving this medicine? Side effects that you should report to your doctor or health care professional as soon as possible: -allergic reactions like skin rash, itching or hives, swelling of the face, lips, or tongue -dizziness -fever -pain, redness, or irritation at site   where injected -pinpoint red spots on the skin -red or dark-brown urine -shortness of breath or breathing problems -stomach or side pain, or pain at the shoulder -swelling -tiredness -trouble passing urine or change in the amount of urine Side  effects that usually do not require medical attention (report to your doctor or health care professional if they continue or are bothersome): -bone pain -muscle pain This list may not describe all possible side effects. Call your doctor for medical advice about side effects. You may report side effects to FDA at 1-800-FDA-1088. Where should I keep my medicine? Keep out of the reach of children. Store pre-filled syringes in a refrigerator between 2 and 8 degrees C (36 and 46 degrees F). Do not freeze. Keep in carton to protect from light. Throw away this medicine if it is left out of the refrigerator for more than 48 hours. Throw away any unused medicine after the expiration date. NOTE: This sheet is a summary. It may not cover all possible information. If you have questions about this medicine, talk to your doctor, pharmacist, or health care provider.  2018 Elsevier/Gold Standard (2016-03-21 12:58:03)  

## 2017-07-26 ENCOUNTER — Ambulatory Visit: Payer: Medicare Other

## 2017-07-28 ENCOUNTER — Other Ambulatory Visit: Payer: Self-pay | Admitting: Medical Oncology

## 2017-07-28 DIAGNOSIS — C3492 Malignant neoplasm of unspecified part of left bronchus or lung: Secondary | ICD-10-CM

## 2017-07-28 NOTE — Progress Notes (Signed)
Thoracic Location of Tumor / Histology: Primary small cell carcinoma of left lung.  Patient presented to the emergency department with complaints of nausea, vomiting, diarrhea, productive cough, chills, wheezes and shortness of breath.  PET 07/17/2017: Subpleural mass within the left upper lobe is intensely hypermetabolic compatible with the primary bronchogenic carcinoma.  There is associated ipsilateral hilar and mediastinal nodal metastasis.  MRI Brain 07/15/2017: No evidence of metastatic disease.   Chest xray 06/30/2017: hyperinflation with ill defined upper lobe pulmonary opacity with mild pleural changes in the left upper lobe and possible cavitation of the lesion in the right upper lobe.  CT Chest 06/30/2017: 3.7 cm spiculated mass in the lateral periphery of the left upper lobe at the level of the hilum suspicious for bronchogenic carcinoma.  There was associated abnormal peribronchial soft tissue nodularity tracking to the left hilum and bulky malignant appearing left hilar and AP window lymphadenopathy.  Biopsies of Left lung 07/04/2017   Tobacco/Marijuana/Snuff/ETOH use: Current smoker, down to 3 cigarettes a day.  Past/Anticipated interventions by cardiothoracic surgery, if any:   Past/Anticipated interventions by medical oncology, if any:  Dr. Julien Nordmann 07/11/2017 -I recommended for the patient to complete the staging work-up by ordering a PET scan as well as MRI of the brain to rule out metastatic disease. -I discussed with the patient her treatment options and recommended for her systemic chemotherapy with carboplatin for AUC of 5 on day 1 and etoposide 100 mg/M2 on days 1, 2 and 3 every 3 weeks.  This will be concurrent with radiotherapy if the patient has no evidence of metastatic disease outside the known lesions.  Dr. Julien Nordmann 4/23 8:45 am Concurrent treatment with radiation.   Signs/Symptoms  Weight changes, if any: 4 pounds down since last week.  Respiratory complaints, if  any: None  Hemoptysis, if any: No  Pain issues, if any:  No  BP (!) 147/63   Pulse 65   Temp 97.9 F (36.6 C)   Resp 16   Ht 5\' 1"  (1.549 m)   Wt 80 lb 12.8 oz (36.7 kg)   SpO2 100%   BMI 15.27 kg/m    Wt Readings from Last 3 Encounters:  07/29/17 80 lb 12.8 oz (36.7 kg)  07/29/17 81 lb 1.6 oz (36.8 kg)  07/18/17 82 lb (37.2 kg)    SAFETY ISSUES:  Prior radiation? No  Pacemaker/ICD? No  Possible current pregnancy? No, Tubal ligation  Is the patient on methotrexate?  No  Current Complaints / other details: She endorses a great appetite.  No complaints of pain or SOB.

## 2017-07-29 ENCOUNTER — Inpatient Hospital Stay (HOSPITAL_BASED_OUTPATIENT_CLINIC_OR_DEPARTMENT_OTHER): Payer: Medicare Other | Admitting: Internal Medicine

## 2017-07-29 ENCOUNTER — Ambulatory Visit
Admission: RE | Admit: 2017-07-29 | Discharge: 2017-07-29 | Disposition: A | Discharge status: Home / Self Care | Payer: Medicare Other | Source: Ambulatory Visit | Attending: Radiation Oncology | Admitting: Radiation Oncology

## 2017-07-29 ENCOUNTER — Other Ambulatory Visit: Payer: Self-pay

## 2017-07-29 ENCOUNTER — Inpatient Hospital Stay: Payer: Medicare Other

## 2017-07-29 ENCOUNTER — Telehealth: Payer: Self-pay | Admitting: Internal Medicine

## 2017-07-29 ENCOUNTER — Encounter: Payer: Self-pay | Admitting: Internal Medicine

## 2017-07-29 ENCOUNTER — Encounter: Payer: Self-pay | Admitting: Radiation Oncology

## 2017-07-29 VITALS — BP 166/49 | HR 62 | Temp 97.5°F | Resp 18 | Ht 61.0 in | Wt 81.1 lb

## 2017-07-29 VITALS — BP 147/63 | HR 65 | Temp 97.9°F | Resp 16 | Ht 61.0 in | Wt 80.8 lb

## 2017-07-29 DIAGNOSIS — C787 Secondary malignant neoplasm of liver and intrahepatic bile duct: Secondary | ICD-10-CM | POA: Diagnosis not present

## 2017-07-29 DIAGNOSIS — R251 Tremor, unspecified: Secondary | ICD-10-CM | POA: Insufficient documentation

## 2017-07-29 DIAGNOSIS — M791 Myalgia, unspecified site: Secondary | ICD-10-CM | POA: Diagnosis not present

## 2017-07-29 DIAGNOSIS — M8580 Other specified disorders of bone density and structure, unspecified site: Secondary | ICD-10-CM | POA: Insufficient documentation

## 2017-07-29 DIAGNOSIS — E119 Type 2 diabetes mellitus without complications: Secondary | ICD-10-CM | POA: Diagnosis not present

## 2017-07-29 DIAGNOSIS — F17210 Nicotine dependence, cigarettes, uncomplicated: Secondary | ICD-10-CM | POA: Diagnosis not present

## 2017-07-29 DIAGNOSIS — M1990 Unspecified osteoarthritis, unspecified site: Secondary | ICD-10-CM | POA: Diagnosis not present

## 2017-07-29 DIAGNOSIS — C3492 Malignant neoplasm of unspecified part of left bronchus or lung: Secondary | ICD-10-CM

## 2017-07-29 DIAGNOSIS — Z8673 Personal history of transient ischemic attack (TIA), and cerebral infarction without residual deficits: Secondary | ICD-10-CM | POA: Insufficient documentation

## 2017-07-29 DIAGNOSIS — C7951 Secondary malignant neoplasm of bone: Secondary | ICD-10-CM

## 2017-07-29 DIAGNOSIS — I700 Atherosclerosis of aorta: Secondary | ICD-10-CM | POA: Insufficient documentation

## 2017-07-29 DIAGNOSIS — Z808 Family history of malignant neoplasm of other organs or systems: Secondary | ICD-10-CM | POA: Insufficient documentation

## 2017-07-29 DIAGNOSIS — R42 Dizziness and giddiness: Secondary | ICD-10-CM | POA: Insufficient documentation

## 2017-07-29 DIAGNOSIS — N200 Calculus of kidney: Secondary | ICD-10-CM | POA: Insufficient documentation

## 2017-07-29 DIAGNOSIS — Z86010 Personal history of colonic polyps: Secondary | ICD-10-CM | POA: Diagnosis not present

## 2017-07-29 DIAGNOSIS — Z95828 Presence of other vascular implants and grafts: Secondary | ICD-10-CM

## 2017-07-29 DIAGNOSIS — Z801 Family history of malignant neoplasm of trachea, bronchus and lung: Secondary | ICD-10-CM | POA: Insufficient documentation

## 2017-07-29 DIAGNOSIS — E46 Unspecified protein-calorie malnutrition: Secondary | ICD-10-CM | POA: Diagnosis not present

## 2017-07-29 DIAGNOSIS — Z5111 Encounter for antineoplastic chemotherapy: Secondary | ICD-10-CM

## 2017-07-29 DIAGNOSIS — E785 Hyperlipidemia, unspecified: Secondary | ICD-10-CM | POA: Diagnosis not present

## 2017-07-29 DIAGNOSIS — F329 Major depressive disorder, single episode, unspecified: Secondary | ICD-10-CM | POA: Diagnosis not present

## 2017-07-29 DIAGNOSIS — C771 Secondary and unspecified malignant neoplasm of intrathoracic lymph nodes: Secondary | ICD-10-CM | POA: Diagnosis not present

## 2017-07-29 DIAGNOSIS — M129 Arthropathy, unspecified: Secondary | ICD-10-CM | POA: Insufficient documentation

## 2017-07-29 DIAGNOSIS — Z9221 Personal history of antineoplastic chemotherapy: Secondary | ICD-10-CM | POA: Insufficient documentation

## 2017-07-29 DIAGNOSIS — R634 Abnormal weight loss: Secondary | ICD-10-CM | POA: Diagnosis not present

## 2017-07-29 DIAGNOSIS — I2510 Atherosclerotic heart disease of native coronary artery without angina pectoris: Secondary | ICD-10-CM | POA: Diagnosis not present

## 2017-07-29 DIAGNOSIS — Z7982 Long term (current) use of aspirin: Secondary | ICD-10-CM | POA: Diagnosis not present

## 2017-07-29 DIAGNOSIS — C3412 Malignant neoplasm of upper lobe, left bronchus or lung: Secondary | ICD-10-CM

## 2017-07-29 DIAGNOSIS — I10 Essential (primary) hypertension: Secondary | ICD-10-CM | POA: Insufficient documentation

## 2017-07-29 DIAGNOSIS — Z794 Long term (current) use of insulin: Secondary | ICD-10-CM | POA: Insufficient documentation

## 2017-07-29 DIAGNOSIS — Z800 Family history of malignant neoplasm of digestive organs: Secondary | ICD-10-CM | POA: Insufficient documentation

## 2017-07-29 DIAGNOSIS — C778 Secondary and unspecified malignant neoplasm of lymph nodes of multiple regions: Secondary | ICD-10-CM | POA: Diagnosis not present

## 2017-07-29 DIAGNOSIS — Z66 Do not resuscitate: Secondary | ICD-10-CM | POA: Insufficient documentation

## 2017-07-29 LAB — CBC WITH DIFFERENTIAL (CANCER CENTER ONLY)
BASOS PCT: 1 %
Basophils Absolute: 0 10*3/uL (ref 0.0–0.1)
EOS ABS: 0.5 10*3/uL (ref 0.0–0.5)
Eosinophils Relative: 6 %
HEMATOCRIT: 31.1 % — AB (ref 34.8–46.6)
Hemoglobin: 10.4 g/dL — ABNORMAL LOW (ref 11.6–15.9)
LYMPHS ABS: 1.8 10*3/uL (ref 0.9–3.3)
Lymphocytes Relative: 22 %
MCH: 29.5 pg (ref 25.1–34.0)
MCHC: 33.4 g/dL (ref 31.5–36.0)
MCV: 88.4 fL (ref 79.5–101.0)
MONOS PCT: 1 %
Monocytes Absolute: 0.1 10*3/uL (ref 0.1–0.9)
NEUTROS ABS: 5.8 10*3/uL (ref 1.5–6.5)
NEUTROS PCT: 70 %
Platelet Count: 211 10*3/uL (ref 145–400)
RBC: 3.52 MIL/uL — AB (ref 3.70–5.45)
RDW: 14.4 % (ref 11.2–14.5)
WBC: 8.2 10*3/uL (ref 3.9–10.3)

## 2017-07-29 LAB — CMP (CANCER CENTER ONLY)
ALBUMIN: 3.2 g/dL — AB (ref 3.5–5.0)
ALK PHOS: 114 U/L (ref 40–150)
ALT: 19 U/L (ref 0–55)
ANION GAP: 9 (ref 3–11)
AST: 16 U/L (ref 5–34)
BILIRUBIN TOTAL: 0.4 mg/dL (ref 0.2–1.2)
BUN: 30 mg/dL — AB (ref 7–26)
CALCIUM: 9.2 mg/dL (ref 8.4–10.4)
CO2: 24 mmol/L (ref 22–29)
Chloride: 100 mmol/L (ref 98–109)
Creatinine: 0.82 mg/dL (ref 0.60–1.10)
GFR, Est AFR Am: >60 mL/min (ref >60)
GFR, Estimated: >60 mL/min (ref >60)
GLUCOSE: 163 mg/dL — AB (ref 70–140)
Potassium: 4.6 mmol/L (ref 3.5–5.1)
SODIUM: 133 mmol/L — AB (ref 136–145)
TOTAL PROTEIN: 6.9 g/dL (ref 6.4–8.3)

## 2017-07-29 MED ORDER — SODIUM CHLORIDE 0.9% FLUSH
10.0000 mL | Freq: Once | INTRAVENOUS | Status: AC
  Administered 2017-07-29: 10 mL
  Filled 2017-07-29: qty 10

## 2017-07-29 NOTE — Progress Notes (Signed)
Radiation Oncology         [phone] ________________________________  Name: Katherine Cole MRN: [id number]  Date: 07/29/2017  DOB: Mar 10, 1941  VZ:DGLO, Ravisankar, MD  Curt Bears, MD     REFERRING PHYSICIAN: Curt Bears, MD   DIAGNOSIS: The encounter diagnosis was Primary small cell carcinoma of left lung ([place]).   HISTORY OF PRESENT ILLNESS::Katherine Cole is a 77 y.o. female who is seen for an initial consultation visit. She is accompanied by her daughter, Katherine Cole. She has been doing well overall. The patient has started chemotherapy and has been tolerating it well. She has arthritis and endorses occasional arthralgias. The patient has been able to carryout her normal daily activities without any issues. In March 2019, she was diagnosed pneumonia, which has since resolved. Lateshia denies SOB, nausea, vomiting, pain, fever, chills or any other symptoms or complaints at this time.   PREVIOUS RADIATION THERAPY: No   PAST MEDICAL HISTORY:  has a past medical history of Arthritis, CAD (coronary artery disease), Colon polyps, Depression, Diabetes mellitus, type 2 ([place]), Hyperlipemia, Hypertension, Osteoarthritis, Pneumonia (06/2017), Skin cancer (2012), and Stroke [facility]).     PAST SURGICAL HISTORY: Past Surgical History:  Procedure Laterality Date  . APPENDECTOMY  ~ 1965  . CATARACT EXTRACTION    . CORONARY ANGIOPLASTY WITH STENT PLACEMENT  1999; 2004; 03/11/2012   "2 + 1 + 1; total of 4" (03/11/2012)  . FEMUR FRACTURE SURGERY  2011   RLE (03/11/2012)  . IR FLUORO GUIDE PORT INSERTION RIGHT  07/18/2017  . IR US GUIDE VASC ACCESS RIGHT  07/18/2017  . PERCUTANEOUS CORONARY STENT INTERVENTION (PCI-S) N/A 03/11/2012   Procedure: PERCUTANEOUS CORONARY STENT INTERVENTION (PCI-S);  Surgeon: Sherren Mocha, MD;  Location: [facility] CATH LAB;  Service: Cardiovascular;  Laterality: N/A;  . SKIN CANCER EXCISION  2013   "nose & right middle finger; actinic keratosis" (03/11/2012)  . TONSILLECTOMY  AND ADENOIDECTOMY  ~ 1962  . TUBAL LIGATION  1977  . VIDEO BRONCHOSCOPY WITH ENDOBRONCHIAL ULTRASOUND N/A 07/04/2017   Procedure: VIDEO BRONCHOSCOPY WITH ENDOBRONCHIAL ULTRASOUND;  Surgeon: Collene Gobble, MD;  Location: [facility] OR;  Service: Thoracic;  Laterality: N/A;     FAMILY HISTORY: family history includes Brain cancer in her father; Colon cancer (age of onset: 51) in her brother; Diabetes in her daughter; Liver cancer in her mother; Lung cancer in her brother and sister; Stomach cancer in her brother and father.   SOCIAL HISTORY:  reports that she has been smoking cigarettes.  She has a 51.00 pack-year smoking history. She has never used smokeless tobacco. She reports that she does not drink alcohol or use drugs.   ALLERGIES: Codeine and Morphine and related   MEDICATIONS:  Current Outpatient Medications  Medication Sig Dispense Refill  . ALPRAZolam (XANAX) 0.5 MG tablet Take 1 tablet (0.5 mg total) by mouth at bedtime as needed for anxiety or sleep. 30 tablet 0  . amLODipine (NORVASC) 5 MG tablet Take 1 tablet (5 mg total) by mouth daily. 30 tablet 1  . aspirin 81 MG tablet Take 1 tablet (81 mg total) by mouth daily. 30 tablet 1  . atorvastatin (LIPITOR) 80 MG tablet Take 80 mg by mouth daily.    . clopidogrel (PLAVIX) 75 MG tablet Take 75 mg by mouth daily.    Marland Kitchen HUMALOG KWIKPEN 100 UNIT/ML KiwkPen PLEASE SEE ATTACHED FOR DETAILED DIRECTIONS  2  . insulin glargine (LANTUS) 100 UNIT/ML injection Inject 0.08 mLs (8 Units total) into the skin at  bedtime. 10 mL 11  . JANUVIA 100 MG tablet Take 100 mg by mouth daily.    Marland Kitchen lidocaine-prilocaine (EMLA) cream Apply 1 application topically as needed. 30 g 0  . metFORMIN (GLUCOPHAGE) 1000 MG tablet Take 1,000 mg by mouth 2 (two) times daily with a meal.    . metoprolol succinate (TOPROL-XL) 50 MG 24 hr tablet Take 50 mg by mouth daily. Take with or immediately following a meal.    . guaiFENesin (MUCINEX) 600 MG 12 hr tablet Take 1 tablet (600  mg total) by mouth 2 (two) times daily. (Patient not taking: Reported on 07/29/2017) 20 tablet 1  . nitroGLYCERIN (NITROSTAT) 0.4 MG SL tablet Place 1 tablet (0.4 mg total) under the tongue every 5 (five) minutes as needed (up to 3 doses). For chest pain (Patient not taking: Reported on 07/29/2017)    . ondansetron (ZOFRAN ODT) 4 MG disintegrating tablet Take 1 tablet (4 mg total) by mouth every 8 (eight) hours as needed for nausea or vomiting. (Patient not taking: Reported on 07/29/2017) 20 tablet 0  . ondansetron (ZOFRAN) 4 MG tablet Take 1 tablet (4 mg total) by mouth every 8 (eight) hours as needed for nausea or vomiting. (Patient not taking: Reported on 07/29/2017) 20 tablet 0  . prochlorperazine (COMPAZINE) 10 MG tablet Take 1 tablet (10 mg total) by mouth every 6 (six) hours as needed for nausea or vomiting. (Patient not taking: Reported on 07/29/2017) 30 tablet 0   No current facility-administered medications for this encounter.      REVIEW OF SYSTEMS:  A 15 point review of systems is documented in the electronic medical record. This was obtained by the nursing staff. However, I reviewed this with the patient to discuss relevant findings and make appropriate changes.  Pertinent items noted in HPI and remainder of comprehensive ROS otherwise negative.    PHYSICAL EXAM:  height is 5\' 1"  (1.549 m) and weight is 80 lb 12.8 oz (36.7 kg). Her temperature is 97.9 F (36.6 C). Her blood pressure is 147/63 (abnormal) and her pulse is 65. Her respiration is 16 and oxygen saturation is 100%.    General: Well-developed, in no acute distress   ECOG = 1  LABORATORY DATA:  Lab Results  Component Value Date   WBC 8.2 07/29/2017   HGB 10.4 (L) 07/29/2017   HCT 31.1 (L) 07/29/2017   MCV 88.4 07/29/2017   PLT 211 07/29/2017   Lab Results  Component Value Date   NA 133 (L) 07/29/2017   K 4.6 07/29/2017   CL 100 07/29/2017   CO2 24 07/29/2017   Lab Results  Component Value Date   ALT 19  07/29/2017   AST 16 07/29/2017   ALKPHOS 114 07/29/2017   BILITOT 0.4 07/29/2017      RADIOGRAPHY: Dg Chest 2 View  Result Date: 06/30/2017 CLINICAL DATA:  Cough weakness vomiting and diarrhea EXAM: CHEST - 2 VIEW COMPARISON:  09/10/2014 FINDINGS: Hyperinflation. Ill-defined left upper lobe peripheral and parenchymal disease. Ill-defined opacity in the right upper lobe, possibly with cavitation. Normal heart size with aortic atherosclerosis. No pneumothorax. Degenerative changes of the spine. Asymmetric fullness of the left hilum. IMPRESSION: Hyperinflation with ill-defined upper lobe pulmonary opacity with mild pleural changes in the left upper lobe and possible cavitation of the lesion in the right upper lobe. Asymmetric fullness of the left hilum which may reflect nodes. CT chest recommended for further evaluation. Electronically Signed   By: Donavan Foil M.D.   On: 06/30/2017  18:20   Ct Chest W Contrast  Result Date: 06/30/2017 CLINICAL DATA:  77 year old female with cough and congestion. EXAM: CT CHEST WITH CONTRAST TECHNIQUE: Multidetector CT imaging of the chest was performed during intravenous contrast administration. CONTRAST:  72 milliliters ISOVUE-300 IOPAMIDOL (ISOVUE-300) INJECTION 61% COMPARISON:  Chest radiographs 1813 hours today and earlier. Chest CTA 02/09/2003 FINDINGS: Cardiovascular: Extensive calcified coronary artery atherosclerosis and/or stents. Less severe Calcified aortic atherosclerosis. No cardiomegaly. No pericardial effusion. The other major mediastinal vascular structures appear patent, although there is extrinsic mass effect on the left pulmonary artery branches, see below. Mediastinum/Nodes: Left hilar mass versus conglomerate metastatic nodal disease measuring up to 17 mm in thickness and encasing the left upper lobe airway (series 3, image 66). Bulky AP window lymphadenopathy measuring 18 mm short axis. Other mediastinal nodal stations and the right hilar lymph nodes  appear more normal. Lungs/Pleura: Pulmonary hyperinflation. Lobulated and spiculated up to 3.7 centimeter diameter masslike opacity along the left peripheral lung at the level of the hilum (series 3, image 50 and sagittal image 118) with no air bronchograms. Surrounding spiculation and septal thickening. Abnormal peribronchial soft tissue like nodularity tracking to the hilum as seen on series 5, image 60. These are in the left upper lobe. Superimposed mild peribronchial ground-glass nodularity in the left lower lobe. No left pleural effusion. In the right lung there is lateral upper lobe, lateral segment right middle lobe and right lower lobe peribronchial ground-glass nodularity. No right pleural effusion. Upper Abdomen: Negative visible liver, spleen, left adrenal gland, right adrenal gland, right kidney, and pancreas in the upper abdomen. Left kidney remarkable for calcified vascular disease or nephrolithiasis. There is air in fluid distending the stomach although there is superimposed gastric mucosal hyperenhancement (series 3, image 127 which is not particularly masslike. Musculoskeletal: Osteopenia. No rib destruction associated with the left lateral lung mass which borders the left lateral 4th and 5th ribs. No acute or suspicious osseous lesion identified in the chest. IMPRESSION: 1. 3.7 cm spiculated mass in the lateral periphery of the left upper lobe at the level of the hilum is Bronchogenic Carcinoma until proven otherwise. Associated with abnormal peribronchial soft tissue nodularity tracking to the left hilum and bulky, malignant appearing left hilar and AP window lymphadenopathy. 2. Underlying pulmonary hyperinflation and superimposed multilobar bilateral peribronchial ground-glass nodularity compatible with a Superimposed Acute Viral/atypical Respiratory Infection. No pleural effusion. 3. Nonspecific gastric fundal mucosal hyperenhancement, perhaps acute gastritis. 4. Coronary artery and Aortic  Atherosclerosis (ICD10-I70.0). Electronically Signed   By: Genevie Ann M.D.   On: 06/30/2017 22:38   Mr Jeri Cos HD Contrast  Result Date: 07/15/2017 CLINICAL DATA:  Small cell lung cancer. Tremors. Difficulty walking and dizziness. Staging. EXAM: MRI HEAD WITHOUT AND WITH CONTRAST TECHNIQUE: Multiplanar, multiecho pulse sequences of the brain and surrounding structures were obtained without and with intravenous contrast. CONTRAST:  59mL MULTIHANCE GADOBENATE DIMEGLUMINE 529 MG/ML IV SOLN COMPARISON:  None. FINDINGS: Brain: Diffusion imaging does not show any acute or subacute infarction. There chronic small-vessel ischemic changes of the pons. No focal cerebellar insult. Cerebral hemispheres show old small vessel infarctions of the thalami and basal ganglia and mild chronic small-vessel change of the hemispheric white matter. No cortical or large vessel territory infarction. No evidence of mass lesion, hemorrhage, hydrocephalus or extra-axial collection. No abnormal enhancement occurs. Vascular: Major vessels at the base of the brain show flow. Skull and upper cervical spine: Negative Sinuses/Orbits: Clear/normal Other: None IMPRESSION: No evidence of metastatic disease. Chronic small-vessel ischemic  changes throughout the brain as outlined above. Electronically Signed   By: Nelson Chimes M.D.   On: 07/15/2017 09:28   Nm Pet Image Initial (pi) Skull Base To Thigh  Result Date: 07/17/2017 CLINICAL DATA:  Initial treatment strategy for pulmonary nodule. EXAM: NUCLEAR MEDICINE PET SKULL BASE TO THIGH TECHNIQUE: 5.8 mCi F-18 FDG was injected intravenously. Full-ring PET imaging was performed from the skull base to thigh after the radiotracer. CT data was obtained and used for attenuation correction and anatomic localization. Fasting blood glucose: 73 mg/dl COMPARISON:  CT chest 06/30/2017 FINDINGS: Mediastinal blood pool activity: SUV max 1.6 NECK: No hypermetabolic lymph nodes in the neck. Incidental CT findings: None  CHEST: Enlarged and hypermetabolic left-sided mediastinal and hilar lymph nodes. Index left AP window lymph node measures 1.7 cm and has an SUV max equal to 11.48. Left hilar nodal mass measures 4.4 cm and has an SUV max equal to 11.18. Within the periphery of the left upper lobe there is a subpleural mass measuring 4.2 cm within SUV max equal to 10.8. Incidental CT findings: Aortic atherosclerosis. Calcifications within the LAD, left circumflex and RCA coronary arteries. ABDOMEN/PELVIS: 1.4 cm lesion within segment 4 of the liver measures 1.4 cm and has an SUV max equal to 5.0. No additional hypermetabolic liver lesions. No abnormal uptake within the adrenal glands, pancreas or spleen. Incidental CT findings: Aortic atherosclerosis. Infrarenal abdominal aortic ectasia measures 2.8 cm, image 159/3. Nonobstructing left renal calculus measures 7 mm. SKELETON: Hypermetabolic lesions within the central sacrum has an SUV max equal to 5.79. A second hypermetabolic lesion within the posterior column of the right acetabulum has an SUV max equal to 5.2. Incidental CT findings: No suspicious lytic or sclerotic bone lesions identified on the corresponding CT images. Chronic healed right inferior pubic rami fracture noted. IMPRESSION: 1. Subpleural mass within the left upper lobe is intensely hypermetabolic compatible with primary bronchogenic carcinoma. There is associated ipsilateral hilar and mediastinal nodal metastasis, liver metastasis and bone metastasis. 2.  Aortic Atherosclerosis (ICD10-I70.0). 3. 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 Recommend followup by ultrasound in 5 years. This recommendation follows ACR consensus guidelines: White Paper of the ACR Incidental Findings Committee II on Vascular Findings. J Am Coll Radiol 2013; 10:789-794. 4. Three vessel coronary artery calcifications noted. Electronically Signed   By: Kerby Moors M.D.   On: 07/17/2017 14:49   Ir US Guide Vasc Access  Right  Result Date: 07/18/2017 INDICATION: 77 year old female with a history of lung carcinoma EXAM: IMPLANTED PORT A CATH PLACEMENT WITH ULTRASOUND AND FLUOROSCOPIC GUIDANCE MEDICATIONS: 2.0 g Ancef; The antibiotic was administered within an appropriate time interval prior to skin puncture. ANESTHESIA/SEDATION: Moderate (conscious) sedation was employed during this procedure. A total of Versed 2.0 mg and Fentanyl 100 mcg was administered intravenously. Moderate Sedation Time: 21 minutes. The patient's level of consciousness and vital signs were monitored continuously by radiology nursing throughout the procedure under my direct supervision. FLUOROSCOPY TIME:  0 minutes, 6 seconds (1 mGy) COMPLICATIONS: None PROCEDURE: The procedure, risks, benefits, and alternatives were explained to the patient. Questions regarding the procedure were encouraged and answered. The patient understands and consents to the procedure. Ultrasound survey was performed with images stored and sent to PACs. The right neck and chest was prepped with chlorhexidine, and draped in the usual sterile fashion using maximum barrier technique (cap and mask, sterile gown, sterile gloves, large sterile sheet, hand hygiene and cutaneous antiseptic). Antibiotic prophylaxis was provided with 2.0g Ancef administered IV  one hour prior to skin incision. Local anesthesia was attained by infiltration with 1% lidocaine without epinephrine. Ultrasound demonstrated patency of the right internal jugular vein, and this was documented with an image. Under real-time ultrasound guidance, this vein was accessed with a 21 gauge micropuncture needle and image documentation was performed. A small dermatotomy was made at the access site with an 11 scalpel. A 0.018" wire was advanced into the SVC and used to estimate the length of the internal catheter. The access needle exchanged for a 22F micropuncture vascular sheath. The 0.018" wire was then removed and a 0.035" wire  advanced into the IVC. An appropriate location for the subcutaneous reservoir was selected below the clavicle and an incision was made through the skin and underlying soft tissues. The subcutaneous tissues were then dissected using a combination of blunt and sharp surgical technique and a pocket was formed. A single lumen power injectable portacatheter was then tunneled through the subcutaneous tissues from the pocket to the dermatotomy and the port reservoir placed within the subcutaneous pocket. The venous access site was then serially dilated and a peel away vascular sheath placed over the wire. The wire was removed and the port catheter advanced into position under fluoroscopic guidance. The catheter tip is positioned in the cavoatrial junction. This was documented with a spot image. The portacatheter was then tested and found to flush and aspirate well. The port was flushed with saline followed by 100 units/mL heparinized saline. The pocket was then closed in two layers using first subdermal inverted interrupted absorbable sutures followed by a running subcuticular suture. The epidermis was then sealed with Dermabond. The dermatotomy at the venous access site was also seal with Dermabond. Patient tolerated the procedure well and remained hemodynamically stable throughout. No complications encountered and no significant blood loss encountered IMPRESSION: Status post right IJ port catheter placement. Catheter ready for use. Signed, Dulcy Fanny. Earleen Newport, DO [facility] Radiology Electronically Signed   By: Corrie Mckusick D.O.   On: 07/18/2017 11:00   Ir Fluoro Guide Port Insertion Right  Result Date: 07/18/2017 INDICATION: 77 year old female with a history of lung carcinoma EXAM: IMPLANTED PORT A CATH PLACEMENT WITH ULTRASOUND AND FLUOROSCOPIC GUIDANCE MEDICATIONS: 2.0 g Ancef; The antibiotic was administered within an appropriate time interval prior to skin puncture.  ANESTHESIA/SEDATION: Moderate (conscious) sedation was employed during this procedure. A total of Versed 2.0 mg and Fentanyl 100 mcg was administered intravenously. Moderate Sedation Time: 21 minutes. The patient's level of consciousness and vital signs were monitored continuously by radiology nursing throughout the procedure under my direct supervision. FLUOROSCOPY TIME:  0 minutes, 6 seconds (1 mGy) COMPLICATIONS: None PROCEDURE: The procedure, risks, benefits, and alternatives were explained to the patient. Questions regarding the procedure were encouraged and answered. The patient understands and consents to the procedure. Ultrasound survey was performed with images stored and sent to PACs. The right neck and chest was prepped with chlorhexidine, and draped in the usual sterile fashion using maximum barrier technique (cap and mask, sterile gown, sterile gloves, large sterile sheet, hand hygiene and cutaneous antiseptic). Antibiotic prophylaxis was provided with 2.0g Ancef administered IV one hour prior to skin incision. Local anesthesia was attained by infiltration with 1% lidocaine without epinephrine. Ultrasound demonstrated patency of the right internal jugular vein, and this was documented with an image. Under real-time ultrasound guidance, this vein was accessed with a 21 gauge micropuncture needle and image documentation was performed. A small dermatotomy was made at the  access site with an 11 scalpel. A 0.018" wire was advanced into the SVC and used to estimate the length of the internal catheter. The access needle exchanged for a 26F micropuncture vascular sheath. The 0.018" wire was then removed and a 0.035" wire advanced into the IVC. An appropriate location for the subcutaneous reservoir was selected below the clavicle and an incision was made through the skin and underlying soft tissues. The subcutaneous tissues were then dissected using a combination of blunt and sharp surgical technique and a pocket  was formed. A single lumen power injectable portacatheter was then tunneled through the subcutaneous tissues from the pocket to the dermatotomy and the port reservoir placed within the subcutaneous pocket. The venous access site was then serially dilated and a peel away vascular sheath placed over the wire. The wire was removed and the port catheter advanced into position under fluoroscopic guidance. The catheter tip is positioned in the cavoatrial junction. This was documented with a spot image. The portacatheter was then tested and found to flush and aspirate well. The port was flushed with saline followed by 100 units/mL heparinized saline. The pocket was then closed in two layers using first subdermal inverted interrupted absorbable sutures followed by a running subcuticular suture. The epidermis was then sealed with Dermabond. The dermatotomy at the venous access site was also seal with Dermabond. Patient tolerated the procedure well and remained hemodynamically stable throughout. No complications encountered and no significant blood loss encountered IMPRESSION: Status post right IJ port catheter placement. Catheter ready for use. Signed, Dulcy Fanny. Earleen Newport, DO [facility] Radiology Electronically Signed   By: Corrie Mckusick D.O.   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  Unfortunately, distant disease was seen on her recent PET scan.  She is proceeding with chemotherapy for what now has been staged as extensive stage disease.  The patient denies any significant difficulties in terms of shortness of breath, pain or other significant ongoing symptoms related to her disease at this time.  It does not appear therefore that palliative radiation treatment needs to proceed currently.  I discussed  with the patient a possible role for prophylactic cranial irradiation at the appropriate time.  We also discussed the possible benefit for consolidative thoracic radiation treatment which could be given concurrently with whole brain treatment.  We also discussed additional sites could be treated in a palliative manner if necessary.  We discussed all of these details as well as the logistics of possible radiation treatment.  All of their questions were answered and I look forward to seeing the patient at the appropriate time, likely after the patient has completed chemotherapy and undergone restaging studies.   I spent 40 minutes face to face with the patient and more than 50% of that time was spent in counseling and/or coordination of care.    ________________________________   Jodelle Gross, MD, PhD   **Disclaimer: This note was dictated with voice recognition software. Similar sounding words can inadvertently be transcribed and this note may contain transcription errors which may not have been corrected upon publication of note.**  This document serves as a record of services personally performed by Kyung Rudd, MD. It was created on his behalf by Margit Banda, a trained medical scribe. The creation of this record is  based on the scribe's personal observations and the provider's statements to them. This document has been checked and approved by the attending provider.

## 2017-07-29 NOTE — Telephone Encounter (Signed)
Appointments scheduled AVS/Calendar printed per 4/23 los

## 2017-07-29 NOTE — Progress Notes (Signed)
[place] [phone]   [phone]  OFFICE PROGRESS NOTE  Prince Solian, MD [place] [address]  DIAGNOSIS: Extensive stage (T2a, N2, M1c) small cell lung cancer presented with subpleural mass within the left upper lobe with associated ipsilateral hilar and mediastinal nodal metastasis, liver metastasis and bone metastasis  PRIOR THERAPY:None  CURRENT THERAPY: Systemic chemotherapy with carboplatin for AUC of 5 on day 1 and etoposide 100 mg/M2 on days 1, 2 and 3 with Neulasta support every 3 weeks  INTERVAL HISTORY: Katherine Cole 77 y.o. female returns to the clinic today for follow-up visit accompanied by her daughter.  The patient tolerated the first week of her treatment with carboplatin and etoposide fairly well.  She had several imaging studies performed since her last visit including MRI of the brain that was negative for metastasis.  She also had a PET scan that showed the left upper lobe lung mass with associated ipsilateral hilar and mediastinal nodal metastasis in addition to liver and bone metastasis.  She denied having any chest pain, shortness of breath, cough or hemoptysis.  She denied having any fever or chills.  She has no nausea, vomiting, diarrhea or constipation.  She is here today for evaluation after the first week of her treatment.  MEDICAL HISTORY: Past Medical History:  Diagnosis Date  . Arthritis    "in my fingers"   . CAD (coronary artery disease)    a. MI 1999 with 2 stents at that time followed by stenting 3-4 yrs later. b. s/p DES to LAD guided by pressure wire 03/11/12.  . Colon polyps    adenomatous  . Depression   . Diabetes mellitus, type 2 ([place])   . GERD (gastroesophageal reflux disease)   . Hyperlipemia   . Hypertension   . Osteoarthritis   . Pneumonia 06/2017  . Skin cancer 2012   "nose; right middle finger"  . Stroke [facility])     ALLERGIES:  is allergic to codeine and morphine and  related.  MEDICATIONS:  Current Outpatient Medications  Medication Sig Dispense Refill  . acetaminophen (TYLENOL) 325 MG tablet Take 2 tablets (650 mg total) by mouth every 6 (six) hours as needed for mild pain (or Fever >/= 101). 20 tablet 0  . ALPRAZolam (XANAX) 0.5 MG tablet Take 1 tablet (0.5 mg total) by mouth at bedtime as needed for anxiety or sleep. 30 tablet 0  . amLODipine (NORVASC) 5 MG tablet Take 1 tablet (5 mg total) by mouth daily. 30 tablet 1  . aspirin 81 MG tablet Take 1 tablet (81 mg total) by mouth daily. 30 tablet 1  . atorvastatin (LIPITOR) 80 MG tablet Take 80 mg by mouth daily.    . clopidogrel (PLAVIX) 75 MG tablet Take 75 mg by mouth daily.    Marland Kitchen guaiFENesin (MUCINEX) 600 MG 12 hr tablet Take 1 tablet (600 mg total) by mouth 2 (two) times daily. 20 tablet 1  . HYDROcodone-acetaminophen (NORCO) 5-325 MG tablet Take 1 tablet by mouth every 6 (six) hours as needed for moderate pain. 15 tablet 0  . insulin glargine (LANTUS) 100 UNIT/ML injection Inject 0.08 mLs (8 Units total) into the skin at bedtime. 10 mL 11  . JANUVIA 100 MG tablet Take 100 mg by mouth daily.    Marland Kitchen lidocaine-prilocaine (EMLA) cream Apply 1 application topically as needed. 30 g 0  . metFORMIN (GLUCOPHAGE) 1000 MG tablet Take 1,000 mg by mouth 2 (two) times  daily with a meal.    . metoprolol succinate (TOPROL-XL) 50 MG 24 hr tablet Take 50 mg by mouth daily. Take with or immediately following a meal.    . nitroGLYCERIN (NITROSTAT) 0.4 MG SL tablet Place 1 tablet (0.4 mg total) under the tongue every 5 (five) minutes as needed (up to 3 doses). For chest pain    . ondansetron (ZOFRAN ODT) 4 MG disintegrating tablet Take 1 tablet (4 mg total) by mouth every 8 (eight) hours as needed for nausea or vomiting. 20 tablet 0  . ondansetron (ZOFRAN) 4 MG tablet Take 1 tablet (4 mg total) by mouth every 8 (eight) hours as needed for nausea or vomiting. (Patient not taking: Reported on 06/30/2017) 20 tablet 0  .  prochlorperazine (COMPAZINE) 10 MG tablet Take 1 tablet (10 mg total) by mouth every 6 (six) hours as needed for nausea or vomiting. 30 tablet 0   No current facility-administered medications for this visit.     SURGICAL HISTORY:  Past Surgical History:  Procedure Laterality Date  . APPENDECTOMY  ~ 1965  . CATARACT EXTRACTION    . CORONARY ANGIOPLASTY WITH STENT PLACEMENT  1999; 2004; 03/11/2012   "2 + 1 + 1; total of 4" (03/11/2012)  . FEMUR FRACTURE SURGERY  2011   RLE (03/11/2012)  . IR FLUORO GUIDE PORT INSERTION RIGHT  07/18/2017  . IR US GUIDE VASC ACCESS RIGHT  07/18/2017  . PERCUTANEOUS CORONARY STENT INTERVENTION (PCI-S) N/A 03/11/2012   Procedure: PERCUTANEOUS CORONARY STENT INTERVENTION (PCI-S);  Surgeon: Sherren Mocha, MD;  Location: [facility] CATH LAB;  Service: Cardiovascular;  Laterality: N/A;  . SKIN CANCER EXCISION  2013   "nose & right middle finger; actinic keratosis" (03/11/2012)  . TONSILLECTOMY AND ADENOIDECTOMY  ~ 1962  . TUBAL LIGATION  1977  . VIDEO BRONCHOSCOPY WITH ENDOBRONCHIAL ULTRASOUND N/A 07/04/2017   Procedure: VIDEO BRONCHOSCOPY WITH ENDOBRONCHIAL ULTRASOUND;  Surgeon: Collene Gobble, MD;  Location: [facility] OR;  Service: Thoracic;  Laterality: N/A;    REVIEW OF SYSTEMS:  A comprehensive review of systems was negative except for: Constitutional: positive for fatigue and weight loss   PHYSICAL EXAMINATION: General appearance: alert, cooperative, fatigued and no distress Head: Normocephalic, without obvious abnormality, atraumatic Neck: no adenopathy, no JVD, supple, symmetrical, trachea midline and thyroid not enlarged, symmetric, no tenderness/mass/nodules Lymph nodes: Cervical, supraclavicular, and axillary nodes normal. Resp: clear to auscultation bilaterally Back: symmetric, no curvature. ROM normal. No CVA tenderness. Cardio: regular rate and rhythm, S1, S2 normal, no murmur, click, rub or gallop GI: soft, non-tender; bowel sounds normal; no masses,  no  organomegaly Extremities: extremities normal, atraumatic, no cyanosis or edema  ECOG PERFORMANCE STATUS: 1 - Symptomatic but completely ambulatory  Blood pressure (!) 166/49, pulse 62, temperature (!) 97.5 F (36.4 C), temperature source Oral, resp. rate 18, height 5\' 1"  (1.549 m), weight 81 lb 1.6 oz (36.8 kg), SpO2 100 %.  LABORATORY DATA: Lab Results  Component Value Date   WBC 8.2 07/29/2017   HGB 10.4 (L) 07/29/2017   HCT 31.1 (L) 07/29/2017   MCV 88.4 07/29/2017   PLT 211 07/29/2017      Chemistry      Component Value Date/Time   NA 133 (L) 07/22/2017 1053   K 4.6 07/22/2017 1053   CL 99 07/22/2017 1053   CO2 26 07/22/2017 1053   BUN 18 07/22/2017 1053   CREATININE 0.89 07/22/2017 1053      Component Value Date/Time   CALCIUM 9.4 07/22/2017 1053  ALKPHOS 115 07/22/2017 1053   AST 15 07/22/2017 1053   ALT 12 07/22/2017 1053   BILITOT 0.3 07/22/2017 1053       RADIOGRAPHIC STUDIES: Dg Chest 2 View  Result Date: 06/30/2017 CLINICAL DATA:  Cough weakness vomiting and diarrhea EXAM: CHEST - 2 VIEW COMPARISON:  09/10/2014 FINDINGS: Hyperinflation. Ill-defined left upper lobe peripheral and parenchymal disease. Ill-defined opacity in the right upper lobe, possibly with cavitation. Normal heart size with aortic atherosclerosis. No pneumothorax. Degenerative changes of the spine. Asymmetric fullness of the left hilum. IMPRESSION: Hyperinflation with ill-defined upper lobe pulmonary opacity with mild pleural changes in the left upper lobe and possible cavitation of the lesion in the right upper lobe. Asymmetric fullness of the left hilum which may reflect nodes. CT chest recommended for further evaluation. Electronically Signed   By: Donavan Foil M.D.   On: 06/30/2017 18:20   Ct Chest W Contrast  Result Date: 06/30/2017 CLINICAL DATA:  77 year old female with cough and congestion. EXAM: CT CHEST WITH CONTRAST TECHNIQUE: Multidetector CT imaging of the chest was performed  during intravenous contrast administration. CONTRAST:  72 milliliters ISOVUE-300 IOPAMIDOL (ISOVUE-300) INJECTION 61% COMPARISON:  Chest radiographs 1813 hours today and earlier. Chest CTA 02/09/2003 FINDINGS: Cardiovascular: Extensive calcified coronary artery atherosclerosis and/or stents. Less severe Calcified aortic atherosclerosis. No cardiomegaly. No pericardial effusion. The other major mediastinal vascular structures appear patent, although there is extrinsic mass effect on the left pulmonary artery branches, see below. Mediastinum/Nodes: Left hilar mass versus conglomerate metastatic nodal disease measuring up to 17 mm in thickness and encasing the left upper lobe airway (series 3, image 66). Bulky AP window lymphadenopathy measuring 18 mm short axis. Other mediastinal nodal stations and the right hilar lymph nodes appear more normal. Lungs/Pleura: Pulmonary hyperinflation. Lobulated and spiculated up to 3.7 centimeter diameter masslike opacity along the left peripheral lung at the level of the hilum (series 3, image 50 and sagittal image 118) with no air bronchograms. Surrounding spiculation and septal thickening. Abnormal peribronchial soft tissue like nodularity tracking to the hilum as seen on series 5, image 60. These are in the left upper lobe. Superimposed mild peribronchial ground-glass nodularity in the left lower lobe. No left pleural effusion. In the right lung there is lateral upper lobe, lateral segment right middle lobe and right lower lobe peribronchial ground-glass nodularity. No right pleural effusion. Upper Abdomen: Negative visible liver, spleen, left adrenal gland, right adrenal gland, right kidney, and pancreas in the upper abdomen. Left kidney remarkable for calcified vascular disease or nephrolithiasis. There is air in fluid distending the stomach although there is superimposed gastric mucosal hyperenhancement (series 3, image 127 which is not particularly masslike. Musculoskeletal:  Osteopenia. No rib destruction associated with the left lateral lung mass which borders the left lateral 4th and 5th ribs. No acute or suspicious osseous lesion identified in the chest. IMPRESSION: 1. 3.7 cm spiculated mass in the lateral periphery of the left upper lobe at the level of the hilum is Bronchogenic Carcinoma until proven otherwise. Associated with abnormal peribronchial soft tissue nodularity tracking to the left hilum and bulky, malignant appearing left hilar and AP window lymphadenopathy. 2. Underlying pulmonary hyperinflation and superimposed multilobar bilateral peribronchial ground-glass nodularity compatible with a Superimposed Acute Viral/atypical Respiratory Infection. No pleural effusion. 3. Nonspecific gastric fundal mucosal hyperenhancement, perhaps acute gastritis. 4. Coronary artery and Aortic Atherosclerosis (ICD10-I70.0). Electronically Signed   By: Genevie Ann M.D.   On: 06/30/2017 22:38   Mr Jeri Cos WU Contrast  Result Date:  07/15/2017 CLINICAL DATA:  Small cell lung cancer. Tremors. Difficulty walking and dizziness. Staging. EXAM: MRI HEAD WITHOUT AND WITH CONTRAST TECHNIQUE: Multiplanar, multiecho pulse sequences of the brain and surrounding structures were obtained without and with intravenous contrast. CONTRAST:  75mL MULTIHANCE GADOBENATE DIMEGLUMINE 529 MG/ML IV SOLN COMPARISON:  None. FINDINGS: Brain: Diffusion imaging does not show any acute or subacute infarction. There chronic small-vessel ischemic changes of the pons. No focal cerebellar insult. Cerebral hemispheres show old small vessel infarctions of the thalami and basal ganglia and mild chronic small-vessel change of the hemispheric white matter. No cortical or large vessel territory infarction. No evidence of mass lesion, hemorrhage, hydrocephalus or extra-axial collection. No abnormal enhancement occurs. Vascular: Major vessels at the base of the brain show flow. Skull and upper cervical spine: Negative Sinuses/Orbits:  Clear/normal Other: None IMPRESSION: No evidence of metastatic disease. Chronic small-vessel ischemic changes throughout the brain as outlined above. Electronically Signed   By: Nelson Chimes M.D.   On: 07/15/2017 09:28   Nm Pet Image Initial (pi) Skull Base To Thigh  Result Date: 07/17/2017 CLINICAL DATA:  Initial treatment strategy for pulmonary nodule. EXAM: NUCLEAR MEDICINE PET SKULL BASE TO THIGH TECHNIQUE: 5.8 mCi F-18 FDG was injected intravenously. Full-ring PET imaging was performed from the skull base to thigh after the radiotracer. CT data was obtained and used for attenuation correction and anatomic localization. Fasting blood glucose: 73 mg/dl COMPARISON:  CT chest 06/30/2017 FINDINGS: Mediastinal blood pool activity: SUV max 1.6 NECK: No hypermetabolic lymph nodes in the neck. Incidental CT findings: None CHEST: Enlarged and hypermetabolic left-sided mediastinal and hilar lymph nodes. Index left AP window lymph node measures 1.7 cm and has an SUV max equal to 11.48. Left hilar nodal mass measures 4.4 cm and has an SUV max equal to 11.18. Within the periphery of the left upper lobe there is a subpleural mass measuring 4.2 cm within SUV max equal to 10.8. Incidental CT findings: Aortic atherosclerosis. Calcifications within the LAD, left circumflex and RCA coronary arteries. ABDOMEN/PELVIS: 1.4 cm lesion within segment 4 of the liver measures 1.4 cm and has an SUV max equal to 5.0. No additional hypermetabolic liver lesions. No abnormal uptake within the adrenal glands, pancreas or spleen. Incidental CT findings: Aortic atherosclerosis. Infrarenal abdominal aortic ectasia measures 2.8 cm, image 159/3. Nonobstructing left renal calculus measures 7 mm. SKELETON: Hypermetabolic lesions within the central sacrum has an SUV max equal to 5.79. A second hypermetabolic lesion within the posterior column of the right acetabulum has an SUV max equal to 5.2. Incidental CT findings: No suspicious lytic or  sclerotic bone lesions identified on the corresponding CT images. Chronic healed right inferior pubic rami fracture noted. IMPRESSION: 1. Subpleural mass within the left upper lobe is intensely hypermetabolic compatible with primary bronchogenic carcinoma. There is associated ipsilateral hilar and mediastinal nodal metastasis, liver metastasis and bone metastasis. 2.  Aortic Atherosclerosis (ICD10-I70.0). 3. 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 Recommend followup by ultrasound in 5 years. This recommendation follows ACR consensus guidelines: White Paper of the ACR Incidental Findings Committee II on Vascular Findings. J Am Coll Radiol 2013; 10:789-794. 4. Three vessel coronary artery calcifications noted. Electronically Signed   By: Kerby Moors M.D.   On: 07/17/2017 14:49   Ir US Guide Vasc Access Right  Result Date: 07/18/2017 INDICATION: 77 year old female with a history of lung carcinoma EXAM: IMPLANTED PORT A CATH PLACEMENT WITH ULTRASOUND AND FLUOROSCOPIC GUIDANCE MEDICATIONS: 2.0 g Ancef; The antibiotic was administered within  an appropriate time interval prior to skin puncture. ANESTHESIA/SEDATION: Moderate (conscious) sedation was employed during this procedure. A total of Versed 2.0 mg and Fentanyl 100 mcg was administered intravenously. Moderate Sedation Time: 21 minutes. The patient's level of consciousness and vital signs were monitored continuously by radiology nursing throughout the procedure under my direct supervision. FLUOROSCOPY TIME:  0 minutes, 6 seconds (1 mGy) COMPLICATIONS: None PROCEDURE: The procedure, risks, benefits, and alternatives were explained to the patient. Questions regarding the procedure were encouraged and answered. The patient understands and consents to the procedure. Ultrasound survey was performed with images stored and sent to PACs. The right neck and chest was prepped with chlorhexidine, and draped in the usual sterile fashion using maximum  barrier technique (cap and mask, sterile gown, sterile gloves, large sterile sheet, hand hygiene and cutaneous antiseptic). Antibiotic prophylaxis was provided with 2.0g Ancef administered IV one hour prior to skin incision. Local anesthesia was attained by infiltration with 1% lidocaine without epinephrine. Ultrasound demonstrated patency of the right internal jugular vein, and this was documented with an image. Under real-time ultrasound guidance, this vein was accessed with a 21 gauge micropuncture needle and image documentation was performed. A small dermatotomy was made at the access site with an 11 scalpel. A 0.018" wire was advanced into the SVC and used to estimate the length of the internal catheter. The access needle exchanged for a 60F micropuncture vascular sheath. The 0.018" wire was then removed and a 0.035" wire advanced into the IVC. An appropriate location for the subcutaneous reservoir was selected below the clavicle and an incision was made through the skin and underlying soft tissues. The subcutaneous tissues were then dissected using a combination of blunt and sharp surgical technique and a pocket was formed. A single lumen power injectable portacatheter was then tunneled through the subcutaneous tissues from the pocket to the dermatotomy and the port reservoir placed within the subcutaneous pocket. The venous access site was then serially dilated and a peel away vascular sheath placed over the wire. The wire was removed and the port catheter advanced into position under fluoroscopic guidance. The catheter tip is positioned in the cavoatrial junction. This was documented with a spot image. The portacatheter was then tested and found to flush and aspirate well. The port was flushed with saline followed by 100 units/mL heparinized saline. The pocket was then closed in two layers using first subdermal inverted interrupted absorbable sutures followed by a running subcuticular suture. The epidermis  was then sealed with Dermabond. The dermatotomy at the venous access site was also seal with Dermabond. Patient tolerated the procedure well and remained hemodynamically stable throughout. No complications encountered and no significant blood loss encountered IMPRESSION: Status post right IJ port catheter placement. Catheter ready for use. Signed, Dulcy Fanny. Earleen Newport, DO [facility] Electronically Signed   By: Corrie Mckusick D.O.   On: 07/18/2017 11:00   Ir Fluoro Guide Port Insertion Right  Result Date: 07/18/2017 INDICATION: 77 year old female with a history of lung carcinoma EXAM: IMPLANTED PORT A CATH PLACEMENT WITH ULTRASOUND AND FLUOROSCOPIC GUIDANCE MEDICATIONS: 2.0 g Ancef; The antibiotic was administered within an appropriate time interval prior to skin puncture. ANESTHESIA/SEDATION: Moderate (conscious) sedation was employed during this procedure. A total of Versed 2.0 mg and Fentanyl 100 mcg was administered intravenously. Moderate Sedation Time: 21 minutes. The patient's level of consciousness and vital signs were monitored continuously by radiology nursing throughout the procedure under my direct supervision. FLUOROSCOPY TIME:  0 minutes, 6 seconds (1 mGy) COMPLICATIONS: None PROCEDURE: The procedure, risks, benefits, and alternatives were explained to the patient. Questions regarding the procedure were encouraged and answered. The patient understands and consents to the procedure. Ultrasound survey was performed with images stored and sent to PACs. The right neck and chest was prepped with chlorhexidine, and draped in the usual sterile fashion using maximum barrier technique (cap and mask, sterile gown, sterile gloves, large sterile sheet, hand hygiene and cutaneous antiseptic). Antibiotic prophylaxis was provided with 2.0g Ancef administered IV one hour prior to skin incision. Local anesthesia was attained by infiltration with 1% lidocaine  without epinephrine. Ultrasound demonstrated patency of the right internal jugular vein, and this was documented with an image. Under real-time ultrasound guidance, this vein was accessed with a 21 gauge micropuncture needle and image documentation was performed. A small dermatotomy was made at the access site with an 11 scalpel. A 0.018" wire was advanced into the SVC and used to estimate the length of the internal catheter. The access needle exchanged for a 75F micropuncture vascular sheath. The 0.018" wire was then removed and a 0.035" wire advanced into the IVC. An appropriate location for the subcutaneous reservoir was selected below the clavicle and an incision was made through the skin and underlying soft tissues. The subcutaneous tissues were then dissected using a combination of blunt and sharp surgical technique and a pocket was formed. A single lumen power injectable portacatheter was then tunneled through the subcutaneous tissues from the pocket to the dermatotomy and the port reservoir placed within the subcutaneous pocket. The venous access site was then serially dilated and a peel away vascular sheath placed over the wire. The wire was removed and the port catheter advanced into position under fluoroscopic guidance. The catheter tip is positioned in the cavoatrial junction. This was documented with a spot image. The portacatheter was then tested and found to flush and aspirate well. The port was flushed with saline followed by 100 units/mL heparinized saline. The pocket was then closed in two layers using first subdermal inverted interrupted absorbable sutures followed by a running subcuticular suture. The epidermis was then sealed with Dermabond. The dermatotomy at the venous access site was also seal with Dermabond. Patient tolerated the procedure well and remained hemodynamically stable throughout. No complications encountered and no significant blood loss encountered IMPRESSION: Status post right  IJ port catheter placement. Catheter ready for use. Signed, Dulcy Fanny. Earleen Newport, DO [facility] Electronically Signed   By: Corrie Mckusick D.O.   On: 07/18/2017 11:00    ASSESSMENT AND PLAN: This is a very pleasant 77 years old white female recently diagnosed with extensive stage small cell lung cancer and currently undergoing systemic chemotherapy with carboplatin and etoposide status post 1 cycle given last week.  The patient tolerated the first week of her treatment well with no concerning complaints.  I recommended for her to continue on the same treatment and she is expected to start cycle #2 and 2 weeks. The patient requested no CODE BLUE status.  She was also given DNR form today. She will come back for follow-up visit in 2 weeks for evaluation before the next cycle of her treatment. For hypertension she would discuss with her primary care physician adjusting her blood pressure medication. For the malnutrition and weight loss she is followed by the dietitian at the cancer center. The patient was advised to call immediately if she has any concerning symptoms in the interval.  The patient voices understanding of current disease status and treatment options and is in agreement with the current care plan.  All questions were answered. The patient knows to call the clinic with any problems, questions or concerns. We can certainly see the patient much sooner if necessary.  I spent 10 minutes counseling the patient face to face. The total time spent in the appointment was 15 minutes.  Disclaimer: This note was dictated with voice recognition software. Similar sounding words can inadvertently be transcribed and may not be corrected upon review.

## 2017-08-02 ENCOUNTER — Inpatient Hospital Stay (HOSPITAL_COMMUNITY)
Admission: EM | Admit: 2017-08-02 | Discharge: 2017-08-10 | DRG: 871 | Disposition: A | Discharge status: Home Health | Payer: Medicare Other | Attending: Internal Medicine | Admitting: Internal Medicine

## 2017-08-02 ENCOUNTER — Other Ambulatory Visit: Payer: Self-pay

## 2017-08-02 ENCOUNTER — Encounter (HOSPITAL_COMMUNITY): Payer: Self-pay

## 2017-08-02 ENCOUNTER — Emergency Department (HOSPITAL_COMMUNITY): Payer: Medicare Other

## 2017-08-02 DIAGNOSIS — C787 Secondary malignant neoplasm of liver and intrahepatic bile duct: Secondary | ICD-10-CM | POA: Diagnosis present

## 2017-08-02 DIAGNOSIS — I10 Essential (primary) hypertension: Secondary | ICD-10-CM | POA: Diagnosis present

## 2017-08-02 DIAGNOSIS — Z66 Do not resuscitate: Secondary | ICD-10-CM | POA: Diagnosis present

## 2017-08-02 DIAGNOSIS — C771 Secondary and unspecified malignant neoplasm of intrathoracic lymph nodes: Secondary | ICD-10-CM | POA: Diagnosis present

## 2017-08-02 DIAGNOSIS — E785 Hyperlipidemia, unspecified: Secondary | ICD-10-CM | POA: Diagnosis present

## 2017-08-02 DIAGNOSIS — R509 Fever, unspecified: Secondary | ICD-10-CM

## 2017-08-02 DIAGNOSIS — B373 Candidiasis of vulva and vagina: Secondary | ICD-10-CM | POA: Diagnosis present

## 2017-08-02 DIAGNOSIS — Z808 Family history of malignant neoplasm of other organs or systems: Secondary | ICD-10-CM

## 2017-08-02 DIAGNOSIS — A4189 Other specified sepsis: Secondary | ICD-10-CM | POA: Diagnosis not present

## 2017-08-02 DIAGNOSIS — E162 Hypoglycemia, unspecified: Secondary | ICD-10-CM | POA: Diagnosis not present

## 2017-08-02 DIAGNOSIS — R0902 Hypoxemia: Secondary | ICD-10-CM | POA: Diagnosis present

## 2017-08-02 DIAGNOSIS — D61818 Other pancytopenia: Secondary | ICD-10-CM

## 2017-08-02 DIAGNOSIS — C3412 Malignant neoplasm of upper lobe, left bronchus or lung: Secondary | ICD-10-CM | POA: Diagnosis present

## 2017-08-02 DIAGNOSIS — E440 Moderate protein-calorie malnutrition: Secondary | ICD-10-CM | POA: Diagnosis present

## 2017-08-02 DIAGNOSIS — R918 Other nonspecific abnormal finding of lung field: Secondary | ICD-10-CM

## 2017-08-02 DIAGNOSIS — Z79899 Other long term (current) drug therapy: Secondary | ICD-10-CM

## 2017-08-02 DIAGNOSIS — Z833 Family history of diabetes mellitus: Secondary | ICD-10-CM

## 2017-08-02 DIAGNOSIS — Z9049 Acquired absence of other specified parts of digestive tract: Secondary | ICD-10-CM

## 2017-08-02 DIAGNOSIS — D61810 Antineoplastic chemotherapy induced pancytopenia: Secondary | ICD-10-CM | POA: Diagnosis present

## 2017-08-02 DIAGNOSIS — C7931 Secondary malignant neoplasm of brain: Secondary | ICD-10-CM

## 2017-08-02 DIAGNOSIS — T451X5A Adverse effect of antineoplastic and immunosuppressive drugs, initial encounter: Secondary | ICD-10-CM | POA: Diagnosis present

## 2017-08-02 DIAGNOSIS — Z7984 Long term (current) use of oral hypoglycemic drugs: Secondary | ICD-10-CM

## 2017-08-02 DIAGNOSIS — Z86010 Personal history of colonic polyps: Secondary | ICD-10-CM

## 2017-08-02 DIAGNOSIS — C3490 Malignant neoplasm of unspecified part of unspecified bronchus or lung: Secondary | ICD-10-CM | POA: Diagnosis present

## 2017-08-02 DIAGNOSIS — Z801 Family history of malignant neoplasm of trachea, bronchus and lung: Secondary | ICD-10-CM

## 2017-08-02 DIAGNOSIS — C7951 Secondary malignant neoplasm of bone: Secondary | ICD-10-CM | POA: Diagnosis present

## 2017-08-02 DIAGNOSIS — Z7982 Long term (current) use of aspirin: Secondary | ICD-10-CM

## 2017-08-02 DIAGNOSIS — Y95 Nosocomial condition: Secondary | ICD-10-CM | POA: Diagnosis present

## 2017-08-02 DIAGNOSIS — B9789 Other viral agents as the cause of diseases classified elsewhere: Secondary | ICD-10-CM | POA: Diagnosis present

## 2017-08-02 DIAGNOSIS — F17210 Nicotine dependence, cigarettes, uncomplicated: Secondary | ICD-10-CM | POA: Diagnosis present

## 2017-08-02 DIAGNOSIS — Z800 Family history of malignant neoplasm of digestive organs: Secondary | ICD-10-CM

## 2017-08-02 DIAGNOSIS — J129 Viral pneumonia, unspecified: Secondary | ICD-10-CM | POA: Diagnosis present

## 2017-08-02 DIAGNOSIS — J432 Centrilobular emphysema: Secondary | ICD-10-CM | POA: Diagnosis present

## 2017-08-02 DIAGNOSIS — Z8673 Personal history of transient ischemic attack (TIA), and cerebral infarction without residual deficits: Secondary | ICD-10-CM

## 2017-08-02 DIAGNOSIS — Z7902 Long term (current) use of antithrombotics/antiplatelets: Secondary | ICD-10-CM

## 2017-08-02 DIAGNOSIS — Z9849 Cataract extraction status, unspecified eye: Secondary | ICD-10-CM

## 2017-08-02 DIAGNOSIS — Z8781 Personal history of (healed) traumatic fracture: Secondary | ICD-10-CM

## 2017-08-02 DIAGNOSIS — I252 Old myocardial infarction: Secondary | ICD-10-CM

## 2017-08-02 DIAGNOSIS — Z85828 Personal history of other malignant neoplasm of skin: Secondary | ICD-10-CM

## 2017-08-02 DIAGNOSIS — I2510 Atherosclerotic heart disease of native coronary artery without angina pectoris: Secondary | ICD-10-CM | POA: Diagnosis present

## 2017-08-02 DIAGNOSIS — A419 Sepsis, unspecified organism: Secondary | ICD-10-CM | POA: Diagnosis present

## 2017-08-02 DIAGNOSIS — F329 Major depressive disorder, single episode, unspecified: Secondary | ICD-10-CM | POA: Diagnosis present

## 2017-08-02 DIAGNOSIS — M19049 Primary osteoarthritis, unspecified hand: Secondary | ICD-10-CM | POA: Diagnosis present

## 2017-08-02 DIAGNOSIS — Z885 Allergy status to narcotic agent status: Secondary | ICD-10-CM

## 2017-08-02 DIAGNOSIS — E11649 Type 2 diabetes mellitus with hypoglycemia without coma: Secondary | ICD-10-CM | POA: Diagnosis present

## 2017-08-02 DIAGNOSIS — M549 Dorsalgia, unspecified: Secondary | ICD-10-CM

## 2017-08-02 DIAGNOSIS — D638 Anemia in other chronic diseases classified elsewhere: Secondary | ICD-10-CM | POA: Diagnosis present

## 2017-08-02 DIAGNOSIS — R062 Wheezing: Secondary | ICD-10-CM

## 2017-08-02 DIAGNOSIS — Z955 Presence of coronary angioplasty implant and graft: Secondary | ICD-10-CM

## 2017-08-02 DIAGNOSIS — Z794 Long term (current) use of insulin: Secondary | ICD-10-CM

## 2017-08-02 DIAGNOSIS — X58XXXA Exposure to other specified factors, initial encounter: Secondary | ICD-10-CM | POA: Diagnosis present

## 2017-08-02 LAB — BASIC METABOLIC PANEL
Anion gap: 8 (ref 5–15)
BUN: 19 mg/dL (ref 6–20)
CALCIUM: 8.7 mg/dL — AB (ref 8.9–10.3)
CO2: 27 mmol/L (ref 22–32)
CREATININE: 0.72 mg/dL (ref 0.44–1.00)
Chloride: 100 mmol/L — ABNORMAL LOW (ref 101–111)
GFR calc Af Amer: >60 mL/min (ref >60)
GLUCOSE: 109 mg/dL — AB (ref 65–99)
Potassium: 4.1 mmol/L (ref 3.5–5.1)
Sodium: 135 mmol/L (ref 135–145)

## 2017-08-02 LAB — URINALYSIS, ROUTINE W REFLEX MICROSCOPIC
BACTERIA UA: NONE SEEN
BILIRUBIN URINE: NEGATIVE
Glucose, UA: NEGATIVE mg/dL
Ketones, ur: NEGATIVE mg/dL
Leukocytes, UA: NEGATIVE
Nitrite: NEGATIVE
PH: 6 (ref 5.0–8.0)
Protein, ur: 100 mg/dL — AB
SPECIFIC GRAVITY, URINE: 1.014 (ref 1.005–1.030)

## 2017-08-02 LAB — CBC
HEMATOCRIT: 27.8 % — AB (ref 36.0–46.0)
Hemoglobin: 9.1 g/dL — ABNORMAL LOW (ref 12.0–15.0)
MCH: 28.6 pg (ref 26.0–34.0)
MCHC: 32.7 g/dL (ref 30.0–36.0)
MCV: 87.4 fL (ref 78.0–100.0)
PLATELETS: 114 10*3/uL — AB (ref 150–400)
RBC: 3.18 MIL/uL — ABNORMAL LOW (ref 3.87–5.11)
RDW: 14 % (ref 11.5–15.5)
WBC: 2.8 10*3/uL — AB (ref 4.0–10.5)

## 2017-08-02 LAB — CBG MONITORING, ED
GLUCOSE-CAPILLARY: 103 mg/dL — AB (ref 65–99)
GLUCOSE-CAPILLARY: 146 mg/dL — AB (ref 65–99)

## 2017-08-02 LAB — I-STAT CG4 LACTIC ACID, ED: Lactic Acid, Venous: 1.74 mmol/L (ref 0.5–1.9)

## 2017-08-02 MED ORDER — ACETAMINOPHEN 325 MG PO TABS
650.0000 mg | ORAL_TABLET | Freq: Once | ORAL | Status: AC | PRN
  Administered 2017-08-02: 650 mg via ORAL
  Filled 2017-08-02: qty 2

## 2017-08-02 NOTE — ED Triage Notes (Signed)
Pt was hypoglycemic per daughter at 23 and told by PCP to come here, has since had OJ and hunny bun. Pt AxO x 4. States that around 630 during the episode of hypoglycemia had a L arm numbness which resolved with her sugar. Neuro intact bilaterally.

## 2017-08-03 ENCOUNTER — Other Ambulatory Visit: Payer: Self-pay

## 2017-08-03 ENCOUNTER — Encounter (HOSPITAL_COMMUNITY): Payer: Self-pay | Admitting: Nurse Practitioner

## 2017-08-03 DIAGNOSIS — E162 Hypoglycemia, unspecified: Secondary | ICD-10-CM | POA: Diagnosis present

## 2017-08-03 DIAGNOSIS — I252 Old myocardial infarction: Secondary | ICD-10-CM | POA: Diagnosis not present

## 2017-08-03 DIAGNOSIS — Z8781 Personal history of (healed) traumatic fracture: Secondary | ICD-10-CM | POA: Diagnosis not present

## 2017-08-03 DIAGNOSIS — B9789 Other viral agents as the cause of diseases classified elsewhere: Secondary | ICD-10-CM | POA: Diagnosis present

## 2017-08-03 DIAGNOSIS — D61810 Antineoplastic chemotherapy induced pancytopenia: Secondary | ICD-10-CM | POA: Diagnosis not present

## 2017-08-03 DIAGNOSIS — D61818 Other pancytopenia: Secondary | ICD-10-CM | POA: Insufficient documentation

## 2017-08-03 DIAGNOSIS — T451X5A Adverse effect of antineoplastic and immunosuppressive drugs, initial encounter: Secondary | ICD-10-CM

## 2017-08-03 DIAGNOSIS — C787 Secondary malignant neoplasm of liver and intrahepatic bile duct: Secondary | ICD-10-CM | POA: Diagnosis present

## 2017-08-03 DIAGNOSIS — B373 Candidiasis of vulva and vagina: Secondary | ICD-10-CM | POA: Diagnosis present

## 2017-08-03 DIAGNOSIS — Z85828 Personal history of other malignant neoplasm of skin: Secondary | ICD-10-CM | POA: Diagnosis not present

## 2017-08-03 DIAGNOSIS — C771 Secondary and unspecified malignant neoplasm of intrathoracic lymph nodes: Secondary | ICD-10-CM | POA: Diagnosis present

## 2017-08-03 DIAGNOSIS — E11649 Type 2 diabetes mellitus with hypoglycemia without coma: Secondary | ICD-10-CM | POA: Diagnosis present

## 2017-08-03 DIAGNOSIS — J129 Viral pneumonia, unspecified: Secondary | ICD-10-CM | POA: Diagnosis present

## 2017-08-03 DIAGNOSIS — X58XXXA Exposure to other specified factors, initial encounter: Secondary | ICD-10-CM | POA: Diagnosis present

## 2017-08-03 DIAGNOSIS — C3412 Malignant neoplasm of upper lobe, left bronchus or lung: Secondary | ICD-10-CM | POA: Diagnosis present

## 2017-08-03 DIAGNOSIS — Y95 Nosocomial condition: Secondary | ICD-10-CM | POA: Diagnosis present

## 2017-08-03 DIAGNOSIS — Z86010 Personal history of colonic polyps: Secondary | ICD-10-CM | POA: Diagnosis not present

## 2017-08-03 DIAGNOSIS — I2510 Atherosclerotic heart disease of native coronary artery without angina pectoris: Secondary | ICD-10-CM | POA: Diagnosis present

## 2017-08-03 DIAGNOSIS — A419 Sepsis, unspecified organism: Secondary | ICD-10-CM | POA: Diagnosis present

## 2017-08-03 DIAGNOSIS — D638 Anemia in other chronic diseases classified elsewhere: Secondary | ICD-10-CM | POA: Diagnosis present

## 2017-08-03 DIAGNOSIS — J189 Pneumonia, unspecified organism: Secondary | ICD-10-CM | POA: Diagnosis not present

## 2017-08-03 DIAGNOSIS — E785 Hyperlipidemia, unspecified: Secondary | ICD-10-CM | POA: Diagnosis present

## 2017-08-03 DIAGNOSIS — C7951 Secondary malignant neoplasm of bone: Secondary | ICD-10-CM | POA: Diagnosis present

## 2017-08-03 DIAGNOSIS — R509 Fever, unspecified: Secondary | ICD-10-CM | POA: Diagnosis not present

## 2017-08-03 DIAGNOSIS — A4189 Other specified sepsis: Secondary | ICD-10-CM | POA: Diagnosis present

## 2017-08-03 DIAGNOSIS — I10 Essential (primary) hypertension: Secondary | ICD-10-CM | POA: Diagnosis present

## 2017-08-03 DIAGNOSIS — F329 Major depressive disorder, single episode, unspecified: Secondary | ICD-10-CM | POA: Diagnosis present

## 2017-08-03 DIAGNOSIS — C3492 Malignant neoplasm of unspecified part of left bronchus or lung: Secondary | ICD-10-CM | POA: Diagnosis not present

## 2017-08-03 DIAGNOSIS — E440 Moderate protein-calorie malnutrition: Secondary | ICD-10-CM | POA: Diagnosis present

## 2017-08-03 DIAGNOSIS — Z955 Presence of coronary angioplasty implant and graft: Secondary | ICD-10-CM | POA: Diagnosis not present

## 2017-08-03 DIAGNOSIS — Z794 Long term (current) use of insulin: Secondary | ICD-10-CM | POA: Diagnosis not present

## 2017-08-03 DIAGNOSIS — J432 Centrilobular emphysema: Secondary | ICD-10-CM | POA: Diagnosis present

## 2017-08-03 DIAGNOSIS — Z8673 Personal history of transient ischemic attack (TIA), and cerebral infarction without residual deficits: Secondary | ICD-10-CM | POA: Diagnosis not present

## 2017-08-03 LAB — DIFFERENTIAL
Basophils Absolute: 0 10*3/uL (ref 0.0–0.1)
Basophils Relative: 1 %
EOS ABS: 0 10*3/uL (ref 0.0–0.7)
Eosinophils Relative: 0 %
LYMPHS PCT: 46 %
Lymphs Abs: 2 10*3/uL (ref 0.7–4.0)
MONOS PCT: 12 %
Monocytes Absolute: 0.5 10*3/uL (ref 0.1–1.0)
NEUTROS PCT: 41 %
Neutro Abs: 1.8 10*3/uL (ref 1.7–7.7)
Smear Review: DECREASED

## 2017-08-03 LAB — CBC WITH DIFFERENTIAL/PLATELET
BASOS ABS: 0 10*3/uL (ref 0.0–0.1)
Basophils Relative: 0 %
EOS ABS: 0.1 10*3/uL (ref 0.0–0.7)
Eosinophils Relative: 1 %
HCT: 22.2 % — ABNORMAL LOW (ref 36.0–46.0)
Hemoglobin: 7.5 g/dL — ABNORMAL LOW (ref 12.0–15.0)
LYMPHS PCT: 38 %
Lymphs Abs: 2.2 10*3/uL (ref 0.7–4.0)
MCH: 29.4 pg (ref 26.0–34.0)
MCHC: 33.8 g/dL (ref 30.0–36.0)
MCV: 87.1 fL (ref 78.0–100.0)
MONOS PCT: 9 %
Monocytes Absolute: 0.5 10*3/uL (ref 0.1–1.0)
NEUTROS PCT: 52 %
Neutro Abs: 3.1 10*3/uL (ref 1.7–7.7)
Platelets: 62 10*3/uL — ABNORMAL LOW (ref 150–400)
RBC: 2.55 MIL/uL — ABNORMAL LOW (ref 3.87–5.11)
RDW: 14.3 % (ref 11.5–15.5)
WBC MORPHOLOGY: INCREASED
WBC: 5.9 10*3/uL (ref 4.0–10.5)

## 2017-08-03 LAB — RESPIRATORY PANEL BY PCR
Adenovirus: NOT DETECTED
Bordetella pertussis: NOT DETECTED
CORONAVIRUS 229E-RVPPCR: NOT DETECTED
CORONAVIRUS HKU1-RVPPCR: NOT DETECTED
CORONAVIRUS OC43-RVPPCR: NOT DETECTED
Chlamydophila pneumoniae: NOT DETECTED
Coronavirus NL63: NOT DETECTED
Influenza A: NOT DETECTED
Influenza B: NOT DETECTED
Metapneumovirus: NOT DETECTED
Mycoplasma pneumoniae: NOT DETECTED
PARAINFLUENZA VIRUS 1-RVPPCR: NOT DETECTED
PARAINFLUENZA VIRUS 2-RVPPCR: NOT DETECTED
PARAINFLUENZA VIRUS 3-RVPPCR: NOT DETECTED
Parainfluenza Virus 4: NOT DETECTED
RHINOVIRUS / ENTEROVIRUS - RVPPCR: DETECTED — AB
Respiratory Syncytial Virus: NOT DETECTED

## 2017-08-03 LAB — URINALYSIS, ROUTINE W REFLEX MICROSCOPIC
BILIRUBIN URINE: NEGATIVE
Bacteria, UA: NONE SEEN
Glucose, UA: NEGATIVE mg/dL
HGB URINE DIPSTICK: NEGATIVE
Ketones, ur: NEGATIVE mg/dL
LEUKOCYTES UA: NEGATIVE
NITRITE: NEGATIVE
PROTEIN: 100 mg/dL — AB
Specific Gravity, Urine: 1.018 (ref 1.005–1.030)
pH: 6 (ref 5.0–8.0)

## 2017-08-03 LAB — PROCALCITONIN: PROCALCITONIN: 0.54 ng/mL

## 2017-08-03 LAB — GLUCOSE, CAPILLARY
Glucose-Capillary: 123 mg/dL — ABNORMAL HIGH (ref 65–99)
Glucose-Capillary: 176 mg/dL — ABNORMAL HIGH (ref 65–99)
Glucose-Capillary: 230 mg/dL — ABNORMAL HIGH (ref 65–99)

## 2017-08-03 LAB — CBG MONITORING, ED
GLUCOSE-CAPILLARY: 104 mg/dL — AB (ref 65–99)
GLUCOSE-CAPILLARY: 118 mg/dL — AB (ref 65–99)
GLUCOSE-CAPILLARY: 152 mg/dL — AB (ref 65–99)
GLUCOSE-CAPILLARY: 94 mg/dL (ref 65–99)
Glucose-Capillary: 83 mg/dL (ref 65–99)
Glucose-Capillary: 156 mg/dL — ABNORMAL HIGH (ref 65–99)
Glucose-Capillary: 106 mg/dL — ABNORMAL HIGH (ref 65–99)

## 2017-08-03 LAB — INFLUENZA PANEL BY PCR (TYPE A & B)
INFLAPCR: NEGATIVE
INFLBPCR: NEGATIVE

## 2017-08-03 LAB — HEPATIC FUNCTION PANEL
ALT: 24 U/L (ref 14–54)
AST: 27 U/L (ref 15–41)
Albumin: 2.7 g/dL — ABNORMAL LOW (ref 3.5–5.0)
Alkaline Phosphatase: 86 U/L (ref 38–126)
BILIRUBIN TOTAL: 0.4 mg/dL (ref 0.3–1.2)
Total Protein: 6.2 g/dL — ABNORMAL LOW (ref 6.5–8.1)

## 2017-08-03 LAB — BASIC METABOLIC PANEL
ANION GAP: 10 (ref 5–15)
BUN: 18 mg/dL (ref 6–20)
CHLORIDE: 100 mmol/L — AB (ref 101–111)
CO2: 23 mmol/L (ref 22–32)
Calcium: 7.3 mg/dL — ABNORMAL LOW (ref 8.9–10.3)
Creatinine, Ser: 0.88 mg/dL (ref 0.44–1.00)
GFR calc non Af Amer: >60 mL/min (ref >60)
Glucose, Bld: 148 mg/dL — ABNORMAL HIGH (ref 65–99)
POTASSIUM: 3.6 mmol/L (ref 3.5–5.1)
SODIUM: 133 mmol/L — AB (ref 135–145)

## 2017-08-03 LAB — STREP PNEUMONIAE URINARY ANTIGEN: Strep Pneumo Urinary Antigen: NEGATIVE

## 2017-08-03 LAB — I-STAT CG4 LACTIC ACID, ED
LACTIC ACID, VENOUS: 0.98 mmol/L (ref 0.5–1.9)
LACTIC ACID, VENOUS: 1.17 mmol/L (ref 0.5–1.9)

## 2017-08-03 MED ORDER — VANCOMYCIN HCL IN DEXTROSE 750-5 MG/150ML-% IV SOLN
750.0000 mg | INTRAVENOUS | Status: DC
  Administered 2017-08-04 – 2017-08-07 (×4): 750 mg via INTRAVENOUS
  Filled 2017-08-03 (×4): qty 150

## 2017-08-03 MED ORDER — CLOPIDOGREL BISULFATE 75 MG PO TABS
75.0000 mg | ORAL_TABLET | Freq: Every day | ORAL | Status: DC
  Administered 2017-08-03 – 2017-08-10 (×8): 75 mg via ORAL
  Filled 2017-08-03 (×8): qty 1

## 2017-08-03 MED ORDER — INSULIN ASPART 100 UNIT/ML ~~LOC~~ SOLN: 0.0000 [IU] | Freq: Three times a day (TID) | SUBCUTANEOUS | Status: DC

## 2017-08-03 MED ORDER — LIDOCAINE-PRILOCAINE 2.5-2.5 % EX CREA: 1.0000 "application " | TOPICAL_CREAM | CUTANEOUS | Status: DC | PRN

## 2017-08-03 MED ORDER — ENSURE ENLIVE PO LIQD
237.0000 mL | Freq: Two times a day (BID) | ORAL | Status: DC
  Filled 2017-08-03 (×2): qty 237

## 2017-08-03 MED ORDER — GUAIFENESIN ER 600 MG PO TB12
600.0000 mg | ORAL_TABLET | Freq: Two times a day (BID) | ORAL | Status: DC
  Administered 2017-08-03 – 2017-08-09 (×12): 600 mg via ORAL
  Filled 2017-08-03 (×13): qty 1

## 2017-08-03 MED ORDER — ACETAMINOPHEN 325 MG PO TABS
650.0000 mg | ORAL_TABLET | Freq: Four times a day (QID) | ORAL | Status: DC | PRN
  Administered 2017-08-03 – 2017-08-06 (×4): 650 mg via ORAL
  Filled 2017-08-03 (×5): qty 2

## 2017-08-03 MED ORDER — ENOXAPARIN SODIUM 30 MG/0.3ML ~~LOC~~ SOLN
30.0000 mg | Freq: Every day | SUBCUTANEOUS | Status: DC
  Administered 2017-08-03 – 2017-08-04 (×2): 30 mg via SUBCUTANEOUS
  Filled 2017-08-03 (×3): qty 0.3

## 2017-08-03 MED ORDER — ONDANSETRON HCL 4 MG PO TABS: 4.0000 mg | ORAL_TABLET | Freq: Four times a day (QID) | ORAL | Status: DC | PRN

## 2017-08-03 MED ORDER — ALPRAZOLAM 0.5 MG PO TABS
0.5000 mg | ORAL_TABLET | Freq: Every evening | ORAL | Status: DC | PRN
  Administered 2017-08-03 – 2017-08-08 (×6): 0.5 mg via ORAL
  Filled 2017-08-03 (×6): qty 1

## 2017-08-03 MED ORDER — ONDANSETRON HCL 4 MG/2ML IJ SOLN: 4.0000 mg | Freq: Four times a day (QID) | INTRAMUSCULAR | Status: DC | PRN

## 2017-08-03 MED ORDER — SODIUM CHLORIDE 0.9 % IV SOLN
INTRAVENOUS | Status: DC
  Administered 2017-08-03: 14:00:00 via INTRAVENOUS

## 2017-08-03 MED ORDER — ASPIRIN EC 81 MG PO TBEC: 81.0000 mg | DELAYED_RELEASE_TABLET | Freq: Every day | ORAL | Status: DC

## 2017-08-03 MED ORDER — VANCOMYCIN HCL IN DEXTROSE 1-5 GM/200ML-% IV SOLN
1000.0000 mg | Freq: Once | INTRAVENOUS | Status: AC
  Administered 2017-08-03: 1000 mg via INTRAVENOUS
  Filled 2017-08-03: qty 200

## 2017-08-03 MED ORDER — NITROGLYCERIN 0.4 MG SL SUBL: 0.4000 mg | SUBLINGUAL_TABLET | SUBLINGUAL | Status: DC | PRN

## 2017-08-03 MED ORDER — ASPIRIN EC 81 MG PO TBEC
81.0000 mg | DELAYED_RELEASE_TABLET | Freq: Every day | ORAL | Status: DC
  Administered 2017-08-03 – 2017-08-10 (×8): 81 mg via ORAL
  Filled 2017-08-03 (×8): qty 1

## 2017-08-03 MED ORDER — ATORVASTATIN CALCIUM 80 MG PO TABS
80.0000 mg | ORAL_TABLET | Freq: Every day | ORAL | Status: DC
  Administered 2017-08-03 – 2017-08-10 (×8): 80 mg via ORAL
  Filled 2017-08-03 (×10): qty 1

## 2017-08-03 MED ORDER — ACETAMINOPHEN 650 MG RE SUPP: 650.0000 mg | Freq: Four times a day (QID) | RECTAL | Status: DC | PRN

## 2017-08-03 MED ORDER — PIPERACILLIN-TAZOBACTAM 3.375 G IVPB 30 MIN
3.3750 g | Freq: Once | INTRAVENOUS | Status: AC
  Administered 2017-08-03: 3.375 g via INTRAVENOUS
  Filled 2017-08-03: qty 50

## 2017-08-03 MED ORDER — IPRATROPIUM-ALBUTEROL 0.5-2.5 (3) MG/3ML IN SOLN: 3.0000 mL | Freq: Four times a day (QID) | RESPIRATORY_TRACT | Status: DC | PRN

## 2017-08-03 MED ORDER — SODIUM CHLORIDE 0.9 % IV BOLUS
1000.0000 mL | Freq: Once | INTRAVENOUS | Status: AC
  Administered 2017-08-03: 1000 mL via INTRAVENOUS

## 2017-08-03 MED ORDER — INSULIN ASPART 100 UNIT/ML ~~LOC~~ SOLN
0.0000 [IU] | SUBCUTANEOUS | Status: DC
  Administered 2017-08-03: 2 [IU] via SUBCUTANEOUS
  Administered 2017-08-03: 3 [IU] via SUBCUTANEOUS
  Administered 2017-08-04: 2 [IU] via SUBCUTANEOUS
  Administered 2017-08-04 (×2): 3 [IU] via SUBCUTANEOUS
  Administered 2017-08-04 – 2017-08-05 (×2): 2 [IU] via SUBCUTANEOUS
  Administered 2017-08-05: 5 [IU] via SUBCUTANEOUS
  Administered 2017-08-05 (×2): 2 [IU] via SUBCUTANEOUS
  Administered 2017-08-05: 1 [IU] via SUBCUTANEOUS
  Administered 2017-08-06: 7 [IU] via SUBCUTANEOUS
  Administered 2017-08-06: 5 [IU] via SUBCUTANEOUS
  Administered 2017-08-06: 2 [IU] via SUBCUTANEOUS
  Administered 2017-08-07: 3 [IU] via SUBCUTANEOUS
  Administered 2017-08-07 (×2): 7 [IU] via SUBCUTANEOUS
  Administered 2017-08-08: 9 [IU] via SUBCUTANEOUS
  Administered 2017-08-08 (×2): 2 [IU] via SUBCUTANEOUS
  Administered 2017-08-08: 7 [IU] via SUBCUTANEOUS
  Administered 2017-08-08: 1 [IU] via SUBCUTANEOUS
  Administered 2017-08-09: 3 [IU] via SUBCUTANEOUS
  Administered 2017-08-09: 5 [IU] via SUBCUTANEOUS
  Administered 2017-08-09 – 2017-08-10 (×4): 2 [IU] via SUBCUTANEOUS
  Administered 2017-08-10: 1 [IU] via SUBCUTANEOUS
  Administered 2017-08-10: 3 [IU] via SUBCUTANEOUS
  Filled 2017-08-03: qty 1

## 2017-08-03 MED ORDER — PIPERACILLIN-TAZOBACTAM 3.375 G IVPB
3.3750 g | Freq: Three times a day (TID) | INTRAVENOUS | Status: DC
  Administered 2017-08-03 – 2017-08-07 (×12): 3.375 g via INTRAVENOUS
  Filled 2017-08-03 (×13): qty 50

## 2017-08-03 MED ORDER — METOPROLOL SUCCINATE ER 50 MG PO TB24
50.0000 mg | ORAL_TABLET | Freq: Every day | ORAL | Status: DC
  Administered 2017-08-03 – 2017-08-10 (×8): 50 mg via ORAL
  Filled 2017-08-03 (×10): qty 1

## 2017-08-03 NOTE — Progress Notes (Signed)
Steffanie Dunn, RN notified my attending physician via text that patient is actually a DO NOT RESUSCITATE-status will be updated.  Erin Hearing, ANP

## 2017-08-03 NOTE — H&P (Addendum)
History and Physical    Katherine Cole DOB: 03-14-1941 DOA: 08/02/2017  Referring MD/NP/PA: Dr. Roxanne Mins PCP: Prince Solian, MD  Patient coming from: Home  Chief Complaint:  low blood sugar  I have personally briefly reviewed patient's old medical records in [place]   HPI: Katherine Cole is a 77 y.o. 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  Patient had her first chemotherapy session on 4/16.  Since that time patient had been noted to have fairly good appetite.  However yesterday afternoon patient's blood glucose suddenly dropped to 62.  .  Patient was given food to keep her blood sugar up, but it kept dropping as low as 56.  Patient had associated symptoms of chills and weakness stating that felt as though her legs were shaky.  At that time patient had not had any fever.  Family was advised to bring the patient to the emergency department for further evaluation.  While waiting patient reports developing a dry cough that she thinks that she caught from someone in the waiting room.  Patient had just been hospitalized last month on 3/25 diagnosed with atypical pneumonia found to have left-sided mass that was related to be diagnosed squamous cell.  Denies having any significant rash or swelling around her port site.  ED Course: On admission into the emergency department patient was seen to be febrile to 102.9 F, respirations 17-29, blood pressures 111/55-156/74, and O2 saturations maintained on room air.  Labs revealed WBC 2.8, hemoglobin 9.1, platelets 114, and lactic acid 1.17.  Chest x-ray showed continued left-sided lung mass.  Urinalysis was negative.  Patient was started on empiric antibiotics of Vanco and Zosyn.  TRH called to admit.  Review of Systems  Constitutional: Negative for chills and fever.  HENT: Negative for ear discharge  and nosebleeds.   Eyes: Negative for double vision and photophobia.  Respiratory: Positive for cough, shortness of breath and wheezing.   Cardiovascular: Negative for chest pain and claudication.  Gastrointestinal: Negative for nausea and vomiting.  Genitourinary: Negative for dysuria and hematuria.  Musculoskeletal: Negative for back pain and falls.  Skin: Negative for itching and rash.  Neurological: Positive for weakness. Negative for focal weakness.  Psychiatric/Behavioral: Negative for substance abuse and suicidal ideas.    Past Medical History:  Diagnosis Date  . Arthritis    "in my fingers"   . CAD (coronary artery disease)    a. MI 1999 with 2 stents at that time followed by stenting 3-4 yrs later. b. s/p DES to LAD guided by pressure wire 03/11/12.  . Colon polyps    adenomatous  . Depression   . Diabetes mellitus, type 2 ([place])   . Hyperlipemia   . Hypertension   . Osteoarthritis   . Pneumonia 06/2017  . Skin cancer 2012   "nose; right middle finger"  . Stroke [facility])     Past Surgical History:  Procedure Laterality Date  . APPENDECTOMY  ~ 1965  . CATARACT EXTRACTION    . CORONARY ANGIOPLASTY WITH STENT PLACEMENT  1999; 2004; 03/11/2012   "2 + 1 + 1; total of 4" (03/11/2012)  . FEMUR FRACTURE SURGERY  2011   RLE (03/11/2012)  . IR FLUORO GUIDE PORT INSERTION RIGHT  07/18/2017  . IR US GUIDE VASC ACCESS RIGHT  07/18/2017  . PERCUTANEOUS CORONARY STENT INTERVENTION (PCI-S) N/A 03/11/2012  Procedure: PERCUTANEOUS CORONARY STENT INTERVENTION (PCI-S);  Surgeon: Sherren Mocha, MD;  Location: [facility] CATH LAB;  Service: Cardiovascular;  Laterality: N/A;  . SKIN CANCER EXCISION  2013   "nose & right middle finger; actinic keratosis" (03/11/2012)  . TONSILLECTOMY AND ADENOIDECTOMY  ~ 1962  . TUBAL LIGATION  1977  . VIDEO BRONCHOSCOPY WITH ENDOBRONCHIAL ULTRASOUND N/A 07/04/2017   Procedure: VIDEO BRONCHOSCOPY WITH ENDOBRONCHIAL ULTRASOUND;  Surgeon: Collene Gobble, MD;  Location:  [place];  Service: Thoracic;  Laterality: N/A;     reports that she has been smoking cigarettes.  She has a 51.00 pack-year smoking history. She has never used smokeless tobacco. She reports that she does not drink alcohol or use drugs.  Allergies  Allergen Reactions  . Codeine Other (See Comments)    "gives me a migraine" (03/11/2012)  . Morphine And Related Other (See Comments)    "gives me a migraine" (03/11/2012)    Family History  Problem Relation Age of Onset  . Liver cancer Mother   . Brain cancer Father   . Stomach cancer Father   . Colon cancer Brother 8  . Lung cancer Sister   . Lung cancer Brother   . Stomach cancer Brother   . Diabetes Daughter     Prior to Admission medications   Medication Sig Start Date End Date Taking? Authorizing Provider  ALPRAZolam Duanne Moron) 0.5 MG tablet Take 1 tablet (0.5 mg total) by mouth at bedtime as needed for anxiety or sleep. 07/08/17 07/08/18  Roxan Hockey, MD  amLODipine (NORVASC) 5 MG tablet Take 1 tablet (5 mg total) by mouth daily. 07/09/17   Roxan Hockey, MD  aspirin 81 MG tablet Take 1 tablet (81 mg total) by mouth daily. 01/21/14   Annita Brod, MD  atorvastatin (LIPITOR) 80 MG tablet Take 80 mg by mouth daily.    [provider]  clopidogrel (PLAVIX) 75 MG tablet Take 75 mg by mouth daily.    [provider]  guaiFENesin (MUCINEX) 600 MG 12 hr tablet Take 1 tablet (600 mg total) by mouth 2 (two) times daily. Patient not taking: Reported on 07/29/2017 07/08/17   Roxan Hockey, MD  HUMALOG KWIKPEN 100 UNIT/ML KiwkPen PLEASE SEE ATTACHED FOR DETAILED DIRECTIONS 07/23/17   [provider]  insulin glargine (LANTUS) 100 UNIT/ML injection Inject 0.08 mLs (8 Units total) into the skin at bedtime. 07/08/17   Emokpae, Courage, MD  JANUVIA 100 MG tablet Take 100 mg by mouth daily. 11/03/13   [provider]  lidocaine-prilocaine (EMLA) cream Apply 1 application topically as needed. 07/11/17   Curt Bears, MD  metFORMIN (GLUCOPHAGE) 1000 MG tablet Take 1,000 mg by mouth 2 (two) times daily with a meal.    [provider]  metoprolol succinate (TOPROL-XL) 50 MG 24 hr tablet Take 50 mg by mouth daily. Take with or immediately following a meal.    [provider]  nitroGLYCERIN (NITROSTAT) 0.4 MG SL tablet Place 1 tablet (0.4 mg total) under the tongue every 5 (five) minutes as needed (up to 3 doses). For chest pain Patient not taking: Reported on 07/29/2017 03/12/12   Charlie Pitter, PA-C  ondansetron (ZOFRAN ODT) 4 MG disintegrating tablet Take 1 tablet (4 mg total) by mouth every 8 (eight) hours as needed for nausea or vomiting. Patient not taking: Reported on 07/29/2017 07/08/17   Roxan Hockey, MD  ondansetron (ZOFRAN) 4 MG tablet Take 1 tablet (4 mg total) by mouth every 8 (eight) hours as needed  for nausea or vomiting. Patient not taking: Reported on 07/29/2017 09/10/14   Melony Overly, MD  prochlorperazine (COMPAZINE) 10 MG tablet Take 1 tablet (10 mg total) by mouth every 6 (six) hours as needed for nausea or vomiting. Patient not taking: Reported on 07/29/2017 07/22/17   Curt Bears, MD    Physical Exam:  Constitutional: Cachectic elderly female who appears to be no acute distress at this time Vitals:   08/03/17 0100 08/03/17 0145 08/03/17 0215 08/03/17 0235  BP: (!) 111/55 (!) 116/54 (!) 119/54   Pulse: 76 79 76 80  Resp: 18  17 (!) 29  Temp: (!) 101 F (38.3 C)   98.9 F (37.2 C)  TempSrc: Oral   Oral  SpO2: 95% 97% 94% 96%   Eyes: PERRL, lids and conjunctivae normal ENMT: Mucous membranes are moist. Posterior pharynx clear of any exudate or lesions.  Neck: normal, supple, no masses, no thyromegaly Respiratory: Prolonged expiratory phase with mild wheeze and significant rhonchi noted on left lung field.  O2 saturations on room air. Cardiovascular: Regular rate and rhythm, no murmurs / rubs / gallops. No extremity edema. 2+ pedal pulses. No carotid bruits.   Port-A-Cath in the right upper chest wall without significant signs of erythema. Abdomen: no tenderness, no masses palpated. No hepatosplenomegaly. Bowel sounds positive.  Musculoskeletal: no clubbing / cyanosis. No joint deformity upper and lower extremities. Good ROM, no contractures. Normal muscle tone.  Skin: no rashes, lesions, ulcers. No induration Neurologic: CN 2-12 grossly intact. Sensation intact, DTR normal. Strength 5/5 in all 4.  Psychiatric: Normal judgment and insight. Alert and oriented x 3. Normal mood.     Labs on Admission: I have personally reviewed following labs and imaging studies  CBC: Recent Labs  Lab 07/29/17 0813 08/02/17 2022 08/03/17 0140  WBC 8.2 2.8*  --   NEUTROABS 5.8  --  1.8  HGB 10.4* 9.1*  --   HCT 31.1* 27.8*  --   MCV 88.4 87.4  --   PLT 211 114*  --    Basic Metabolic Panel: Recent Labs  Lab 07/29/17 0813 08/02/17 2022  NA 133* 135  K 4.6 4.1  CL 100 100*  CO2 24 27  GLUCOSE 163* 109*  BUN 30* 19  CREATININE 0.82 0.72  CALCIUM 9.2 8.7*   GFR: Estimated Creatinine Clearance: 35.7 mL/min (by C-G formula based on SCr of 0.72 mg/dL). Liver Function Tests: Recent Labs  Lab 07/29/17 0813 08/03/17 0140  AST 16 27  ALT 19 24  ALKPHOS 114 86  BILITOT 0.4 0.4  PROT 6.9 6.2*  ALBUMIN 3.2* 2.7*   No results for input(s): LIPASE, AMYLASE in the last 168 hours. No results for input(s): AMMONIA in the last 168 hours. Coagulation Profile: No results for input(s): INR, PROTIME in the last 168 hours. Cardiac Enzymes: No results for input(s): CKTOTAL, CKMB, CKMBINDEX, TROPONINI in the last 168 hours. BNP (last 3 results) No results for input(s): PROBNP in the last 8760 hours. HbA1C: No results for input(s): HGBA1C in the last 72 hours. CBG: Recent Labs  Lab 08/02/17 2004 08/02/17 2126 08/03/17 0011 08/03/17 0138  GLUCAP 103* 146* 104* 83   Lipid Profile: No results for input(s): CHOL, HDL, LDLCALC, TRIG, CHOLHDL, LDLDIRECT  in the last 72 hours. Thyroid Function Tests: No results for input(s): TSH, T4TOTAL, FREET4, T3FREE, THYROIDAB in the last 72 hours. Anemia Panel: No results for input(s): VITAMINB12, FOLATE, FERRITIN, TIBC, IRON, RETICCTPCT in the last 72 hours. Urine analysis:  Component Value Date/Time   COLORURINE YELLOW 08/02/2017 2007   APPEARANCEUR CLEAR 08/02/2017 2007   LABSPEC 1.014 08/02/2017 2007   PHURINE 6.0 08/02/2017 2007   GLUCOSEU NEGATIVE 08/02/2017 2007   HGBUR SMALL (A) 08/02/2017 2007   BILIRUBINUR NEGATIVE 08/02/2017 2007   KETONESUR NEGATIVE 08/02/2017 2007   PROTEINUR 100 (A) 08/02/2017 2007   UROBILINOGEN 0.2 01/20/2014 1322   NITRITE NEGATIVE 08/02/2017 2007   LEUKOCYTESUR NEGATIVE 08/02/2017 2007   Sepsis Labs: No results found for this or any previous visit (from the past 240 hour(s)).   Radiological Exams on Admission: Dg Chest 2 View  Result Date: 08/02/2017 CLINICAL DATA:  Cough.  Low blood sugar for 2 days. EXAM: CHEST - 2 VIEW COMPARISON:  06/30/2017.  PET-CT, 07/17/2017. FINDINGS: Cardiac silhouette is normal in size. No mediastinal masses. Prominent left hilum consistent with adenopathy as noted on the prior PET-CT. Peripheral left upper lobe pleural based opacity is less apparent on the prior chest radiograph. Lungs are hyperexpanded. There is no evidence of pneumonia or pulmonary edema. No pleural effusion or pneumothorax. Right anterior chest wall Port-A-Cath is new from the prior exam. Tip projects in the lower superior vena cava. Skeletal structures are demineralized but grossly intact. IMPRESSION: 1. No acute cardiopulmonary disease. 2. Left upper lobe pleural-based lung mass consistent with carcinoma is less apparent than on the prior study. Prominent left hilum is consistent with metastatic adenopathy. Electronically Signed   By: Lajean Manes M.D.   On: 08/02/2017 20:49    EKG: Independently reviewed.  Sinus rhythm 74 Bpm  with borderline right axis  deviation  Assessment/Plan Sepsis, of unknown origin: Acute.  Patient presents with complaints of chills and weakness.  On admission found to be febrile up to 102.9 F with WBC 2.8, lactic acid reassuring at 0.98.  She reports having a dry cough.  This x-ray only showing left side lung mass.  Question possible postobstructive pneumonia or viral cause. - Admit to telemetry - Follow-up blood and sputum cultures cultures - Add-on procalcitonin - Check respiratory virus panel  - Check sputum culture if able - Continue vancomycin and Zosyn - Mucinex -Tylenol as needed for fever  Hypoglycemia, in diabetes mellitus type 2: Now resolved.  Patient reportedly was taking 15 units of Lantus daily along witth.  - Hypoglycemic protocol - Held Januvia, metformin -Consider decreasing Lantus dose to reduce risks of hypoglycemic episode   - Continue sliding-scale insulin  Pancytopenia: Patient acutely noted to have drops in all cell lines.  Denies any reports of bleeding.  Suspect symptoms secondary to recent chemotherapy treatments. - Discussed with oncology in a.m. - Recheck CBC  Primary small cell carcinoma: Patient recently started treatment chemotherapy on 4/16. - Dr. Julien Nordmann added to treatment team  Essential hypertension - Continue amlodipine and metoprolol  CAD - Continue Plavix    DVT prophylaxis: Discontinue Lovenox platelets drop below 100 Code Status: Full  Family Communication: Discussed plan of care with the patient family present bedside Disposition Plan: Likely discharge home once medically stable Consults called: none Admission status: Inpatient   Norval Morton MD [facility] Pager [phone]   If 7PM-7AM, please contact night-coverage [email or web address] Password [facility]  08/03/2017, 3:33 AM

## 2017-08-03 NOTE — Progress Notes (Addendum)
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  Patient presented to the ER with 24 hours of generalized weakness chills but primarily presented because of hypoglycemia CBG 56.  After arrival patient was found to have a temperature of 102 F.  Chest x-ray unremarkable although patient reporting nonproductive wet sounding cough.  Patient had mild pancytopenia white count 2800 absolute neutrophils 1.8% with platelets 114,000 noting patient is on chemotherapy with first dose is received on 4/16.  Since arrival patient has remained stable on room air.  Respiratory viral panel has returned positive for rhinovirus.  Continues with wet sounding cough.   Assessment & Plan:   Principal Problem:   Sepsis due to viral pneumonia  -Sepsis physiology positive only in context of immunosuppression from recent chemotherapy (see below); patient is otherwise hemodynamically stable with normal serum lactate and only mildly elevated PCT of 0.54 which is likely inaccurate in context of underlying malignancy -Continue Zosyn/vancomycin IV -Continue supportive care -Hypoxemic at rest so obtain ambulatory oximetry -Obtain influenza PCR -Follow blood cultures -Urinary strep negative -Normal saline IV fluid at 75/hr -Obtain urinalysis and culture-patient asymptomatic  Active Problems:   Primary small cell carcinoma of left lung / Antineoplastic chemotherapy induced pancytopenia -Started on carboplatin and etoposide on 4/16; followed by 1 dose of Neulasta (with plans to give every 3 weeks) -WBC 8200 with absolute neutrophils 5.8% on 4/23 current WBC low at 2800 date of admission -Platelets 211,000 on 4/23 with admission platelets  114,000 -Oncology/Mohamed added as consultant -Repeat CBC-if continues to trend downward with WBCs consider formal oncology consultation    Type 2 diabetes mellitus with hypoglycemia  -Suspect acute hypoglycemia related to acute infectious process -CBGs have remained steady between 83 and 156 since arrival -Continue to hold Lantus, Januvia, and metformin -Continue to follow CBGs every 4 hours with SSI -Encourage oral intake    Protein calorie malnutrition -In the context of known lung cancer and ongoing chemotherapy -Weight loss has been variable-she has gained weight and lost weight in the past month -Nutrition consultation -Sacral from prophylactic dressing -Air mattress    Essential hypertension -Hold Norvasc context of suboptimal blood pressure readings -Continue beta-blocker for now    Hyperlipemia -Continue statin    History of CAD -Continue ASA,Plavix and statin -If platelets continue to decrease we will need to hold aspirin and consider holding Plavix    DVT prophylaxis: Lovenox Code Status: Full Family Communication: Daughter Disposition Plan: Home   Consultants:   Oncology/Dr. Julien Nordmann added as consultant  Procedures:   None  Antimicrobials:   IV Zosyn 4/27>>  IV vancomycin 4/27>>   Subjective: Patient reports improvement in breathing and overall sensation of chills and myalgias.  Reports nonproductive cough.  Denies nausea or abdominal pain.  Objective: Vitals:   08/03/17 0807 08/03/17 0809 08/03/17 0900 08/03/17 1000  BP: (!) 120/47  (!) 125/58 (!) 117/50  Pulse: 85  78 68  Resp: (!) 24  (!) 29 (!) 30  Temp:  99.9 F (37.7 C)    TempSrc:  Oral    SpO2: 96%  95% 98%    Intake/Output Summary (Last 24 hours) at 08/03/2017 1100 Last data filed at 08/03/2017 0429 Gross per 24 hour  Intake 1200 ml  Output -  Net 1200 ml   There were no vitals filed for this visit.  Examination:  General exam: Appears calm and comfortable, appears  cachectic and underweight Respiratory system: Coarse to auscultation. Respiratory effort normal.  Room air Cardiovascular system: S1 & S2 heard, RRR. No JVD, murmurs, rubs, gallops or clicks. No pedal edema. Gastrointestinal system: Abdomen is nondistended, soft and nontender. No organomegaly or masses felt. Normal bowel sounds heard. Central nervous system: Alert and oriented. No focal neurological deficits. Extremities: Symmetric 5 x 5 power. Skin: No rashes, lesions or ulcers Psychiatry: Judgement and insight appear normal. Mood & affect appropriate.  Other: Port-A-Cath in place anterior right chest    Data Reviewed: I have personally reviewed following labs and imaging studies  CBC: Recent Labs  Lab 07/29/17 0813 08/02/17 2022 08/03/17 0140  WBC 8.2 2.8*  --   NEUTROABS 5.8  --  1.8  HGB 10.4* 9.1*  --   HCT 31.1* 27.8*  --   MCV 88.4 87.4  --   PLT 211 114*  --    Basic Metabolic Panel: Recent Labs  Lab 07/29/17 0813 08/02/17 2022  NA 133* 135  K 4.6 4.1  CL 100 100*  CO2 24 27  GLUCOSE 163* 109*  BUN 30* 19  CREATININE 0.82 0.72  CALCIUM 9.2 8.7*   GFR: Estimated Creatinine Clearance: 35.7 mL/min (by C-G formula based on SCr of 0.72 mg/dL). Liver Function Tests: Recent Labs  Lab 07/29/17 0813 08/03/17 0140  AST 16 27  ALT 19 24  ALKPHOS 114 86  BILITOT 0.4 0.4  PROT 6.9 6.2*  ALBUMIN 3.2* 2.7*   No results for input(s): LIPASE, AMYLASE in the last 168 hours. No results for input(s): AMMONIA in the last 168 hours. Coagulation Profile: No results for input(s): INR, PROTIME in the last 168 hours. Cardiac Enzymes: No results for input(s): CKTOTAL, CKMB, CKMBINDEX, TROPONINI in the last 168 hours. BNP (last 3 results) No results for input(s): PROBNP in the last 8760 hours. HbA1C: No results for input(s): HGBA1C in the last 72 hours. CBG: Recent Labs  Lab 08/03/17 0138 08/03/17 0404 08/03/17 0555 08/03/17 0806 08/03/17 0916  GLUCAP 83 118* 156*  106* 94   Lipid Profile: No results for input(s): CHOL, HDL, LDLCALC, TRIG, CHOLHDL, LDLDIRECT in the last 72 hours. Thyroid Function Tests: No results for input(s): TSH, T4TOTAL, FREET4, T3FREE, THYROIDAB in the last 72 hours. Anemia Panel: No results for input(s): VITAMINB12, FOLATE, FERRITIN, TIBC, IRON, RETICCTPCT in the last 72 hours. Sepsis Labs: Recent Labs  Lab 08/02/17 2038 08/03/17 0149 08/03/17 0415 08/03/17 0518  PROCALCITON  --   --   --  0.54  LATICACIDVEN 1.74 0.98 1.17  --     Recent Results (from the past 240 hour(s))  Respiratory Panel by PCR     Status: Abnormal   Collection Time: 08/03/17  5:32 AM  Result Value Ref Range Status   Adenovirus NOT DETECTED NOT DETECTED Final   Coronavirus 229E NOT DETECTED NOT DETECTED Final   Coronavirus HKU1 NOT DETECTED NOT DETECTED Final   Coronavirus NL63 NOT DETECTED NOT DETECTED Final   Coronavirus OC43 NOT DETECTED NOT DETECTED Final   Metapneumovirus NOT DETECTED NOT DETECTED Final   Rhinovirus / Enterovirus DETECTED (A) NOT DETECTED Final   Influenza A NOT DETECTED NOT DETECTED Final   Influenza B NOT DETECTED NOT DETECTED Final   Parainfluenza Virus 1 NOT DETECTED NOT DETECTED Final   Parainfluenza Virus 2 NOT DETECTED NOT DETECTED Final  Parainfluenza Virus 3 NOT DETECTED NOT DETECTED Final   Parainfluenza Virus 4 NOT DETECTED NOT DETECTED Final   Respiratory Syncytial Virus NOT DETECTED NOT DETECTED Final   Bordetella pertussis NOT DETECTED NOT DETECTED Final   Chlamydophila pneumoniae NOT DETECTED NOT DETECTED Final   Mycoplasma pneumoniae NOT DETECTED NOT DETECTED Final    Comment: Performed at [facility], [place] [address], [place], [place] [address]         Radiology Studies: Dg Chest 2 View  Result Date: 08/02/2017 CLINICAL DATA:  Cough.  Low blood sugar for 2 days. EXAM: CHEST - 2 VIEW COMPARISON:  06/30/2017.  PET-CT, 07/17/2017. FINDINGS: Cardiac silhouette is normal in size. No mediastinal  masses. Prominent left hilum consistent with adenopathy as noted on the prior PET-CT. Peripheral left upper lobe pleural based opacity is less apparent on the prior chest radiograph. Lungs are hyperexpanded. There is no evidence of pneumonia or pulmonary edema. No pleural effusion or pneumothorax. Right anterior chest wall Port-A-Cath is new from the prior exam. Tip projects in the lower superior vena cava. Skeletal structures are demineralized but grossly intact. IMPRESSION: 1. No acute cardiopulmonary disease. 2. Left upper lobe pleural-based lung mass consistent with carcinoma is less apparent than on the prior study. Prominent left hilum is consistent with metastatic adenopathy. Electronically Signed   By: Lajean Manes M.D.   On: 08/02/2017 20:49        Scheduled Meds: . atorvastatin  80 mg Oral Daily  . clopidogrel  75 mg Oral Daily  . enoxaparin (LOVENOX) injection  30 mg Subcutaneous Daily  . feeding supplement (ENSURE ENLIVE)  237 mL Oral BID BM  . guaiFENesin  600 mg Oral BID  . insulin aspart  0-9 Units Subcutaneous Q4H  . metoprolol succinate  50 mg Oral Daily   Continuous Infusions: . sodium chloride    . piperacillin-tazobactam (ZOSYN)  IV 3.375 g (08/03/17 0951)  . [START ON 08/04/2017] vancomycin       LOS: 0 days    Time spent: 20 minutes    ELLIS,ALLISON L., ANP [facility] Pager [phone]  If 7PM-7AM, please contact night-coverage [email or web address] Password [email or web address] 08/03/2017, 11:00 AM

## 2017-08-03 NOTE — ED Notes (Signed)
Notified nurse first of continued fever and asked to give PO motrin.

## 2017-08-03 NOTE — Progress Notes (Signed)
Pharmacy Antibiotic Note  Katherine Cole is a 77 y.o. female admitted on 08/02/2017 with fevers/sepsis.  Pharmacy has been consulted for Vancomycin and Zosyn  Dosing.  Vancomycin 1 g IV given in ED at [facility]: Vancomycin 750 mg IV q24h Zosyn 3.375 g IV q8h      Temp (24hrs), Avg:100.9 F (38.3 C), Min:98.9 F (37.2 C), Max:102.9 F (39.4 C)  Recent Labs  Lab 07/29/17 0813 08/02/17 2022 08/02/17 2038 08/03/17 0149 08/03/17 0415  WBC 8.2 2.8*  --   --   --   CREATININE 0.82 0.72  --   --   --   LATICACIDVEN  --   --  1.74 0.98 1.17    Estimated Creatinine Clearance: 35.7 mL/min (by C-G formula based on SCr of 0.72 mg/dL).    Allergies  Allergen Reactions  . Codeine Other (See Comments)    "gives me a migraine" (03/11/2012)  . Morphine And Related Other (See Comments)    "gives me a migraine" (03/11/2012)    Virginia Francisco, [place] Curb 08/03/2017 6:51 AM

## 2017-08-03 NOTE — ED Notes (Signed)
Attempted report 

## 2017-08-03 NOTE — ED Notes (Signed)
Patient sleeping at this time and family member asks that she be allowed to sleep.

## 2017-08-03 NOTE — ED Provider Notes (Signed)
[facility] EMERGENCY DEPARTMENT Provider Note   CSN: [id number] Arrival date & time: 08/02/17  1931     History   Chief Complaint Chief Complaint  Patient presents with  . Hypoglycemia    HPI Katherine Cole is a 77 y.o. female.  The history is provided by the patient and a relative.  She has history of diabetes, hypertension, hyperlipidemia, stroke, lung cancer, coronary artery disease and is currently undergoing chemotherapy for her lung cancer.  At home today, she was noted to have low blood sugar.  Blood sugar got as low as 56 which was treated with having her eat things at home.  Blood sugar was not staying up.  She did have some chills but did not have any known fever or sweats.  She has a mild cough which is unchanged from her baseline.  She denies nausea or vomiting.  She denies sore throat.  She denies any urinary difficulty.  Of note, she is DNR.  Past Medical History:  Diagnosis Date  . Arthritis    "in my fingers"   . CAD (coronary artery disease)    a. MI 1999 with 2 stents at that time followed by stenting 3-4 yrs later. b. s/p DES to LAD guided by pressure wire 03/11/12.  . Colon polyps    adenomatous  . Depression   . Diabetes mellitus, type 2 ([place])   . Hyperlipemia   . Hypertension   . Osteoarthritis   . Pneumonia 06/2017  . Skin cancer 2012   "nose; right middle finger"  . Stroke [facility])     Patient Active Problem List   Diagnosis Date Noted  . DNR (do not resuscitate) 07/29/2017  . Port-A-Cath in place 07/22/2017  . Encounter for antineoplastic chemotherapy 07/12/2017  . Goals of care, counseling/discussion 07/12/2017  . Moderate protein-calorie malnutrition ([place]) 07/12/2017  . Encounter for smoking cessation counseling 07/12/2017  . 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 Primary malignant neoplasm of left upper lobe of lung ([place]) 07/08/2017  . Malnutrition of moderate degree 07/02/2017  . CAP (community  acquired pneumonia) 07/01/2017  . Lung mass 07/01/2017  . Atypical pneumonia   . Acute CVA (cerebrovascular accident) ([place]) 01/20/2014  . Exertional angina ([place]) 03/12/2012  . CAD (coronary artery disease) 02/10/2012  . Tobacco abuse 02/10/2012  . Hypertension 02/10/2012  . Benign neoplasm of colon 08/01/2007  . Uncontrolled type 2 DM with peripheral circulatory disorder ([place]) 08/01/2007  . Hyperlipidemia 08/01/2007  . GASTROESOPHAGEAL REFLUX DISEASE 08/01/2007  . OSTEOARTHRITIS 08/01/2007    Past Surgical History:  Procedure Laterality Date  . APPENDECTOMY  ~ 1965  . CATARACT EXTRACTION    . CORONARY ANGIOPLASTY WITH STENT PLACEMENT  1999; 2004; 03/11/2012   "2 + 1 + 1; total of 4" (03/11/2012)  . FEMUR FRACTURE SURGERY  2011   RLE (03/11/2012)  . IR FLUORO GUIDE PORT INSERTION RIGHT  07/18/2017  . IR US GUIDE VASC ACCESS RIGHT  07/18/2017  . PERCUTANEOUS CORONARY STENT INTERVENTION (PCI-S) N/A 03/11/2012   Procedure: PERCUTANEOUS CORONARY STENT INTERVENTION (PCI-S);  Surgeon: Sherren Mocha, MD;  Location: [facility] CATH LAB;  Service: Cardiovascular;  Laterality: N/A;  . SKIN CANCER EXCISION  2013   "nose & right middle finger; actinic keratosis" (03/11/2012)  . TONSILLECTOMY AND ADENOIDECTOMY  ~ 1962  . TUBAL LIGATION  1977  . VIDEO BRONCHOSCOPY WITH ENDOBRONCHIAL ULTRASOUND N/A 07/04/2017   Procedure: VIDEO BRONCHOSCOPY WITH ENDOBRONCHIAL ULTRASOUND;  Surgeon: Collene Gobble,  MD;  Location: [facility] OR;  Service: Thoracic;  Laterality: N/A;     OB History   None      Home Medications    Prior to Admission medications   Medication Sig Start Date End Date Taking? Authorizing Provider  ALPRAZolam Duanne Moron) 0.5 MG tablet Take 1 tablet (0.5 mg total) by mouth at bedtime as needed for anxiety or sleep. 07/08/17 07/08/18  Roxan Hockey, MD  amLODipine (NORVASC) 5 MG tablet Take 1 tablet (5 mg total) by mouth daily. 07/09/17   Roxan Hockey, MD  aspirin 81 MG tablet Take 1 tablet (81 mg total)  by mouth daily. 01/21/14   Annita Brod, MD  atorvastatin (LIPITOR) 80 MG tablet Take 80 mg by mouth daily.    [provider]  clopidogrel (PLAVIX) 75 MG tablet Take 75 mg by mouth daily.    [provider]  guaiFENesin (MUCINEX) 600 MG 12 hr tablet Take 1 tablet (600 mg total) by mouth 2 (two) times daily. Patient not taking: Reported on 07/29/2017 07/08/17   Roxan Hockey, MD  HUMALOG KWIKPEN 100 UNIT/ML KiwkPen PLEASE SEE ATTACHED FOR DETAILED DIRECTIONS 07/23/17   [provider]  insulin glargine (LANTUS) 100 UNIT/ML injection Inject 0.08 mLs (8 Units total) into the skin at bedtime. 07/08/17   Emokpae, Courage, MD  JANUVIA 100 MG tablet Take 100 mg by mouth daily. 11/03/13   [provider]  lidocaine-prilocaine (EMLA) cream Apply 1 application topically as needed. 07/11/17   Curt Bears, MD  metFORMIN (GLUCOPHAGE) 1000 MG tablet Take 1,000 mg by mouth 2 (two) times daily with a meal.    [provider]  metoprolol succinate (TOPROL-XL) 50 MG 24 hr tablet Take 50 mg by mouth daily. Take with or immediately following a meal.    [provider]  nitroGLYCERIN (NITROSTAT) 0.4 MG SL tablet Place 1 tablet (0.4 mg total) under the tongue every 5 (five) minutes as needed (up to 3 doses). For chest pain Patient not taking: Reported on 07/29/2017 03/12/12   Charlie Pitter, PA-C  ondansetron (ZOFRAN ODT) 4 MG disintegrating tablet Take 1 tablet (4 mg total) by mouth every 8 (eight) hours as needed for nausea or vomiting. Patient not taking: Reported on 07/29/2017 07/08/17   Roxan Hockey, MD  ondansetron (ZOFRAN) 4 MG tablet Take 1 tablet (4 mg total) by mouth every 8 (eight) hours as needed for nausea or vomiting. Patient not taking: Reported on 07/29/2017 09/10/14   Melony Overly, MD  prochlorperazine (COMPAZINE) 10 MG tablet Take 1 tablet (10 mg total) by mouth every 6 (six) hours as needed for nausea or vomiting. Patient not taking: Reported on  07/29/2017 07/22/17   Curt Bears, MD    Family History Family History  Problem Relation Age of Onset  . Liver cancer Mother   . Brain cancer Father   . Stomach cancer Father   . Colon cancer Brother 88  . Lung cancer Sister   . Lung cancer Brother   . Stomach cancer Brother   . Diabetes Daughter     Social History Social History   Tobacco Use  . Smoking status: Current Every Day Smoker    Packs/day: 1.00    Years: 51.00    Pack years: 51.00    Types: Cigarettes  . Smokeless tobacco: Never Used  Substance Use Topics  . Alcohol use: No  . Drug use: No     Allergies   Codeine and Morphine and related   Review  of Systems Review of Systems  All other systems reviewed and are negative.    Physical Exam Updated Vital Signs BP 111/74 (BP Location: Right Arm)   Pulse 94   Temp 99.1 F (37.3 C) (Oral)   Resp 20   SpO2 99%   Physical Exam  Neurological: Coordination (.dgp) normal.  Nursing note and vitals reviewed.  Somewhat cachectic appearing 77 year old female, resting comfortably and in no acute distress. Vital signs are normal. Oxygen saturation is 99%, which is normal. Head is normocephalic and atraumatic. PERRLA, EOMI. Oropharynx is clear. Neck is nontender and supple without adenopathy or JVD. Back is nontender and there is no CVA tenderness. Lungs are clear without rales, wheezes, or rhonchi. Chest is nontender.  Mediport present on the right. Heart has regular rate and rhythm without murmur. Abdomen is soft, flat, nontender without masses or hepatosplenomegaly and peristalsis is normoactive. Extremities have no cyanosis or edema, full range of motion is present. Skin is warm and dry without rash. Neurologic: Mental status is normal, cranial nerves are intact, there are no motor or sensory deficits.  ED Treatments / Results  Labs (all labs ordered are listed, but only abnormal results are displayed) Labs Reviewed  CBC - Abnormal; Notable for  the following components:      Result Value   WBC 2.8 (*)    RBC 3.18 (*)    Hemoglobin 9.1 (*)    HCT 27.8 (*)    Platelets 114 (*)    All other components within normal limits  BASIC METABOLIC PANEL - Abnormal; Notable for the following components:   Chloride 100 (*)    Glucose, Bld 109 (*)    Calcium 8.7 (*)    All other components within normal limits  URINALYSIS, ROUTINE W REFLEX MICROSCOPIC - Abnormal; Notable for the following components:   Hgb urine dipstick SMALL (*)    Protein, ur 100 (*)    All other components within normal limits  HEPATIC FUNCTION PANEL - Abnormal; Notable for the following components:   Total Protein 6.2 (*)    Albumin 2.7 (*)    Bilirubin, Direct <0.1 (*)    All other components within normal limits  CBG MONITORING, ED - Abnormal; Notable for the following components:   Glucose-Capillary 103 (*)    All other components within normal limits  CBG MONITORING, ED - Abnormal; Notable for the following components:   Glucose-Capillary 146 (*)    All other components within normal limits  CBG MONITORING, ED - Abnormal; Notable for the following components:   Glucose-Capillary 104 (*)    All other components within normal limits  CULTURE, BLOOD (ROUTINE X 2)  CULTURE, BLOOD (ROUTINE X 2)  DIFFERENTIAL  I-STAT CG4 LACTIC ACID, ED  I-STAT CG4 LACTIC ACID, ED  I-STAT CG4 LACTIC ACID, ED  CBG MONITORING, ED  I-STAT CG4 LACTIC ACID, ED    EKG EKG Interpretation  Date/Time:  Sunday August 03 2017 01:55:15 EDT Ventricular Rate:  74 PR Interval:    QRS Duration: 90 QT Interval:  407 QTC Calculation: 452 R Axis:   86 Text Interpretation:  Sinus rhythm Borderline right axis deviation When compared with ECG of 05/29/2015, No significant change was found Confirmed by Delora Fuel ([id number]) on 08/03/2017 2:16:56 AM   Radiology Dg Chest 2 View  Result Date: 08/02/2017 CLINICAL DATA:  Cough.  Low blood sugar for 2 days. EXAM: CHEST - 2 VIEW COMPARISON:   06/30/2017.  PET-CT, 07/17/2017. FINDINGS: Cardiac silhouette is  normal in size. No mediastinal masses. Prominent left hilum consistent with adenopathy as noted on the prior PET-CT. Peripheral left upper lobe pleural based opacity is less apparent on the prior chest radiograph. Lungs are hyperexpanded. There is no evidence of pneumonia or pulmonary edema. No pleural effusion or pneumothorax. Right anterior chest wall Port-A-Cath is new from the prior exam. Tip projects in the lower superior vena cava. Skeletal structures are demineralized but grossly intact. IMPRESSION: 1. No acute cardiopulmonary disease. 2. Left upper lobe pleural-based lung mass consistent with carcinoma is less apparent than on the prior study. Prominent left hilum is consistent with metastatic adenopathy. Electronically Signed   By: Lajean Manes M.D.   On: 08/02/2017 20:49    Procedures Procedures  Medications Ordered in ED Medications  acetaminophen (TYLENOL) tablet 650 mg (650 mg Oral Given 08/02/17 2009)  piperacillin-tazobactam (ZOSYN) IVPB 3.375 g (0 g Intravenous Stopped 08/03/17 0303)  vancomycin (VANCOCIN) IVPB 1000 mg/200 mL premix (1,000 mg Intravenous New Bag/Given 08/03/17 0233)  sodium chloride 0.9 % bolus 1,000 mL (1,000 mLs Intravenous New Bag/Given 08/03/17 0233)     Initial Impression / Assessment and Plan / ED Course  I have reviewed the triage vital signs and the nursing notes.  Pertinent labs & imaging results that were available during my care of the patient were reviewed by me and considered in my medical decision making (see chart for details).  Hypoglycemia and patient was known diabetic.  Glucose has been adequate in the ED, plus she has been given additional food to eat.  She states that she has been eating reasonably well at home.  Laboratory evaluation shows leukopenia and thrombocytopenia which are presumably related to chemotherapy.  Hemoglobin has also dropped over the last 4 days from 10.4 to  9.1.  Last chemotherapy was on April 19, at which time she received pegfilgrastim, dexamethasone, etoposide.  She was febrile at triage, temperature came down and then went back up.  Labs are sent for sepsis evaluation.  Differential as needed to see if she is dangerously neutropenic.  Chest x-ray showed no infiltrate and urinalysis shows no evidence of urinary tract infection.  Source of fever is unclear, so she is started on antibiotics for sepsis of undetermined origin.  Lactic acid level has come back normal.  Absolute neutrophil count is over 1000, so she does not need to be on a neutropenic fever protocol.  However, it is not clear whether her WBC will continue to drop with recent chemotherapy.  She will be admitted to monitor WBC and continue antibiotics pending culture results.  Case is discussed with Dr. Tamala Julian of [facility] who agrees to admit the patient.  Final Clinical Impressions(s) / ED Diagnoses   Final diagnoses:  Fever, unspecified fever cause  Pancytopenia ([place])  Hypoglycemia  Small cell lung cancer, left upper lobe [facility])    ED Discharge Orders    None       Delora Fuel, MD 70/35/00 [phone]

## 2017-08-04 ENCOUNTER — Telehealth: Payer: Self-pay | Admitting: *Deleted

## 2017-08-04 ENCOUNTER — Encounter (HOSPITAL_COMMUNITY): Payer: Self-pay | Admitting: General Practice

## 2017-08-04 ENCOUNTER — Other Ambulatory Visit: Payer: Self-pay

## 2017-08-04 DIAGNOSIS — I10 Essential (primary) hypertension: Secondary | ICD-10-CM

## 2017-08-04 DIAGNOSIS — R509 Fever, unspecified: Secondary | ICD-10-CM

## 2017-08-04 DIAGNOSIS — M549 Dorsalgia, unspecified: Secondary | ICD-10-CM

## 2017-08-04 LAB — GLUCOSE, CAPILLARY
GLUCOSE-CAPILLARY: 178 mg/dL — AB (ref 65–99)
GLUCOSE-CAPILLARY: 225 mg/dL — AB (ref 65–99)
GLUCOSE-CAPILLARY: 219 mg/dL — AB (ref 65–99)
Glucose-Capillary: 102 mg/dL — ABNORMAL HIGH (ref 65–99)
Glucose-Capillary: 116 mg/dL — ABNORMAL HIGH (ref 65–99)

## 2017-08-04 LAB — CBC
HCT: 20.4 % — ABNORMAL LOW (ref 36.0–46.0)
Hemoglobin: 6.8 g/dL — CL (ref 12.0–15.0)
MCH: 29.1 pg (ref 26.0–34.0)
MCHC: 33.3 g/dL (ref 30.0–36.0)
MCV: 87.2 fL (ref 78.0–100.0)
PLATELETS: 52 10*3/uL — AB (ref 150–400)
RBC: 2.34 MIL/uL — ABNORMAL LOW (ref 3.87–5.11)
RDW: 14.5 % (ref 11.5–15.5)
WBC: 7 10*3/uL (ref 4.0–10.5)

## 2017-08-04 LAB — URINE CULTURE: Culture: NO GROWTH

## 2017-08-04 LAB — BASIC METABOLIC PANEL
ANION GAP: 10 (ref 5–15)
BUN: 11 mg/dL (ref 6–20)
CALCIUM: 7.1 mg/dL — AB (ref 8.9–10.3)
CO2: 23 mmol/L (ref 22–32)
CREATININE: 0.86 mg/dL (ref 0.44–1.00)
Chloride: 104 mmol/L (ref 101–111)
GLUCOSE: 119 mg/dL — AB (ref 65–99)
Potassium: 3.6 mmol/L (ref 3.5–5.1)
Sodium: 137 mmol/L (ref 135–145)

## 2017-08-04 LAB — RETICULOCYTES
RBC.: 3.13 MIL/uL — ABNORMAL LOW (ref 3.87–5.11)
Retic Count, Absolute: 37.6 10*3/uL (ref 19.0–186.0)
Retic Ct Pct: 1.2 % (ref 0.4–3.1)

## 2017-08-04 LAB — FERRITIN: Ferritin: 389 ng/mL — ABNORMAL HIGH (ref 11–307)

## 2017-08-04 LAB — LEGIONELLA PNEUMOPHILA SEROGP 1 UR AG: L. PNEUMOPHILA SEROGP 1 UR AG: NEGATIVE

## 2017-08-04 LAB — IRON AND TIBC
IRON: 156 ug/dL (ref 28–170)
SATURATION RATIOS: 94 % — AB (ref 10.4–31.8)
TIBC: 165 ug/dL — ABNORMAL LOW (ref 250–450)
UIBC: 9 ug/dL

## 2017-08-04 LAB — EXPECTORATED SPUTUM ASSESSMENT W REFEX TO RESP CULTURE

## 2017-08-04 LAB — HEMOGLOBIN AND HEMATOCRIT, BLOOD
HCT: 26.9 % — ABNORMAL LOW (ref 36.0–46.0)
HEMOGLOBIN: 9.3 g/dL — AB (ref 12.0–15.0)

## 2017-08-04 LAB — EXPECTORATED SPUTUM ASSESSMENT W GRAM STAIN, RFLX TO RESP C

## 2017-08-04 LAB — VITAMIN B12: VITAMIN B 12: 1277 pg/mL — AB (ref 180–914)

## 2017-08-04 LAB — PREPARE RBC (CROSSMATCH)

## 2017-08-04 MED ORDER — AMLODIPINE BESYLATE 5 MG PO TABS
5.0000 mg | ORAL_TABLET | Freq: Every day | ORAL | Status: DC
  Administered 2017-08-04 – 2017-08-05 (×2): 5 mg via ORAL
  Filled 2017-08-04 (×2): qty 1

## 2017-08-04 MED ORDER — IPRATROPIUM-ALBUTEROL 0.5-2.5 (3) MG/3ML IN SOLN
3.0000 mL | Freq: Four times a day (QID) | RESPIRATORY_TRACT | Status: DC
  Filled 2017-08-04: qty 3

## 2017-08-04 MED ORDER — POLYETHYLENE GLYCOL 3350 17 G PO PACK
17.0000 g | PACK | Freq: Every day | ORAL | Status: DC
  Filled 2017-08-04 (×6): qty 1

## 2017-08-04 MED ORDER — ENOXAPARIN SODIUM 300 MG/3ML IJ SOLN
20.0000 mg | Freq: Every day | INTRAMUSCULAR | Status: DC
  Administered 2017-08-05 – 2017-08-10 (×6): 20 mg via SUBCUTANEOUS
  Filled 2017-08-04 (×7): qty 0.2

## 2017-08-04 MED ORDER — SODIUM CHLORIDE 0.9 % IV SOLN
Freq: Once | INTRAVENOUS | Status: AC
  Administered 2017-08-04: 12:00:00 via INTRAVENOUS

## 2017-08-04 MED ORDER — SODIUM CHLORIDE 0.9% FLUSH: 10.0000 mL | INTRAVENOUS | Status: DC | PRN

## 2017-08-04 MED ORDER — IPRATROPIUM-ALBUTEROL 0.5-2.5 (3) MG/3ML IN SOLN
3.0000 mL | Freq: Four times a day (QID) | RESPIRATORY_TRACT | Status: DC | PRN
  Administered 2017-08-04: 3 mL via RESPIRATORY_TRACT

## 2017-08-04 MED ORDER — BOOST PLUS PO LIQD
237.0000 mL | Freq: Three times a day (TID) | ORAL | Status: DC
  Administered 2017-08-04 – 2017-08-10 (×15): 237 mL via ORAL
  Filled 2017-08-04 (×24): qty 237

## 2017-08-04 NOTE — Telephone Encounter (Signed)
Oncology Nurse Navigator Documentation  Oncology Nurse Navigator Flowsheets 08/04/2017  Navigator Location [facility]  Navigator Encounter Type Telephone/I received a call from patient's daughter.  She stated that her mom is in the hospital and wanted to update [place].  She also had questions about her appts.  I explained that I will cancel appt for tomorrow and will keep the appt on 08/11/17 and I will update Dr. Julien Nordmann that she is in the hospital.    Telephone Outgoing Call  Treatment Phase Treatment  Barriers/Navigation Needs Education;Coordination of Care  Education Other  Interventions Coordination of Care;Education  Coordination of Care Other  Education Method Verbal  Acuity Level 2  Time Spent with Patient 30

## 2017-08-04 NOTE — Progress Notes (Signed)
CRITICAL VALUE ALERT  Critical Value:  HGB 6.8  Date & Time Notied:  08/04/17, 4239  Provider Notified: Dr. Irene Pap  Orders Received/Actions taken: Waiting return call

## 2017-08-04 NOTE — Progress Notes (Signed)
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  GOAL:   Patient will meet greater than or equal to 90% of their needs  MONITOR:   PO intake, Supplement acceptance, Labs, Weight trends, Skin, I & O's  REASON FOR ASSESSMENT:   Consult, Malnutrition Screening Tool Assessment of nutrition requirement/status  ASSESSMENT:   Katherine Cole is a 77 y.o. 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    Pt admitted with hypoglycemia.   Pt recently started chemotherapy on 07/22/17.   Case discussed with RN prior to visit, who reports pt eats small amounts of food and has been consuming Boost supplements. Diet was just liberalized to regular.   Spoke with pt and daughter at bedside. They report that have been working very hard on pt's intake. Per pt daughter, pt eats several small meals per day and daughter provides pt with whatever she will eat (ex [place] food, japanese food, as well as snacks such as nuts and ice cream). Pt also consume 3 Boost High Protein supplements daily. Pt is continually offered food throughout the day to help maximize intake (per daughter, eating can be a very laborious task for pt). They both recalled RD visit at cancer center earlier this month, which they were able verbalize what was taught and had implemented many changes.   Per pt, pt has gained 4#, but lost over the  past few weeks. Per pt daughter, UBW is around 105# and pt has progressively lost wt over the past 1.5-2 years.   Pt reports she checks her blood sugar, which is typically well controlled (less than 200). She is unsure of what her last Hgb A1c was. She is compliant with taking DM medications.   Discussed with pt daughter importance of good meal and supplement intake to promote healing. Pt daughter reports that she has brought snacks and home supplements for pt to consume. They deny any further nutritional needs at this time, but expressed appreciation for visit.   Last Hgb A1c: 10.6 (01/21/14). No recent A1c found. PTA DM medications 8 units insulin glargine q HS, 100 mg januvia daily, and 1000 mg metformin BID.   Labs reviewed: CBGS: 102-176 (inpatient orders for glycemic contorl are 0-9 units insulin aspart every 4 hours).   NUTRITION - FOCUSED PHYSICAL EXAM:    Most Recent Value  Orbital Region  No depletion  Upper Arm Region  Mild depletion  Thoracic and Lumbar Region  Mild depletion  Buccal Region  No depletion  Temple Region  No depletion  Clavicle Bone Region  Mild depletion  Clavicle and Acromion Bone Region  Mild depletion  Scapular Bone Region  Mild depletion  Dorsal Hand  Mild depletion  Patellar Region  Mild depletion  Anterior Thigh Region  Mild depletion  Posterior Calf Region  Mild depletion  Edema (RD Assessment)  Mild  Hair  Reviewed  Eyes  Reviewed  Mouth  Reviewed  Skin  Reviewed  Nails  Reviewed  Diet Order:  Diet regular Room service appropriate? Yes; Fluid consistency: Thin  EDUCATION NEEDS:   Education needs have been addressed  Skin:  Skin Assessment: Reviewed RN Assessment  Last BM:  08/02/17  Height:   Ht Readings from Last 1 Encounters:  08/04/17 5\' 1"  (1.549 m)    Weight:   Wt Readings from Last 1 Encounters:  08/04/17 80 lb (36.3 kg)    Ideal Body Weight:  47.7 kg  BMI:  Body mass index is 15.12 kg/m.  Estimated  Nutritional Needs:   Kcal:  1100-1300  Protein:  55-70 grams  Fluid:  > 1.1 L    Tregan Read A. Jimmye Norman, RD, LDN, CDE Pager: [phone] After hours Pager: [phone]

## 2017-08-04 NOTE — Progress Notes (Addendum)
PROGRESS NOTE  [name]:[id number] DOB: 02-21-1941 DOA: 08/02/2017 PCP: Prince Solian, MD  HPI/Recap of past 24 hours: Katherine Cole is a 77 y.o. 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  Patient had her first chemotherapy session on 4/16.  Since that time patient had been noted to have fairly good appetite.  However yesterday afternoon patient's blood glucose suddenly dropped to 62.  .  Patient was given food to keep her blood sugar up, but it kept dropping as low as 56.  Patient had associated symptoms of chills and weakness stating that felt as though her legs were shaky.  At that time patient had not had any fever.  Family was advised to bring the patient to the emergency department for further evaluation.  While waiting patient reports developing a dry cough that she thinks that she caught from someone in the waiting room.  Patient had just been hospitalized last month on 3/25 diagnosed with atypical pneumonia found to have left-sided mass that was related to be diagnosed squamous cell.  Denies having any significant rash or swelling around her port site.  08/04/2017: Patient seen and examined with her daughter at bedside.  She has no new complaints.  Hemoglobin noted to be 6.8.  Ordered 2 units of PRBC to be transfused.  After 1 unit of PRBC patient became febrile, hypertensive, and tachypneic.  Will obtain an H&H after 1 unit PRBC transfused.  If needed will give the other unit in the morning.  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)   Antineoplastic chemotherapy induced pancytopenia (HCC)   Hyperlipemia   Viral pneumonia  Sepsis secondary to HCAP,  POA Continue IV vancomycin and IV Zosyn Continue duo nebs treatments Continue O2 supplementation to maintain saturation 92% or greater Monitor fever curve Repeat CBC in the morning  Anemia of chronic disease Hemoglobin dropped to 6.8 from 7.5 Obtain FOBT Obtain iron studies  Hypertension Continue home antihypertensive medication metoprolol XL 50 mg daily Restart amlodipine 5 mg daily  Type 2 diabetes Continue insulin sliding scale  Hyperlipidemia Continue atorvastatin  Coronary artery disease Continue statin and Plavix  History of left upper lobe lung small cell carcinoma  Follow-up with oncology outpatient  Moderate protein calorie malnutrition Albumin 2.7 from 08/03/17 Continue oral supplement    Code Status: Full code  Family Communication: Daughter at bedside  Disposition Plan: Home when clinically stable   Consultants:  None  Procedures:  None  Antimicrobials:  IV vancomycin   IV Zosyn  DVT prophylaxis: Subcu Lovenox   Objective: Vitals:   08/04/17 0348 08/04/17 1023 08/04/17 1145 08/04/17 1211  BP: (!) 127/54 (!) 127/52 (!) 134/59 (!) 152/67  Pulse: 63 71 67 74  Resp: (!) 25 (!) 24 20 20   Temp: 98.6 F (37 C) 98.6 F (37 C) 99.1 F (37.3 C) 99.5 F (37.5 C)  TempSrc: Oral Oral Oral Oral  SpO2: 97% 98% 98% 96%    Intake/Output Summary (Last 24 hours) at 08/04/2017 1251 Last data filed at 08/04/2017 0700 Gross per 24 hour  Intake 583.75 ml  Output -  Net 583.75 ml   There were no vitals filed for this visit.  Exam:  General: 77 year old Caucasian female well-developed well-nourished in no acute distress.    Cardiovascular: Regular rate and rhythm with no rubs or gallops.  No thyromegaly or JVD noted.  Respiratory: Mild rales at bases.  No wheezes noted.  Abdomen: Soft nontender nondistended with normal bowel sounds x4 quadrants.  Musculoskeletal: No lower extremity edema.  Skin: No ulcerative lesions  noted  Psychiatry: Mood is appropriate for condition and setting   Data Reviewed: CBC: Recent Labs  Lab 07/29/17 0813 08/02/17 2022 08/03/17 0140 08/03/17 1435 08/04/17 0711  WBC 8.2 2.8*  --  5.9 7.0  NEUTROABS 5.8  --  1.8 3.1  --   HGB 10.4* 9.1*  --  7.5* 6.8*  HCT 31.1* 27.8*  --  22.2* 20.4*  MCV 88.4 87.4  --  87.1 87.2  PLT 211 114*  --  62* 52*   Basic Metabolic Panel: Recent Labs  Lab 07/29/17 0813 08/02/17 2022 08/03/17 1435 08/04/17 0711  NA 133* 135 133* 137  K 4.6 4.1 3.6 3.6  CL 100 100* 100* 104  CO2 24 27 23 23   GLUCOSE 163* 109* 148* 119*  BUN 30* 19 18 11   CREATININE 0.82 0.72 0.88 0.86  CALCIUM 9.2 8.7* 7.3* 7.1*   GFR: Estimated Creatinine Clearance: 33.3 mL/min (by C-G formula based on SCr of 0.86 mg/dL). Liver Function Tests: Recent Labs  Lab 07/29/17 0813 08/03/17 0140  AST 16 27  ALT 19 24  ALKPHOS 114 86  BILITOT 0.4 0.4  PROT 6.9 6.2*  ALBUMIN 3.2* 2.7*   No results for input(s): LIPASE, AMYLASE in the last 168 hours. No results for input(s): AMMONIA in the last 168 hours. Coagulation Profile: No results for input(s): INR, PROTIME in the last 168 hours. Cardiac Enzymes: No results for input(s): CKTOTAL, CKMB, CKMBINDEX, TROPONINI in the last 168 hours. BNP (last 3 results) No results for input(s): PROBNP in the last 8760 hours. HbA1C: No results for input(s): HGBA1C in the last 72 hours. CBG: Recent Labs  Lab 08/03/17 2035 08/03/17 2340 08/04/17 0344 08/04/17 0800 08/04/17 1152  GLUCAP 230* 176* 102* 116* 178*   Lipid Profile: No results for input(s): CHOL, HDL, LDLCALC, TRIG, CHOLHDL, LDLDIRECT in the last 72 hours. Thyroid Function Tests: No results for input(s): TSH, T4TOTAL, FREET4, T3FREE, THYROIDAB in the last 72 hours. Anemia Panel: No results for input(s): VITAMINB12, FOLATE, FERRITIN, TIBC, IRON, RETICCTPCT in the last 72 hours. Urine analysis:    Component Value Date/Time   COLORURINE YELLOW  08/03/2017 0532   APPEARANCEUR CLEAR 08/03/2017 0532   LABSPEC 1.018 08/03/2017 0532   PHURINE 6.0 08/03/2017 0532   GLUCOSEU NEGATIVE 08/03/2017 0532   HGBUR NEGATIVE 08/03/2017 0532   BILIRUBINUR NEGATIVE 08/03/2017 0532   KETONESUR NEGATIVE 08/03/2017 0532   PROTEINUR 100 (A) 08/03/2017 0532   UROBILINOGEN 0.2 01/20/2014 1322   NITRITE NEGATIVE 08/03/2017 0532   LEUKOCYTESUR NEGATIVE 08/03/2017 0532   Sepsis Labs: @LABRCNTIP (procalcitonin:4,lacticidven:4)  ) Recent Results (from the past 240 hour(s))  Respiratory Panel by PCR     Status: Abnormal   Collection Time: 08/03/17  5:32 AM  Result Value Ref Range Status   Adenovirus NOT DETECTED NOT DETECTED Final   Coronavirus 229E NOT DETECTED NOT DETECTED Final   Coronavirus HKU1 NOT DETECTED NOT DETECTED Final   Coronavirus NL63 NOT DETECTED NOT DETECTED Final   Coronavirus OC43 NOT DETECTED NOT DETECTED Final   Metapneumovirus NOT DETECTED NOT DETECTED Final   Rhinovirus / Enterovirus DETECTED (A) NOT DETECTED Final  Influenza A NOT DETECTED NOT DETECTED Final   Influenza B NOT DETECTED NOT DETECTED Final   Parainfluenza Virus 1 NOT DETECTED NOT DETECTED Final   Parainfluenza Virus 2 NOT DETECTED NOT DETECTED Final   Parainfluenza Virus 3 NOT DETECTED NOT DETECTED Final   Parainfluenza Virus 4 NOT DETECTED NOT DETECTED Final   Respiratory Syncytial Virus NOT DETECTED NOT DETECTED Final   Bordetella pertussis NOT DETECTED NOT DETECTED Final   Chlamydophila pneumoniae NOT DETECTED NOT DETECTED Final   Mycoplasma pneumoniae NOT DETECTED NOT DETECTED Final    Comment: Performed at [facility], [place] [address], [place], [place] [address]  Culture, sputum-assessment     Status: None   Collection Time: 08/04/17  5:24 AM  Result Value Ref Range Status   Specimen Description SPUTUM  Final   Special Requests NONE  Final   Sputum evaluation   Final    Sputum specimen not acceptable for testing.  Please recollect.   Gram  Stain Report Called to,Read Back By and Verified With: RN Deneen Harts [phone] MLM Performed at [facility], [place] [address] [place], [place] [address]    Report Status 08/04/2017 FINAL  Final      Studies: No results found.  Scheduled Meds: . aspirin EC  81 mg Oral Daily  . atorvastatin  80 mg Oral Daily  . clopidogrel  75 mg Oral Daily  . [START ON 08/05/2017] enoxaparin (LOVENOX) injection  20 mg Subcutaneous Daily  . feeding supplement (ENSURE ENLIVE)  237 mL Oral BID BM  . guaiFENesin  600 mg Oral BID  . insulin aspart  0-9 Units Subcutaneous Q4H  . metoprolol succinate  50 mg Oral Daily    Continuous Infusions: . sodium chloride 75 mL/hr at 08/03/17 1337  . piperacillin-tazobactam (ZOSYN)  IV 3.375 g (08/04/17 1033)  . vancomycin 750 mg (08/04/17 0520)     LOS: 1 day     Kayleen Memos, MD [facility] Pager [phone]  If 7PM-7AM, please contact night-coverage [email or web address] Password [facility] 08/04/2017, 12:51 PM

## 2017-08-05 ENCOUNTER — Other Ambulatory Visit: Payer: Medicare Other

## 2017-08-05 ENCOUNTER — Inpatient Hospital Stay: Payer: Medicare Other

## 2017-08-05 DIAGNOSIS — Z794 Long term (current) use of insulin: Secondary | ICD-10-CM

## 2017-08-05 DIAGNOSIS — E11649 Type 2 diabetes mellitus with hypoglycemia without coma: Secondary | ICD-10-CM

## 2017-08-05 DIAGNOSIS — C3412 Malignant neoplasm of upper lobe, left bronchus or lung: Secondary | ICD-10-CM

## 2017-08-05 LAB — BASIC METABOLIC PANEL
ANION GAP: 9 (ref 5–15)
BUN: 11 mg/dL (ref 6–20)
CO2: 24 mmol/L (ref 22–32)
Calcium: 8.2 mg/dL — ABNORMAL LOW (ref 8.9–10.3)
Chloride: 104 mmol/L (ref 101–111)
Creatinine, Ser: 0.73 mg/dL (ref 0.44–1.00)
GFR calc Af Amer: >60 mL/min (ref >60)
GLUCOSE: 105 mg/dL — AB (ref 65–99)
POTASSIUM: 3.5 mmol/L (ref 3.5–5.1)
Sodium: 137 mmol/L (ref 135–145)

## 2017-08-05 LAB — CBC
HEMATOCRIT: 26.6 % — AB (ref 36.0–46.0)
Hemoglobin: 8.9 g/dL — ABNORMAL LOW (ref 12.0–15.0)
MCH: 28.5 pg (ref 26.0–34.0)
MCHC: 33.5 g/dL (ref 30.0–36.0)
MCV: 85.3 fL (ref 78.0–100.0)
PLATELETS: 54 10*3/uL — AB (ref 150–400)
RBC: 3.12 MIL/uL — AB (ref 3.87–5.11)
RDW: 14.7 % (ref 11.5–15.5)
WBC: 11.2 10*3/uL — ABNORMAL HIGH (ref 4.0–10.5)

## 2017-08-05 LAB — GLUCOSE, CAPILLARY
GLUCOSE-CAPILLARY: 175 mg/dL — AB (ref 65–99)
GLUCOSE-CAPILLARY: 183 mg/dL — AB (ref 65–99)
Glucose-Capillary: 115 mg/dL — ABNORMAL HIGH (ref 65–99)
Glucose-Capillary: 144 mg/dL — ABNORMAL HIGH (ref 65–99)
Glucose-Capillary: 170 mg/dL — ABNORMAL HIGH (ref 65–99)
Glucose-Capillary: 288 mg/dL — ABNORMAL HIGH (ref 65–99)

## 2017-08-05 LAB — FOLATE RBC
FOLATE, HEMOLYSATE: 430.9 ng/mL
Folate, RBC: 1578 ng/mL (ref >498)
Hematocrit: 27.3 % — ABNORMAL LOW (ref 34.0–46.6)

## 2017-08-05 LAB — PROCALCITONIN: PROCALCITONIN: 0.74 ng/mL

## 2017-08-05 MED ORDER — IPRATROPIUM-ALBUTEROL 0.5-2.5 (3) MG/3ML IN SOLN
3.0000 mL | RESPIRATORY_TRACT | Status: DC | PRN
  Administered 2017-08-06: 3 mL via RESPIRATORY_TRACT
  Filled 2017-08-05: qty 3

## 2017-08-05 MED ORDER — IPRATROPIUM-ALBUTEROL 0.5-2.5 (3) MG/3ML IN SOLN
3.0000 mL | Freq: Four times a day (QID) | RESPIRATORY_TRACT | Status: DC
  Administered 2017-08-05: 3 mL via RESPIRATORY_TRACT
  Filled 2017-08-05: qty 3

## 2017-08-05 MED ORDER — GUAIFENESIN 100 MG/5ML PO SOLN
5.0000 mL | ORAL | Status: DC | PRN
  Administered 2017-08-05 – 2017-08-10 (×8): 100 mg via ORAL
  Filled 2017-08-05 (×9): qty 5

## 2017-08-05 MED ORDER — SODIUM CHLORIDE 3 % IN NEBU
4.0000 mL | INHALATION_SOLUTION | Freq: Three times a day (TID) | RESPIRATORY_TRACT | Status: DC
  Administered 2017-08-05: 4 mL via RESPIRATORY_TRACT
  Filled 2017-08-05 (×3): qty 4

## 2017-08-05 MED ORDER — SALINE SPRAY 0.65 % NA SOLN
1.0000 | NASAL | Status: DC | PRN
  Administered 2017-08-05: 1 via NASAL
  Filled 2017-08-05: qty 44

## 2017-08-05 NOTE — Progress Notes (Signed)
PROGRESS NOTE  [name]:[id number] DOB: 1941/03/13 DOA: 08/02/2017 PCP: Prince Solian, MD  HPI/Recap of past 24 hours: Katherine Cole is a 77 y.o. 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  Patient had her first chemotherapy session on 4/16.  Since that time patient had been noted to have fairly good appetite.  However yesterday afternoon patient's blood glucose suddenly dropped to 62.  .  Patient was given food to keep her blood sugar up, but it kept dropping as low as 56.  Patient had associated symptoms of chills and weakness stating that felt as though her legs were shaky.  At that time patient had not had any fever.  Family was advised to bring the patient to the emergency department for further evaluation.  While waiting patient reports developing a dry cough that she thinks that she caught from someone in the waiting room.  Patient had just been hospitalized last month on 3/25 diagnosed with atypical pneumonia found to have left-sided mass that was related to be diagnosed squamous cell.  Denies having any significant rash or swelling around her port site.  08/04/2017: Patient seen and examined with her daughter at bedside.  She has no new complaints.  Hemoglobin noted to be 6.8.  Ordered 2 units of PRBC to be transfused.  After 1 unit of PRBC patient became febrile, hypertensive, and tachypneic.  Will obtain an H&H after 1 unit PRBC transfused.  If needed will give the other unit in the morning.  08/05/17: Patient seen and examined with her daughter at bedside.  She reports cough with difficulty bringing up mucus.  Denies any chest pain or dyspnea.  Assessment/Plan: Principal Problem:   Sepsis due to pneumonia [facility]) Active Problems:   Type 2 diabetes mellitus with hypoglycemia without coma ([place])   Essential hypertension   Primary small cell  carcinoma of left lung (HCC)   Antineoplastic chemotherapy induced pancytopenia (HCC)   Hyperlipemia   Viral pneumonia  Sepsis secondary to HCAP, POA- Added hypersaline nebs and Mucinex for cough Continue IV vancomycin IV Zosyn day#2 Continue DuoNeb treatments every 6 hours and every 2 hours as needed Continue O2 supplementation to maintain saturation 92% or greater Monitor fever curve-afebrile Repeat CBC in the morning  Anemia of chronic disease post 1 unit PRBC Hemoglobin 8.9 from 6.8 No overt sign of bleeding Repeat CBC in the morning  Hypertension Stable Continue home meds  Hyperlipidemia stable Continue home meds  History of left upper lobe lung small cell carcinoma Follow-up with oncology outpatient  Coronary artery disease Stable Continue home meds  Moderate calorie protein malnutrition Abdomen 2.9 Increase calorie protein intake Continue oral supplement  Type 2 diabetes Continue insulin sliding scale   Code Status: Full code  Family Communication: Daughter at bedside  Disposition Plan: Home when clinically stable   Consultants:  None  Procedures:  None  Antimicrobials:  IV vancomycin   IV Zosyn  DVT prophylaxis: Subcu Lovenox   Objective: Vitals:   08/04/17 1627 08/04/17 2027 08/05/17 0455 08/05/17 1233  BP:  (!) 156/66 (!) 162/61   Pulse:  69 63   Resp:  18 16   Temp:  99.8 F (37.7 C) 100.2 F (37.9 C)   TempSrc:  Oral Oral   SpO2:  98% 96% 98%  Weight: 36.3 kg (80 lb)     Height: 5\' 1"  (  1.549 m)       Intake/Output Summary (Last 24 hours) at 08/05/2017 1514 Last data filed at 08/05/2017 0510 Gross per 24 hour  Intake 3337.5 ml  Output 800 ml  Net 2537.5 ml   Filed Weights   08/04/17 1627  Weight: 36.3 kg (80 lb)    Exam:   General: 77 year old Caucasian female well-developed well-nourished no acute distress.  Alert and oriented x3.  Cardiovascular: Regular rate and rhythm with no rubs or gallops.  No  thyromegaly or JVD noted.    Respiratory: Mild rales at bases.  No wheezes.  Poor inspiratory effort.  Abdomen: Soft nontender nondistended with normal bowel sounds x4 quadrants.  Musculoskeletal: No lower extremity edema.  Skin: No ulcerative lesions noted  Psychiatry: Mood is appropriate for condition and setting   Data Reviewed: CBC: Recent Labs  Lab 08/02/17 2022 08/03/17 0140 08/03/17 1435 08/04/17 0711 08/04/17 1704 08/05/17 0342  WBC 2.8*  --  5.9 7.0  --  11.2*  NEUTROABS  --  1.8 3.1  --   --   --   HGB 9.1*  --  7.5* 6.8* 9.3* 8.9*  HCT 27.8*  --  22.2* 20.4* 26.9*  27.3* 26.6*  MCV 87.4  --  87.1 87.2  --  85.3  PLT 114*  --  62* 52*  --  54*   Basic Metabolic Panel: Recent Labs  Lab 08/02/17 2022 08/03/17 1435 08/04/17 0711 08/05/17 0342  NA 135 133* 137 137  K 4.1 3.6 3.6 3.5  CL 100* 100* 104 104  CO2 27 23 23 24   GLUCOSE 109* 148* 119* 105*  BUN 19 18 11 11   CREATININE 0.72 0.88 0.86 0.73  CALCIUM 8.7* 7.3* 7.1* 8.2*   GFR: Estimated Creatinine Clearance: 35.4 mL/min (by C-G formula based on SCr of 0.73 mg/dL). Liver Function Tests: Recent Labs  Lab 08/03/17 0140  AST 27  ALT 24  ALKPHOS 86  BILITOT 0.4  PROT 6.2*  ALBUMIN 2.7*   No results for input(s): LIPASE, AMYLASE in the last 168 hours. No results for input(s): AMMONIA in the last 168 hours. Coagulation Profile: No results for input(s): INR, PROTIME in the last 168 hours. Cardiac Enzymes: No results for input(s): CKTOTAL, CKMB, CKMBINDEX, TROPONINI in the last 168 hours. BNP (last 3 results) No results for input(s): PROBNP in the last 8760 hours. HbA1C: No results for input(s): HGBA1C in the last 72 hours. CBG: Recent Labs  Lab 08/04/17 2023 08/05/17 0023 08/05/17 0452 08/05/17 0757 08/05/17 1240  GLUCAP 219* 170* 115* 175* 183*   Lipid Profile: No results for input(s): CHOL, HDL, LDLCALC, TRIG, CHOLHDL, LDLDIRECT in the last 72 hours. Thyroid Function Tests: No  results for input(s): TSH, T4TOTAL, FREET4, T3FREE, THYROIDAB in the last 72 hours. Anemia Panel: Recent Labs    08/04/17 1704  VITAMINB12 1,277*  FERRITIN 389*  TIBC 165*  IRON 156  RETICCTPCT 1.2   Urine analysis:    Component Value Date/Time   COLORURINE YELLOW 08/03/2017 0532   APPEARANCEUR CLEAR 08/03/2017 0532   LABSPEC 1.018 08/03/2017 0532   PHURINE 6.0 08/03/2017 0532   GLUCOSEU NEGATIVE 08/03/2017 0532   HGBUR NEGATIVE 08/03/2017 0532   BILIRUBINUR NEGATIVE 08/03/2017 0532   KETONESUR NEGATIVE 08/03/2017 0532   PROTEINUR 100 (A) 08/03/2017 0532   UROBILINOGEN 0.2 01/20/2014 1322   NITRITE NEGATIVE 08/03/2017 0532   LEUKOCYTESUR NEGATIVE 08/03/2017 0532   Sepsis Labs: @LABRCNTIP (procalcitonin:4,lacticidven:4)  ) Recent Results (from the past 240 hour(s))  Blood Culture (  routine x 2)     Status: None (Preliminary result)   Collection Time: 08/03/17  1:40 AM  Result Value Ref Range Status   Specimen Description BLOOD RIGHT ARM  Final   Special Requests   Final    BOTTLES DRAWN AEROBIC AND ANAEROBIC Blood Culture adequate volume   Culture   Final    NO GROWTH 2 DAYS Performed at [facility], [address] [place], [place] [address]    Report Status PENDING  Incomplete  Blood Culture (routine x 2)     Status: None (Preliminary result)   Collection Time: 08/03/17  1:41 AM  Result Value Ref Range Status   Specimen Description BLOOD LEFT ARM  Final   Special Requests   Final    BOTTLES DRAWN AEROBIC AND ANAEROBIC Blood Culture adequate volume   Culture   Final    NO GROWTH 2 DAYS Performed at [facility], [place] [address] [place], [place] [address]    Report Status PENDING  Incomplete  Respiratory Panel by PCR     Status: Abnormal   Collection Time: 08/03/17  5:32 AM  Result Value Ref Range Status   Adenovirus NOT DETECTED NOT DETECTED Final   Coronavirus 229E NOT DETECTED NOT DETECTED Final   Coronavirus HKU1 NOT DETECTED NOT DETECTED Final    Coronavirus NL63 NOT DETECTED NOT DETECTED Final   Coronavirus OC43 NOT DETECTED NOT DETECTED Final   Metapneumovirus NOT DETECTED NOT DETECTED Final   Rhinovirus / Enterovirus DETECTED (A) NOT DETECTED Final   Influenza A NOT DETECTED NOT DETECTED Final   Influenza B NOT DETECTED NOT DETECTED Final   Parainfluenza Virus 1 NOT DETECTED NOT DETECTED Final   Parainfluenza Virus 2 NOT DETECTED NOT DETECTED Final   Parainfluenza Virus 3 NOT DETECTED NOT DETECTED Final   Parainfluenza Virus 4 NOT DETECTED NOT DETECTED Final   Respiratory Syncytial Virus NOT DETECTED NOT DETECTED Final   Bordetella pertussis NOT DETECTED NOT DETECTED Final   Chlamydophila pneumoniae NOT DETECTED NOT DETECTED Final   Mycoplasma pneumoniae NOT DETECTED NOT DETECTED Final    Comment: Performed at [facility], [place] [address], [place], [place] [address]  Culture, Urine     Status: None   Collection Time: 08/03/17  7:17 PM  Result Value Ref Range Status   Specimen Description URINE, RANDOM  Final   Special Requests NONE  Final   Culture   Final    NO GROWTH Performed at [facility], [place] [address], [place], [place] [address]    Report Status 08/04/2017 FINAL  Final  Culture, sputum-assessment     Status: None   Collection Time: 08/04/17  5:24 AM  Result Value Ref Range Status   Specimen Description SPUTUM  Final   Special Requests NONE  Final   Sputum evaluation   Final    Sputum specimen not acceptable for testing.  Please recollect.   Gram Stain Report Called to,Read Back By and Verified With: RN Deneen Harts [id number] Performed at [facility], [place] [address], [place], [place] [address]    Report Status 08/04/2017 FINAL  Final  Culture, expectorated sputum-assessment     Status: None   Collection Time: 08/04/17  1:30 PM  Result Value Ref Range Status   Specimen Description SPUTUM  Final   Special Requests NONE  Final   Sputum evaluation   Final    THIS SPECIMEN IS ACCEPTABLE  FOR SPUTUM CULTURE  Performed at [facility], [place] [address], [place], [place] [address]    Report Status 08/04/2017 FINAL  Final  Culture, respiratory (NON-Expectorated)     Status: None (Preliminary result)   Collection Time: 08/04/17  1:30 PM  Result Value Ref Range Status   Specimen Description SPUTUM  Final   Special Requests NONE Reflexed from [id number]  Final   Gram Stain   Final    FEW WBC PRESENT, PREDOMINANTLY PMN RARE GRAM POSITIVE COCCI RARE GRAM NEGATIVE RODS    Culture   Final    CULTURE REINCUBATED FOR BETTER GROWTH Performed at [facility], [place] [address] [place], [place] [address]    Report Status PENDING  Incomplete      Studies: No results found.  Scheduled Meds: . amLODipine  5 mg Oral Daily  . aspirin EC  81 mg Oral Daily  . atorvastatin  80 mg Oral Daily  . clopidogrel  75 mg Oral Daily  . enoxaparin (LOVENOX) injection  20 mg Subcutaneous Daily  . guaiFENesin  600 mg Oral BID  . insulin aspart  0-9 Units Subcutaneous Q4H  . lactose free nutrition  237 mL Oral TID BM  . metoprolol succinate  50 mg Oral Daily  . polyethylene glycol  17 g Oral Daily    Continuous Infusions: . piperacillin-tazobactam (ZOSYN)  IV Stopped (08/05/17 1354)  . vancomycin 750 mg (08/05/17 0510)     LOS: 2 days     Kayleen Memos, MD [facility] Pager [phone]  If 7PM-7AM, please contact night-coverage [email or web address] Password [email or web address] 08/05/2017, 3:14 PM

## 2017-08-05 NOTE — Evaluation (Signed)
Physical Therapy Evaluation Patient Details Name: Katherine Cole MRN: [id number] DOB: 1940/06/03 Today's Date: 08/05/2017   History of Present Illness  Pt is a 77 y/o female admitted secondary to fever and low blood sugar. Pt also with sepsis secondary to HCAP. 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   Clinical Impression  Pt admitted secondary to problem above with deficits below. Pt tolerated gait training well, however, slightly unsteady requiring min to min guard A. Oxygen sats from 93%-94% on RA throughout gait. Educated about energy conservation techniques to use for home. Will continue to follow acutely to maximize functional mobility independence and safety.     Follow Up Recommendations Home health PT;Supervision for mobility/OOB    Equipment Recommendations  None recommended by PT    Recommendations for Other Services       Precautions / Restrictions Precautions Precautions: Fall Restrictions Weight Bearing Restrictions: No      Mobility  Bed Mobility               General bed mobility comments: Sitting EOB upon entry.   Transfers Overall transfer level: Needs assistance Equipment used: None Transfers: Sit to/from Stand Sit to Stand: Min guard         General transfer comment: Min guard for safety. No external assist required.   Ambulation/Gait Ambulation/Gait assistance: Min assist;Min guard Ambulation Distance (Feet): 150 Feet Assistive device: (IV pole ) Gait Pattern/deviations: Step-through pattern;Decreased stride length Gait velocity: Decreased  Gait velocity interpretation: 1.31 - 2.62 ft/sec, indicative of limited community ambulator General Gait Details: Slow, cautious gait. Mild unsteadiness requiring min to min guard A. Educated about using RW when feeling unsteady at home. Oxygen sats from 93%-94% on RA during ambulation.   Stairs            Wheelchair Mobility    Modified Rankin  (Stroke Patients Only)       Balance Overall balance assessment: Needs assistance Sitting-balance support: No upper extremity supported;Feet supported Sitting balance-Leahy Scale: Good     Standing balance support: No upper extremity supported;During functional activity;Single extremity supported Standing balance-Leahy Scale: Fair Standing balance comment: Able to maintain static standing without UE support.                              Pertinent Vitals/Pain Pain Assessment: No/denies pain    Home Living Family/patient expects to be discharged to:: Private residence Living Arrangements: Children;Other relatives Available Help at Discharge: Family Type of Home: House Home Access: Stairs to enter Entrance Stairs-Rails: None Entrance Stairs-Number of Steps: 1(threshold ) Home Layout: One level Home Equipment: Environmental consultant - 2 wheels;Shower seat      Prior Function Level of Independence: Independent               Hand Dominance   Dominant Hand: Right    Extremity/Trunk Assessment   Upper Extremity Assessment Upper Extremity Assessment: Generalized weakness    Lower Extremity Assessment Lower Extremity Assessment: Generalized weakness    Cervical / Trunk Assessment Cervical / Trunk Assessment: Normal  Communication   Communication: No difficulties  Cognition Arousal/Alertness: Awake/alert Behavior During Therapy: WFL for tasks assessed/performed Overall Cognitive Status: Within Functional Limits for tasks assessed  General Comments General comments (skin integrity, edema, etc.): Educated about energy conservation techniques for home.     Exercises     Assessment/Plan    PT Assessment Patient needs continued PT services  PT Problem List Cardiopulmonary status limiting activity;Decreased knowledge of use of DME;Decreased mobility;Decreased balance;Decreased strength       PT Treatment  Interventions DME instruction;Gait training;Stair training;Functional mobility training;Therapeutic activities;Therapeutic exercise;Balance training;Patient/family education    PT Goals (Current goals can be found in the Care Plan section)  Acute Rehab PT Goals Patient Stated Goal: to go home  PT Goal Formulation: With patient Time For Goal Achievement: 08/19/17 Potential to Achieve Goals: Good    Frequency Min 3X/week   Barriers to discharge        Co-evaluation               AM-PAC PT "6 Clicks" Daily Activity  Outcome Measure Difficulty turning over in bed (including adjusting bedclothes, sheets and blankets)?: A Little Difficulty moving from lying on back to sitting on the side of the bed? : A Little Difficulty sitting down on and standing up from a chair with arms (e.g., wheelchair, bedside commode, etc,.)?: Unable Help needed moving to and from a bed to chair (including a wheelchair)?: A Little Help needed walking in hospital room?: A Little Help needed climbing 3-5 steps with a railing? : A Lot 6 Click Score: 15    End of Session   Activity Tolerance: Patient tolerated treatment well Patient left: Other (comment);with family/visitor present(in bathroom ) Nurse Communication: Mobility status PT Visit Diagnosis: Unsteadiness on feet (R26.81);Other abnormalities of gait and mobility (R26.89)    Time: 5638-7564 PT Time Calculation (min) (ACUTE ONLY): 31 min   Charges:   PT Evaluation $PT Eval Low Complexity: 1 Low PT Treatments $Gait Training: 8-22 mins   PT G Codes:        Leighton Ruff, PT, DPT  Acute Rehabilitation Services  Pager: [phone]   Rudean Hitt 08/05/2017, 3:32 PM

## 2017-08-06 LAB — GLUCOSE, CAPILLARY
GLUCOSE-CAPILLARY: 103 mg/dL — AB (ref 65–99)
GLUCOSE-CAPILLARY: 107 mg/dL — AB (ref 65–99)
GLUCOSE-CAPILLARY: 129 mg/dL — AB (ref 65–99)
GLUCOSE-CAPILLARY: 261 mg/dL — AB (ref 65–99)
GLUCOSE-CAPILLARY: 319 mg/dL — AB (ref 65–99)
Glucose-Capillary: 171 mg/dL — ABNORMAL HIGH (ref 65–99)
Glucose-Capillary: 167 mg/dL — ABNORMAL HIGH (ref 65–99)

## 2017-08-06 LAB — CBC
HCT: 29.5 % — ABNORMAL LOW (ref 36.0–46.0)
Hemoglobin: 9.9 g/dL — ABNORMAL LOW (ref 12.0–15.0)
MCH: 29.1 pg (ref 26.0–34.0)
MCHC: 33.6 g/dL (ref 30.0–36.0)
MCV: 86.8 fL (ref 78.0–100.0)
PLATELETS: 64 10*3/uL — AB (ref 150–400)
RBC: 3.4 MIL/uL — ABNORMAL LOW (ref 3.87–5.11)
RDW: 14.7 % (ref 11.5–15.5)
WBC: 11.6 10*3/uL — AB (ref 4.0–10.5)

## 2017-08-06 MED ORDER — CARBAMIDE PEROXIDE 6.5 % OT SOLN
5.0000 [drp] | Freq: Two times a day (BID) | OTIC | Status: DC
  Administered 2017-08-06 – 2017-08-07 (×3): 5 [drp] via OTIC
  Filled 2017-08-06: qty 15

## 2017-08-06 MED ORDER — AMLODIPINE BESYLATE 10 MG PO TABS
10.0000 mg | ORAL_TABLET | Freq: Every day | ORAL | Status: DC
  Administered 2017-08-07 – 2017-08-10 (×4): 10 mg via ORAL
  Filled 2017-08-06 (×4): qty 1

## 2017-08-06 NOTE — Progress Notes (Signed)
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  Ongoing  GOAL:   Patient will meet greater than or equal to 90% of their needs  Progressing  MONITOR:   PO intake, Supplement acceptance, Labs, Weight trends, Skin, I & O's  REASON FOR ASSESSMENT:   Consult, Malnutrition Screening Tool Assessment of nutrition requirement/status  ASSESSMENT:   Katherine Cole is a 77 y.o. 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    Pt receiving nursing care at time of visit.   Pt consuming about 50% of meals. Pt daughter is very attentive and brings pt snacks as well as preferred foods. Pt is also consuming Boost supplements.  Labs reviewed: CBGS: 103-288 (inpatient orders for glycemic control are 0-9 units insulin aspart every 4 hours).   Diet Order:   Diet Order           Diet regular Room service appropriate? Yes; Fluid consistency: Thin  Diet effective now          EDUCATION NEEDS:   Education needs have been addressed  Skin:  Skin Assessment: Reviewed RN Assessment  Last BM:  08/06/17  Height:   Ht Readings from Last 1 Encounters:  08/04/17 5\' 1"  (1.549 m)    Weight:   Wt Readings from Last 1 Encounters:  08/04/17 80 lb (36.3 kg)    Ideal Body Weight:  47.7 kg  BMI:  Body mass index is 15.12 kg/m.  Estimated Nutritional Needs:   Kcal:  1100-1300  Protein:  55-70 grams  Fluid:  > 1.1 L    Iva Montelongo A. Jimmye Norman, RD, LDN, CDE Pager:  [phone] After hours Pager: [phone]

## 2017-08-06 NOTE — Progress Notes (Signed)
PROGRESS NOTE  Katherine Cole QBV:[id number] DOB: 01/10/1941 DOA: 08/02/2017 PCP: Prince Solian, MD  HPI/Recap of past 24 hours: Katherine Cole is a 77 y.o. 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  Patient had her first chemotherapy session on 4/16.  Since that time patient had been noted to have fairly good appetite.  However yesterday afternoon patient's blood glucose suddenly dropped to 62.  .  Patient was given food to keep her blood sugar up, but it kept dropping as low as 56.  Patient had associated symptoms of chills and weakness stating that felt as though her legs were shaky.  At that time patient had not had any fever.  Family was advised to bring the patient to the emergency department for further evaluation.  While waiting patient reports developing a dry cough that she thinks that she caught from someone in the waiting room.  Patient had just been hospitalized last month on 3/25 diagnosed with atypical pneumonia found to have left-sided mass that was related to be diagnosed squamous cell.  Denies having any significant rash or swelling around her port site.  08/04/2017: Patient seen and examined with her daughter at bedside.  She has no new complaints.  Hemoglobin noted to be 6.8.  Ordered 2 units of PRBC to be transfused.  After 1 unit of PRBC patient became febrile, hypertensive, and tachypneic.  Will obtain an H&H after 1 unit PRBC transfused.  If needed will give the other unit in the morning.  08/05/17: Patient seen and examined with her daughter at bedside.  She reports cough with difficulty bringing up mucus.  Denies any chest pain or dyspnea.  08/06/17: Patient seen and examined with her daughter at bedside.  She reports cough is improved.  Denies chest pain.  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)   Antineoplastic chemotherapy induced pancytopenia (HCC)   Hyperlipemia   Viral pneumonia  Sepsis secondary to HCAP, POA- HCAP is stable Continue hypersaline nebs and Mucinex for cough Continue IV vancomycin IV Zosyn day number 3 out of 5 Continue DuoNeb treatments every 6 hours and every 2 hours as needed Continue O2 supplementation to maintain saturation 92% or greater Monitor fever curve-afebrile Repeat CBC in the morning  Anemia of chronic disease post 1 unit PRBC Anemia of chronic disease is stable Hemoglobin 9.9 today 08/06/2017 Hemoglobin 8.9 08/05/2017 from 6.8 No overt sign of bleeding Repeat CBC in the morning  Hypertension Blood pressures well controlled Continue home meds  Hyperlipidemia  Hyperlipidemia stable Continue home meds  History of left upper lobe lung small cell carcinoma Patient will follow-up with oncology outpatient  Coronary artery disease Coronary artery disease is stable Continue home meds  Moderate calorie protein malnutrition Abdomen 2.9 Increase calorie protein intake Continue oral supplement  Type 2 diabetes Diabetes, blood sugars are stable Continue insulin sliding scale   Code Status: Full code  Family Communication: Daughter at bedside  Disposition Plan: Home when clinically stable   Consultants:  None  Procedures:  None  Antimicrobials:  IV vancomycin   IV Zosyn  DVT prophylaxis: Subcu Lovenox   Objective: Vitals:   08/05/17 2005 08/06/17 0456 08/06/17 1012 08/06/17 1018  BP: (!) 155/66 (!) 154/56 (!) 143/69   Pulse: 64 (!) 54 (!) 59 62  Resp: 16 16    Temp: 99.4 F (37.4 C) 98.4 F (36.9 C)    TempSrc: Oral Oral    SpO2: 97% 95%    Weight:      Height:        Intake/Output Summary (Last 24 hours) at 08/06/2017 1101 Last data filed at 08/06/2017  0900 Gross per 24 hour  Intake 890 ml  Output 900 ml  Net -10 ml   Filed Weights   08/04/17 1627  Weight: 36.3 kg (80 lb)    Exam:   General: 77 year old Caucasian female frail in no acute distress.  Alert and oriented x3  Cardiovascular: Regular rate and rhythm with no rubs or gallops.  No thyromegaly or JVD noted.    Respiratory: Mild rales at bases with mild wheezes noted.  Abdomen: Soft nontender nondistended with normal bowel sounds x4 quadrants.  Musculoskeletal: No lower extremity edema.  Skin: No ulcerative lesions noted  Psychiatry: Mood is appropriate for condition and setting   Data Reviewed: CBC: Recent Labs  Lab 08/02/17 2022 08/03/17 0140 08/03/17 1435 08/04/17 0711 08/04/17 1704 08/05/17 0342  WBC 2.8*  --  5.9 7.0  --  11.2*  NEUTROABS  --  1.8 3.1  --   --   --   HGB 9.1*  --  7.5* 6.8* 9.3* 8.9*  HCT 27.8*  --  22.2* 20.4* 26.9*  27.3* 26.6*  MCV 87.4  --  87.1 87.2  --  85.3  PLT 114*  --  62* 52*  --  54*   Basic Metabolic Panel: Recent Labs  Lab 08/02/17 2022 08/03/17 1435 08/04/17 0711 08/05/17 0342  NA 135 133* 137 137  K 4.1 3.6 3.6 3.5  CL 100* 100* 104 104  CO2 27 23 23 24   GLUCOSE 109* 148* 119* 105*  BUN 19 18 11 11   CREATININE 0.72 0.88 0.86 0.73  CALCIUM 8.7* 7.3* 7.1* 8.2*   GFR: Estimated Creatinine Clearance: 35.4 mL/min (by C-G formula based on SCr of 0.73 mg/dL). Liver Function Tests: Recent Labs  Lab 08/03/17 0140  AST 27  ALT 24  ALKPHOS 86  BILITOT 0.4  PROT 6.2*  ALBUMIN 2.7*   No results for input(s): LIPASE, AMYLASE in the last 168 hours. No results for input(s): AMMONIA in the last 168 hours. Coagulation Profile: No results for input(s): INR, PROTIME in the last 168 hours. Cardiac Enzymes: No results for input(s): CKTOTAL, CKMB, CKMBINDEX, TROPONINI in the last 168 hours. BNP (last 3 results) No results for input(s): PROBNP in the last 8760 hours. HbA1C: No results for input(s): HGBA1C in  the last 72 hours. CBG: Recent Labs  Lab 08/05/17 1628 08/05/17 2007 08/06/17 0032 08/06/17 0458 08/06/17 0758  GLUCAP 288* 144* 103* 107* 129*   Lipid Profile: No results for input(s): CHOL, HDL, LDLCALC, TRIG, CHOLHDL, LDLDIRECT in the last 72 hours. Thyroid Function Tests: No results for input(s): TSH, T4TOTAL, FREET4, T3FREE, THYROIDAB in the last 72 hours. Anemia Panel: Recent Labs    08/04/17 1704  VITAMINB12 1,277*  FERRITIN 389*  TIBC 165*  IRON 156  RETICCTPCT 1.2   Urine analysis:    Component Value Date/Time   COLORURINE YELLOW 08/03/2017 0532   APPEARANCEUR CLEAR 08/03/2017 0532   LABSPEC 1.018 08/03/2017 0532   PHURINE 6.0 08/03/2017 0532   GLUCOSEU NEGATIVE 08/03/2017 0532   HGBUR NEGATIVE 08/03/2017 0532   BILIRUBINUR NEGATIVE  08/03/2017 0532   KETONESUR NEGATIVE 08/03/2017 0532   PROTEINUR 100 (A) 08/03/2017 0532   UROBILINOGEN 0.2 01/20/2014 1322   NITRITE NEGATIVE 08/03/2017 0532   LEUKOCYTESUR NEGATIVE 08/03/2017 0532   Sepsis Labs: @LABRCNTIP (procalcitonin:4,lacticidven:4)  ) Recent Results (from the past 240 hour(s))  Blood Culture (routine x 2)     Status: None (Preliminary result)   Collection Time: 08/03/17  1:40 AM  Result Value Ref Range Status   Specimen Description BLOOD RIGHT ARM  Final   Special Requests   Final    BOTTLES DRAWN AEROBIC AND ANAEROBIC Blood Culture adequate volume   Culture   Final    NO GROWTH 2 DAYS Performed at [facility], [place] [address], [place], [place] [address]    Report Status PENDING  Incomplete  Blood Culture (routine x 2)     Status: None (Preliminary result)   Collection Time: 08/03/17  1:41 AM  Result Value Ref Range Status   Specimen Description BLOOD LEFT ARM  Final   Special Requests   Final    BOTTLES DRAWN AEROBIC AND ANAEROBIC Blood Culture adequate volume   Culture   Final    NO GROWTH 2 DAYS Performed at [facility], [place] [address] [place], [place] [address]    Report  Status PENDING  Incomplete  Respiratory Panel by PCR     Status: Abnormal   Collection Time: 08/03/17  5:32 AM  Result Value Ref Range Status   Adenovirus NOT DETECTED NOT DETECTED Final   Coronavirus 229E NOT DETECTED NOT DETECTED Final   Coronavirus HKU1 NOT DETECTED NOT DETECTED Final   Coronavirus NL63 NOT DETECTED NOT DETECTED Final   Coronavirus OC43 NOT DETECTED NOT DETECTED Final   Metapneumovirus NOT DETECTED NOT DETECTED Final   Rhinovirus / Enterovirus DETECTED (A) NOT DETECTED Final   Influenza A NOT DETECTED NOT DETECTED Final   Influenza B NOT DETECTED NOT DETECTED Final   Parainfluenza Virus 1 NOT DETECTED NOT DETECTED Final   Parainfluenza Virus 2 NOT DETECTED NOT DETECTED Final   Parainfluenza Virus 3 NOT DETECTED NOT DETECTED Final   Parainfluenza Virus 4 NOT DETECTED NOT DETECTED Final   Respiratory Syncytial Virus NOT DETECTED NOT DETECTED Final   Bordetella pertussis NOT DETECTED NOT DETECTED Final   Chlamydophila pneumoniae NOT DETECTED NOT DETECTED Final   Mycoplasma pneumoniae NOT DETECTED NOT DETECTED Final    Comment: Performed at [facility], [place] [address], [place], [place] [address]  Culture, Urine     Status: None   Collection Time: 08/03/17  7:17 PM  Result Value Ref Range Status   Specimen Description URINE, RANDOM  Final   Special Requests NONE  Final   Culture   Final    NO GROWTH Performed at [facility], [place] [address] [place], [place] [address]    Report Status 08/04/2017 FINAL  Final  Culture, sputum-assessment     Status: None   Collection Time: 08/04/17  5:24 AM  Result Value Ref Range Status   Specimen Description SPUTUM  Final   Special Requests NONE  Final   Sputum evaluation   Final    Sputum specimen not acceptable for testing.  Please recollect.   Gram Stain Report Called to,Read Back By and Verified With: RN Deneen Harts [id number] Performed at [facility], [place] [address] [place], [place] [address]     Report Status 08/04/2017 FINAL  Final  Culture, expectorated sputum-assessment  Status: None   Collection Time: 08/04/17  1:30 PM  Result Value Ref Range Status   Specimen Description SPUTUM  Final   Special Requests NONE  Final   Sputum evaluation   Final    THIS SPECIMEN IS ACCEPTABLE FOR SPUTUM CULTURE Performed at [facility], [address] [place], [place] [address]    Report Status 08/04/2017 FINAL  Final  Culture, respiratory (NON-Expectorated)     Status: None (Preliminary result)   Collection Time: 08/04/17  1:30 PM  Result Value Ref Range Status   Specimen Description SPUTUM  Final   Special Requests NONE Reflexed from [id number]  Final   Gram Stain   Final    FEW WBC PRESENT, PREDOMINANTLY PMN RARE GRAM POSITIVE COCCI RARE GRAM NEGATIVE RODS    Culture   Final    FEW Consistent with normal respiratory flora. Performed at [facility], [place] [address], [place],  [address]    Report Status PENDING  Incomplete      Studies: No results found.  Scheduled Meds: . [START ON 08/07/2017] amLODipine  10 mg Oral Daily  . aspirin EC  81 mg Oral Daily  . atorvastatin  80 mg Oral Daily  . carbamide peroxide  5 drop Both EARS BID  . clopidogrel  75 mg Oral Daily  . enoxaparin (LOVENOX) injection  20 mg Subcutaneous Daily  . guaiFENesin  600 mg Oral BID  . insulin aspart  0-9 Units Subcutaneous Q4H  . lactose free nutrition  237 mL Oral TID BM  . metoprolol succinate  50 mg Oral Daily  . polyethylene glycol  17 g Oral Daily    Continuous Infusions: . piperacillin-tazobactam (ZOSYN)  IV 3.375 g (08/06/17 1013)  . vancomycin 750 mg (08/06/17 0542)     LOS: 3 days     Kayleen Memos, MD [facility] Pager [phone]  If 7PM-7AM, please contact night-coverage [email or web address] Password [email or web address] 08/06/2017, 11:01 AM

## 2017-08-06 NOTE — Plan of Care (Signed)
  Problem: Clinical Measurements: Goal: Ability to maintain clinical measurements within normal limits will improve Outcome: Progressing   Problem: Activity: Goal: Risk for activity intolerance will decrease Outcome: Progressing   Problem: Safety: Goal: Ability to remain free from injury will improve Outcome: Progressing   Problem: Education: Goal: Knowledge of General Education information will improve Outcome: Adequate for Discharge   Problem: Elimination: Goal: Will not experience complications related to bowel motility Outcome: Adequate for Discharge Goal: Will not experience complications related to urinary retention Outcome: Adequate for Discharge

## 2017-08-06 NOTE — Progress Notes (Signed)
Pharmacy Antibiotic Note  Katherine Cole is a 77 y.o. female admitted on 08/02/2017 with low blood sugar.   Pharmacy has been consulted for vancomycin and Zosyn dosing for sepsis secondary to HAP.  Renal function stable, afebrile, WBC up 11.2, PCT 0.74.   Plan: Vanc 750mg  IV Q24H Zosyn EID 3.375gm IV Q8H Monitor renal fxn, clinical progress, vanc trough tomorrow if still on therapy   Height: 5\' 1"  (154.9 cm)(07/29/17) Weight: 80 lb (36.3 kg)(07/29/17) IBW/kg (Calculated) : 47.8  Temp (24hrs), Avg:98.6 F (37 C), Min:98.1 F (36.7 C), Max:99.4 F (37.4 C)  Recent Labs  Lab 08/02/17 2022 08/02/17 2038 08/03/17 0149 08/03/17 0415 08/03/17 1435 08/04/17 0711 08/05/17 0342  WBC 2.8*  --   --   --  5.9 7.0 11.2*  CREATININE 0.72  --   --   --  0.88 0.86 0.73  LATICACIDVEN  --  1.74 0.98 1.17  --   --   --     Estimated Creatinine Clearance: 35.4 mL/min (by C-G formula based on SCr of 0.73 mg/dL).    Allergies  Allergen Reactions  . Codeine Other (See Comments)    "gives me a migraine" (03/11/2012)  . Morphine And Related Other (See Comments)    "gives me a migraine" (03/11/2012)     Vanc 4/28 >> Zosyn 4/28 >>  4/28 BCx - NGTD 4/28 resp panel PCR - Rhinovirus/Enterovirus 4/28 UCx - negative 4/29 sputum - GPC / GNR on Gram stain   Leonardo Makris D. Mina Marble, PharmD, BCPS, BCCCP Pager:  [phone] 08/06/2017, 10:50 AM

## 2017-08-07 ENCOUNTER — Inpatient Hospital Stay (HOSPITAL_COMMUNITY): Payer: Medicare Other

## 2017-08-07 LAB — BASIC METABOLIC PANEL
ANION GAP: 7 (ref 5–15)
BUN: 7 mg/dL (ref 6–20)
CHLORIDE: 105 mmol/L (ref 101–111)
CO2: 26 mmol/L (ref 22–32)
Calcium: 8.3 mg/dL — ABNORMAL LOW (ref 8.9–10.3)
Creatinine, Ser: 0.79 mg/dL (ref 0.44–1.00)
GFR calc Af Amer: >60 mL/min (ref >60)
Glucose, Bld: 124 mg/dL — ABNORMAL HIGH (ref 65–99)
POTASSIUM: 3.5 mmol/L (ref 3.5–5.1)
SODIUM: 138 mmol/L (ref 135–145)

## 2017-08-07 LAB — GLUCOSE, CAPILLARY
GLUCOSE-CAPILLARY: 115 mg/dL — AB (ref 65–99)
GLUCOSE-CAPILLARY: 119 mg/dL — AB (ref 65–99)
GLUCOSE-CAPILLARY: 136 mg/dL — AB (ref 65–99)
GLUCOSE-CAPILLARY: 305 mg/dL — AB (ref 65–99)
GLUCOSE-CAPILLARY: 307 mg/dL — AB (ref 65–99)
Glucose-Capillary: 208 mg/dL — ABNORMAL HIGH (ref 65–99)

## 2017-08-07 LAB — CBC
HCT: 27 % — ABNORMAL LOW (ref 36.0–46.0)
HEMOGLOBIN: 9 g/dL — AB (ref 12.0–15.0)
MCH: 28.9 pg (ref 26.0–34.0)
MCHC: 33.3 g/dL (ref 30.0–36.0)
MCV: 86.8 fL (ref 78.0–100.0)
Platelets: 71 10*3/uL — ABNORMAL LOW (ref 150–400)
RBC: 3.11 MIL/uL — AB (ref 3.87–5.11)
RDW: 14.7 % (ref 11.5–15.5)
WBC: 12.8 10*3/uL — AB (ref 4.0–10.5)

## 2017-08-07 LAB — CULTURE, RESPIRATORY W GRAM STAIN: Culture: NORMAL

## 2017-08-07 LAB — CULTURE, RESPIRATORY

## 2017-08-07 MED ORDER — IPRATROPIUM-ALBUTEROL 0.5-2.5 (3) MG/3ML IN SOLN
3.0000 mL | Freq: Four times a day (QID) | RESPIRATORY_TRACT | Status: DC
  Administered 2017-08-07 (×2): 3 mL via RESPIRATORY_TRACT
  Filled 2017-08-07 (×2): qty 3

## 2017-08-07 NOTE — Progress Notes (Signed)
Physical Therapy Treatment Patient Details Name: Katherine Cole MRN: [id number] DOB: 1940-10-05 Today's Date: 08/07/2017    History of Present Illness Pt is a 77 y/o female admitted secondary to fever and low blood sugar. Pt also with sepsis secondary to HCAP. 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     PT Comments    Pt progressing well towards goals. Much improved tolerance for gait; oxygen sats from 97-98% on RA. Pt still with mild unsteadiness requiring min guard to supervision for safety. Educated about LE/UE HEP. Current recommendations appropriate. Will continue to follow acutely to maximize functional mobility independence and safety.   Follow Up Recommendations  Home health PT;Supervision for mobility/OOB     Equipment Recommendations  None recommended by PT    Recommendations for Other Services       Precautions / Restrictions Precautions Precautions: Fall Restrictions Weight Bearing Restrictions: No    Mobility  Bed Mobility               General bed mobility comments: Sitting at EOB upon entry   Transfers Overall transfer level: Needs assistance Equipment used: None Transfers: Sit to/from Stand Sit to Stand: Supervision         General transfer comment: Supervision for safety.   Ambulation/Gait Ambulation/Gait assistance: Supervision;Min guard Ambulation Distance (Feet): 400 Feet Assistive device: (IV pole ) Gait Pattern/deviations: Step-through pattern;Decreased stride length Gait velocity: Decreased  Gait velocity interpretation: 1.31 - 2.62 ft/sec, indicative of limited community ambulator General Gait Details: Improved steadiness noted, however, mild unsteadiness still noted. Required min guard to supervision for safety. Very mild SOB noted and oxygen sats from 97%-98% during gait.    Stairs             Wheelchair Mobility    Modified Rankin (Stroke Patients Only)       Balance  Overall balance assessment: Needs assistance Sitting-balance support: No upper extremity supported;Feet supported Sitting balance-Leahy Scale: Good     Standing balance support: No upper extremity supported;During functional activity;Single extremity supported Standing balance-Leahy Scale: Fair                              Cognition Arousal/Alertness: Awake/alert Behavior During Therapy: WFL for tasks assessed/performed Overall Cognitive Status: Within Functional Limits for tasks assessed                                        Exercises      General Comments General comments (skin integrity, edema, etc.): Pt's daughter present during session. Discussed LE and UE exercise program to perform throughout the day including heel slides, LAQ, ankle pumps, and shoulder flexion with deep breathing exercise.       Pertinent Vitals/Pain Pain Assessment: No/denies pain    Home Living                      Prior Function            PT Goals (current goals can now be found in the care plan section) Acute Rehab PT Goals Patient Stated Goal: to go home  PT Goal Formulation: With patient Time For Goal Achievement: 08/19/17 Potential to Achieve Goals: Good Progress towards PT goals: Progressing toward goals    Frequency    Min 3X/week  PT Plan Current plan remains appropriate    Co-evaluation              AM-PAC PT "6 Clicks" Daily Activity  Outcome Measure  Difficulty turning over in bed (including adjusting bedclothes, sheets and blankets)?: A Little Difficulty moving from lying on back to sitting on the side of the bed? : A Little Difficulty sitting down on and standing up from a chair with arms (e.g., wheelchair, bedside commode, etc,.)?: A Little Help needed moving to and from a bed to chair (including a wheelchair)?: A Little Help needed walking in hospital room?: A Little Help needed climbing 3-5 steps with a railing? : A  Lot 6 Click Score: 17    End of Session   Activity Tolerance: Patient tolerated treatment well Patient left: in chair;with call bell/phone within reach;with family/visitor present Nurse Communication: Mobility status PT Visit Diagnosis: Unsteadiness on feet (R26.81);Other abnormalities of gait and mobility (R26.89)     Time: 0383-3383 PT Time Calculation (min) (ACUTE ONLY): 15 min  Charges:  $Gait Training: 8-22 mins                    G Codes:       Leighton Ruff, PT, DPT  Acute Rehabilitation Services  Pager: [phone]    Rudean Hitt 08/07/2017, 11:26 AM

## 2017-08-07 NOTE — Progress Notes (Signed)
PROGRESS NOTE  Katherine Cole DOB: 08/28/1940 DOA: 08/02/2017 PCP: Prince Solian, MD  HPI/Recap of past 24 hours: Katherine Cole is a 77 y.o. 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  Patient had her first chemotherapy session on 4/16.  Since that time patient had been noted to have fairly good appetite.  However yesterday afternoon patient's blood glucose suddenly dropped to 62.  .  Patient was given food to keep her blood sugar up, but it kept dropping as low as 56.  Patient had associated symptoms of chills and weakness stating that felt as though her legs were shaky.  At that time patient had not had any fever.  Family was advised to bring the patient to the emergency department for further evaluation.  While waiting patient reports developing a dry cough that she thinks that she caught from someone in the waiting room.  Patient had just been hospitalized last month on 3/25 diagnosed with atypical pneumonia found to have left-sided mass that was related to be diagnosed squamous cell.  Denies having any significant rash or swelling around her port site.  08/04/2017: Patient seen and examined with her daughter at bedside.  She has no new complaints.  Hemoglobin noted to be 6.8.  Ordered 2 units of PRBC to be transfused.  After 1 unit of PRBC patient became febrile, hypertensive, and tachypneic.  Will obtain an H&H after 1 unit PRBC transfused.  If needed will give the other unit in the morning.  08/05/17: Patient seen and examined with her daughter at bedside.  She reports cough with difficulty bringing up mucus.  Denies any chest pain or dyspnea.  08/06/17: Patient seen and examined with her daughter at bedside.  She reports cough is improved.  Denies chest pain.  08/07/17: Seen and examined with her daughter at bedside.  Reports persistent cough.   Afebrile in the last 48 hours.  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)   Hyperlipemia   Viral pneumonia  Sepsis secondary to HCAP, POA- HCAP is resolving Stop IV antibiotics.  Completed 5 days of IV vancomycin and IV Zosyn. Continue hypersaline nebs and Mucinex for cough Continue DuoNeb treatments every 6 hours and every 2 hours as needed Continue O2 supplementation to maintain saturation 92% or greater Afebrile in the last 48 hours.  Leukocytosis Suspect secondary to her cancer Contact Dr. Earlie Server for follow-up  Anemia of chronic disease post 1 unit PRBC Anemia stable No overt sign of bleeding  Hypertension Blood pressures well controlled Continue home meds  Hyperlipidemia  Hyperlipidemia stable Continue home meds  History of left upper lobe lung small cell carcinoma Patient will follow-up with oncology outpatient  Coronary artery disease Coronary artery disease is stable Continue home meds  Moderate calorie protein malnutrition Abdomen 2.9 Increase calorie protein intake Continue oral supplement  Type 2 diabetes Diabetes, blood sugars are stable Continue insulin sliding scale   Code Status: Full code  Family Communication: Daughter at bedside  Disposition Plan: Home when clinically stable   Consultants:  None  Procedures:  None  Antimicrobials: None  DVT prophylaxis: Subcu Lovenox   Objective: Vitals:  08/07/17 0412 08/07/17 1052 08/07/17 1237 08/07/17 1438  BP: (!) 158/68 (!) 135/94  (!) 135/56  Pulse: (!) 53 60  61  Resp: 16     Temp: 98.2 F (36.8 C)   98.9 F (37.2 C)  TempSrc: Oral   Oral  SpO2: 98%  96% 97%  Weight:      Height:        Intake/Output Summary (Last 24 hours) at  08/07/2017 1529 Last data filed at 08/07/2017 0602 Gross per 24 hour  Intake 400 ml  Output 300 ml  Net 100 ml   Filed Weights   08/04/17 1627  Weight: 36.3 kg (80 lb)    Exam:   General: 77 year old Caucasian female frail in no acute distress.  Alert and oriented x3.  Cardiovascular: Regular rate and rhythm with no rubs or gallops.  No thyromegaly or JVD noted.    Respiratory: clear to auscultation with no wheezes or rales.    Abdomen: Soft nontender nondistended with normal bowel sounds x4 quadrants.  Musculoskeletal: No lower extremity edema.  Skin: No ulcerative lesions noted  Psychiatry: Mood is appropriate for condition and setting   Data Reviewed: CBC: Recent Labs  Lab 08/03/17 0140 08/03/17 1435 08/04/17 0711 08/04/17 1704 08/05/17 0342 08/06/17 1036 08/07/17 0432  WBC  --  5.9 7.0  --  11.2* 11.6* 12.8*  NEUTROABS 1.8 3.1  --   --   --   --   --   HGB  --  7.5* 6.8* 9.3* 8.9* 9.9* 9.0*  HCT  --  22.2* 20.4* 26.9*  27.3* 26.6* 29.5* 27.0*  MCV  --  87.1 87.2  --  85.3 86.8 86.8  PLT  --  62* 52*  --  54* 64* 71*   Basic Metabolic Panel: Recent Labs  Lab 08/02/17 2022 08/03/17 1435 08/04/17 0711 08/05/17 0342 08/07/17 0432  NA 135 133* 137 137 138  K 4.1 3.6 3.6 3.5 3.5  CL 100* 100* 104 104 105  CO2 27 23 23 24 26   GLUCOSE 109* 148* 119* 105* 124*  BUN 19 18 11 11 7   CREATININE 0.72 0.88 0.86 0.73 0.79  CALCIUM 8.7* 7.3* 7.1* 8.2* 8.3*   GFR: Estimated Creatinine Clearance: 35.4 mL/min (by C-G formula based on SCr of 0.79 mg/dL). Liver Function Tests: Recent Labs  Lab 08/03/17 0140  AST 27  ALT 24  ALKPHOS 86  BILITOT 0.4  PROT 6.2*  ALBUMIN 2.7*   No results for input(s): LIPASE, AMYLASE in the last 168 hours. No results for input(s): AMMONIA in the last 168 hours. Coagulation Profile: No results for input(s): INR, PROTIME in the last 168 hours. Cardiac Enzymes: No results for input(s): CKTOTAL, CKMB, CKMBINDEX, TROPONINI in the  last 168 hours. BNP (last 3 results) No results for input(s): PROBNP in the last 8760 hours. HbA1C: No results for input(s): HGBA1C in the last 72 hours. CBG: Recent Labs  Lab 08/06/17 2202 08/07/17 0025 08/07/17 0411 08/07/17 0815 08/07/17 1153  GLUCAP 167* 115* 119* 136* 208*   Lipid Profile: No results for input(s): CHOL, HDL, LDLCALC, TRIG, CHOLHDL, LDLDIRECT in the last 72 hours. Thyroid Function Tests: No results for input(s): TSH, T4TOTAL, FREET4, T3FREE, THYROIDAB in the last 72 hours. Anemia Panel: Recent Labs    08/04/17 1704  VITAMINB12 1,277*  FERRITIN 389*  TIBC 165*  IRON 156  RETICCTPCT 1.2   Urine analysis:    Component Value Date/Time   COLORURINE YELLOW 08/03/2017 0532   APPEARANCEUR CLEAR  08/03/2017 0532   LABSPEC 1.018 08/03/2017 0532   PHURINE 6.0 08/03/2017 0532   GLUCOSEU NEGATIVE 08/03/2017 0532   HGBUR NEGATIVE 08/03/2017 0532   BILIRUBINUR NEGATIVE 08/03/2017 0532   KETONESUR NEGATIVE 08/03/2017 0532   PROTEINUR 100 (A) 08/03/2017 0532   UROBILINOGEN 0.2 01/20/2014 1322   NITRITE NEGATIVE 08/03/2017 0532   LEUKOCYTESUR NEGATIVE 08/03/2017 0532   Sepsis Labs: @LABRCNTIP (procalcitonin:4,lacticidven:4)  ) Recent Results (from the past 240 hour(s))  Blood Culture (routine x 2)     Status: None (Preliminary result)   Collection Time: 08/03/17  1:40 AM  Result Value Ref Range Status   Specimen Description BLOOD RIGHT ARM  Final   Special Requests   Final    BOTTLES DRAWN AEROBIC AND ANAEROBIC Blood Culture adequate volume   Culture   Final    NO GROWTH 4 DAYS Performed at [facility], [place] [address], [place], [place] [address]    Report Status PENDING  Incomplete  Blood Culture (routine x 2)     Status: None (Preliminary result)   Collection Time: 08/03/17  1:41 AM  Result Value Ref Range Status   Specimen Description BLOOD LEFT ARM  Final   Special Requests   Final    BOTTLES DRAWN AEROBIC AND ANAEROBIC Blood Culture  adequate volume   Culture   Final    NO GROWTH 4 DAYS Performed at [facility], [place] [address] [place], [place] [address]    Report Status PENDING  Incomplete  Respiratory Panel by PCR     Status: Abnormal   Collection Time: 08/03/17  5:32 AM  Result Value Ref Range Status   Adenovirus NOT DETECTED NOT DETECTED Final   Coronavirus 229E NOT DETECTED NOT DETECTED Final   Coronavirus HKU1 NOT DETECTED NOT DETECTED Final   Coronavirus NL63 NOT DETECTED NOT DETECTED Final   Coronavirus OC43 NOT DETECTED NOT DETECTED Final   Metapneumovirus NOT DETECTED NOT DETECTED Final   Rhinovirus / Enterovirus DETECTED (A) NOT DETECTED Final   Influenza A NOT DETECTED NOT DETECTED Final   Influenza B NOT DETECTED NOT DETECTED Final   Parainfluenza Virus 1 NOT DETECTED NOT DETECTED Final   Parainfluenza Virus 2 NOT DETECTED NOT DETECTED Final   Parainfluenza Virus 3 NOT DETECTED NOT DETECTED Final   Parainfluenza Virus 4 NOT DETECTED NOT DETECTED Final   Respiratory Syncytial Virus NOT DETECTED NOT DETECTED Final   Bordetella pertussis NOT DETECTED NOT DETECTED Final   Chlamydophila pneumoniae NOT DETECTED NOT DETECTED Final   Mycoplasma pneumoniae NOT DETECTED NOT DETECTED Final    Comment: Performed at [facility], [place] [address], [place], [place] [address]  Culture, Urine     Status: None   Collection Time: 08/03/17  7:17 PM  Result Value Ref Range Status   Specimen Description URINE, RANDOM  Final   Special Requests NONE  Final   Culture   Final    NO GROWTH Performed at [facility], [place] [address], [place], [place] [address]    Report Status 08/04/2017 FINAL  Final  Culture, sputum-assessment     Status: None   Collection Time: 08/04/17  5:24 AM  Result Value Ref Range Status   Specimen Description SPUTUM  Final   Special Requests NONE  Final   Sputum evaluation   Final    Sputum specimen not acceptable for testing.  Please recollect.   Gram Stain Report Called  to,Read Back By and Verified With: RN Deneen Harts  [id number] Performed at [facility], [place] [address] [place], [place] [address]    Report Status 08/04/2017 FINAL  Final  Culture, expectorated sputum-assessment     Status: None   Collection Time: 08/04/17  1:30 PM  Result Value Ref Range Status   Specimen Description SPUTUM  Final   Special Requests NONE  Final   Sputum evaluation   Final    THIS SPECIMEN IS ACCEPTABLE FOR SPUTUM CULTURE Performed at [facility], [place] [address], [place], [place] [address]    Report Status 08/04/2017 FINAL  Final  Culture, respiratory (NON-Expectorated)     Status: None   Collection Time: 08/04/17  1:30 PM  Result Value Ref Range Status   Specimen Description SPUTUM  Final   Special Requests NONE Reflexed from [id number]  Final   Gram Stain   Final    FEW WBC PRESENT, PREDOMINANTLY PMN RARE GRAM POSITIVE COCCI RARE GRAM NEGATIVE RODS    Culture   Final    FEW Consistent with normal respiratory flora. Performed at [facility], [place] [address], [place], [place] [address]    Report Status 08/07/2017 FINAL  Final      Studies: Dg Chest Port 1 View  Result Date: 08/07/2017 CLINICAL DATA:  Wheezing. EXAM: PORTABLE CHEST 1 VIEW COMPARISON:  08/02/2017 FINDINGS: A right jugular Port-A-Cath terminates over the mid SVC. The cardiac silhouette is normal in size. Mild left hilar prominence corresponds to known lymphadenopathy. Aortic atherosclerosis and coronary artery stents are noted. The lungs remain hyperinflated. Peripheral opacity in the lateral left mid lung is slightly more prominent and corresponds to the previously demonstrated lung cancer. There is peribronchial scratched of there is increased peribronchial thickening and interstitial accentuation bilaterally with mild patchy airspace opacity in the right midlung and both lung bases. No sizable pleural effusion or pneumothorax is identified. No acute osseous abnormality is seen.  IMPRESSION: Increasing bilateral interstitial and patchy airspace opacities concerning for worsening pneumonia. Electronically Signed   By: Logan Bores M.D.   On: 08/07/2017 09:30    Scheduled Meds: . amLODipine  10 mg Oral Daily  . aspirin EC  81 mg Oral Daily  . atorvastatin  80 mg Oral Daily  . carbamide peroxide  5 drop Both EARS BID  . clopidogrel  75 mg Oral Daily  . enoxaparin (LOVENOX) injection  20 mg Subcutaneous Daily  . guaiFENesin  600 mg Oral BID  . insulin aspart  0-9 Units Subcutaneous Q4H  . ipratropium-albuterol  3 mL Nebulization Q6H  . lactose free nutrition  237 mL Oral TID BM  . metoprolol succinate  50 mg Oral Daily  . polyethylene glycol  17 g Oral Daily    Continuous Infusions:    LOS: 4 days     Kayleen Memos, MD [facility] Pager [phone]  If 7PM-7AM, please contact night-coverage [email or web address] Password [facility] 08/07/2017, 3:29 PM

## 2017-08-07 NOTE — Care Management Note (Addendum)
Case Management Note  Patient Details  Name: Katherine Cole MRN: [id number] Date of Birth: 08-13-1940  Subjective/Objective:                    Action/Plan:  Discussed home health with patient and daughter [name] at bedside.   Confirmed face sheet information . Patient's cell number is [phone]. [name]'s cell is [phone].  Patient has walker and shower chair at home already. Her bathroom is inside her bedroom at home , so she does not feel that she needs a 3 in 1.   Choice offered , patient and family members have used [facility] in the past. Patient would like [facility] again.  Referral given and accepted to [facility] with [facility].  Expected Discharge Date:                  Expected Discharge Plan:  [place]  In-House Referral:     Discharge planning Services  CM Consult  Post Acute Care Choice:  Home Health Choice offered to:  Adult Children, Patient  DME Arranged:  N/A DME Agency:  NA  HH Arranged:   PT HH Agency:  [facility]  Status of Service:  In process, will continue to follow  If discussed at Long Length of Stay Meetings, dates discussed:    Additional Comments:  Marilu Favre, RN 08/07/2017, 12:28 PM

## 2017-08-07 NOTE — Care Management Important Message (Signed)
Important Message  Patient Details  Name: Katherine Cole MRN: [id number] Date of Birth: 1940/11/01   Medicare Important Message Given:  Yes    Orbie Pyo 08/07/2017, 2:48 PM

## 2017-08-08 ENCOUNTER — Inpatient Hospital Stay (HOSPITAL_COMMUNITY): Payer: Medicare Other

## 2017-08-08 ENCOUNTER — Encounter (HOSPITAL_COMMUNITY): Payer: Self-pay | Admitting: Radiology

## 2017-08-08 ENCOUNTER — Telehealth: Payer: Self-pay | Admitting: *Deleted

## 2017-08-08 LAB — CULTURE, BLOOD (ROUTINE X 2)
Culture: NO GROWTH
Culture: NO GROWTH
SPECIAL REQUESTS: ADEQUATE
Special Requests: ADEQUATE

## 2017-08-08 LAB — TYPE AND SCREEN
ABO/RH(D): O POS
Antibody Screen: NEGATIVE
UNIT DIVISION: 0
Unit division: 0

## 2017-08-08 LAB — BPAM RBC
Blood Product Expiration Date: 201905052359
Blood Product Expiration Date: 201905152359
ISSUE DATE / TIME: 201904291133
UNIT TYPE AND RH: 5100
Unit Type and Rh: 5100

## 2017-08-08 LAB — GLUCOSE, CAPILLARY
GLUCOSE-CAPILLARY: 103 mg/dL — AB (ref 65–99)
GLUCOSE-CAPILLARY: 198 mg/dL — AB (ref 65–99)
Glucose-Capillary: 152 mg/dL — ABNORMAL HIGH (ref 65–99)
Glucose-Capillary: 123 mg/dL — ABNORMAL HIGH (ref 65–99)
Glucose-Capillary: 370 mg/dL — ABNORMAL HIGH (ref 65–99)
Glucose-Capillary: 327 mg/dL — ABNORMAL HIGH (ref 65–99)

## 2017-08-08 MED ORDER — IOHEXOL 300 MG/ML  SOLN
75.0000 mL | Freq: Once | INTRAMUSCULAR | Status: AC | PRN
  Administered 2017-08-08: 75 mL via INTRAVENOUS

## 2017-08-08 MED ORDER — IPRATROPIUM-ALBUTEROL 0.5-2.5 (3) MG/3ML IN SOLN
3.0000 mL | Freq: Three times a day (TID) | RESPIRATORY_TRACT | Status: DC
  Administered 2017-08-08 – 2017-08-09 (×6): 3 mL via RESPIRATORY_TRACT
  Filled 2017-08-08 (×7): qty 3

## 2017-08-08 MED ORDER — SODIUM CHLORIDE 0.9 % IV SOLN
INTRAVENOUS | Status: DC
Start: 2017-08-08 — End: 2017-08-10
  Administered 2017-08-08: 20:00:00 via INTRAVENOUS

## 2017-08-08 NOTE — Progress Notes (Addendum)
PROGRESS NOTE  Katherine Cole GGY:[id number] DOB: Mar 29, 1941 DOA: 08/02/2017 PCP: Prince Solian, MD  HPI/Recap of past 24 hours: Katherine Cole is a 77 y.o. 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  Patient had her first chemotherapy session on 4/16.  Since that time patient had been noted to have fairly good appetite.  However yesterday afternoon patient's blood glucose suddenly dropped to 62.  .  Patient was given food to keep her blood sugar up, but it kept dropping as low as 56.  Patient had associated symptoms of chills and weakness stating that felt as though her legs were shaky.  At that time patient had not had any fever.  Family was advised to bring the patient to the emergency department for further evaluation.  While waiting patient reports developing a dry cough that she thinks that she caught from someone in the waiting room.  Patient had just been hospitalized last month on 3/25 diagnosed with atypical pneumonia found to have left-sided mass that was related to be diagnosed squamous cell.  Denies having any significant rash or swelling around her port site.  08/04/2017: Patient seen and examined with her daughter at bedside.  She has no new complaints.  Hemoglobin noted to be 6.8.  Ordered 2 units of PRBC to be transfused.  After 1 unit of PRBC patient became febrile, hypertensive, and tachypneic.  Will obtain an H&H after 1 unit PRBC transfused.  If needed will give the other unit in the morning.  08/05/17: Patient seen and examined with her daughter at bedside.  She reports cough with difficulty bringing up mucus.  Denies any chest pain or dyspnea.  08/06/17: Patient seen and examined with her daughter at bedside.  She reports cough is improved.  Denies chest pain.  08/07/17: Seen and examined with her daughter at bedside.  Reports persistent cough.   Afebrile in the last 48 hours.  08/08/17: Pulmonology paged 3 times with no return for consult. Persistent cough. Denies chest pain.  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  Completed 5 days of IV vancomycin and IV Zosyn. Continue hypersaline nebs and Mucinex for cough Continue DuoNeb treatments every 6 hours and every 2 hours as needed Continue O2 supplementation to maintain saturation 92% or greater Afebrile in the last 48 hours.  Persistent/worsening cough' pulm paged x3 with no return  Will page again in the morning C/w current maangement Obtain CT chest w contrast to assess for any changes from prior CT chest done on March 2019 Spoke with Dr Julien Nordmann on the phone to give update about the patient. She has a f/u appointment on Monday 08/11/17.  Leukocytosis Suspect secondary to her cancer Contact Dr. Earlie Server for follow-up  Anemia of chronic disease post 1 unit PRBC Anemia stable No overt sign of bleeding  Hypertension Blood pressures well controlled Continue home meds  Hyperlipidemia  Hyperlipidemia stable Continue home meds  History of left upper lobe lung small cell carcinoma Patient will follow-up with oncology outpatient  Coronary artery disease Coronary artery disease  is stable Continue home meds  Moderate calorie protein malnutrition Abdomen 2.9 Increase calorie protein intake Continue oral supplement  Type 2 diabetes Diabetes, blood sugars are stable Continue insulin sliding scale   Code Status: Full code  Family Communication: Daughter at bedside  Disposition Plan: Home when clinically  stable   Consultants:  None  Procedures:  None  Antimicrobials: None  DVT prophylaxis: Subcu Lovenox   Objective: Vitals:   08/07/17 2056 08/08/17 0003 08/08/17 0404 08/08/17 1345  BP: (!) 149/76  (!) 149/73 (!) 156/78  Pulse: 64  (!) 54 76  Resp: 18  16 14   Temp: 99.5 F (37.5 C) 98.2 F (36.8 C) 98.2 F (36.8 C) 98.9 F (37.2 C)  TempSrc: Oral  Oral Oral  SpO2: 96%  96% 98%  Weight:      Height:        Intake/Output Summary (Last 24 hours) at 08/08/2017 1858 Last data filed at 08/08/2017 1147 Gross per 24 hour  Intake 220 ml  Output 2300 ml  Net -2080 ml   Filed Weights   08/04/17 1627  Weight: 36.3 kg (80 lb)    Exam:   General: 77 yo CF frail NAD A&O x 3  Cardiovascular:RRR no rubs or gallops. No JVD or thyromegaly present.  Respiratory: mild rales at bases  Abdomen: Soft nontender nondistended with normal bowel sounds x4 quadrants.  Musculoskeletal: No lower extremity edema.  Skin: No ulcerative lesions noted  Psychiatry: Mood is appropriate for condition and setting   Data Reviewed: CBC: Recent Labs  Lab 08/03/17 0140 08/03/17 1435 08/04/17 0711 08/04/17 1704 08/05/17 0342 08/06/17 1036 08/07/17 0432  WBC  --  5.9 7.0  --  11.2* 11.6* 12.8*  NEUTROABS 1.8 3.1  --   --   --   --   --   HGB  --  7.5* 6.8* 9.3* 8.9* 9.9* 9.0*  HCT  --  22.2* 20.4* 26.9*  27.3* 26.6* 29.5* 27.0*  MCV  --  87.1 87.2  --  85.3 86.8 86.8  PLT  --  62* 52*  --  54* 64* 71*   Basic Metabolic Panel: Recent Labs  Lab 08/02/17 2022 08/03/17 1435 08/04/17 0711 08/05/17 0342 08/07/17 0432  NA 135 133* 137 137 138  K 4.1 3.6 3.6 3.5 3.5  CL 100* 100* 104 104 105  CO2 27 23 23 24 26   GLUCOSE 109* 148* 119* 105* 124*  BUN 19 18 11 11 7   CREATININE 0.72 0.88 0.86 0.73 0.79  CALCIUM 8.7* 7.3* 7.1* 8.2* 8.3*   GFR: Estimated Creatinine Clearance: 35.4 mL/min (by C-G formula based on SCr of 0.79 mg/dL). Liver Function Tests: Recent Labs  Lab  08/03/17 0140  AST 27  ALT 24  ALKPHOS 86  BILITOT 0.4  PROT 6.2*  ALBUMIN 2.7*   No results for input(s): LIPASE, AMYLASE in the last 168 hours. No results for input(s): AMMONIA in the last 168 hours. Coagulation Profile: No results for input(s): INR, PROTIME in the last 168 hours. Cardiac Enzymes: No results for input(s): CKTOTAL, CKMB, CKMBINDEX, TROPONINI in the last 168 hours. BNP (last 3 results) No results for input(s): PROBNP in the last 8760 hours. HbA1C: No results for input(s): HGBA1C in the last 72 hours. CBG: Recent Labs  Lab 08/08/17 0108 08/08/17 0406 08/08/17 0827 08/08/17 1259 08/08/17 1610  GLUCAP 152* 103* 123* 198* 370*   Lipid Profile: No results for input(s): CHOL, HDL, LDLCALC, TRIG, CHOLHDL, LDLDIRECT in the last 72 hours.  Thyroid Function Tests: No results for input(s): TSH, T4TOTAL, FREET4, T3FREE, THYROIDAB in the last 72 hours. Anemia Panel: No results for input(s): VITAMINB12, FOLATE, FERRITIN, TIBC, IRON, RETICCTPCT in the last 72 hours. Urine analysis:    Component Value Date/Time   COLORURINE YELLOW 08/03/2017 0532   APPEARANCEUR CLEAR 08/03/2017 0532   LABSPEC 1.018 08/03/2017 0532   PHURINE 6.0 08/03/2017 0532   GLUCOSEU NEGATIVE 08/03/2017 0532   HGBUR NEGATIVE 08/03/2017 0532   BILIRUBINUR NEGATIVE 08/03/2017 0532   KETONESUR NEGATIVE 08/03/2017 0532   PROTEINUR 100 (A) 08/03/2017 0532   UROBILINOGEN 0.2 01/20/2014 1322   NITRITE NEGATIVE 08/03/2017 0532   LEUKOCYTESUR NEGATIVE 08/03/2017 0532   Sepsis Labs: @LABRCNTIP (procalcitonin:4,lacticidven:4)  ) Recent Results (from the past 240 hour(s))  Blood Culture (routine x 2)     Status: None   Collection Time: 08/03/17  1:40 AM  Result Value Ref Range Status   Specimen Description BLOOD RIGHT ARM  Final   Special Requests   Final    BOTTLES DRAWN AEROBIC AND ANAEROBIC Blood Culture adequate volume   Culture   Final    NO GROWTH 5 DAYS Performed at [facility], [address] [place], [place] [address]    Report Status 08/08/2017 FINAL  Final  Blood Culture (routine x 2)     Status: None   Collection Time: 08/03/17  1:41 AM  Result Value Ref Range Status   Specimen Description BLOOD LEFT ARM  Final   Special Requests   Final    BOTTLES DRAWN AEROBIC AND ANAEROBIC Blood Culture adequate volume   Culture   Final    NO GROWTH 5 DAYS Performed at [facility], [place] [address] [place], [place] [address]    Report Status 08/08/2017 FINAL  Final  Respiratory Panel by PCR     Status: Abnormal   Collection Time: 08/03/17  5:32 AM  Result Value Ref Range Status   Adenovirus NOT DETECTED NOT DETECTED Final   Coronavirus 229E NOT DETECTED NOT DETECTED Final   Coronavirus HKU1 NOT DETECTED NOT DETECTED Final   Coronavirus NL63 NOT DETECTED NOT DETECTED Final   Coronavirus OC43 NOT DETECTED NOT DETECTED Final   Metapneumovirus NOT DETECTED NOT DETECTED Final   Rhinovirus / Enterovirus DETECTED (A) NOT DETECTED Final   Influenza A NOT DETECTED NOT DETECTED Final   Influenza B NOT DETECTED NOT DETECTED Final   Parainfluenza Virus 1 NOT DETECTED NOT DETECTED Final   Parainfluenza Virus 2 NOT DETECTED NOT DETECTED Final   Parainfluenza Virus 3 NOT DETECTED NOT DETECTED Final   Parainfluenza Virus 4 NOT DETECTED NOT DETECTED Final   Respiratory Syncytial Virus NOT DETECTED NOT DETECTED Final   Bordetella pertussis NOT DETECTED NOT DETECTED Final   Chlamydophila pneumoniae NOT DETECTED NOT DETECTED Final   Mycoplasma pneumoniae NOT DETECTED NOT DETECTED Final    Comment: Performed at [facility], [place] [address], [place], [place] [address]  Culture, Urine     Status: None   Collection Time: 08/03/17  7:17 PM  Result Value Ref Range Status   Specimen Description URINE, RANDOM  Final   Special Requests NONE  Final   Culture   Final    NO GROWTH Performed at [facility], [place] [address], [place], [place] [address]    Report Status  08/04/2017 FINAL  Final  Culture, sputum-assessment     Status: None   Collection Time: 08/04/17  5:24 AM  Result Value Ref  Range Status   Specimen Description SPUTUM  Final   Special Requests NONE  Final   Sputum evaluation   Final    Sputum specimen not acceptable for testing.  Please recollect.   Gram Stain Report Called to,Read Back By and Verified With: RN Deneen Harts [id number] MLM Performed at [facility], [place] [address] [place], [place] [address]    Report Status 08/04/2017 FINAL  Final  Culture, expectorated sputum-assessment     Status: None   Collection Time: 08/04/17  1:30 PM  Result Value Ref Range Status   Specimen Description SPUTUM  Final   Special Requests NONE  Final   Sputum evaluation   Final    THIS SPECIMEN IS ACCEPTABLE FOR SPUTUM CULTURE Performed at [facility], [place] [address] [place], [place] [address]    Report Status 08/04/2017 FINAL  Final  Culture, respiratory (NON-Expectorated)     Status: None   Collection Time: 08/04/17  1:30 PM  Result Value Ref Range Status   Specimen Description SPUTUM  Final   Special Requests NONE Reflexed from [id number]  Final   Gram Stain   Final    FEW WBC PRESENT, PREDOMINANTLY PMN RARE GRAM POSITIVE COCCI RARE GRAM NEGATIVE RODS    Culture   Final    FEW Consistent with normal respiratory flora. Performed at [facility], [place] [address] [place], [place] [address]    Report Status 08/07/2017 FINAL  Final      Studies: No results found.  Scheduled Meds: . amLODipine  10 mg Oral Daily  . aspirin EC  81 mg Oral Daily  . atorvastatin  80 mg Oral Daily  . carbamide peroxide  5 drop Both EARS BID  . clopidogrel  75 mg Oral Daily  . enoxaparin (LOVENOX) injection  20 mg Subcutaneous Daily  . guaiFENesin  600 mg Oral BID  . insulin aspart  0-9 Units Subcutaneous Q4H  . ipratropium-albuterol  3 mL Nebulization TID  . lactose free nutrition  237 mL Oral TID BM  . metoprolol succinate  50 mg Oral  Daily  . polyethylene glycol  17 g Oral Daily    Continuous Infusions:    LOS: 5 days     Kayleen Memos, MD [facility] Pager [phone]  If 7PM-7AM, please contact night-coverage [email or web address] Password [facility] 08/08/2017, 6:58 PM

## 2017-08-08 NOTE — Telephone Encounter (Signed)
Oncology Nurse Navigator Documentation  Oncology Nurse Navigator Flowsheets 08/08/2017  Navigator Location [facility]  Navigator Encounter Type Telephone/I received a call from patient's daughter.  She had questions about Monday's appt. I clarified.  She was thankful for the call.   Telephone Outgoing Call  Patient Visit Type Inpatient  Treatment Phase Treatment  Barriers/Navigation Needs Education  Education Other  Interventions Education  Education Method Verbal  Acuity Level 1  Time Spent with Patient 30

## 2017-08-08 NOTE — Progress Notes (Signed)
Inpatient Diabetes Program Recommendations  AACE/ADA: New Consensus Statement on Inpatient Glycemic Control (2015)  Target Ranges:  Prepandial:   less than 140 mg/dL      Peak postprandial:   less than 180 mg/dL (1-2 hours)      Critically ill patients:  140 - 180 mg/dL   Results for NONI, STONESIFER (MRN [id number]) as of 08/08/2017 10:49  Ref. Range 08/07/2017 08:15 08/07/2017 11:53 08/07/2017 16:34 08/07/2017 21:04 08/08/2017 04:06 08/08/2017 08:27  Glucose-Capillary Latest Ref Range: 65 - 99 mg/dL 136 (H) 208 (H) 307 (H) 305 (H) 103 (H) 123 (H)   Review of Glycemic Control  Diabetes history: DM 2 Outpatient Diabetes medications: Lantus 15 units, Humalog 0-8 units tid, Metformin 1000 mg BID, Januvia 100 mg Daily Current orders for Inpatient glycemic control: Novolog Sensitive Correction 0-9 units Q 4 hours  Inpatient Diabetes Program Recommendations:    Elevated glucose yesterday as CBG check was after supplements had been given to patient. Will watch trends today as glucose levels so far are 103, 123 mg/dl.  Thanks,  Tama Headings RN, MSN, BC-ADM, [facility] Inpatient Diabetes Coordinator Team Pager [phone] (8a-5p)

## 2017-08-09 DIAGNOSIS — D61810 Antineoplastic chemotherapy induced pancytopenia: Secondary | ICD-10-CM

## 2017-08-09 DIAGNOSIS — J189 Pneumonia, unspecified organism: Secondary | ICD-10-CM

## 2017-08-09 DIAGNOSIS — T451X5A Adverse effect of antineoplastic and immunosuppressive drugs, initial encounter: Secondary | ICD-10-CM

## 2017-08-09 DIAGNOSIS — A419 Sepsis, unspecified organism: Secondary | ICD-10-CM

## 2017-08-09 DIAGNOSIS — C3492 Malignant neoplasm of unspecified part of left bronchus or lung: Secondary | ICD-10-CM

## 2017-08-09 LAB — GLUCOSE, CAPILLARY
GLUCOSE-CAPILLARY: 104 mg/dL — AB (ref 65–99)
GLUCOSE-CAPILLARY: 292 mg/dL — AB (ref 65–99)
GLUCOSE-CAPILLARY: 208 mg/dL — AB (ref 65–99)
Glucose-Capillary: 173 mg/dL — ABNORMAL HIGH (ref 65–99)
Glucose-Capillary: 175 mg/dL — ABNORMAL HIGH (ref 65–99)
Glucose-Capillary: 182 mg/dL — ABNORMAL HIGH (ref 65–99)
Glucose-Capillary: 220 mg/dL — ABNORMAL HIGH (ref 65–99)

## 2017-08-09 LAB — CBC
HCT: 26.4 % — ABNORMAL LOW (ref 36.0–46.0)
HEMOGLOBIN: 8.6 g/dL — AB (ref 12.0–15.0)
MCH: 28.8 pg (ref 26.0–34.0)
MCHC: 32.6 g/dL (ref 30.0–36.0)
MCV: 88.3 fL (ref 78.0–100.0)
PLATELETS: 159 10*3/uL (ref 150–400)
RBC: 2.99 MIL/uL — ABNORMAL LOW (ref 3.87–5.11)
RDW: 14.9 % (ref 11.5–15.5)
WBC: 16.6 10*3/uL — ABNORMAL HIGH (ref 4.0–10.5)

## 2017-08-09 LAB — BASIC METABOLIC PANEL
Anion gap: 9 (ref 5–15)
BUN: 9 mg/dL (ref 6–20)
CALCIUM: 8.4 mg/dL — AB (ref 8.9–10.3)
CHLORIDE: 102 mmol/L (ref 101–111)
CO2: 27 mmol/L (ref 22–32)
CREATININE: 0.71 mg/dL (ref 0.44–1.00)
Glucose, Bld: 174 mg/dL — ABNORMAL HIGH (ref 65–99)
Potassium: 3.6 mmol/L (ref 3.5–5.1)
SODIUM: 138 mmol/L (ref 135–145)

## 2017-08-09 MED ORDER — FLUCONAZOLE 150 MG PO TABS
150.0000 mg | ORAL_TABLET | Freq: Every day | ORAL | Status: DC
  Administered 2017-08-09 – 2017-08-10 (×2): 150 mg via ORAL
  Filled 2017-08-09 (×2): qty 1

## 2017-08-09 MED ORDER — FLUTICASONE PROPIONATE 50 MCG/ACT NA SUSP
1.0000 | Freq: Every day | NASAL | Status: DC
  Administered 2017-08-09 – 2017-08-10 (×2): 1 via NASAL
  Filled 2017-08-09: qty 16

## 2017-08-09 MED ORDER — SALINE SPRAY 0.65 % NA SOLN: 1.0000 | NASAL | 0 refills | Status: AC | PRN

## 2017-08-09 MED ORDER — GUAIFENESIN ER 600 MG PO TB12
1200.0000 mg | ORAL_TABLET | Freq: Two times a day (BID) | ORAL | Status: DC
  Administered 2017-08-09 – 2017-08-10 (×2): 1200 mg via ORAL
  Filled 2017-08-09 (×2): qty 2

## 2017-08-09 MED ORDER — GUAIFENESIN ER 600 MG PO TB12: 1200.0000 mg | ORAL_TABLET | Freq: Two times a day (BID) | ORAL | Status: AC | PRN

## 2017-08-09 MED ORDER — SALINE SPRAY 0.65 % NA SOLN
1.0000 | Freq: Two times a day (BID) | NASAL | Status: DC
  Filled 2017-08-09: qty 44

## 2017-08-09 MED ORDER — FLUTICASONE PROPIONATE 50 MCG/ACT NA SUSP: 1.0000 | Freq: Every day | NASAL | 2 refills | Status: DC

## 2017-08-09 MED ORDER — IPRATROPIUM-ALBUTEROL 0.5-2.5 (3) MG/3ML IN SOLN: 3.0000 mL | Freq: Four times a day (QID) | RESPIRATORY_TRACT | 3 refills | Status: AC | PRN

## 2017-08-09 NOTE — Progress Notes (Addendum)
PROGRESS NOTE  Katherine Cole PFX:[id number] DOB: 17-Apr-1940 DOA: 08/02/2017 PCP: Prince Solian, MD  HPI/Recap of past 24 hours: Katherine Cole is a 77 y.o. 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  Patient had her first chemotherapy session on 4/16.  Since that time patient had been noted to have fairly good appetite.  However yesterday afternoon patient's blood glucose suddenly dropped to 62.  .  Patient was given food to keep her blood sugar up, but it kept dropping as low as 56.  Patient had associated symptoms of chills and weakness stating that felt as though her legs were shaky.  At that time patient had not had any fever.  Family was advised to bring the patient to the emergency department for further evaluation.  While waiting patient reports developing a dry cough that she thinks that she caught from someone in the waiting room.  Patient had just been hospitalized last month on 3/25 diagnosed with atypical pneumonia found to have left-sided mass that was related to be diagnosed squamous cell.  Denies having any significant rash or swelling around her port site.  08/04/2017: Patient seen and examined with her daughter at bedside.  She has no new complaints.  Hemoglobin noted to be 6.8.  Ordered 2 units of PRBC to be transfused.  After 1 unit of PRBC patient became febrile, hypertensive, and tachypneic.  Will obtain an H&H after 1 unit PRBC transfused.  If needed will give the other unit in the morning.  08/05/17: Patient seen and examined with her daughter at bedside.  She reports cough with difficulty bringing up mucus.  Denies any chest pain or dyspnea.  08/06/17: Patient seen and examined with her daughter at bedside.  She reports cough is improved.  Denies chest pain.  08/07/17: Seen and examined with her daughter at bedside.  Reports persistent cough.   Afebrile in the last 48 hours.  08/08/17: Pulmonology paged 3 times with no return for consult. Persistent cough. Denies chest pain.  08/09/17: Continues to complain of cough.  Pulmonology will see today.  Highly appreciated.  Assessment/Plan: Principal Problem:   Sepsis due to pneumonia [facility]) Active Problems:   Type 2 diabetes mellitus with hypoglycemia without coma ([place])   Essential hypertension   Primary small cell carcinoma of left lung (H[facility])   Antineoplastic chemotherapy induced pancytopenia ([place])   Hyperlipemia   Viral pneumonia  Sepsis secondary to H CAP, resolved Completed 5 days of IV vancomycin and IV Zosyn.  Persistent cough [facility] consulted.  Highly appreciated Recommendations per pulmonology CT chest with contrast done on 08/08/2017 did not reveal any worsening of cancer lesions.  Leukocytosis, worsening Afebrile Suspect secondary to her cancer  Anemia of chronic disease, stable Post 1 unit PRBC transfused No overt sign of bleeding  Accelerated hypertension Continue home meds IV hydralazine as needed for systolic blood pressure greater than 329 or diastolic blood pressure currently 105  Hyperlipidemia stable Continue home meds  History of left upper lobe lung small cell carcinoma Patient will follow-up with oncology outpatient  Coronary artery disease, stable No chest pain Continue home meds  Moderate calorie protein malnutrition Albumin 2.9 Encouraged to increase calorie protein intake Continue oral supplement  Type II diabetes Diabetes, blood sugars are stable Continue insulin sliding scale  Vaginal yeast infection Started po fluconazole x 3 days   Code Status:  Full code  Family Communication: Daughter at bedside  Disposition Plan: Home when clinically stable   Consultants:  None  Procedures:  None  Antimicrobials: None  DVT prophylaxis: Subcu Lovenox   Objective: Vitals:   08/08/17 1345 08/08/17 2107  08/08/17 2125 08/09/17 0538  BP: (!) 156/78 (!) 154/55  (!) 145/61  Pulse: 76 65  62  Resp: 14 18  15   Temp: 98.9 F (37.2 C) 99.1 F (37.3 C)  98.9 F (37.2 C)  TempSrc: Oral Oral  Oral  SpO2: 98% 97% 96% 97%  Weight:      Height:        Intake/Output Summary (Last 24 hours) at 08/09/2017 1423 Last data filed at 08/09/2017 1336 Gross per 24 hour  Intake 1020 ml  Output 300 ml  Net 720 ml   Filed Weights   08/04/17 1627  Weight: 36.3 kg (80 lb)    Exam:   General: 60-year-old Caucasian female frail in no acute distress.  Alert oriented x3.  Cardiovascular: Regular rate and rhythm.  No gallops, no rubs.  No JVD or thyromegaly present.    Respiratory: mild rales at bases  Abdomen: Soft nontender nondistended with normal bowel sounds x4 quadrants.  Musculoskeletal: No lower extremity edema.  Skin: No ulcerative lesions noted  Psychiatry: Mood is appropriate for condition and setting   Data Reviewed: CBC: Recent Labs  Lab 08/03/17 0140 08/03/17 1435 08/04/17 0711 08/04/17 1704 08/05/17 0342 08/06/17 1036 08/07/17 0432 08/09/17 0458  WBC  --  5.9 7.0  --  11.2* 11.6* 12.8* 16.6*  NEUTROABS 1.8 3.1  --   --   --   --   --   --   HGB  --  7.5* 6.8* 9.3* 8.9* 9.9* 9.0* 8.6*  HCT  --  22.2* 20.4* 26.9*  27.3* 26.6* 29.5* 27.0* 26.4*  MCV  --  87.1 87.2  --  85.3 86.8 86.8 88.3  PLT  --  62* 52*  --  54* 64* 71* 488   Basic Metabolic Panel: Recent Labs  Lab 08/03/17 1435 08/04/17 0711 08/05/17 0342 08/07/17 0432 08/09/17 0458  NA 133* 137 137 138 138  K 3.6 3.6 3.5 3.5 3.6  CL 100* 104 104 105 102  CO2 23 23 24 26 27   GLUCOSE 148* 119* 105* 124* 174*  BUN 18 11 11 7 9   CREATININE 0.88 0.86 0.73 0.79 0.71  CALCIUM 7.3* 7.1* 8.2* 8.3* 8.4*   GFR: Estimated Creatinine Clearance: 35.4 mL/min (by C-G formula based on SCr of 0.71 mg/dL). Liver Function Tests: Recent Labs  Lab 08/03/17 0140  AST 27  ALT 24  ALKPHOS 86  BILITOT 0.4  PROT 6.2*    ALBUMIN 2.7*   No results for input(s): LIPASE, AMYLASE in the last 168 hours. No results for input(s): AMMONIA in the last 168 hours. Coagulation Profile: No results for input(s): INR, PROTIME in the last 168 hours. Cardiac Enzymes: No results for input(s): CKTOTAL, CKMB, CKMBINDEX, TROPONINI in the last 168 hours. BNP (last 3 results) No results for input(s): PROBNP in the last 8760 hours. HbA1C: No results for input(s): HGBA1C in the last 72 hours. CBG: Recent Labs  Lab 08/08/17 2005 08/09/17 0033 08/09/17 0540 08/09/17 0750 08/09/17 1149  GLUCAP 327* 182* 175* 104* 292*   Lipid Profile: No results for input(s): CHOL, HDL, LDLCALC, TRIG, CHOLHDL, LDLDIRECT in the last 72 hours. Thyroid Function Tests: No results for input(s): TSH, T4TOTAL, FREET4, T3FREE, THYROIDAB in the last 72 hours.  Anemia Panel: No results for input(s): VITAMINB12, FOLATE, FERRITIN, TIBC, IRON, RETICCTPCT in the last 72 hours. Urine analysis:    Component Value Date/Time   COLORURINE YELLOW 08/03/2017 0532   APPEARANCEUR CLEAR 08/03/2017 0532   LABSPEC 1.018 08/03/2017 0532   PHURINE 6.0 08/03/2017 0532   GLUCOSEU NEGATIVE 08/03/2017 0532   HGBUR NEGATIVE 08/03/2017 0532   BILIRUBINUR NEGATIVE 08/03/2017 0532   KETONESUR NEGATIVE 08/03/2017 0532   PROTEINUR 100 (A) 08/03/2017 0532   UROBILINOGEN 0.2 01/20/2014 1322   NITRITE NEGATIVE 08/03/2017 0532   LEUKOCYTESUR NEGATIVE 08/03/2017 0532   Sepsis Labs: @LABRCNTIP (procalcitonin:4,lacticidven:4)  ) Recent Results (from the past 240 hour(s))  Blood Culture (routine x 2)     Status: None   Collection Time: 08/03/17  1:40 AM  Result Value Ref Range Status   Specimen Description BLOOD RIGHT ARM  Final   Special Requests   Final    BOTTLES DRAWN AEROBIC AND ANAEROBIC Blood Culture adequate volume   Culture   Final    NO GROWTH 5 DAYS Performed at [facility], [address], [place], [place] [address]    Report Status 08/08/2017  FINAL  Final  Blood Culture (routine x 2)     Status: None   Collection Time: 08/03/17  1:41 AM  Result Value Ref Range Status   Specimen Description BLOOD LEFT ARM  Final   Special Requests   Final    BOTTLES DRAWN AEROBIC AND ANAEROBIC Blood Culture adequate volume   Culture   Final    NO GROWTH 5 DAYS Performed at [facility], [place] [address] [place], [place] [address]    Report Status 08/08/2017 FINAL  Final  Respiratory Panel by PCR     Status: Abnormal   Collection Time: 08/03/17  5:32 AM  Result Value Ref Range Status   Adenovirus NOT DETECTED NOT DETECTED Final   Coronavirus 229E NOT DETECTED NOT DETECTED Final   Coronavirus HKU1 NOT DETECTED NOT DETECTED Final   Coronavirus NL63 NOT DETECTED NOT DETECTED Final   Coronavirus OC43 NOT DETECTED NOT DETECTED Final   Metapneumovirus NOT DETECTED NOT DETECTED Final   Rhinovirus / Enterovirus DETECTED (A) NOT DETECTED Final   Influenza A NOT DETECTED NOT DETECTED Final   Influenza B NOT DETECTED NOT DETECTED Final   Parainfluenza Virus 1 NOT DETECTED NOT DETECTED Final   Parainfluenza Virus 2 NOT DETECTED NOT DETECTED Final   Parainfluenza Virus 3 NOT DETECTED NOT DETECTED Final   Parainfluenza Virus 4 NOT DETECTED NOT DETECTED Final   Respiratory Syncytial Virus NOT DETECTED NOT DETECTED Final   Bordetella pertussis NOT DETECTED NOT DETECTED Final   Chlamydophila pneumoniae NOT DETECTED NOT DETECTED Final   Mycoplasma pneumoniae NOT DETECTED NOT DETECTED Final    Comment: Performed at [facility], [place] [address] [place], [place] [address]  Culture, Urine     Status: None   Collection Time: 08/03/17  7:17 PM  Result Value Ref Range Status   Specimen Description URINE, RANDOM  Final   Special Requests NONE  Final   Culture   Final    NO GROWTH Performed at [facility], [place] [address], [place],  [address]    Report Status 08/04/2017 FINAL  Final  Culture, sputum-assessment     Status: None    Collection Time: 08/04/17  5:24 AM  Result Value Ref Range Status   Specimen Description SPUTUM  Final   Special Requests NONE  Final  Sputum evaluation   Final    Sputum specimen not acceptable for testing.  Please recollect.   Gram Stain Report Called to,Read Back By and Verified With: RN Deneen Harts [id number] Performed at [facility], [place] [address], [place], [place] [address]    Report Status 08/04/2017 FINAL  Final  Culture, expectorated sputum-assessment     Status: None   Collection Time: 08/04/17  1:30 PM  Result Value Ref Range Status   Specimen Description SPUTUM  Final   Special Requests NONE  Final   Sputum evaluation   Final    THIS SPECIMEN IS ACCEPTABLE FOR SPUTUM CULTURE Performed at [facility], [place] [address] [place], [place] [address]    Report Status 08/04/2017 FINAL  Final  Culture, respiratory (NON-Expectorated)     Status: None   Collection Time: 08/04/17  1:30 PM  Result Value Ref Range Status   Specimen Description SPUTUM  Final   Special Requests NONE Reflexed from [id number]  Final   Gram Stain   Final    FEW WBC PRESENT, PREDOMINANTLY PMN RARE GRAM POSITIVE COCCI RARE GRAM NEGATIVE RODS    Culture   Final    FEW Consistent with normal respiratory flora. Performed at [facility], [place] [address] [place], [place] [address]    Report Status 08/07/2017 FINAL  Final      Studies: Ct Chest W Contrast  Result Date: 08/08/2017 CLINICAL DATA:  Acute respiratory illness. Left upper lobe lung cancer, on chemotherapy. EXAM: CT CHEST WITH CONTRAST TECHNIQUE: Multidetector CT imaging of the chest was performed during intravenous contrast administration. CONTRAST:  72mL OMNIPAQUE IOHEXOL 300 MG/ML  SOLN COMPARISON:  Chest radiograph dated 08/07/2017. PET-CT dated 07/17/2017. FINDINGS: Cardiovascular: The heart is normal in size. No pericardial effusion. No evidence of thoracic aortic aneurysm. Atherosclerotic calcifications of the aortic arch.  Three vessel coronary atherosclerosis. Right chest port terminating at the cavoatrial junction. Mediastinum/Nodes: 12 mm short axis AP window node (series 3/image 58), previously 17 mm. 14 mm short axis left perihilar node (series 3/image 78), previously 19 mm when measured in a similar fashion. Visualized thyroid is unremarkable. Lungs/Pleura: 3.0 x 1.8 cm subpleural mass in the lateral left upper lobe (series 7/image 61), previously 4.2 x 2.2 cm, corresponding to the patient's known primary bronchogenic neoplasm. Surrounding mild ground-glass opacity. Mild patchy opacity in the left lower lobe (series 7/image 111), suspicious for pneumonia, less likely atelectasis. Additional bibasilar atelectasis. Small bilateral pleural effusions. Mild biapical pleural-parenchymal scarring. No pneumothorax. Upper Abdomen: Visualized upper abdomen is notable for a 10 mm metastasis in segment 4B (series 3/image 145), previously 14 mm. Musculoskeletal: Mild degenerative changes of the visualized thoracolumbar spine. IMPRESSION: 3.0 cm subpleural mass in the lateral left upper lobe, corresponding to the patient's known primary bronchogenic neoplasm, decreased. Improving mediastinal and left perihilar nodal metastases. 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 Mild patchy left lower lobe opacity, suspicious for pneumonia, less likely atelectasis. Bibasilar atelectasis with associated small bilateral pleural effusions. Aortic Atherosclerosis (ICD10-I70.0). Electronically Signed   By: Julian Hy M.D.   On: 08/08/2017 21:25    Scheduled Meds: . amLODipine  10 mg Oral Daily  . aspirin EC  81 mg Oral Daily  . atorvastatin  80 mg Oral Daily  . carbamide peroxide  5 drop Both EARS BID  . clopidogrel  75 mg Oral Daily  . enoxaparin (LOVENOX) injection  20 mg Subcutaneous Daily  . fluconazole  150 mg Oral Daily  .  fluticasone  1 spray Each Nare Daily  . guaiFENesin  1,200 mg Oral BID  . insulin aspart  0-9 Units  Subcutaneous Q4H  . ipratropium-albuterol  3 mL Nebulization TID  . lactose free nutrition  237 mL Oral TID BM  . metoprolol succinate  50 mg Oral Daily  . polyethylene glycol  17 g Oral Daily  . sodium chloride  1 spray Each Nare BID    Continuous Infusions: . sodium chloride 75 mL/hr at 08/08/17 2019     LOS: 6 days     Kayleen Memos, MD [facility] Pager [phone]  If 7PM-7AM, please contact night-coverage [email or web address] Password [email or web address] 08/09/2017, 2:23 PM

## 2017-08-09 NOTE — Progress Notes (Signed)
Pt.is Alert and oriented,slept well and had  uneventfull night

## 2017-08-09 NOTE — Consult Note (Signed)
Name: Katherine Cole MRN: [id number] DOB: [birth year]    ADMISSION DATE:  08/02/2017 CONSULTATION DATE:  08/02/17  REFERRING MD :  Dr. Nevada Crane / [facility]   CHIEF COMPLAINT:  Cough 3   HISTORY OF PRESENT ILLNESS:  77 y/o F, former smoker, with DM who presented to [facility] on 4/27 with hypoglycemia despite interventions from her PCP office.  In addition, she has had a cough which was reportedly unchanged from her baseline per ER notes.  She has extensive stage (T2a, N2, M1c) small cell lung cancer, followed by Dr. Julien Nordmann on systemic chemotherapy with carboplatin / etoposide / Neulasta support.  From chart review, it appears her last therapy was on 07/24/17.      Initial work up found her to be febrile to 102.9, WBC 2.8, hgb 9.1.  She was admitted per [facility] and treated empirically for infection with vancomycin + zosyn.  Blood, sputum, and urine cultures are negative.  RVP was positive for rhinovirus.    PCCM consulted for evaluation of cough.   Pt reports on admit she had a "runny nose that was like a faucet".  She feels this has improved but continues to have difficulty with secretion clearance.  Moist cough, sputum that she clears is thick.  Asking to go home.   Patient and family have multiple concerns for difficult to control blood sugars > low to high variability.   PAST MEDICAL HISTORY :   has a past medical history of Arthritis, CAD (coronary artery disease), Colon polyps, Depression, Diabetes mellitus, type 2 ([place]), Hyperlipemia, Hypertension, Osteoarthritis, Pneumonia (06/2017), Skin cancer (2012), and Stroke [facility]).   has a past surgical history that includes Coronary angioplasty with stent (1999; 2004; 03/11/2012); Appendectomy (~ 1965); Tonsillectomy and adenoidectomy (~ 1962); Femur fracture surgery (2011); Tubal ligation (1977); Skin cancer excision (2013); percutaneous coronary stent intervention (pci-s) (N/A, 03/11/2012); Cataract extraction; Video bronchoscopy with endobronchial ultrasound (N/A,  07/04/2017); IR US Guide Vasc Access Right (07/18/2017); and IR FLUORO GUIDE PORT INSERTION RIGHT (07/18/2017).  Prior to Admission medications   Medication Sig Start Date End Date Taking? Authorizing Provider  ALPRAZolam Duanne [place]) 0.5 MG tablet Take 1 tablet (0.5 mg total) by mouth at bedtime as needed for anxiety or sleep. 07/08/17 07/08/18 Yes Emokpae, Courage, MD  amLODipine (NORVASC) 5 MG tablet Take 1 tablet (5 mg total) by mouth daily. 07/09/17  Yes Emokpae, Courage, MD  aspirin 81 MG tablet Take 1 tablet (81 mg total) by mouth daily. 01/21/14  Yes Annita Brod, MD  atorvastatin (LIPITOR) 80 MG tablet Take 80 mg by mouth daily.   Yes [provider]  clopidogrel (PLAVIX) 75 MG tablet Take 75 mg by mouth daily.   Yes [provider]  HUMALOG KWIKPEN 100 UNIT/ML KiwkPen 0-8 units three times daily after meals 07/23/17  Yes [provider]  insulin glargine (LANTUS) 100 UNIT/ML injection Inject 0.08 mLs (8 Units total) into the skin at bedtime. Patient taking differently: Inject 15 Units into the skin daily.  07/08/17  Yes Emokpae, Courage, MD  JANUVIA 100 MG tablet Take 100 mg by mouth daily. 11/03/13  Yes [provider]  lidocaine-prilocaine (EMLA) cream Apply 1 application topically as needed. Patient taking differently: Apply 1 application topically daily as needed (pain).  07/11/17  Yes Curt Bears, MD  metFORMIN (GLUCOPHAGE) 1000 MG tablet Take 1,000 mg by mouth 2 (two) times daily with a meal.   Yes [provider]  metoprolol succinate (TOPROL-XL) 50 MG 24 hr tablet Take  50 mg by mouth daily. Take with or immediately following a meal.   Yes [provider]  nitroGLYCERIN (NITROSTAT) 0.4 MG SL tablet Place 1 tablet (0.4 mg total) under the tongue every 5 (five) minutes as needed (up to 3 doses). For chest pain 03/12/12  Yes Dunn, Dayna N, PA-C  ondansetron (ZOFRAN ODT) 4 MG disintegrating tablet Take 1 tablet (4 mg total) by mouth every 8  (eight) hours as needed for nausea or vomiting. 07/08/17  Yes Emokpae, Courage, MD  ondansetron (ZOFRAN) 4 MG tablet Take 1 tablet (4 mg total) by mouth every 8 (eight) hours as needed for nausea or vomiting. 09/10/14  Yes Melony Overly, MD  prochlorperazine (COMPAZINE) 10 MG tablet Take 1 tablet (10 mg total) by mouth every 6 (six) hours as needed for nausea or vomiting. 07/22/17  Yes Curt Bears, MD    Allergies  Allergen Reactions  . Codeine Other (See Comments)    "gives me a migraine" (03/11/2012)  . Morphine And Related Other (See Comments)    "gives me a migraine" (03/11/2012)    FAMILY HISTORY:  family history includes Brain cancer in her father; Colon cancer (age of onset: 58) in her brother; Diabetes in her daughter; Liver cancer in her mother; Lung cancer in her brother and sister; Stomach cancer in her brother and father.  SOCIAL HISTORY:  reports that she has been smoking cigarettes.  She has a 51.00 pack-year smoking history. She has never used smokeless tobacco. She reports that she does not drink alcohol or use drugs.  REVIEW OF SYSTEMS:  POSITIVES IN BOLD Constitutional: Negative for fever, chills, weight loss, malaise/fatigue and diaphoresis.  HENT: Negative for hearing loss, ear pain, nosebleeds, congestion, sore throat, neck pain, tinnitus and ear discharge.   Eyes: Negative for blurred vision, double vision, photophobia, pain, discharge and redness.  Respiratory: Negative for cough, hemoptysis, sputum production, shortness of breath, wheezing and stridor.   Cardiovascular: Negative for chest pain, palpitations, orthopnea, claudication, leg swelling and PND.  Gastrointestinal: Negative for heartburn, nausea, vomiting, abdominal pain, diarrhea, constipation, blood in stool and melena.  Genitourinary: Negative for dysuria, urgency, frequency, hematuria and flank pain.  Musculoskeletal: Negative for myalgias, back pain, joint pain and falls.  Skin: Negative for itching and  rash.  Neurological: Negative for dizziness, tingling, tremors, sensory change, speech change, focal weakness, seizures, loss of consciousness, weakness and headaches.  Endo/Heme/Allergies: Negative for environmental allergies and polydipsia. Does not bruise/bleed easily.   SUBJECTIVE: Tmax 98.9.  WBC increased to 16.6.    VITAL SIGNS: Temp:  [98.9 F (37.2 C)-99.1 F (37.3 C)] 98.9 F (37.2 C) (05/04 0538) Pulse Rate:  [62-76] 62 (05/04 0538) Resp:  [14-18] 15 (05/04 0538) BP: (145-156)/(55-78) 145/61 (05/04 0538) SpO2:  [96 %-98 %] 97 % (05/04 0538)  PHYSICAL EXAMINATION: General: cachectic female in NAD, family at bedside HEENT: MM pink/moist, no jvd, temporal wasting  Neuro: AAOx4, speech clear, MAE  CV: s1s2 rrr, no m/r/g PULM: even/non-labored, clear on R, basilar crackles on left  HY:IFOY, non-tender, bsx4 active  Extremities: warm/dry, no edema, small abrasion on LLE with bruising   Skin: no rashes or lesions  Recent Labs  Lab 08/05/17 0342 08/07/17 0432 08/09/17 0458  NA 137 138 138  K 3.5 3.5 3.6  CL 104 105 102  CO2 24 26 27   BUN 11 7 9   CREATININE 0.73 0.79 0.71  GLUCOSE 105* 124* 174*    Recent Labs  Lab 08/06/17 1036 08/07/17 0432 08/09/17  0458  HGB 9.9* 9.0* 8.6*  HCT 29.5* 27.0* 26.4*  WBC 11.6* 12.8* 16.6*  PLT 64* 71* 159    Ct Chest W Contrast  Result Date: 08/08/2017 CLINICAL DATA:  Acute respiratory illness. Left upper lobe lung cancer, on chemotherapy. EXAM: CT CHEST WITH CONTRAST TECHNIQUE: Multidetector CT imaging of the chest was performed during intravenous contrast administration. CONTRAST:  75mL OMNIPAQUE IOHEXOL 300 MG/ML  SOLN COMPARISON:  Chest radiograph dated 08/07/2017. PET-CT dated 07/17/2017. FINDINGS: Cardiovascular: The heart is normal in size. No pericardial effusion. No evidence of thoracic aortic aneurysm. Atherosclerotic calcifications of the aortic arch. Three vessel coronary atherosclerosis. Right chest port terminating  at the cavoatrial junction. Mediastinum/Nodes: 12 mm short axis AP window node (series 3/image 58), previously 17 mm. 14 mm short axis left perihilar node (series 3/image 78), previously 19 mm when measured in a similar fashion. Visualized thyroid is unremarkable. Lungs/Pleura: 3.0 x 1.8 cm subpleural mass in the lateral left upper lobe (series 7/image 61), previously 4.2 x 2.2 cm, corresponding to the patient's known primary bronchogenic neoplasm. Surrounding mild ground-glass opacity. Mild patchy opacity in the left lower lobe (series 7/image 111), suspicious for pneumonia, less likely atelectasis. Additional bibasilar atelectasis. Small bilateral pleural effusions. Mild biapical pleural-parenchymal scarring. No pneumothorax. Upper Abdomen: Visualized upper abdomen is notable for a 10 mm metastasis in segment 4B (series 3/image 145), previously 14 mm. Musculoskeletal: Mild degenerative changes of the visualized thoracolumbar spine. IMPRESSION: 3.0 cm subpleural mass in the lateral left upper lobe, corresponding to the patient's known primary bronchogenic neoplasm, decreased. Improving mediastinal and left perihilar nodal metastases. 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 Mild patchy left lower lobe opacity, suspicious for pneumonia, less likely atelectasis. Bibasilar atelectasis with associated small bilateral pleural effusions. Aortic Atherosclerosis (ICD10-I70.0). Electronically Signed   By: Julian Hy M.D.   On: 08/08/2017 21:25      SIGNIFICANT EVENTS  4/27  Admit with hypoglycemia, dry cough, fever to 102.9  STUDIES CT Chest 5/4 >> 3 cm subpleural mass in the lateral LUL (known) is decreased, improved mediastinal / left perihilar nodal metastases, improving hepatic metastasis, mild patchy LLL opacity suspicious for PNA, bibasilar atx with bilateral small pleural effusions  CULTURES RVP 4/28 >> positive for rhinovirus  Sputum 4/29 >> negative  UC 4/29 >> negative  BCx2 4/29 >>  negative   ANTIBIOTICS  Zosyn 4/28 >> 5/2  Vanco 4/28 >> 5/2    ASSESSMENT / PLAN:  Discussion:  77 y/o F, former smoker (1ppd for ~60 years), with metastatic small cell lung cancer admitted with hypoglycemia.  Work up concerning for possible LLL PNA.  Culture negative. Treated for 5 days with antibiotics.  Pt has a chronic cough but feels it was worse on admit.  Suspect this is related to her underlying lung cancer, rhinovirus positive and resolving questionable PNA vs atelectasis.  CT imaging shows improvement in underlying malignancy.  No hx of GERD / reflux symptoms.  She has had PND in the setting of rhinovirus which could further exacerbate coughing.  Surprisingly, she does not have dyspnea.    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 Julien Nordmann at discharge    Hyper/Hypoglycemia   Plan: Consider reduction of long acting insulin at discharge > defer to primary Will ask DM coordinator to see > if patient not discharged   Noe Gens, NP-C [place] Pgr: [phone] or if no answer [phone] 08/09/2017, 10:55 AM

## 2017-08-10 LAB — GLUCOSE, CAPILLARY
GLUCOSE-CAPILLARY: 118 mg/dL — AB (ref 65–99)
GLUCOSE-CAPILLARY: 144 mg/dL — AB (ref 65–99)
GLUCOSE-CAPILLARY: 180 mg/dL — AB (ref 65–99)

## 2017-08-10 MED ORDER — SITAGLIPTIN PHOSPHATE 100 MG PO TABS: 50.0000 mg | ORAL_TABLET | Freq: Every day | ORAL | 0 refills | Status: AC

## 2017-08-10 MED ORDER — BOOST PLUS PO LIQD: 237.0000 mL | Freq: Three times a day (TID) | ORAL | 0 refills | Status: AC

## 2017-08-10 MED ORDER — IPRATROPIUM-ALBUTEROL 0.5-2.5 (3) MG/3ML IN SOLN
3.0000 mL | Freq: Four times a day (QID) | RESPIRATORY_TRACT | Status: DC | PRN
  Administered 2017-08-10: 3 mL via RESPIRATORY_TRACT
  Filled 2017-08-10: qty 3

## 2017-08-10 MED ORDER — HEPARIN SOD (PORK) LOCK FLUSH 100 UNIT/ML IV SOLN
500.0000 [IU] | INTRAVENOUS | Status: AC | PRN
  Administered 2017-08-10: 500 [IU]

## 2017-08-10 MED ORDER — AMLODIPINE BESYLATE 10 MG PO TABS: 10.0000 mg | ORAL_TABLET | Freq: Every day | ORAL | 0 refills | Status: AC

## 2017-08-10 NOTE — Discharge Summary (Signed)
Discharge Summary  Katherine Cole [facility]:[id number] DOB: May 07, 1940  PCP: Prince Solian, MD  Admit date: 08/02/2017 Discharge date: 08/10/2017  Time spent: 25 minutes  Recommendations for Outpatient Follow-up:  1. Follow-up with oncology 2. Follow-up with PCP 3. Follow-up with pulmonology 4. Take your medications as prescribed 5. Fall precautions 6. Increase p.o. protein calorie intake  Discharge Diagnoses:  Active Hospital Problems   Diagnosis Date Noted  . Sepsis due to pneumonia ([place]) 08/03/2017  . Antineoplastic chemotherapy induced pancytopenia ([place]) 08/03/2017  . Hyperlipemia 08/03/2017  . Viral pneumonia 08/03/2017  . Primary small cell carcinoma of left lung ([place]) 07/12/2017  . Essential hypertension 02/10/2012  . Type 2 diabetes mellitus with hypoglycemia without coma ([place]) 08/01/2007    Resolved Hospital Problems  No resolved problems to display.    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 Patient had her first chemotherapy session on 4/16. Since that time patient had been noted to have fairly good appetite. However yesterday afternoon patient's blood glucose suddenly dropped to 62. . Patient was given food to keep her blood sugar up,but it kept dropping as low as 56. Patient had associated symptoms of chills and weakness stating that felt as though her legs were shaky. At that time patient had not had any fever. Family was advised to bring the patient to the emergency department for further evaluation. While waiting patient reports developing a  dry cough that she thinks that she caught from someone in the waiting room. Patient had just been hospitalized last month on 3/25 diagnosed with atypical pneumonia found to have left-sided mass that was related to be diagnosed squamous cell. Denies having any significant rash or swelling around her port site.  08/04/2017: Patient seen and examined with her daughter at bedside.  She has no new complaints.  Hemoglobin noted to be 6.8.  Ordered 2 units of PRBC to be transfused.  After 1 unit of PRBC patient became febrile, hypertensive, and tachypneic.  Will obtain an H&H after 1 unit PRBC transfused.  If needed will give the other unit in the morning.  08/05/17: Patient seen and examined with her daughter at bedside.  She reports cough with difficulty bringing up mucus.  Denies any chest pain or dyspnea.  08/06/17: Patient seen and examined with her daughter at bedside.  She reports cough is improved.  Denies chest pain.  08/07/17: Seen and examined with her daughter at bedside.  Reports persistent cough.  Afebrile in the last 48 hours.  08/08/17: Pulmonology paged 3 times with no return for consult. Persistent cough. Denies chest pain.  08/09/17: Continues to complain of cough.  Pulmonology will see today.  Highly appreciated.  519: Patient seen and examined with daughter at her bedside.  She has no new complaints.  The day of discharge patient was hemodynamically stable.  She will need to follow-up with her oncologist, PCP and pulmonology.  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  Persistent cough,  improving Multifactorial Pulmonary consulted.  Highly appreciated Recommendations per pulmonology CT chest with contrast done on 08/08/2017 did not reveal any worsening of cancer lesions.  COPD Follow-up with pulmonology outpatient May require PFT evaluation Continue duo nebs as needed Nebulizer DME ordered at discharge  Leukocytosis, worsening Afebrile Suspect secondary to her cancer  Anemia of chronic disease, stable Post 1 unit PRBC transfused No overt sign of bleeding Hemoglobin stable Repeat CBC in 1 week  Accelerated hypertension Continue home meds Follow-up with PCP outpatient  Hyperlipidemia stable Continue home meds  History of left upper lobe lung small cell carcinoma Patient will follow-up with oncology outpatient  Coronary artery disease, stable No chest pain Continue home meds  Moderate calorie protein malnutrition Albumin 2.9 Encouraged to increase calorie protein intake Continue oral supplement  Type II diabetes Diabetes, blood sugars are stable Continue insulin sliding scale  Vaginal yeast infection Started po fluconazole x 3 days     Procedures:  None  Consultations:  Pulmonology  Discharge Exam: BP (!) 171/66 (BP Location: Right Arm)   Pulse 62   Temp 99 F (37.2 C) (Oral)   Resp 15   Ht 5\' 1"  (1.549 m) Comment: 07/29/17  Wt 36.3 kg (80 lb) Comment: 07/29/17  SpO2 98%   BMI 15.12 kg/m    . General: 77 y.o. year-old female frail.  Alert and oriented x3. . Cardiovascular: Regular rate and rhythm with no rubs or gallops.  No thyromegaly or JVD noted.   Marland Kitchen Respiratory: Clear to auscultation with no wheezes or rales. Good inspiratory effort. . Abdomen: Soft nontender nondistended with normal bowel sounds x4 quadrants. . Musculoskeletal: No lower extremity edema. 2/4 pulses in all 4 extremities. . Skin: No ulcerative lesions noted or rashes, . Psychiatry: Mood is appropriate for condition and setting  Discharge  Instructions You were cared for by a hospitalist during your hospital stay. If you have any questions about your discharge medications or the care you received while you were in the hospital after you are discharged, you can call the unit and asked to speak with the hospitalist on call if the hospitalist that took care of you is not available. Once you are discharged, your primary care physician will handle any further medical issues. Please note that NO REFILLS for any discharge medications will be authorized once you are discharged, as it is imperative that you return to your primary care physician (or establish a relationship with a primary care physician if you do not have one) for your aftercare needs so that they can reassess your need for medications and monitor your lab values.  Discharge Instructions    DME Nebulizer machine   Complete by:  As directed    Patient needs a nebulizer to treat with the following condition:  Chronic obstructive pulmonary disease (COPD) ([place])     Allergies as of 08/10/2017      Reactions   Codeine Other (See Comments)   "gives me a migraine" (03/11/2012)   Morphine And Related Other (See Comments)   "gives me a migraine" (03/11/2012)      Medication List    STOP taking these medications   lidocaine-prilocaine cream Commonly known as:  EMLA   ondansetron 4  MG tablet Commonly known as:  ZOFRAN     TAKE these medications   ALPRAZolam 0.5 MG tablet Commonly known as:  XANAX Take 1 tablet (0.5 mg total) by mouth at bedtime as needed for anxiety or sleep.   amLODipine 10 MG tablet Commonly known as:  NORVASC Take 1 tablet (10 mg total) by mouth daily. Start taking on:  08/11/2017 What changed:    medication strength  how much to take   aspirin 81 MG tablet Take 1 tablet (81 mg total) by mouth daily.   atorvastatin 80 MG tablet Commonly known as:  LIPITOR Take 80 mg by mouth daily.   clopidogrel 75 MG tablet Commonly known as:  PLAVIX Take 75  mg by mouth daily.   fluticasone 50 MCG/ACT nasal spray Commonly known as:  FLONASE Place 1 spray into both nostrils daily.   guaiFENesin 600 MG 12 hr tablet Commonly known as:  MUCINEX Take 2 tablets (1,200 mg total) by mouth 2 (two) times daily as needed (Use for one week post discharge, then twice daily as needed).   HUMALOG KWIKPEN 100 UNIT/ML KiwkPen Generic drug:  insulin lispro 0-8 units three times daily after meals   insulin glargine 100 UNIT/ML injection Commonly known as:  LANTUS Inject 0.08 mLs (8 Units total) into the skin at bedtime. What changed:    how much to take  when to take this   ipratropium-albuterol 0.5-2.5 (3) MG/3ML Soln Commonly known as:  DUONEB Take 3 mLs by nebulization every 6 (six) hours as needed (Shortness of breath or wheezing).   lactose free nutrition Liqd Take 237 mLs by mouth 3 (three) times daily between meals.   metFORMIN 1000 MG tablet Commonly known as:  GLUCOPHAGE Take 1,000 mg by mouth 2 (two) times daily with a meal.   metoprolol succinate 50 MG 24 hr tablet Commonly known as:  TOPROL-XL Take 50 mg by mouth daily. Take with or immediately following a meal.   nitroGLYCERIN 0.4 MG SL tablet Commonly known as:  NITROSTAT Place 1 tablet (0.4 mg total) under the tongue every 5 (five) minutes as needed (up to 3 doses). For chest pain   ondansetron 4 MG disintegrating tablet Commonly known as:  ZOFRAN ODT Take 1 tablet (4 mg total) by mouth every 8 (eight) hours as needed for nausea or vomiting.   prochlorperazine 10 MG tablet Commonly known as:  COMPAZINE Take 1 tablet (10 mg total) by mouth every 6 (six) hours as needed for nausea or vomiting.   sitaGLIPtin 100 MG tablet Commonly known as:  JANUVIA Take 0.5 tablets (50 mg total) by mouth daily. What changed:  how much to take   sodium chloride 0.65 % Soln nasal spray Commonly known as:  OCEAN Place 1 spray into both nostrils as needed for congestion (use twice daily for  one week post discharge, then as needed).            Durable Medical Equipment  (From admission, onward)        Start     Ordered   08/10/17 0000  DME Nebulizer machine    Question:  Patient needs a nebulizer to treat with the following condition  Answer:  Chronic obstructive pulmonary disease (COPD) ([place])   08/10/17 1049   08/09/17 1216  For home use only DME Nebulizer machine  Once    Question:  Patient needs a nebulizer to treat with the following condition  Answer:  COPD (chronic obstructive pulmonary disease) ([place])  08/09/17 1215   08/09/17 1216  For home use only DME Nebulizer/meds  Once    Question:  Patient needs a nebulizer to treat with the following condition  Answer:  COPD (chronic obstructive pulmonary disease) ([place])   08/09/17 1215     Allergies  Allergen Reactions  . Codeine Other (See Comments)    "gives me a migraine" (03/11/2012)  . Morphine And Related Other (See Comments)    "gives me a migraine" (03/11/2012)   Follow-up Information    [facility]. Schedule an appointment as soon as possible for a visit on 08/09/2017.   Specialty:  Pulmonology Why:  Call to make an appointment with Dr. Lake Bells or one of our Nurse Practitioners ([name] or Sarah) to be seen within one to two weeks of discharge Contact information: [place]       Prince Solian, MD Follow up in 2 day(s).   Specialty:  Internal Medicine Why:  please call office for appointment. Contact information: [place] [address] [phone]        Curt Bears, MD Follow up in 1 day(s).   Specialty:  Oncology Why:  please keep appointment on Monday 08/11/17. Contact information: [place] [address] [phone]            The results of significant diagnostics from this hospitalization (including imaging, microbiology, ancillary and laboratory) are listed below for  reference.    Significant Diagnostic Studies: Dg Chest 2 View  Result Date: 08/02/2017 CLINICAL DATA:  Cough.  Low blood sugar for 2 days. EXAM: CHEST - 2 VIEW COMPARISON:  06/30/2017.  PET-CT, 07/17/2017. FINDINGS: Cardiac silhouette is normal in size. No mediastinal masses. Prominent left hilum consistent with adenopathy as noted on the prior PET-CT. Peripheral left upper lobe pleural based opacity is less apparent on the prior chest radiograph. Lungs are hyperexpanded. There is no evidence of pneumonia or pulmonary edema. No pleural effusion or pneumothorax. Right anterior chest wall Port-A-Cath is new from the prior exam. Tip projects in the lower superior vena cava. Skeletal structures are demineralized but grossly intact. IMPRESSION: 1. No acute cardiopulmonary disease. 2. Left upper lobe pleural-based lung mass consistent with carcinoma is less apparent than on the prior study. Prominent left hilum is consistent with metastatic adenopathy. Electronically Signed   By: Lajean Manes M.D.   On: 08/02/2017 20:49   Ct Chest W Contrast  Result Date: 08/08/2017 CLINICAL DATA:  Acute respiratory illness. Left upper lobe lung cancer, on chemotherapy. EXAM: CT CHEST WITH CONTRAST TECHNIQUE: Multidetector CT imaging of the chest was performed during intravenous contrast administration. CONTRAST:  109mL OMNIPAQUE IOHEXOL 300 MG/ML  SOLN COMPARISON:  Chest radiograph dated 08/07/2017. PET-CT dated 07/17/2017. FINDINGS: Cardiovascular: The heart is normal in size. No pericardial effusion. No evidence of thoracic aortic aneurysm. Atherosclerotic calcifications of the aortic arch. Three vessel coronary atherosclerosis. Right chest port terminating at the cavoatrial junction. Mediastinum/Nodes: 12 mm short axis AP window node (series 3/image 58), previously 17 mm. 14 mm short axis left perihilar node (series 3/image 78), previously 19 mm when measured in a similar fashion. Visualized thyroid is unremarkable.  Lungs/Pleura: 3.0 x 1.8 cm subpleural mass in the lateral left upper lobe (series 7/image 61), previously 4.2 x 2.2 cm, corresponding to the patient's known primary bronchogenic neoplasm. Surrounding mild ground-glass opacity. Mild patchy opacity in the left lower lobe (series 7/image 111), suspicious for pneumonia, less likely atelectasis. Additional bibasilar atelectasis.  Small bilateral pleural effusions. Mild biapical pleural-parenchymal scarring. No pneumothorax. Upper Abdomen: Visualized upper abdomen is notable for a 10 mm metastasis in segment 4B (series 3/image 145), previously 14 mm. Musculoskeletal: Mild degenerative changes of the visualized thoracolumbar spine. IMPRESSION: 3.0 cm subpleural mass in the lateral left upper lobe, corresponding to the patient's known primary bronchogenic neoplasm, decreased. Improving mediastinal and left perihilar nodal metastases. 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 Mild patchy left lower lobe opacity, suspicious for pneumonia, less likely atelectasis. Bibasilar atelectasis with associated small bilateral pleural effusions. Aortic Atherosclerosis (ICD10-I70.0). Electronically Signed   By: Julian Hy M.D.   On: 08/08/2017 21:25   Mr Jeri Cos YQ Contrast  Result Date: 07/15/2017 CLINICAL DATA:  Small cell lung cancer. Tremors. Difficulty walking and dizziness. Staging. EXAM: MRI HEAD WITHOUT AND WITH CONTRAST TECHNIQUE: Multiplanar, multiecho pulse sequences of the brain and surrounding structures were obtained without and with intravenous contrast. CONTRAST:  69mL MULTIHANCE GADOBENATE DIMEGLUMINE 529 MG/ML IV SOLN COMPARISON:  None. FINDINGS: Brain: Diffusion imaging does not show any acute or subacute infarction. There chronic small-vessel ischemic changes of the pons. No focal cerebellar insult. Cerebral hemispheres show old small vessel infarctions of the thalami and basal ganglia and mild chronic small-vessel change of the hemispheric white matter. No  cortical or large vessel territory infarction. No evidence of mass lesion, hemorrhage, hydrocephalus or extra-axial collection. No abnormal enhancement occurs. Vascular: Major vessels at the base of the brain show flow. Skull and upper cervical spine: Negative Sinuses/Orbits: Clear/normal Other: None IMPRESSION: No evidence of metastatic disease. Chronic small-vessel ischemic changes throughout the brain as outlined above. Electronically Signed   By: Nelson Chimes M.D.   On: 07/15/2017 09:28   Nm Pet Image Initial (pi) Skull Base To Thigh  Result Date: 07/17/2017 CLINICAL DATA:  Initial treatment strategy for pulmonary nodule. EXAM: NUCLEAR MEDICINE PET SKULL BASE TO THIGH TECHNIQUE: 5.8 mCi F-18 FDG was injected intravenously. Full-ring PET imaging was performed from the skull base to thigh after the radiotracer. CT data was obtained and used for attenuation correction and anatomic localization. Fasting blood glucose: 73 mg/dl COMPARISON:  CT chest 06/30/2017 FINDINGS: Mediastinal blood pool activity: SUV max 1.6 NECK: No hypermetabolic lymph nodes in the neck. Incidental CT findings: None CHEST: Enlarged and hypermetabolic left-sided mediastinal and hilar lymph nodes. Index left AP window lymph node measures 1.7 cm and has an SUV max equal to 11.48. Left hilar nodal mass measures 4.4 cm and has an SUV max equal to 11.18. Within the periphery of the left upper lobe there is a subpleural mass measuring 4.2 cm within SUV max equal to 10.8. Incidental CT findings: Aortic atherosclerosis. Calcifications within the LAD, left circumflex and RCA coronary arteries. ABDOMEN/PELVIS: 1.4 cm lesion within segment 4 of the liver measures 1.4 cm and has an SUV max equal to 5.0. No additional hypermetabolic liver lesions. No abnormal uptake within the adrenal glands, pancreas or spleen. Incidental CT findings: Aortic atherosclerosis. Infrarenal abdominal aortic ectasia measures 2.8 cm, image 159/3. Nonobstructing left renal  calculus measures 7 mm. SKELETON: Hypermetabolic lesions within the central sacrum has an SUV max equal to 5.79. A second hypermetabolic lesion within the posterior column of the right acetabulum has an SUV max equal to 5.2. Incidental CT findings: No suspicious lytic or sclerotic bone lesions identified on the corresponding CT images. Chronic healed right inferior pubic rami fracture noted. IMPRESSION: 1. Subpleural mass within the left upper lobe is intensely hypermetabolic compatible with primary bronchogenic carcinoma. There  is associated ipsilateral hilar and mediastinal nodal metastasis, liver metastasis and bone metastasis. 2.  Aortic Atherosclerosis (ICD10-I70.0). 3. 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 Recommend followup by ultrasound in 5 years. This recommendation follows ACR consensus guidelines: White Paper of the ACR Incidental Findings Committee II on Vascular Findings. J Am Coll Radiol 2013; 10:789-794. 4. Three vessel coronary artery calcifications noted. Electronically Signed   By: Kerby Moors M.D.   On: 07/17/2017 14:49   Ir US Guide Vasc Access Right  Result Date: 07/18/2017 INDICATION: 77 year old female with a history of lung carcinoma EXAM: IMPLANTED PORT A CATH PLACEMENT WITH ULTRASOUND AND FLUOROSCOPIC GUIDANCE MEDICATIONS: 2.0 g Ancef; The antibiotic was administered within an appropriate time interval prior to skin puncture. ANESTHESIA/SEDATION: Moderate (conscious) sedation was employed during this procedure. A total of Versed 2.0 mg and Fentanyl 100 mcg was administered intravenously. Moderate Sedation Time: 21 minutes. The patient's level of consciousness and vital signs were monitored continuously by radiology nursing throughout the procedure under my direct supervision. FLUOROSCOPY TIME:  0 minutes, 6 seconds (1 mGy) COMPLICATIONS: None PROCEDURE: The procedure, risks, benefits, and alternatives were explained to the patient. Questions regarding the  procedure were encouraged and answered. The patient understands and consents to the procedure. Ultrasound survey was performed with images stored and sent to PACs. The right neck and chest was prepped with chlorhexidine, and draped in the usual sterile fashion using maximum barrier technique (cap and mask, sterile gown, sterile gloves, large sterile sheet, hand hygiene and cutaneous antiseptic). Antibiotic prophylaxis was provided with 2.0g Ancef administered IV one hour prior to skin incision. Local anesthesia was attained by infiltration with 1% lidocaine without epinephrine. Ultrasound demonstrated patency of the right internal jugular vein, and this was documented with an image. Under real-time ultrasound guidance, this vein was accessed with a 21 gauge micropuncture needle and image documentation was performed. A small dermatotomy was made at the access site with an 11 scalpel. A 0.018" wire was advanced into the SVC and used to estimate the length of the internal catheter. The access needle exchanged for a 10F micropuncture vascular sheath. The 0.018" wire was then removed and a 0.035" wire advanced into the IVC. An appropriate location for the subcutaneous reservoir was selected below the clavicle and an incision was made through the skin and underlying soft tissues. The subcutaneous tissues were then dissected using a combination of blunt and sharp surgical technique and a pocket was formed. A single lumen power injectable portacatheter was then tunneled through the subcutaneous tissues from the pocket to the dermatotomy and the port reservoir placed within the subcutaneous pocket. The venous access site was then serially dilated and a peel away vascular sheath placed over the wire. The wire was removed and the port catheter advanced into position under fluoroscopic guidance. The catheter tip is positioned in the cavoatrial junction. This was documented with a spot image. The portacatheter was then tested and  found to flush and aspirate well. The port was flushed with saline followed by 100 units/mL heparinized saline. The pocket was then closed in two layers using first subdermal inverted interrupted absorbable sutures followed by a running subcuticular suture. The epidermis was then sealed with Dermabond. The dermatotomy at the venous access site was also seal with Dermabond. Patient tolerated the procedure well and remained hemodynamically stable throughout. No complications encountered and no significant blood loss encountered IMPRESSION: Status post right IJ port catheter placement. Catheter ready for use. Signed, Dulcy Fanny. Earleen Newport, DO [facility] Radiology Electronically Signed   By: Corrie Mckusick D.O.   On: 07/18/2017 11:00   Dg Chest Port 1 View  Result Date: 08/07/2017 CLINICAL DATA:  Wheezing. EXAM: PORTABLE CHEST 1 VIEW COMPARISON:  08/02/2017 FINDINGS: A right jugular Port-A-Cath terminates over the mid SVC. The cardiac silhouette is normal in size. Mild left hilar prominence corresponds to known lymphadenopathy. Aortic atherosclerosis and coronary artery stents are noted. The lungs remain hyperinflated. Peripheral opacity in the lateral left mid lung is slightly more prominent and corresponds to the previously demonstrated lung cancer. There is peribronchial scratched of there is increased peribronchial thickening and interstitial accentuation bilaterally with mild patchy airspace opacity in the right midlung and both lung bases. No sizable pleural effusion or pneumothorax is identified. No acute osseous abnormality is seen. IMPRESSION: Increasing bilateral interstitial and patchy airspace opacities concerning for worsening pneumonia. Electronically Signed   By: Logan Bores M.D.   On: 08/07/2017 09:30   Ir Fluoro Guide Port Insertion Right  Result Date: 07/18/2017 INDICATION: 77 year old female with a history of lung carcinoma EXAM: IMPLANTED PORT A CATH  PLACEMENT WITH ULTRASOUND AND FLUOROSCOPIC GUIDANCE MEDICATIONS: 2.0 g Ancef; The antibiotic was administered within an appropriate time interval prior to skin puncture. ANESTHESIA/SEDATION: Moderate (conscious) sedation was employed during this procedure. A total of Versed 2.0 mg and Fentanyl 100 mcg was administered intravenously. Moderate Sedation Time: 21 minutes. The patient's level of consciousness and vital signs were monitored continuously by radiology nursing throughout the procedure under my direct supervision. FLUOROSCOPY TIME:  0 minutes, 6 seconds (1 mGy) COMPLICATIONS: None PROCEDURE: The procedure, risks, benefits, and alternatives were explained to the patient. Questions regarding the procedure were encouraged and answered. The patient understands and consents to the procedure. Ultrasound survey was performed with images stored and sent to PACs. The right neck and chest was prepped with chlorhexidine, and draped in the usual sterile fashion using maximum barrier technique (cap and mask, sterile gown, sterile gloves, large sterile sheet, hand hygiene and cutaneous antiseptic). Antibiotic prophylaxis was provided with 2.0g Ancef administered IV one hour prior to skin incision. Local anesthesia was attained by infiltration with 1% lidocaine without epinephrine. Ultrasound demonstrated patency of the right internal jugular vein, and this was documented with an image. Under real-time ultrasound guidance, this vein was accessed with a 21 gauge micropuncture needle and image documentation was performed. A small dermatotomy was made at the access site with an 11 scalpel. A 0.018" wire was advanced into the SVC and used to estimate the length of the internal catheter. The access needle exchanged for a 57F micropuncture vascular sheath. The 0.018" wire was then removed and a 0.035" wire advanced into the IVC. An appropriate location for the subcutaneous reservoir was selected below the clavicle and an incision  was made through the skin and underlying soft tissues. The subcutaneous tissues were then dissected using a combination of blunt and sharp surgical technique and a pocket was formed. A single lumen power injectable portacatheter was then tunneled through the subcutaneous tissues from the pocket to the dermatotomy and the port reservoir placed within the subcutaneous pocket. The venous access site was then serially dilated and a peel away vascular sheath placed over the wire. The wire was removed and the port catheter advanced into position under fluoroscopic guidance. The catheter tip is positioned in the cavoatrial junction. This was documented with a spot image. The portacatheter was then tested and found to flush and aspirate well. The  port was flushed with saline followed by 100 units/mL heparinized saline. The pocket was then closed in two layers using first subdermal inverted interrupted absorbable sutures followed by a running subcuticular suture. The epidermis was then sealed with Dermabond. The dermatotomy at the venous access site was also seal with Dermabond. Patient tolerated the procedure well and remained hemodynamically stable throughout. No complications encountered and no significant blood loss encountered IMPRESSION: Status post right IJ port catheter placement. Catheter ready for use. Signed, Dulcy Fanny. Earleen Newport, DO [facility] Radiology Electronically Signed   By: Corrie Mckusick D.O.   On: 07/18/2017 11:00    Microbiology: Recent Results (from the past 240 hour(s))  Blood Culture (routine x 2)     Status: None   Collection Time: 08/03/17  1:40 AM  Result Value Ref Range Status   Specimen Description BLOOD RIGHT ARM  Final   Special Requests   Final    BOTTLES DRAWN AEROBIC AND ANAEROBIC Blood Culture adequate volume   Culture   Final    NO GROWTH 5 DAYS Performed at [facility], [address], [place], [place] [address]    Report  Status 08/08/2017 FINAL  Final  Blood Culture (routine x 2)     Status: None   Collection Time: 08/03/17  1:41 AM  Result Value Ref Range Status   Specimen Description BLOOD LEFT ARM  Final   Special Requests   Final    BOTTLES DRAWN AEROBIC AND ANAEROBIC Blood Culture adequate volume   Culture   Final    NO GROWTH 5 DAYS Performed at [facility], [place] [address], [place], [place] [address]    Report Status 08/08/2017 FINAL  Final  Respiratory Panel by PCR     Status: Abnormal   Collection Time: 08/03/17  5:32 AM  Result Value Ref Range Status   Adenovirus NOT DETECTED NOT DETECTED Final   Coronavirus 229E NOT DETECTED NOT DETECTED Final   Coronavirus HKU1 NOT DETECTED NOT DETECTED Final   Coronavirus NL63 NOT DETECTED NOT DETECTED Final   Coronavirus OC43 NOT DETECTED NOT DETECTED Final   Metapneumovirus NOT DETECTED NOT DETECTED Final   Rhinovirus / Enterovirus DETECTED (A) NOT DETECTED Final   Influenza A NOT DETECTED NOT DETECTED Final   Influenza B NOT DETECTED NOT DETECTED Final   Parainfluenza Virus 1 NOT DETECTED NOT DETECTED Final   Parainfluenza Virus 2 NOT DETECTED NOT DETECTED Final   Parainfluenza Virus 3 NOT DETECTED NOT DETECTED Final   Parainfluenza Virus 4 NOT DETECTED NOT DETECTED Final   Respiratory Syncytial Virus NOT DETECTED NOT DETECTED Final   Bordetella pertussis NOT DETECTED NOT DETECTED Final   Chlamydophila pneumoniae NOT DETECTED NOT DETECTED Final   Mycoplasma pneumoniae NOT DETECTED NOT DETECTED Final    Comment: Performed at [facility], [place] [address] [place], [place] [address]  Culture, Urine     Status: None   Collection Time: 08/03/17  7:17 PM  Result Value Ref Range Status   Specimen Description URINE, RANDOM  Final   Special Requests NONE  Final   Culture   Final    NO GROWTH Performed at [facility], [place] [address], [place], [place] [address]    Report Status 08/04/2017 FINAL  Final  Culture, sputum-assessment      Status: None   Collection Time: 08/04/17  5:24 AM  Result Value Ref Range Status   Specimen Description SPUTUM  Final  Special Requests NONE  Final   Sputum evaluation   Final    Sputum specimen not acceptable for testing.  Please recollect.   Gram Stain Report Called to,Read Back By and Verified With: RN Deneen Harts [id number] Performed at [facility], [place] [address] [place], [place] [address]    Report Status 08/04/2017 FINAL  Final  Culture, expectorated sputum-assessment     Status: None   Collection Time: 08/04/17  1:30 PM  Result Value Ref Range Status   Specimen Description SPUTUM  Final   Special Requests NONE  Final   Sputum evaluation   Final    THIS SPECIMEN IS ACCEPTABLE FOR SPUTUM CULTURE Performed at [facility], [place] [address] [place], [place] [address]    Report Status 08/04/2017 FINAL  Final  Culture, respiratory (NON-Expectorated)     Status: None   Collection Time: 08/04/17  1:30 PM  Result Value Ref Range Status   Specimen Description SPUTUM  Final   Special Requests NONE Reflexed from [id number]  Final   Gram Stain   Final    FEW WBC PRESENT, PREDOMINANTLY PMN RARE GRAM POSITIVE COCCI RARE GRAM NEGATIVE RODS    Culture   Final    FEW Consistent with normal respiratory flora. Performed at [facility], [place] [address] [place], [place] [address]    Report Status 08/07/2017 FINAL  Final     Labs: Basic Metabolic Panel: Recent Labs  Lab 08/03/17 1435 08/04/17 0711 08/05/17 0342 08/07/17 0432 08/09/17 0458  NA 133* 137 137 138 138  K 3.6 3.6 3.5 3.5 3.6  CL 100* 104 104 105 102  CO2 23 23 24 26 27   GLUCOSE 148* 119* 105* 124* 174*  BUN 18 11 11 7 9   CREATININE 0.88 0.86 0.73 0.79 0.71  CALCIUM 7.3* 7.1* 8.2* 8.3* 8.4*   Liver Function Tests: No results for input(s): AST, ALT, ALKPHOS, BILITOT, PROT, ALBUMIN in the last 168 hours. No results for input(s): LIPASE, AMYLASE in the last 168 hours. No results for input(s):  AMMONIA in the last 168 hours. CBC: Recent Labs  Lab 08/03/17 1435 08/04/17 0711 08/04/17 1704 08/05/17 0342 08/06/17 1036 08/07/17 0432 08/09/17 0458  WBC 5.9 7.0  --  11.2* 11.6* 12.8* 16.6*  NEUTROABS 3.1  --   --   --   --   --   --   HGB 7.5* 6.8* 9.3* 8.9* 9.9* 9.0* 8.6*  HCT 22.2* 20.4* 26.9*  27.3* 26.6* 29.5* 27.0* 26.4*  MCV 87.1 87.2  --  85.3 86.8 86.8 88.3  PLT 62* 52*  --  54* 64* 71* 159   Cardiac Enzymes: No results for input(s): CKTOTAL, CKMB, CKMBINDEX, TROPONINI in the last 168 hours. BNP: BNP (last 3 results) No results for input(s): BNP in the last 8760 hours.  ProBNP (last 3 results) No results for input(s): PROBNP in the last 8760 hours.  CBG: Recent Labs  Lab 08/09/17 1746 08/09/17 2001 08/09/17 2353 08/10/17 0407 08/10/17 0733  GLUCAP 208* 173* 220* 118* 144*       Signed:  Kayleen Memos, MD [facility] 08/10/2017, 10:50 AM

## 2017-08-10 NOTE — Progress Notes (Signed)
Nebulizer was delivered to pt in the room. Discharge instructions was given to pt and daughter. Pt feeling a lot better. Denies SOB. Discharged to home accompanied by daughter.

## 2017-08-10 NOTE — Progress Notes (Signed)
Notified [facility] that patient needs a nebulizer for home use. It will be delivered to the room prior to DC.

## 2017-08-10 NOTE — Discharge Instructions (Signed)
Cough, Adult A cough helps to clear your throat and lungs. A cough may last only 2-3 weeks (acute), or it may last longer than 8 weeks (chronic). Many different things can cause a cough. A cough may be a sign of an illness or another medical condition. Follow these instructions at home:  Pay attention to any changes in your cough.  Take medicines only as told by your doctor. ? If you were prescribed an antibiotic medicine, take it as told by your doctor. Do not stop taking it even if you start to feel better. ? Talk with your doctor before you try using a cough medicine.  Drink enough fluid to keep your pee (urine) clear or pale yellow.  If the air is dry, use a cold steam vaporizer or humidifier in your home.  Stay away from things that make you cough at work or at home.  If your cough is worse at night, try using extra pillows to raise your head up higher while you sleep.  Do not smoke, and try not to be around smoke. If you need help quitting, ask your doctor.  Do not have caffeine.  Do not drink alcohol.  Rest as needed. Contact a doctor if:  You have new problems (symptoms).  You cough up yellow fluid (pus).  Your cough does not get better after 2-3 weeks, or your cough gets worse.  Medicine does not help your cough and you are not sleeping well.  You have pain that gets worse or pain that is not helped with medicine.  You have a fever.  You are losing weight and you do not know why.  You have night sweats. Get help right away if:  You cough up blood.  You have trouble breathing.  Your heartbeat is very fast. This information is not intended to replace advice given to you by your health care provider. Make sure you discuss any questions you have with your health care provider. Document Released: 12/06/2010 Document Revised: 08/31/2015 Document Reviewed: 06/01/2014 Elsevier Interactive Patient Education  2018 [place] Lung cancer occurs  when abnormal cells in the lung grow out of control and form a mass (tumor). There are several types of lung cancer. The two most common types are:  Non-small cell. In this type of lung cancer, abnormal cells are larger and grow more slowly than those of small cell lung cancer.  Small cell. In this type of lung cancer, abnormal cells are smaller than those of non-small cell lung cancer. Small cell lung cancer gets worse faster than non-small cell lung cancer.  What are the causes? The leading cause of lung cancer is smoking tobacco. The second leading cause is radon exposure. What increases the risk?  Smoking tobacco.  Exposure to secondhand tobacco smoke.  Exposure to radon gas.  Exposure to asbestos.  Exposure to arsenic in drinking water.  Air pollution.  Family or personal history of lung cancer.  Lung radiation therapy.  Being older than 74 years. What are the signs or symptoms? In the early stages, symptoms may not be present. As the cancer progresses, symptoms may include:  A lasting cough, possibly with blood.  Fatigue.  Unexplained weight loss.  Shortness of breath.  Wheezing.  Chest pain.  Loss of appetite.  Symptoms of advanced lung cancer include:  Hoarseness.  Bone or joint pain.  Weakness.  Nail problems.  Face or arm swelling.  Paralysis of the face.  Drooping eyelids.  How  is this diagnosed? Lung cancer can be identified with a physical exam and with tests such as:  A chest X-ray.  A CT scan.  Blood tests.  A biopsy.  After a diagnosis is made, you will have more tests to determine the stage of the cancer. The stages of non-small cell lung cancer are:  Stage 0, also called carcinoma in situ. At this stage, abnormal cells are found in the inner lining of your lung or lungs.  Stage I. At this stage, abnormal cells have grown into a tumor that is no larger than 5 cm across. The cancer has entered the deeper lung tissue but has  not yet entered the lymph nodes or other parts of the body.  Stage II. At this stage, the tumor is 7 cm across or smaller and has entered nearby lymph nodes. Or, the tumor is 5 cm across or smaller and has invaded surrounding tissue but is not found in nearby lymph nodes. There may be more than one tumor present.  Stage III. At this stage, the tumor may be any size. There may be more than one tumor in the lungs. The cancer cells have spread to the lymph nodes and possibly to other organs.  Stage IV. At this stage, there are tumors in both lungs and the cancer has spread to other areas of the body.  The stages of small cell lung cancer are:  Limited. At this stage, the cancer is found only on one side of the chest.  Extensive. At this stage, the cancer is in the lungs and in tissues on the other side of the chest. The cancer has spread to other organs or is found in the fluid between the layers of your lungs.  How is this treated? Depending on the type and stage of your lung cancer, you may be treated with:  Surgery. This is done to remove a tumor.  Radiation therapy. This treatment destroys cancer cells using X-rays or other types of radiation.  Chemotherapy. This treatment uses medicines to destroy cancer cells.  Targeted therapy. This treatment aims to destroy only cancer cells instead of all cells as other therapies do.  You may also have a combination of treatments. Follow these instructions at home:  Do not use any tobacco products. This includes cigarettes, chewing tobacco, and electronic cigarettes. If you need help quitting, ask your health care provider.  Take medicines only as directed by your health care provider.  Eat a healthy diet. Work with a dietitian to make sure you are getting the nutrition you need.  Consider joining a support group or seeking counseling to help you cope with the stress of having lung cancer.  Let your cancer specialist (oncologist) know if you  are admitted to the hospital.  Keep all follow-up visits as directed by your health care provider. This is important. Contact a health care provider if:  You lose weight without trying.  You have a persistent cough and wheezing.  You feel short of breath.  You tire easily.  You experience bone or joint pain.  You have difficulty swallowing.  You feel hoarse or notice your voice changing.  Your pain medicine is not helping. Get help right away if:  You cough up blood.  You have new breathing problems.  You develop chest pain.  You develop swelling in: ? One or both ankles or legs. ? Your face, neck, or arms.  You are confused.  You experience paralysis in your face or  a drooping eyelid. This information is not intended to replace advice given to you by your health care provider. Make sure you discuss any questions you have with your health care provider. Document Released: 07/01/2000 Document Revised: 08/31/2015 Document Reviewed: 07/29/2013 Elsevier Interactive Patient Education  [employer].

## 2017-08-11 ENCOUNTER — Encounter: Payer: Self-pay | Admitting: Nurse Practitioner

## 2017-08-11 ENCOUNTER — Other Ambulatory Visit: Payer: Self-pay | Admitting: *Deleted

## 2017-08-11 ENCOUNTER — Inpatient Hospital Stay (HOSPITAL_BASED_OUTPATIENT_CLINIC_OR_DEPARTMENT_OTHER): Payer: Medicare Other | Admitting: Nurse Practitioner

## 2017-08-11 ENCOUNTER — Inpatient Hospital Stay: Payer: Medicare Other | Attending: Internal Medicine

## 2017-08-11 ENCOUNTER — Inpatient Hospital Stay: Payer: Medicare Other

## 2017-08-11 ENCOUNTER — Telehealth: Payer: Self-pay | Admitting: Nurse Practitioner

## 2017-08-11 VITALS — BP 155/60 | HR 65 | Temp 98.0°F | Resp 17 | Ht 61.0 in | Wt 83.6 lb

## 2017-08-11 DIAGNOSIS — J069 Acute upper respiratory infection, unspecified: Secondary | ICD-10-CM | POA: Insufficient documentation

## 2017-08-11 DIAGNOSIS — Z5189 Encounter for other specified aftercare: Secondary | ICD-10-CM | POA: Diagnosis not present

## 2017-08-11 DIAGNOSIS — C3412 Malignant neoplasm of upper lobe, left bronchus or lung: Secondary | ICD-10-CM

## 2017-08-11 DIAGNOSIS — J449 Chronic obstructive pulmonary disease, unspecified: Secondary | ICD-10-CM | POA: Diagnosis not present

## 2017-08-11 DIAGNOSIS — R0989 Other specified symptoms and signs involving the circulatory and respiratory systems: Secondary | ICD-10-CM | POA: Insufficient documentation

## 2017-08-11 DIAGNOSIS — R509 Fever, unspecified: Secondary | ICD-10-CM | POA: Diagnosis not present

## 2017-08-11 DIAGNOSIS — E860 Dehydration: Secondary | ICD-10-CM | POA: Diagnosis not present

## 2017-08-11 DIAGNOSIS — Z5111 Encounter for antineoplastic chemotherapy: Secondary | ICD-10-CM | POA: Insufficient documentation

## 2017-08-11 DIAGNOSIS — C3492 Malignant neoplasm of unspecified part of left bronchus or lung: Secondary | ICD-10-CM

## 2017-08-11 DIAGNOSIS — C778 Secondary and unspecified malignant neoplasm of lymph nodes of multiple regions: Secondary | ICD-10-CM | POA: Diagnosis not present

## 2017-08-11 DIAGNOSIS — Z95828 Presence of other vascular implants and grafts: Secondary | ICD-10-CM

## 2017-08-11 DIAGNOSIS — C787 Secondary malignant neoplasm of liver and intrahepatic bile duct: Secondary | ICD-10-CM | POA: Insufficient documentation

## 2017-08-11 DIAGNOSIS — Z720 Tobacco use: Secondary | ICD-10-CM | POA: Insufficient documentation

## 2017-08-11 DIAGNOSIS — R112 Nausea with vomiting, unspecified: Secondary | ICD-10-CM | POA: Diagnosis not present

## 2017-08-11 LAB — CBC WITH DIFFERENTIAL (CANCER CENTER ONLY)
BASOS ABS: 0.1 10*3/uL (ref 0.0–0.1)
Basophils Relative: 0 %
EOS ABS: 0 10*3/uL (ref 0.0–0.5)
Eosinophils Relative: 0 %
HCT: 31.6 % — ABNORMAL LOW (ref 34.8–46.6)
HEMOGLOBIN: 10.6 g/dL — AB (ref 11.6–15.9)
LYMPHS ABS: 2.2 10*3/uL (ref 0.9–3.3)
LYMPHS PCT: 13 %
MCH: 29.8 pg (ref 25.1–34.0)
MCHC: 33.5 g/dL (ref 31.5–36.0)
MCV: 88.8 fL (ref 79.5–101.0)
Monocytes Absolute: 1 10*3/uL — ABNORMAL HIGH (ref 0.1–0.9)
Monocytes Relative: 6 %
NEUTROS PCT: 81 %
Neutro Abs: 13.2 10*3/uL — ABNORMAL HIGH (ref 1.5–6.5)
Platelet Count: 464 10*3/uL — ABNORMAL HIGH (ref 145–400)
RBC: 3.56 MIL/uL — AB (ref 3.70–5.45)
RDW: 15 % — ABNORMAL HIGH (ref 11.2–14.5)
WBC: 16.6 10*3/uL — AB (ref 3.9–10.3)

## 2017-08-11 LAB — CMP (CANCER CENTER ONLY)
ALK PHOS: 146 U/L (ref 40–150)
ALT: 32 U/L (ref 0–55)
AST: 24 U/L (ref 5–34)
Albumin: 3 g/dL — ABNORMAL LOW (ref 3.5–5.0)
Anion gap: 7 (ref 3–11)
BUN: 17 mg/dL (ref 7–26)
CALCIUM: 9.6 mg/dL (ref 8.4–10.4)
CO2: 28 mmol/L (ref 22–29)
CREATININE: 0.76 mg/dL (ref 0.60–1.10)
Chloride: 102 mmol/L (ref 98–109)
GFR, Estimated: >60 mL/min (ref >60)
Glucose, Bld: 117 mg/dL (ref 70–140)
Potassium: 3.9 mmol/L (ref 3.5–5.1)
SODIUM: 137 mmol/L (ref 136–145)
Total Bilirubin: 0.3 mg/dL (ref 0.2–1.2)
Total Protein: 7.2 g/dL (ref 6.4–8.3)

## 2017-08-11 MED ORDER — SODIUM CHLORIDE 0.9% FLUSH
10.0000 mL | Freq: Once | INTRAVENOUS | Status: AC
  Administered 2017-08-11: 10 mL
  Filled 2017-08-11: qty 10

## 2017-08-11 MED ORDER — HEPARIN SOD (PORK) LOCK FLUSH 100 UNIT/ML IV SOLN
500.0000 [IU] | Freq: Once | INTRAVENOUS | Status: AC
  Administered 2017-08-11: 500 [IU]
  Filled 2017-08-11: qty 5

## 2017-08-11 NOTE — Telephone Encounter (Signed)
Gave pt avs and calendar with appts per 5/6 los.

## 2017-08-11 NOTE — Progress Notes (Signed)
[facility] OFFICE PROGRESS NOTE   DIAGNOSIS: Extensive stage (T2a, N2, M1c) small cell lung cancer presented with subpleural mass within the left upper lobe with associated ipsilateral hilar and mediastinal nodal metastasis, liver metastasis and bone metastasis  PRIOR THERAPY:None  CURRENT THERAPY: Systemic chemotherapy with carboplatin for AUC of 5 on day 1 and etoposide 100 mg/M2 on days 1, 2 and 3 with Neulasta support every 3 weeks.  Cycle 1 07/22/2017.      INTERVAL HISTORY:   Katherine Cole returns as scheduled.  She completed cycle 1 carboplatin/etoposide beginning 07/22/2017.  She was hospitalized 08/02/2017 with fever.  Total white count 2.8, absolute neutrophil count 1.8, platelet count 114,000.  She complained of a cough.  Initial chest x-ray showed a left upper lobe pleural-based lung mass, less apparent than on the prior study.  No acute disease.  CT chest on 08/08/2017 showed a 3 cm subpleural mass in the lateral left upper lobe, decreased in size; improving mediastinal and left perihilar nodal metastases; improving hepatic metastasis in segment 4B; mild patchy left lower lobe opacity suspicious for pneumonia, bibasilar atelectasis with associated small bilateral pleural effusions.  Blood cultures and urine culture negative.  Respiratory panel positive for rhinovirus.  She completed a 5-day course of vancomycin and Zosyn.  Platelet count declined to a low of 52,000 on 08/04/2017.  The total white count improved during the hospitalization with no further differential performed.  Hemoglobin declined to a low of 6.8 on 08/04/2017.  She was transfused a unit of blood.  She was discharged home 08/10/2017.  Cough is better.  No further fever.  She is doing nebulizer treatments at home.  She denies shortness of breath.  She denies pain.  She has a good appetite.  No nausea or vomiting.  No mouth sores.  No diarrhea.  Objective:  Vital signs in last 24 hours:  Blood pressure (!)  155/60, pulse 65, temperature 98 F (36.7 C), resp. rate 17, height 5\' 1"  (1.549 m), weight 83 lb 9.6 oz (37.9 kg), SpO2 100 %.    HEENT: No thrush or ulcers. Resp: Distant breath sounds.  Rales at the left lower lung field.  No respiratory distress. Cardio: Regular rate and rhythm. GI: Abdomen soft and nontender.  No hepatomegaly. Vascular: No leg edema.  Calves soft and nontender. Neuro: Alert and oriented. Skin: No rash. Port-A-Cath without erythema.   Lab Results:  Lab Results  Component Value Date   WBC 16.6 (H) 08/11/2017   HGB 10.6 (L) 08/11/2017   HCT 31.6 (L) 08/11/2017   MCV 88.8 08/11/2017   PLT 464 (H) 08/11/2017   NEUTROABS 13.2 (H) 08/11/2017    Imaging:  No results found.  Medications: I have reviewed the patient's current medications.  Assessment/Plan: 1. Extensive stage small cell lung cancer status post cycle 1 carboplatin/etoposide beginning 07/22/2017. 2. Hospitalization 08/02/2017 through 08/10/2017 presenting with fever and cough. 3. Respiratory panel positive for rhinovirus 08/03/2017 4. CT chest 08/08/2017- lateral left upper lobe mass decreased; improving mediastinal and left perihilar nodal metastases; improving hepatic metastasis segment 4B; mild patchy left lower lobe opacity suspicious for pneumonia.  Disposition: [name] has completed 1 cycle of carboplatin/etoposide.  She was hospitalized 08/02/2017 through 08/10/2017 with fever and cough.  Respiratory panel returned positive for rhinovirus.  Chest CT showed improvement in the left upper lobe mass, mediastinal and left perihilar adenopathy and improving hepatic metastasis; possible left lower lobe pneumonia.  She completed 5 days of vancomycin and Zosyn.  She is due to receive cycle 2 carboplatin/etoposide today.  Dr. Julien Nordmann recommends holding chemotherapy this week and rescheduling cycle 2 to begin 08/18/2017.  We will schedule lab, follow-up and cycle 3 carboplatin/etoposide 09/08/2017.  She will  contact the office between appointments with any problems.  Patient seen with Dr. Julien Nordmann.    Ned Card ANP/GNP-BC  08/11/2017  1:10 PM   ADDENDUM: Hematology/Oncology Attending: I had a face-to-face encounter with the patient today.  I recommended her care plan.  This is a very pleasant 77 years old white female recently diagnosed with extensive stage small cell lung cancer and currently undergoing systemic chemotherapy with carboplatin and etoposide status post 1 cycle.  She was recently admitted to [facility] with shortness of breath and questionable pneumonia.  She was treated with vancomycin and Zosyn during her hospitalization.  Her pneumonia was likely viral in origin.  She had repeat CT scan of the chest performed during her hospitalization and that showed improvement of her disease. The patient was just discharged from the hospital yesterday.  I recommended for her to delay the start of cycle #2 of her chemotherapy by 1 week until she has improvement of her condition. She will come back for follow-up visit with the start of cycle #3. The patient was advised to call immediately if she has any concerning symptoms in the interval.  Disclaimer: This note was dictated with voice recognition software. Similar sounding words can inadvertently be transcribed and may be missed upon review. [name], MD 08/11/17

## 2017-08-12 ENCOUNTER — Ambulatory Visit: Payer: Medicare Other

## 2017-08-12 ENCOUNTER — Encounter: Payer: Medicare Other | Admitting: Nutrition

## 2017-08-12 ENCOUNTER — Ambulatory Visit: Payer: Medicare Other | Admitting: Internal Medicine

## 2017-08-13 ENCOUNTER — Emergency Department (HOSPITAL_COMMUNITY): Payer: Medicare Other

## 2017-08-13 ENCOUNTER — Encounter (HOSPITAL_COMMUNITY): Payer: Self-pay

## 2017-08-13 ENCOUNTER — Emergency Department (HOSPITAL_COMMUNITY)
Admission: EM | Admit: 2017-08-13 | Discharge: 2017-08-14 | Disposition: A | Discharge status: Home / Self Care | Payer: Medicare Other | Source: Home / Self Care | Attending: Emergency Medicine | Admitting: Emergency Medicine

## 2017-08-13 ENCOUNTER — Other Ambulatory Visit: Payer: Self-pay

## 2017-08-13 ENCOUNTER — Ambulatory Visit: Payer: Medicare Other

## 2017-08-13 DIAGNOSIS — E11649 Type 2 diabetes mellitus with hypoglycemia without coma: Secondary | ICD-10-CM | POA: Diagnosis not present

## 2017-08-13 DIAGNOSIS — C3412 Malignant neoplasm of upper lobe, left bronchus or lung: Secondary | ICD-10-CM | POA: Diagnosis not present

## 2017-08-13 DIAGNOSIS — M6281 Muscle weakness (generalized): Secondary | ICD-10-CM | POA: Insufficient documentation

## 2017-08-13 DIAGNOSIS — Z955 Presence of coronary angioplasty implant and graft: Secondary | ICD-10-CM | POA: Insufficient documentation

## 2017-08-13 DIAGNOSIS — C787 Secondary malignant neoplasm of liver and intrahepatic bile duct: Secondary | ICD-10-CM | POA: Insufficient documentation

## 2017-08-13 DIAGNOSIS — J90 Pleural effusion, not elsewhere classified: Secondary | ICD-10-CM | POA: Insufficient documentation

## 2017-08-13 DIAGNOSIS — Z800 Family history of malignant neoplasm of digestive organs: Secondary | ICD-10-CM | POA: Insufficient documentation

## 2017-08-13 DIAGNOSIS — Z79899 Other long term (current) drug therapy: Secondary | ICD-10-CM | POA: Insufficient documentation

## 2017-08-13 DIAGNOSIS — Z86010 Personal history of colonic polyps: Secondary | ICD-10-CM | POA: Insufficient documentation

## 2017-08-13 DIAGNOSIS — E860 Dehydration: Secondary | ICD-10-CM | POA: Insufficient documentation

## 2017-08-13 DIAGNOSIS — Z8673 Personal history of transient ischemic attack (TIA), and cerebral infarction without residual deficits: Secondary | ICD-10-CM | POA: Insufficient documentation

## 2017-08-13 DIAGNOSIS — M19049 Primary osteoarthritis, unspecified hand: Secondary | ICD-10-CM | POA: Insufficient documentation

## 2017-08-13 DIAGNOSIS — R627 Adult failure to thrive: Secondary | ICD-10-CM | POA: Insufficient documentation

## 2017-08-13 DIAGNOSIS — C7951 Secondary malignant neoplasm of bone: Secondary | ICD-10-CM | POA: Insufficient documentation

## 2017-08-13 DIAGNOSIS — R600 Localized edema: Secondary | ICD-10-CM | POA: Insufficient documentation

## 2017-08-13 DIAGNOSIS — Z794 Long term (current) use of insulin: Secondary | ICD-10-CM | POA: Insufficient documentation

## 2017-08-13 DIAGNOSIS — C771 Secondary and unspecified malignant neoplasm of intrathoracic lymph nodes: Secondary | ICD-10-CM | POA: Diagnosis not present

## 2017-08-13 DIAGNOSIS — I10 Essential (primary) hypertension: Secondary | ICD-10-CM | POA: Insufficient documentation

## 2017-08-13 DIAGNOSIS — I088 Other rheumatic multiple valve diseases: Secondary | ICD-10-CM | POA: Diagnosis not present

## 2017-08-13 DIAGNOSIS — Z85118 Personal history of other malignant neoplasm of bronchus and lung: Secondary | ICD-10-CM | POA: Insufficient documentation

## 2017-08-13 DIAGNOSIS — F17210 Nicotine dependence, cigarettes, uncomplicated: Secondary | ICD-10-CM

## 2017-08-13 DIAGNOSIS — E440 Moderate protein-calorie malnutrition: Secondary | ICD-10-CM | POA: Insufficient documentation

## 2017-08-13 DIAGNOSIS — Z85828 Personal history of other malignant neoplasm of skin: Secondary | ICD-10-CM | POA: Insufficient documentation

## 2017-08-13 DIAGNOSIS — I2510 Atherosclerotic heart disease of native coronary artery without angina pectoris: Secondary | ICD-10-CM

## 2017-08-13 DIAGNOSIS — I252 Old myocardial infarction: Secondary | ICD-10-CM | POA: Insufficient documentation

## 2017-08-13 DIAGNOSIS — Z801 Family history of malignant neoplasm of trachea, bronchus and lung: Secondary | ICD-10-CM | POA: Insufficient documentation

## 2017-08-13 DIAGNOSIS — Z885 Allergy status to narcotic agent status: Secondary | ICD-10-CM | POA: Insufficient documentation

## 2017-08-13 DIAGNOSIS — Z7902 Long term (current) use of antithrombotics/antiplatelets: Secondary | ICD-10-CM | POA: Insufficient documentation

## 2017-08-13 DIAGNOSIS — Z9221 Personal history of antineoplastic chemotherapy: Secondary | ICD-10-CM | POA: Diagnosis not present

## 2017-08-13 DIAGNOSIS — E119 Type 2 diabetes mellitus without complications: Secondary | ICD-10-CM

## 2017-08-13 DIAGNOSIS — Z7982 Long term (current) use of aspirin: Secondary | ICD-10-CM

## 2017-08-13 DIAGNOSIS — Z66 Do not resuscitate: Secondary | ICD-10-CM | POA: Insufficient documentation

## 2017-08-13 DIAGNOSIS — E785 Hyperlipidemia, unspecified: Secondary | ICD-10-CM | POA: Diagnosis not present

## 2017-08-13 DIAGNOSIS — R531 Weakness: Secondary | ICD-10-CM

## 2017-08-13 DIAGNOSIS — Z681 Body mass index (BMI) 19 or less, adult: Secondary | ICD-10-CM | POA: Insufficient documentation

## 2017-08-13 DIAGNOSIS — I700 Atherosclerosis of aorta: Secondary | ICD-10-CM | POA: Diagnosis not present

## 2017-08-13 LAB — COMPREHENSIVE METABOLIC PANEL
ALBUMIN: 2.9 g/dL — AB (ref 3.5–5.0)
ALK PHOS: 127 U/L — AB (ref 38–126)
ALT: 24 U/L (ref 14–54)
AST: 20 U/L (ref 15–41)
Anion gap: 8 (ref 5–15)
BUN: 25 mg/dL — AB (ref 6–20)
CALCIUM: 9.1 mg/dL (ref 8.9–10.3)
CHLORIDE: 105 mmol/L (ref 101–111)
CO2: 26 mmol/L (ref 22–32)
CREATININE: 0.83 mg/dL (ref 0.44–1.00)
GFR calc Af Amer: >60 mL/min (ref >60)
GFR calc non Af Amer: >60 mL/min (ref >60)
GLUCOSE: 210 mg/dL — AB (ref 65–99)
Potassium: 4.2 mmol/L (ref 3.5–5.1)
SODIUM: 139 mmol/L (ref 135–145)
Total Bilirubin: 0.6 mg/dL (ref 0.3–1.2)
Total Protein: 6.7 g/dL (ref 6.5–8.1)

## 2017-08-13 LAB — CBC WITH DIFFERENTIAL/PLATELET
BASOS PCT: 1 %
Basophils Absolute: 0.1 10*3/uL (ref 0.0–0.1)
EOS PCT: 0 %
Eosinophils Absolute: 0 10*3/uL (ref 0.0–0.7)
HCT: 32 % — ABNORMAL LOW (ref 36.0–46.0)
Hemoglobin: 10.5 g/dL — ABNORMAL LOW (ref 12.0–15.0)
LYMPHS ABS: 2.6 10*3/uL (ref 0.7–4.0)
Lymphocytes Relative: 19 %
MCH: 29.5 pg (ref 26.0–34.0)
MCHC: 32.8 g/dL (ref 30.0–36.0)
MCV: 89.9 fL (ref 78.0–100.0)
MONO ABS: 1.3 10*3/uL — AB (ref 0.1–1.0)
Monocytes Relative: 9 %
NEUTROS ABS: 9.9 10*3/uL — AB (ref 1.7–7.7)
Neutrophils Relative %: 71 %
PLATELETS: 787 10*3/uL — AB (ref 150–400)
RBC: 3.56 MIL/uL — ABNORMAL LOW (ref 3.87–5.11)
RDW: 16 % — ABNORMAL HIGH (ref 11.5–15.5)
WBC: 13.9 10*3/uL — ABNORMAL HIGH (ref 4.0–10.5)

## 2017-08-13 LAB — I-STAT CG4 LACTIC ACID, ED: LACTIC ACID, VENOUS: 0.99 mmol/L (ref 0.5–1.9)

## 2017-08-13 NOTE — ED Provider Notes (Signed)
[facility] EMERGENCY DEPARTMENT Provider Note   CSN: [id number] Arrival date & time: 08/13/17  1708     History   Chief Complaint Chief Complaint  Patient presents with  . Weakness    HPI Katherine Cole is a 77 y.o. female.  77 year old female with prior history of CAD, diabetes, hyperlipidemia, hypertension, lung cancer, and pneumonia presents for evaluation of feeling weak.  Patient reportedly was in inpatient last week for 8 days.  She was discharged on Sunday.  She was in the hospital [facility]) for pneumonia.  She presented today for her post hospitalization follow-up visit with her primary doctor.  Her PMD felt that her lung exam was abnormal and sent her to the ED for evaluation - he was concerned about recurrent pneumonia.   She denies shortness of breath, chest pain, fever, or other acute complaint now.  She reports that she has felt somewhat weak ever since the admission.  The history is provided by the patient and a relative.  Weakness  Primary symptoms include no focal weakness. This is a new problem. The current episode started more than 1 week ago. The problem has not changed since onset.There was no focality noted. There has been no fever. Pertinent negatives include no shortness of breath, no chest pain, no vomiting and no altered mental status.    Past Medical History:  Diagnosis Date  . Arthritis    "in my fingers"   . CAD (coronary artery disease)    a. MI 1999 with 2 stents at that time followed by stenting 3-4 yrs later. b. s/p DES to LAD guided by pressure wire 03/11/12.  . Colon polyps    adenomatous  . Depression   . Diabetes mellitus, type 2 ([place])   . Hyperlipemia   . Hypertension   . Osteoarthritis   . Pneumonia 06/2017  . Skin cancer 2012   "nose; right middle finger"  . Stroke [facility])     Patient Active Problem List   Diagnosis Date Noted  . Sepsis due to pneumonia ([place]) 08/03/2017  . Antineoplastic chemotherapy induced pancytopenia  ([place]) 08/03/2017  . Pancytopenia ([place]) 08/03/2017  . Hyperlipemia 08/03/2017  . Viral pneumonia 08/03/2017  . DNR (do not resuscitate) 07/29/2017  . Port-A-Cath in place 07/22/2017  . Encounter for antineoplastic chemotherapy 07/12/2017  . Goals of care, counseling/discussion 07/12/2017  . Moderate protein-calorie malnutrition ([place]) 07/12/2017  . Encounter for smoking cessation counseling 07/12/2017  . 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 Primary malignant neoplasm of left upper lobe of lung ([place]) 07/08/2017  . Malnutrition of moderate degree 07/02/2017  . CAP (community acquired pneumonia) 07/01/2017  . Lung mass 07/01/2017  . Atypical pneumonia   . Acute CVA (cerebrovascular accident) ([place]) 01/20/2014  . Exertional angina ([place]) 03/12/2012  . CAD (coronary artery disease) 02/10/2012  . Tobacco abuse 02/10/2012  . Essential hypertension 02/10/2012  . Benign neoplasm of colon 08/01/2007  . Type 2 diabetes mellitus with hypoglycemia without coma ([place]) 08/01/2007  . Hyperlipidemia 08/01/2007  . GASTROESOPHAGEAL REFLUX DISEASE 08/01/2007  . OSTEOARTHRITIS 08/01/2007    Past Surgical History:  Procedure Laterality Date  . APPENDECTOMY  ~ 1965  . CATARACT EXTRACTION    . CORONARY ANGIOPLASTY WITH STENT PLACEMENT  1999; 2004; 03/11/2012   "2 + 1 + 1; total of 4" (03/11/2012)  . FEMUR FRACTURE SURGERY  2011   RLE (03/11/2012)  . IR FLUORO GUIDE PORT INSERTION RIGHT  07/18/2017  . IR  US GUIDE VASC ACCESS RIGHT  07/18/2017  . PERCUTANEOUS CORONARY STENT INTERVENTION (PCI-S) N/A 03/11/2012   Procedure: PERCUTANEOUS CORONARY STENT INTERVENTION (PCI-S);  Surgeon: Sherren Mocha, MD;  Location: [facility] CATH LAB;  Service: Cardiovascular;  Laterality: N/A;  . SKIN CANCER EXCISION  2013   "nose & right middle finger; actinic keratosis" (03/11/2012)  . TONSILLECTOMY AND ADENOIDECTOMY  ~ 1962  . TUBAL LIGATION  1977  . VIDEO BRONCHOSCOPY WITH ENDOBRONCHIAL ULTRASOUND N/A  07/04/2017   Procedure: VIDEO BRONCHOSCOPY WITH ENDOBRONCHIAL ULTRASOUND;  Surgeon: Collene Gobble, MD;  Location: [facility] OR;  Service: Thoracic;  Laterality: N/A;     OB History   None      Home Medications    Prior to Admission medications   Medication Sig Start Date End Date Taking? Authorizing Provider  ALPRAZolam Duanne Moron) 0.5 MG tablet Take 1 tablet (0.5 mg total) by mouth at bedtime as needed for anxiety or sleep. 07/08/17 07/08/18  Roxan Hockey, MD  amLODipine (NORVASC) 10 MG tablet Take 1 tablet (10 mg total) by mouth daily. 08/11/17   Kayleen Memos, DO  aspirin 81 MG tablet Take 1 tablet (81 mg total) by mouth daily. 01/21/14   Annita Brod, MD  atorvastatin (LIPITOR) 80 MG tablet Take 80 mg by mouth daily.    [provider]  clopidogrel (PLAVIX) 75 MG tablet Take 75 mg by mouth daily.    [provider]  fluticasone (FLONASE) 50 MCG/ACT nasal spray Place 1 spray into both nostrils daily. 08/09/17   Donita Brooks, NP  guaiFENesin (MUCINEX) 600 MG 12 hr tablet Take 2 tablets (1,200 mg total) by mouth 2 (two) times daily as needed (Use for one week post discharge, then twice daily as needed). 08/09/17   Donita Brooks, NP  HUMALOG KWIKPEN 100 UNIT/ML KiwkPen 0-8 units three times daily after meals 07/23/17   [provider]  insulin glargine (LANTUS) 100 UNIT/ML injection Inject 0.08 mLs (8 Units total) into the skin at bedtime. Patient taking differently: Inject 15 Units into the skin daily.  07/08/17   Roxan Hockey, MD  ipratropium-albuterol (DUONEB) 0.5-2.5 (3) MG/3ML SOLN Take 3 mLs by nebulization every 6 (six) hours as needed (Shortness of breath or wheezing). 08/09/17   Donita Brooks, NP  lactose free nutrition (BOOST PLUS) LIQD Take 237 mLs by mouth 3 (three) times daily between meals. 08/10/17   Kayleen Memos, DO  metFORMIN (GLUCOPHAGE) 1000 MG tablet Take 1,000 mg by mouth 2 (two) times daily with a meal.    [provider]  metoprolol  succinate (TOPROL-XL) 50 MG 24 hr tablet Take 50 mg by mouth daily. Take with or immediately following a meal.    [provider]  nitroGLYCERIN (NITROSTAT) 0.4 MG SL tablet Place 1 tablet (0.4 mg total) under the tongue every 5 (five) minutes as needed (up to 3 doses). For chest pain 03/12/12   Dunn, Lisbeth Renshaw N, PA-C  ondansetron (ZOFRAN ODT) 4 MG disintegrating tablet Take 1 tablet (4 mg total) by mouth every 8 (eight) hours as needed for nausea or vomiting. 07/08/17   Roxan Hockey, MD  prochlorperazine (COMPAZINE) 10 MG tablet Take 1 tablet (10 mg total) by mouth every 6 (six) hours as needed for nausea or vomiting. 07/22/17   Curt Bears, MD  sitaGLIPtin (JANUVIA) 100 MG tablet Take 0.5 tablets (50 mg total) by mouth daily. 08/10/17   Kayleen Memos, DO  sodium chloride (OCEAN) 0.65 % SOLN nasal spray Place 1  spray into both nostrils as needed for congestion (use twice daily for one week post discharge, then as needed). 08/09/17   Donita Brooks, NP    Family History Family History  Problem Relation Age of Onset  . Liver cancer Mother   . Brain cancer Father   . Stomach cancer Father   . Colon cancer Brother 70  . Lung cancer Sister   . Lung cancer Brother   . Stomach cancer Brother   . Diabetes Daughter     Social History Social History   Tobacco Use  . Smoking status: Current Every Day Smoker    Packs/day: 0.50    Years: 51.00    Pack years: 25.50    Types: Cigarettes  . Smokeless tobacco: Never Used  . Tobacco comment: once in a while 08-13-17  Substance Use Topics  . Alcohol use: No  . Drug use: No     Allergies   Codeine and Morphine and related   Review of Systems Review of Systems  Respiratory: Negative for shortness of breath.   Cardiovascular: Negative for chest pain.  Gastrointestinal: Negative for vomiting.  Neurological: Positive for weakness. Negative for focal weakness.  All other systems reviewed and are negative.    Physical Exam Updated  Vital Signs BP (!) 163/70 (BP Location: Right Arm)   Pulse 74   Temp 98.3 F (36.8 C) (Oral)   Resp (!) 24   Ht 5\' 1"  (1.549 m)   Wt 37.6 kg (83 lb)   SpO2 98%   BMI 15.68 kg/m   Physical Exam  Constitutional: She is oriented to person, place, and time. She appears well-developed. No distress.  Chronically ill in appearance   HENT:  Head: Normocephalic and atraumatic.  Mouth/Throat: Oropharynx is clear and moist.  Eyes: Pupils are equal, round, and reactive to light. Conjunctivae and EOM are normal.  Neck: Normal range of motion. Neck supple.  Cardiovascular: Normal rate, regular rhythm and normal heart sounds.  Pulmonary/Chest: Effort normal and breath sounds normal. No respiratory distress.  Abdominal: Soft. She exhibits no distension. There is no tenderness.  Musculoskeletal: Normal range of motion. She exhibits no edema or deformity.  Neurological: She is alert and oriented to person, place, and time. No cranial nerve deficit or sensory deficit. She exhibits normal muscle tone. Coordination normal.  Normal speech No facial droop 5 out of 5 strength in all 4 extremities   Skin: Skin is warm and dry.  Psychiatric: She has a normal mood and affect.  Nursing note and vitals reviewed.    ED Treatments / Results  Labs (all labs ordered are listed, but only abnormal results are displayed) Labs Reviewed  COMPREHENSIVE METABOLIC PANEL - Abnormal; Notable for the following components:      Result Value   Glucose, Bld 210 (*)    BUN 25 (*)    Albumin 2.9 (*)    Alkaline Phosphatase 127 (*)    All other components within normal limits  CBC WITH DIFFERENTIAL/PLATELET - Abnormal; Notable for the following components:   WBC 13.9 (*)    RBC 3.56 (*)    Hemoglobin 10.5 (*)    HCT 32.0 (*)    RDW 16.0 (*)    Platelets 787 (*)    Neutro Abs 9.9 (*)    Monocytes Absolute 1.3 (*)    All other components within normal limits  URINALYSIS, ROUTINE W REFLEX MICROSCOPIC  I-STAT CG4  LACTIC ACID, ED  I-STAT CG4 LACTIC ACID, ED  EKG None  Radiology Dg Chest 2 View  Result Date: 08/13/2017 CLINICAL DATA:  Patient hospitalized last week for pneumonia with persistent productive cough. History of left upper lobe lung cancer. Patient currently on chemotherapy. EXAM: CHEST - 2 VIEW COMPARISON:  08/07/2017 and chest CT 08/08/2017 FINDINGS: Right IJ Port-A-Cath unchanged. Lungs are adequately inflated with no significant change in patient's peripheral lateral left upper lobe lung cancer. No definite focal airspace consolidation. Mild prominence of the central perihilar markings which may indicate a mild degree of vascular congestion. No effusion. Cardiomediastinal silhouette and remainder of the exam is unchanged. IMPRESSION: No evidence of pneumonia. Suggestion mild vascular congestion. Known stable lung cancer over the lateral left upper lobe. Electronically Signed   By: Marin Olp M.D.   On: 08/13/2017 19:18    Procedures Procedures (including critical care time)  Medications Ordered in ED Medications - No data to display   Initial Impression / Assessment and Plan / ED Course  I have reviewed the triage vital signs and the nursing notes.  Pertinent labs & imaging results that were available during my care of the patient were reviewed by me and considered in my medical decision making (see chart for details).     MDM  Screen complete  Patient is presenting to the ED for reevaluation at the request of her PMD given recent admission for pneumonia.  Patient is without complaint at this time.  The PMD was concerned about recurrent or continued pneumonia.  Patient is without evidence of pneumonia on exam.  Chest x-ray is clear.  Baseline screening labs obtained in the ED do not suggest acute infection, dehydration, or other acute process.  The patient feels improved following her ED evaluation she desires discharge home.  Strict return precautions are given and  understood.  Close follow-up is advised.  Final Clinical Impressions(s) / ED Diagnoses   Final diagnoses:  Weakness    ED Discharge Orders    None       Valarie Merino, MD 08/13/17 2339

## 2017-08-13 NOTE — ED Triage Notes (Signed)
Pt states she was recently admitted for 8 days r/t pneumonia, was seen by PCP today for follow up and was told to come back to hospital for concern of pneumonia still. Family states that pt was septic last visit. Pt is being treated for lung cancer but MD advised she skip this week to 'get her strength up'. Denies fever at home c/o feeling weak.

## 2017-08-13 NOTE — Discharge Instructions (Addendum)
These return for any problem.  Follow-up with your regular doctor as instructed.

## 2017-08-14 ENCOUNTER — Telehealth: Payer: Self-pay | Admitting: Pulmonary Disease

## 2017-08-14 ENCOUNTER — Encounter (HOSPITAL_COMMUNITY): Payer: Self-pay

## 2017-08-14 ENCOUNTER — Other Ambulatory Visit: Payer: Self-pay

## 2017-08-14 ENCOUNTER — Emergency Department (HOSPITAL_COMMUNITY): Payer: Medicare Other

## 2017-08-14 ENCOUNTER — Observation Stay (HOSPITAL_COMMUNITY)
Admission: EM | Admit: 2017-08-14 | Discharge: 2017-08-16 | Disposition: A | Discharge status: Home / Self Care | Payer: Medicare Other | Attending: Internal Medicine | Admitting: Internal Medicine

## 2017-08-14 DIAGNOSIS — E46 Unspecified protein-calorie malnutrition: Secondary | ICD-10-CM | POA: Diagnosis present

## 2017-08-14 DIAGNOSIS — E785 Hyperlipidemia, unspecified: Secondary | ICD-10-CM | POA: Diagnosis present

## 2017-08-14 DIAGNOSIS — C7931 Secondary malignant neoplasm of brain: Secondary | ICD-10-CM

## 2017-08-14 DIAGNOSIS — F17210 Nicotine dependence, cigarettes, uncomplicated: Secondary | ICD-10-CM | POA: Diagnosis present

## 2017-08-14 DIAGNOSIS — R531 Weakness: Secondary | ICD-10-CM

## 2017-08-14 DIAGNOSIS — Z95828 Presence of other vascular implants and grafts: Secondary | ICD-10-CM

## 2017-08-14 DIAGNOSIS — I10 Essential (primary) hypertension: Secondary | ICD-10-CM | POA: Diagnosis present

## 2017-08-14 DIAGNOSIS — C3490 Malignant neoplasm of unspecified part of unspecified bronchus or lung: Secondary | ICD-10-CM | POA: Diagnosis present

## 2017-08-14 DIAGNOSIS — D61818 Other pancytopenia: Secondary | ICD-10-CM | POA: Diagnosis present

## 2017-08-14 DIAGNOSIS — E860 Dehydration: Secondary | ICD-10-CM | POA: Diagnosis present

## 2017-08-14 DIAGNOSIS — I2510 Atherosclerotic heart disease of native coronary artery without angina pectoris: Secondary | ICD-10-CM | POA: Diagnosis present

## 2017-08-14 DIAGNOSIS — E11649 Type 2 diabetes mellitus with hypoglycemia without coma: Secondary | ICD-10-CM | POA: Diagnosis present

## 2017-08-14 DIAGNOSIS — I639 Cerebral infarction, unspecified: Secondary | ICD-10-CM | POA: Diagnosis present

## 2017-08-14 LAB — COMPREHENSIVE METABOLIC PANEL
ALK PHOS: 110 U/L (ref 38–126)
ALT: 22 U/L (ref 14–54)
ANION GAP: 8 (ref 5–15)
AST: 21 U/L (ref 15–41)
Albumin: 2.9 g/dL — ABNORMAL LOW (ref 3.5–5.0)
BILIRUBIN TOTAL: 0.3 mg/dL (ref 0.3–1.2)
BUN: 30 mg/dL — ABNORMAL HIGH (ref 6–20)
CALCIUM: 8.4 mg/dL — AB (ref 8.9–10.3)
CO2: 26 mmol/L (ref 22–32)
Chloride: 104 mmol/L (ref 101–111)
Creatinine, Ser: 0.91 mg/dL (ref 0.44–1.00)
GFR calc non Af Amer: >60 mL/min (ref >60)
Glucose, Bld: 280 mg/dL — ABNORMAL HIGH (ref 65–99)
POTASSIUM: 5.4 mmol/L — AB (ref 3.5–5.1)
SODIUM: 138 mmol/L (ref 135–145)
TOTAL PROTEIN: 6.6 g/dL (ref 6.5–8.1)

## 2017-08-14 LAB — CBC WITH DIFFERENTIAL/PLATELET
BASOS ABS: 0.1 10*3/uL (ref 0.0–0.1)
BASOS PCT: 0 %
Eosinophils Absolute: 0 10*3/uL (ref 0.0–0.7)
Eosinophils Relative: 0 %
HEMATOCRIT: 27.8 % — AB (ref 36.0–46.0)
HEMOGLOBIN: 9.2 g/dL — AB (ref 12.0–15.0)
Lymphocytes Relative: 16 %
Lymphs Abs: 2.7 10*3/uL (ref 0.7–4.0)
MCH: 29.5 pg (ref 26.0–34.0)
MCHC: 33.1 g/dL (ref 30.0–36.0)
MCV: 89.1 fL (ref 78.0–100.0)
Monocytes Absolute: 1.1 10*3/uL — ABNORMAL HIGH (ref 0.1–1.0)
Monocytes Relative: 7 %
NEUTROS PCT: 77 %
Neutro Abs: 13 10*3/uL — ABNORMAL HIGH (ref 1.7–7.7)
Platelets: 835 10*3/uL — ABNORMAL HIGH (ref 150–400)
RBC: 3.12 MIL/uL — ABNORMAL LOW (ref 3.87–5.11)
RDW: 16.1 % — ABNORMAL HIGH (ref 11.5–15.5)
WBC: 16.8 10*3/uL — ABNORMAL HIGH (ref 4.0–10.5)

## 2017-08-14 LAB — TROPONIN I: Troponin I: <0.03 ng/mL (ref <0.03)

## 2017-08-14 MED ORDER — SODIUM CHLORIDE 0.9 % IV SOLN
Freq: Once | INTRAVENOUS | Status: AC
  Administered 2017-08-14: via INTRAVENOUS

## 2017-08-14 MED ORDER — LIDOCAINE-PRILOCAINE 2.5-2.5 % EX CREA
TOPICAL_CREAM | Freq: Once | CUTANEOUS | Status: AC
  Administered 2017-08-14: 1 via TOPICAL
  Filled 2017-08-14: qty 5

## 2017-08-14 NOTE — Telephone Encounter (Signed)
Called and spoke with patients daughter. Patient was consulted by our physicians in the hospital. Hospital follow up was made with MW.

## 2017-08-14 NOTE — ED Notes (Signed)
Waiting 15 to 20 mins for cream to work then accessing port for blood. Provider notified

## 2017-08-14 NOTE — ED Provider Notes (Signed)
[facility] DEPT Provider Note   CSN: [id number] Arrival date & time: 08/14/17  2005     History   Chief Complaint Chief Complaint  Patient presents with  . Foot Swelling  . cancer patient    HPI Katherine Cole is a 77 y.o. female.  77 year old female with prior history of CAD, diabetes, hypertension, hyperlipidemia, pneumonia, and lung cancer for evaluation of weakness.  Patient was admitted last week for pneumonia.  After her discharge on Sunday she has persistently felt weak.  Of note, she was seen at the [facility] ED yesterday for the same complaint.  Screening labs and chest x-ray performed yesterday did not show acute abnormalities.  Today she reports continued weakness. The patient's family reports that the patient feet are swollen today.   No fever. No chest pain, no shortness of breath, no abdominal pain, or other more acute complaint.   The history is provided by the patient.  Illness  This is a new problem. The current episode started more than 2 days ago. The problem occurs daily. The problem has not changed since onset.Pertinent negatives include no chest pain, no abdominal pain, no headaches and no shortness of breath. Nothing aggravates the symptoms. Nothing relieves the symptoms. She has tried nothing for the symptoms.    Past Medical History:  Diagnosis Date  . Arthritis    "in my fingers"   . CAD (coronary artery disease)    a. MI 1999 with 2 stents at that time followed by stenting 3-4 yrs later. b. s/p DES to LAD guided by pressure wire 03/11/12.  . Colon polyps    adenomatous  . Depression   . Diabetes mellitus, type 2 ([place])   . Hyperlipemia   . Hypertension   . Osteoarthritis   . Pneumonia 06/2017  . Skin cancer 2012   "nose; right middle finger"  . Stroke [facility])     Patient Active Problem List   Diagnosis Date Noted  . Sepsis due to pneumonia ([place]) 08/03/2017  . Antineoplastic chemotherapy induced pancytopenia ([place])  08/03/2017  . Pancytopenia ([place]) 08/03/2017  . Hyperlipemia 08/03/2017  . Viral pneumonia 08/03/2017  . DNR (do not resuscitate) 07/29/2017  . Port-A-Cath in place 07/22/2017  . Encounter for antineoplastic chemotherapy 07/12/2017  . Goals of care, counseling/discussion 07/12/2017  . Moderate protein-calorie malnutrition ([place]) 07/12/2017  . Encounter for smoking cessation counseling 07/12/2017  . 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 Primary malignant neoplasm of left upper lobe of lung ([place]) 07/08/2017  . Malnutrition of moderate degree 07/02/2017  . CAP (community acquired pneumonia) 07/01/2017  . Lung mass 07/01/2017  . Atypical pneumonia   . Acute CVA (cerebrovascular accident) ([place]) 01/20/2014  . Exertional angina ([place]) 03/12/2012  . CAD (coronary artery disease) 02/10/2012  . Tobacco abuse 02/10/2012  . Essential hypertension 02/10/2012  . Benign neoplasm of colon 08/01/2007  . Type 2 diabetes mellitus with hypoglycemia without coma ([place]) 08/01/2007  . Hyperlipidemia 08/01/2007  . GASTROESOPHAGEAL REFLUX DISEASE 08/01/2007  . OSTEOARTHRITIS 08/01/2007    Past Surgical History:  Procedure Laterality Date  . APPENDECTOMY  ~ 1965  . CATARACT EXTRACTION    . CORONARY ANGIOPLASTY WITH STENT PLACEMENT  1999; 2004; 03/11/2012   "2 + 1 + 1; total of 4" (03/11/2012)  . FEMUR FRACTURE SURGERY  2011   RLE (03/11/2012)  . IR FLUORO GUIDE PORT INSERTION RIGHT  07/18/2017  . IR US GUIDE VASC ACCESS RIGHT  07/18/2017  .  PERCUTANEOUS CORONARY STENT INTERVENTION (PCI-S) N/A 03/11/2012   Procedure: PERCUTANEOUS CORONARY STENT INTERVENTION (PCI-S);  Surgeon: Sherren Mocha, MD;  Location: [facility] CATH LAB;  Service: Cardiovascular;  Laterality: N/A;  . SKIN CANCER EXCISION  2013   "nose & right middle finger; actinic keratosis" (03/11/2012)  . TONSILLECTOMY AND ADENOIDECTOMY  ~ 1962  . TUBAL LIGATION  1977  . VIDEO BRONCHOSCOPY WITH ENDOBRONCHIAL ULTRASOUND N/A 07/04/2017    Procedure: VIDEO BRONCHOSCOPY WITH ENDOBRONCHIAL ULTRASOUND;  Surgeon: Collene Gobble, MD;  Location: [facility] OR;  Service: Thoracic;  Laterality: N/A;     OB History   None      Home Medications    Prior to Admission medications   Medication Sig Start Date End Date Taking? Authorizing Provider  ALPRAZolam Duanne Moron) 0.5 MG tablet Take 1 tablet (0.5 mg total) by mouth at bedtime as needed for anxiety or sleep. Patient taking differently: Take 0.5 mg by mouth at bedtime.  07/08/17 07/08/18 Yes Emokpae, Courage, MD  amLODipine (NORVASC) 10 MG tablet Take 1 tablet (10 mg total) by mouth daily. 08/11/17  Yes Kayleen Memos, DO  aspirin 81 MG tablet Take 1 tablet (81 mg total) by mouth daily. 01/21/14  Yes Annita Brod, MD  atorvastatin (LIPITOR) 80 MG tablet Take 80 mg by mouth daily.   Yes [provider]  clopidogrel (PLAVIX) 75 MG tablet Take 75 mg by mouth daily.   Yes [provider]  fluticasone (FLONASE) 50 MCG/ACT nasal spray Place 1 spray into both nostrils daily. 08/09/17  Yes Ollis, Cleaster Corin, NP  guaiFENesin (MUCINEX) 600 MG 12 hr tablet Take 2 tablets (1,200 mg total) by mouth 2 (two) times daily as needed (Use for one week post discharge, then twice daily as needed). Patient taking differently: Take 1,200 mg by mouth 2 (two) times daily as needed for cough (Use for one week post discharge, then twice daily as needed).  08/09/17  Yes Donita Brooks, NP  HUMALOG KWIKPEN 100 UNIT/ML KiwkPen 0-8 units three times daily after meals 07/23/17  Yes [provider]  ipratropium-albuterol (DUONEB) 0.5-2.5 (3) MG/3ML SOLN Take 3 mLs by nebulization every 6 (six) hours as needed (Shortness of breath or wheezing). 08/09/17  Yes Ollis, Brandi L, NP  lactose free nutrition (BOOST PLUS) LIQD Take 237 mLs by mouth 3 (three) times daily between meals. 08/10/17  Yes Hall, Carole N, DO  loratadine (CLARITIN) 10 MG tablet Take 10 mg by mouth daily.   Yes [provider]  metFORMIN  (GLUCOPHAGE) 1000 MG tablet Take 1,000 mg by mouth 2 (two) times daily with a meal.   Yes [provider]  metoprolol succinate (TOPROL-XL) 50 MG 24 hr tablet Take 50 mg by mouth daily. Take with or immediately following a meal.   Yes [provider]  mirtazapine (REMERON SOL-TAB) 15 MG disintegrating tablet ONE BY MOUTH DAILY AT BEDTIME 08/10/17  Yes [provider]  sitaGLIPtin (JANUVIA) 100 MG tablet Take 0.5 tablets (50 mg total) by mouth daily. 08/10/17  Yes Hall, Carole N, DO  insulin glargine (LANTUS) 100 UNIT/ML injection Inject 0.08 mLs (8 Units total) into the skin at bedtime. Patient not taking: Reported on 08/14/2017 07/08/17   Roxan Hockey, MD  nitroGLYCERIN (NITROSTAT) 0.4 MG SL tablet Place 1 tablet (0.4 mg total) under the tongue every 5 (five) minutes as needed (up to 3 doses). For chest pain 03/12/12   Dunn, Lisbeth Renshaw N, PA-C  ondansetron (ZOFRAN ODT) 4 MG disintegrating tablet Take 1  tablet (4 mg total) by mouth every 8 (eight) hours as needed for nausea or vomiting. 07/08/17   Roxan Hockey, MD  prochlorperazine (COMPAZINE) 10 MG tablet Take 1 tablet (10 mg total) by mouth every 6 (six) hours as needed for nausea or vomiting. 07/22/17   Curt Bears, MD  sodium chloride (OCEAN) 0.65 % SOLN nasal spray Place 1 spray into both nostrils as needed for congestion (use twice daily for one week post discharge, then as needed). 08/09/17   Donita Brooks, NP    Family History Family History  Problem Relation Age of Onset  . Liver cancer Mother   . Brain cancer Father   . Stomach cancer Father   . Colon cancer Brother 18  . Lung cancer Sister   . Lung cancer Brother   . Stomach cancer Brother   . Diabetes Daughter     Social History Social History   Tobacco Use  . Smoking status: Current Every Day Smoker    Packs/day: 0.50    Years: 51.00    Pack years: 25.50    Types: Cigarettes  . Smokeless tobacco: Never Used  . Tobacco comment: once in a while  08-13-17  Substance Use Topics  . Alcohol use: No  . Drug use: No     Allergies   Codeine and Morphine and related   Review of Systems Review of Systems  Respiratory: Negative for shortness of breath.   Cardiovascular: Negative for chest pain.  Gastrointestinal: Negative for abdominal pain.  Neurological: Negative for headaches.  All other systems reviewed and are negative.    Physical Exam Updated Vital Signs BP (!) 146/75 (BP Location: Left Arm)   Pulse 80   Temp 98.9 F (37.2 C) (Oral)   Resp 16   SpO2 98%   Physical Exam  Constitutional: She is oriented to person, place, and time. She appears well-developed. No distress.  Chronically ill in appearance   HENT:  Head: Normocephalic and atraumatic.  Mouth/Throat: Oropharynx is clear and moist.  Eyes: Pupils are equal, round, and reactive to light. Conjunctivae and EOM are normal.  Neck: Normal range of motion. Neck supple.  Cardiovascular: Normal rate, regular rhythm and normal heart sounds.  Pulmonary/Chest: Effort normal and breath sounds normal. No respiratory distress.  Abdominal: Soft. She exhibits no distension. There is no tenderness.  Musculoskeletal: Normal range of motion. She exhibits no edema or deformity.  Neurological: She is alert and oriented to person, place, and time.  Skin: Skin is warm and dry.  Psychiatric: She has a normal mood and affect.  Nursing note and vitals reviewed.    ED Treatments / Results  Labs (all labs ordered are listed, but only abnormal results are displayed) Labs Reviewed  COMPREHENSIVE METABOLIC PANEL - Abnormal; Notable for the following components:      Result Value   Potassium 5.4 (*)    Glucose, Bld 280 (*)    BUN 30 (*)    Calcium 8.4 (*)    Albumin 2.9 (*)    All other components within normal limits  CBC WITH DIFFERENTIAL/PLATELET - Abnormal; Notable for the following components:   WBC 16.8 (*)    RBC 3.12 (*)    Hemoglobin 9.2 (*)    HCT 27.8 (*)    RDW  16.1 (*)    Platelets 835 (*)    Neutro Abs 13.0 (*)    Monocytes Absolute 1.1 (*)    All other components within normal limits  TROPONIN I  BRAIN NATRIURETIC PEPTIDE  URINALYSIS, ROUTINE W REFLEX MICROSCOPIC    EKG EKG Interpretation  Date/Time:  Thursday Aug 14 2017 21:39:43 EDT Ventricular Rate:  76 PR Interval:    QRS Duration: 86 QT Interval:  410 QTC Calculation: 461 R Axis:   75 Text Interpretation:  Sinus rhythm Consider left ventricular hypertrophy Confirmed by Dene Gentry [id number]) on 08/14/2017 11:52:40 PM   Radiology Dg Chest 2 View  Result Date: 08/14/2017 CLINICAL DATA:  77 year old female with weakness. EXAM: CHEST - 2 VIEW COMPARISON:  Chest radiograph dated 08/13/2017 FINDINGS: Right pectoral Port-A-Cath with tip at the cavoatrial junction. Diffuse interstitial coarsening and bronchitic changes. There is no focal consolidation, pleural effusion, or pneumothorax. The cardiac silhouette is within normal limits. Coronary stents noted. There is osteopenia with degenerative changes of the spine. No acute osseous pathology. IMPRESSION: No active cardiopulmonary disease. Electronically Signed   By: Anner Crete M.D.   On: 08/14/2017 21:33   Dg Chest 2 View  Result Date: 08/13/2017 CLINICAL DATA:  Patient hospitalized last week for pneumonia with persistent productive cough. History of left upper lobe lung cancer. Patient currently on chemotherapy. EXAM: CHEST - 2 VIEW COMPARISON:  08/07/2017 and chest CT 08/08/2017 FINDINGS: Right IJ Port-A-Cath unchanged. Lungs are adequately inflated with no significant change in patient's peripheral lateral left upper lobe lung cancer. No definite focal airspace consolidation. Mild prominence of the central perihilar markings which may indicate a mild degree of vascular congestion. No effusion. Cardiomediastinal silhouette and remainder of the exam is unchanged. IMPRESSION: No evidence of pneumonia. Suggestion mild vascular congestion.  Known stable lung cancer over the lateral left upper lobe. Electronically Signed   By: Marin Olp M.D.   On: 08/13/2017 19:18    Procedures Procedures (including critical care time)  Medications Ordered in ED Medications  lidocaine-prilocaine (EMLA) cream (1 application Topical Given 08/14/17 2141)     Initial Impression / Assessment and Plan / ED Course  I have reviewed the triage vital signs and the nursing notes.  Pertinent labs & imaging results that were available during my care of the patient were reviewed by me and considered in my medical decision making (see chart for details).     MDM  Screen complete   Patient is presenting for evaluation of continued weakness in the setting of a recent admission for pneumonia.  Patient's labs today suggest a possible mild amount of dehydration.  No other significant abnormalities found on screening labs performed this evening.  Patient will be admitted to the hospitalist service for further observation and possible work-up.  Hospitalist is aware of case and will evaluate for admission.    Final Clinical Impressions(s) / ED Diagnoses   Final diagnoses:  Weakness  Dehydration    ED Discharge Orders    None       Valarie Merino, MD 08/15/17 0001

## 2017-08-14 NOTE — ED Triage Notes (Signed)
Pt complains of bilateral feet swelling since this am, was seen at [facility] last night for a cough  Pt was in the hospital last week for several days with pneumonia

## 2017-08-14 NOTE — Telephone Encounter (Signed)
First avail appt in [facility] or [facility] for any provider is not until June.

## 2017-08-15 ENCOUNTER — Other Ambulatory Visit (HOSPITAL_COMMUNITY): Payer: Medicare Other

## 2017-08-15 ENCOUNTER — Other Ambulatory Visit: Payer: Self-pay

## 2017-08-15 ENCOUNTER — Ambulatory Visit: Payer: Medicare Other

## 2017-08-15 ENCOUNTER — Telehealth: Payer: Self-pay | Admitting: *Deleted

## 2017-08-15 DIAGNOSIS — E860 Dehydration: Secondary | ICD-10-CM | POA: Diagnosis not present

## 2017-08-15 DIAGNOSIS — E11649 Type 2 diabetes mellitus with hypoglycemia without coma: Secondary | ICD-10-CM | POA: Diagnosis not present

## 2017-08-15 DIAGNOSIS — R531 Weakness: Secondary | ICD-10-CM

## 2017-08-15 LAB — CBC
HCT: 28.6 % — ABNORMAL LOW (ref 36.0–46.0)
Hemoglobin: 9.3 g/dL — ABNORMAL LOW (ref 12.0–15.0)
MCH: 29.3 pg (ref 26.0–34.0)
MCHC: 32.5 g/dL (ref 30.0–36.0)
MCV: 90.2 fL (ref 78.0–100.0)
Platelets: 831 K/uL — ABNORMAL HIGH (ref 150–400)
RBC: 3.17 MIL/uL — ABNORMAL LOW (ref 3.87–5.11)
RDW: 16 % — ABNORMAL HIGH (ref 11.5–15.5)
WBC: 11.7 10*3/uL — ABNORMAL HIGH (ref 4.0–10.5)

## 2017-08-15 LAB — URINALYSIS, ROUTINE W REFLEX MICROSCOPIC
BILIRUBIN URINE: NEGATIVE
Glucose, UA: NEGATIVE mg/dL
HGB URINE DIPSTICK: NEGATIVE
KETONES UR: NEGATIVE mg/dL
LEUKOCYTES UA: NEGATIVE
Nitrite: NEGATIVE
PROTEIN: 100 mg/dL — AB
Specific Gravity, Urine: 1.02 (ref 1.005–1.030)
pH: 5 (ref 5.0–8.0)

## 2017-08-15 LAB — BASIC METABOLIC PANEL
Anion gap: 10 (ref 5–15)
Calcium: 8.9 mg/dL (ref 8.9–10.3)
Chloride: 103 mmol/L (ref 101–111)
GFR calc Af Amer: >60 mL/min (ref >60)
GFR calc non Af Amer: >60 mL/min (ref >60)
Potassium: 4.1 mmol/L (ref 3.5–5.1)

## 2017-08-15 LAB — GLUCOSE, CAPILLARY
Glucose-Capillary: 186 mg/dL — ABNORMAL HIGH (ref 65–99)
Glucose-Capillary: 140 mg/dL — ABNORMAL HIGH (ref 65–99)
Glucose-Capillary: 181 mg/dL — ABNORMAL HIGH (ref 65–99)
Glucose-Capillary: 250 mg/dL — ABNORMAL HIGH (ref 65–99)

## 2017-08-15 LAB — PROCALCITONIN
Procalcitonin: <0.1 ng/mL
Procalcitonin: <0.1 ng/mL

## 2017-08-15 LAB — BASIC METABOLIC PANEL WITH GFR
BUN: 25 mg/dL — ABNORMAL HIGH (ref 6–20)
CO2: 24 mmol/L (ref 22–32)
Creatinine, Ser: 0.79 mg/dL (ref 0.44–1.00)
Glucose, Bld: 229 mg/dL — ABNORMAL HIGH (ref 65–99)
Sodium: 137 mmol/L (ref 135–145)

## 2017-08-15 LAB — BRAIN NATRIURETIC PEPTIDE: B Natriuretic Peptide: 106.6 pg/mL — ABNORMAL HIGH (ref 0.0–100.0)

## 2017-08-15 LAB — CBG MONITORING, ED: Glucose-Capillary: 191 mg/dL — ABNORMAL HIGH (ref 65–99)

## 2017-08-15 MED ORDER — ACETAMINOPHEN 650 MG RE SUPP: 650.0000 mg | Freq: Four times a day (QID) | RECTAL | Status: DC | PRN

## 2017-08-15 MED ORDER — IPRATROPIUM-ALBUTEROL 0.5-2.5 (3) MG/3ML IN SOLN: 3.0000 mL | Freq: Four times a day (QID) | RESPIRATORY_TRACT | Status: DC | PRN

## 2017-08-15 MED ORDER — TRAMADOL HCL 50 MG PO TABS: 50.0000 mg | ORAL_TABLET | Freq: Four times a day (QID) | ORAL | Status: DC | PRN

## 2017-08-15 MED ORDER — LORATADINE 10 MG PO TABS
10.0000 mg | ORAL_TABLET | Freq: Every day | ORAL | Status: DC
  Administered 2017-08-15 – 2017-08-16 (×2): 10 mg via ORAL
  Filled 2017-08-15 (×2): qty 1

## 2017-08-15 MED ORDER — ATORVASTATIN CALCIUM 40 MG PO TABS
80.0000 mg | ORAL_TABLET | Freq: Every day | ORAL | Status: DC
  Administered 2017-08-15 – 2017-08-16 (×2): 80 mg via ORAL
  Filled 2017-08-15 (×2): qty 2

## 2017-08-15 MED ORDER — FLUTICASONE PROPIONATE 50 MCG/ACT NA SUSP
1.0000 | Freq: Every day | NASAL | Status: DC
  Filled 2017-08-15: qty 16

## 2017-08-15 MED ORDER — METOPROLOL SUCCINATE ER 50 MG PO TB24
50.0000 mg | ORAL_TABLET | Freq: Every day | ORAL | Status: DC
  Administered 2017-08-15 – 2017-08-16 (×2): 50 mg via ORAL
  Filled 2017-08-15 (×2): qty 1

## 2017-08-15 MED ORDER — ENOXAPARIN SODIUM 30 MG/0.3ML ~~LOC~~ SOLN
30.0000 mg | SUBCUTANEOUS | Status: DC
  Administered 2017-08-15 – 2017-08-16 (×2): 30 mg via SUBCUTANEOUS
  Filled 2017-08-15 (×2): qty 0.3

## 2017-08-15 MED ORDER — ONDANSETRON 4 MG PO TBDP: 4.0000 mg | ORAL_TABLET | Freq: Three times a day (TID) | ORAL | Status: DC | PRN

## 2017-08-15 MED ORDER — INSULIN ASPART 100 UNIT/ML ~~LOC~~ SOLN
0.0000 [IU] | Freq: Three times a day (TID) | SUBCUTANEOUS | Status: DC
  Administered 2017-08-15 (×2): 2 [IU] via SUBCUTANEOUS
  Administered 2017-08-15 – 2017-08-16 (×2): 1 [IU] via SUBCUTANEOUS

## 2017-08-15 MED ORDER — PROCHLORPERAZINE MALEATE 10 MG PO TABS
10.0000 mg | ORAL_TABLET | Freq: Four times a day (QID) | ORAL | Status: DC | PRN
  Filled 2017-08-15: qty 1

## 2017-08-15 MED ORDER — SODIUM CHLORIDE 0.9 % IV SOLN
INTRAVENOUS | Status: AC
  Administered 2017-08-15 (×2): via INTRAVENOUS

## 2017-08-15 MED ORDER — ALPRAZOLAM 0.5 MG PO TABS
0.5000 mg | ORAL_TABLET | Freq: Every day | ORAL | Status: DC
  Administered 2017-08-15: 0.5 mg via ORAL
  Filled 2017-08-15: qty 1

## 2017-08-15 MED ORDER — CLOPIDOGREL BISULFATE 75 MG PO TABS
75.0000 mg | ORAL_TABLET | Freq: Every day | ORAL | Status: DC
  Administered 2017-08-15 – 2017-08-16 (×2): 75 mg via ORAL
  Filled 2017-08-15 (×2): qty 1

## 2017-08-15 MED ORDER — ACETAMINOPHEN 325 MG PO TABS: 650.0000 mg | ORAL_TABLET | Freq: Four times a day (QID) | ORAL | Status: DC | PRN

## 2017-08-15 MED ORDER — NITROGLYCERIN 0.4 MG SL SUBL: 0.4000 mg | SUBLINGUAL_TABLET | SUBLINGUAL | Status: DC | PRN

## 2017-08-15 MED ORDER — MIRTAZAPINE 15 MG PO TBDP
15.0000 mg | ORAL_TABLET | Freq: Every day | ORAL | Status: DC
  Administered 2017-08-15: 15 mg via ORAL
  Filled 2017-08-15: qty 1

## 2017-08-15 MED ORDER — ASPIRIN EC 81 MG PO TBEC
81.0000 mg | DELAYED_RELEASE_TABLET | Freq: Every day | ORAL | Status: DC
  Administered 2017-08-15 – 2017-08-16 (×2): 81 mg via ORAL
  Filled 2017-08-15 (×2): qty 1

## 2017-08-15 MED ORDER — SODIUM CHLORIDE 0.9% FLUSH: 10.0000 mL | INTRAVENOUS | Status: DC | PRN

## 2017-08-15 MED ORDER — SALINE SPRAY 0.65 % NA SOLN
1.0000 | NASAL | Status: DC | PRN
  Filled 2017-08-15: qty 44

## 2017-08-15 MED ORDER — GUAIFENESIN ER 600 MG PO TB12: 1200.0000 mg | ORAL_TABLET | Freq: Two times a day (BID) | ORAL | Status: DC | PRN

## 2017-08-15 MED ORDER — POLYETHYLENE GLYCOL 3350 17 G PO PACK
17.0000 g | PACK | Freq: Every day | ORAL | Status: DC | PRN
  Filled 2017-08-15: qty 1

## 2017-08-15 MED ORDER — AMLODIPINE BESYLATE 10 MG PO TABS
10.0000 mg | ORAL_TABLET | Freq: Every day | ORAL | Status: DC
  Administered 2017-08-15 – 2017-08-16 (×2): 10 mg via ORAL
  Filled 2017-08-15 (×2): qty 1

## 2017-08-15 MED ORDER — BOOST PLUS PO LIQD
237.0000 mL | Freq: Three times a day (TID) | ORAL | Status: DC
  Administered 2017-08-15 – 2017-08-16 (×3): 237 mL via ORAL
  Filled 2017-08-15 (×6): qty 237

## 2017-08-15 NOTE — ED Notes (Signed)
Called report again nurse wants to call back in 10 mins

## 2017-08-15 NOTE — ED Notes (Signed)
ED TO INPATIENT HANDOFF REPORT  Name/Age/Gender Katherine Cole 77 y.o. female  Code Status Code Status History    Date Active Date Inactive Code Status Order ID Comments User Context   08/03/2017 1723 08/10/2017 1837 DNR [id number]  Samella Parr, NP Inpatient   08/03/2017 0506 08/03/2017 1723 Full Code [id number]  Norval Morton, MD ED   07/01/2017 0039 07/08/2017 2041 Full Code [id number]  Quintella Baton, MD ED   01/20/2014 1755 01/22/2014 1348 Full Code [id number]  Charlynne Cousins, MD Inpatient    Questions for Most Recent Historical Code Status (Order [id number])    Question Answer Comment   In the event of cardiac or respiratory ARREST Do not call a "code blue"    In the event of cardiac or respiratory ARREST Do not perform Intubation, CPR, defibrillation or ACLS    In the event of cardiac or respiratory ARREST Use medication by any route, position, wound care, and other measures to relive pain and suffering. May use oxygen, suction and manual treatment of airway obstruction as needed for comfort.       Home/SNF/Other Home  Chief Complaint Ca pt ( Chemo Alert Card), feet swelling, feeling raspy  Level of Care/Admitting Diagnosis ED Disposition    ED Disposition Condition Comment   Admit  Hospital Area: [facility] [[id number]]  Level of Care: Med-Surg [16]  Diagnosis: Dehydration [276.51.ICD-9-CM]  Admitting Physician: Cristy Folks [[id number]]  Attending Physician: Cristy Folks [[id number]]  PT Class (Do Not Modify): Observation [104]  PT Acc Code (Do Not Modify): Observation [10022]       Medical History Past Medical History:  Diagnosis Date  . Arthritis    "in my fingers"   . CAD (coronary artery disease)    a. MI 1999 with 2 stents at that time followed by stenting 3-4 yrs later. b. s/p DES to LAD guided by pressure wire 03/11/12.  . Colon polyps    adenomatous  . Depression   . Diabetes mellitus, type 2 ([place])   . Hyperlipemia   .  Hypertension   . Osteoarthritis   . Pneumonia 06/2017  . Skin cancer 2012   "nose; right middle finger"  . Stroke [facility])     Allergies Allergies  Allergen Reactions  . Codeine Other (See Comments)    "gives me a migraine" (03/11/2012)  . Morphine And Related Other (See Comments)    "gives me a migraine" (03/11/2012)    IV Location/Drains/Wounds Patient Lines/Drains/Airways Status   Active Line/Drains/Airways    Name:   Placement date:   Placement time:   Site:   Days:   Implanted Port 07/22/17   07/22/17    1122    -   24   Incision (Closed) 07/18/17 Chest Upper;Right   07/18/17    1018     28          Labs/Imaging Results for orders placed or performed during the hospital encounter of 08/14/17 (from the past 48 hour(s))  Troponin I     Status: None   Collection Time: 08/14/17  9:02 PM  Result Value Ref Range   Troponin I <0.03 <0.03 ng/mL    Comment: Performed at [facility], [place] [address] [place], [place] [address]  Comprehensive metabolic panel     Status: Abnormal   Collection Time: 08/14/17  9:02 PM  Result Value Ref Range   Sodium 138 135 - 145 mmol/L   Potassium 5.4 (H) 3.5 -  5.1 mmol/L   Chloride 104 101 - 111 mmol/L   CO2 26 22 - 32 mmol/L   Glucose, Bld 280 (H) 65 - 99 mg/dL   BUN 30 (H) 6 - 20 mg/dL   Creatinine, Ser 0.91 0.44 - 1.00 mg/dL   Calcium 8.4 (L) 8.9 - 10.3 mg/dL   Total Protein 6.6 6.5 - 8.1 g/dL   Albumin 2.9 (L) 3.5 - 5.0 g/dL   AST 21 15 - 41 U/L   ALT 22 14 - 54 U/L   Alkaline Phosphatase 110 38 - 126 U/L   Total Bilirubin 0.3 0.3 - 1.2 mg/dL   GFR calc non Af Amer >60 >60 mL/min   GFR calc Af Amer >60 >60 mL/min    Comment: (NOTE) The eGFR has been calculated using the CKD EPI equation. This calculation has not been validated in all clinical situations. eGFR's persistently <60 mL/min signify possible Chronic Kidney Disease.    Anion gap 8 5 - 15    Comment: Performed at [facility], [place]  [address], [place], [place] [address]  CBC with Differential     Status: Abnormal   Collection Time: 08/14/17  9:02 PM  Result Value Ref Range   WBC 16.8 (H) 4.0 - 10.5 K/uL   RBC 3.12 (L) 3.87 - 5.11 MIL/uL   Hemoglobin 9.2 (L) 12.0 - 15.0 g/dL   HCT 27.8 (L) 36.0 - 46.0 %   MCV 89.1 78.0 - 100.0 fL   MCH 29.5 26.0 - 34.0 pg   MCHC 33.1 30.0 - 36.0 g/dL   RDW 16.1 (H) 11.5 - 15.5 %   Platelets 835 (H) 150 - 400 K/uL   Neutrophils Relative % 77 %   Neutro Abs 13.0 (H) 1.7 - 7.7 K/uL   Lymphocytes Relative 16 %   Lymphs Abs 2.7 0.7 - 4.0 K/uL   Monocytes Relative 7 %   Monocytes Absolute 1.1 (H) 0.1 - 1.0 K/uL   Eosinophils Relative 0 %   Eosinophils Absolute 0.0 0.0 - 0.7 K/uL   Basophils Relative 0 %   Basophils Absolute 0.1 0.0 - 0.1 K/uL    Comment: Performed at [facility], [place] [address], [place], [place] [address]  Brain natriuretic peptide     Status: Abnormal   Collection Time: 08/14/17  9:02 PM  Result Value Ref Range   B Natriuretic Peptide 106.6 (H) 0.0 - 100.0 pg/mL    Comment: Performed at [facility], [place] [address] [place], [place] [address]   Dg Chest 2 View  Result Date: 08/14/2017 CLINICAL DATA:  77 year old female with weakness. EXAM: CHEST - 2 VIEW COMPARISON:  Chest radiograph dated 08/13/2017 FINDINGS: Right pectoral Port-A-Cath with tip at the cavoatrial junction. Diffuse interstitial coarsening and bronchitic changes. There is no focal consolidation, pleural effusion, or pneumothorax. The cardiac silhouette is within normal limits. Coronary stents noted. There is osteopenia with degenerative changes of the spine. No acute osseous pathology. IMPRESSION: No active cardiopulmonary disease. Electronically Signed   By: Anner Crete M.D.   On: 08/14/2017 21:33   Dg Chest 2 View  Result Date: 08/13/2017 CLINICAL DATA:  Patient hospitalized last week for pneumonia with persistent productive cough. History of left upper lobe  lung cancer. Patient currently on chemotherapy. EXAM: CHEST - 2 VIEW COMPARISON:  08/07/2017 and chest CT 08/08/2017 FINDINGS: Right IJ Port-A-Cath unchanged. Lungs are adequately inflated with no significant change in patient's peripheral lateral left upper lobe lung cancer. No definite  focal airspace consolidation. Mild prominence of the central perihilar markings which may indicate a mild degree of vascular congestion. No effusion. Cardiomediastinal silhouette and remainder of the exam is unchanged. IMPRESSION: No evidence of pneumonia. Suggestion mild vascular congestion. Known stable lung cancer over the lateral left upper lobe. Electronically Signed   By: Marin Olp M.D.   On: 08/13/2017 19:18    Pending Labs Unresulted Labs (From admission, onward)   Start     Ordered   08/15/17 0500  Procalcitonin  Daily,   R     08/15/17 0000   08/15/17 0001  Procalcitonin - Baseline  STAT,   STAT     08/15/17 0000   08/14/17 2332  Urinalysis, Routine w reflex microscopic  STAT,   STAT     08/14/17 2331   Signed and Held  Basic metabolic panel  Tomorrow morning,   R     Signed and Held   Signed and Held  CBC  Tomorrow morning,   R     Signed and Held      Vitals/Pain Today's Vitals   08/14/17 2300 08/14/17 2313 08/15/17 0000 08/15/17 0115  BP: (!) 117/49 (!) 117/49 132/73 (!) 141/75  Pulse: 69 68 68 62  Resp: (!) '28 15 17 20  '$ Temp:    98.9 F (37.2 C)  TempSrc:    Oral  SpO2: 95% 95% 96% 96%  PainSc:    Asleep    Isolation Precautions No active isolations  Medications Medications  lidocaine-prilocaine (EMLA) cream (1 application Topical Given 08/14/17 2141)  0.9 %  sodium chloride infusion ( Intravenous New Bag/Given 08/14/17 2349)    Mobility walks with device

## 2017-08-15 NOTE — Telephone Encounter (Signed)
Please schedule her to see me or Cyril Mourning next week after discharge from the hospital.

## 2017-08-15 NOTE — ED Notes (Signed)
Family member came out room said her mom wanted her blood sugar taken was very concerned.

## 2017-08-15 NOTE — Progress Notes (Signed)
Initial Nutrition Assessment  DOCUMENTATION CODES:   (Will assess for malnutrition at follow-up)  INTERVENTION:  - Continue Boost Plus TID, each supplement provides 360 kcal and 14 grams of protein. - Continue to encourage PO intakes.  - RD will monitor for additional nutrition-related needs.   NUTRITION DIAGNOSIS:   Increased nutrient needs related to catabolic illness, cancer and cancer related treatments as evidenced by estimated needs.  GOAL:   Patient will meet greater than or equal to 90% of their needs  MONITOR:   PO intake, Supplement acceptance, Weight trends, Labs  REASON FOR ASSESSMENT:   Consult Other (Comment)(FTT)  ASSESSMENT:   77 y.o. 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 She presented to to the ED with bilateral lower extremity edema.  Patient was recently hospitalized from 08/02/2017 to 08/10/2016 with rhinovirus pneumonitis and was treated empirically for sepsis. She does continue to have a severe cough for which she is taking a bronchodilator.   BMI indicates underweight status. No intakes documented since admission. Lunch tray at bedside and pt had consumed 50-75% of the meal. Pt was sleeping during the entirety of visit, but her daughter was at bedside and provided all information. Pt was seen by [facility] RD on 07/17/17. Daughter recalls the visit and states that pt has been doing very well since that time. She has been drinking 3 Boost Plus/day and eats small portions/meals frequently throughout the day. She opts for foods that are high fat, high protein to maximize bites as pt gets full quickly. When pt wants to go out to eat her family takes her wherever she would like to go. They do not worry about carbs in food and simply cover as  needed as to not restrict pt when her appetite is good and she is interested in items.   Per chart review, weight has been stable since 07/06/17. Will continue to monitor. Unable to state malnutrition without performing NFPE. Daughter denies any nutrition-related questions or concerns at this time other than pt not receiving any Boost since admission. RD communicated with RN who provided pt with a bottle a short time later.   Medications reviewed; sliding scale Novolog. Labs reviewed; CBGs: 191 and 186 mg/dL today, BUN: 25 mg/dL.  IVF: NS @ 75 mL/hr started at 3:45 AM today and to run for 1 day.      NUTRITION - FOCUSED PHYSICAL EXAM:  Unable to complete as pt sleeping at the time of visit; will attempt at follow-up.   Diet Order:   Diet Order           Diet Carb Modified Fluid consistency: Thin; Room service appropriate? Yes  Diet effective now          EDUCATION NEEDS:   No education needs have been identified at this time  Skin:  Skin Assessment: Reviewed RN Assessment  Last BM:  5/9  Height:   Ht Readings from Last 1 Encounters:  08/13/17 5\' 1"  (1.549 m)    Weight:   Wt Readings from Last 1 Encounters:  08/13/17 83 lb (37.6 kg)    Ideal Body Weight:  47.73 kg  BMI:  There is no height or weight on file to calculate BMI.  Estimated Nutritional Needs:   Kcal:  7564-3329 (40-45 kcal/kg)  Protein:  65-75 grams (1.7-2 grams/kg)  Fluid:  >/= 1.5 L/day     Jarome Matin, MS, RD, LDN, [facility] Inpatient Clinical Dietitian Pager # [phone] After hours/weekend pager # [phone]

## 2017-08-15 NOTE — Telephone Encounter (Signed)
Call from pt's daughter reporting she has been admitted to the hospital. Pt is scheduled for chemo on 5/13, daughter reports she is frail and weak. She would like to discuss plan with Dr. Julien Nordmann.  Message to MD for review. Pt is not scheduled for office visit prior to chemo.

## 2017-08-15 NOTE — Progress Notes (Signed)
Lovenox per Pharmacy for DVT Prophylaxis    Pharmacy has been consulted from dosing enoxaparin (lovenox) in this patient for DVT prophylaxis.  The pharmacist has reviewed pertinent labs (Hgb _9.2__; PLT__835_), patient weight (_37.6__kg) and renal function (CrCl_32.2__mL/min) and decided that enoxaparin _30_mg SQ Q24Hrs is appropriate for this patient.  The pharmacy department will sign off at this time.  Please reconsult pharmacy if status changes or for further issues.  Thank you  Cyndia Diver PharmD, BCPS  08/15/2017, 3:53 AM

## 2017-08-15 NOTE — Progress Notes (Signed)
Patient previously active with [facility] for nursing, will need new orders to resume care at d/c. Will continue to follow for d/c needs. [phone]

## 2017-08-15 NOTE — ED Notes (Signed)
Called floor for report asked to call back in 20 mins.

## 2017-08-15 NOTE — Progress Notes (Signed)
Katherine Cole is a 77 y.o. 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 Lower extremity edema. Currently there is no edema in the legs.  Se was found to be dehydrated and admitted for failure to thrive.   On exam  She is alert and comfortable.  Lungs clear  CVS s1s2, no murmer abd soft non tender  Non distended bowel sounds good.  Extremities, cachetic looking, but no pedal edema.    Plan:  Dietary consult.  Gentle hydration PT evaluation.  Repeat counts in am.    Hosie Poisson , MD [phone]

## 2017-08-15 NOTE — H&P (Signed)
History and Physical    DALIAH CHAUDOIN ONG:[id number] DOB: 30-Mar-1941 DOA: 08/14/2017  PCP: Prince Solian, MD   Patient coming from: home   Chief Complaint: LE swelling  HPI: Katherine Cole is a 77 y.o. 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  Patient was recently hospitalized from 08/02/2017 to 08/10/2016 with rhinovirus pneumonitis and was treated empirically for sepsis.  During that hospitalization she required blood transfusion support she had recently completed her first course of chemotherapy.  Since then she was discharged and was doing somewhat better at home.  She was seen by her PCP on 08/13/2017 who noted some adventitious lung sounds patient to the emergency department.  During that visit her labs were fairly stable and her chest x-ray was unremarkable she was discharged home.  She presents again because this morning she noted trace bilateral edema over her feet.  She denies any orthopnea, chest pain, nausea, vomiting, diarrhea, proximal nocturnal dyspnea, syncope/presyncope, fevers.  She does continue to have a severe cough for which she is taking a bronchodilator.  Her daughter called the cancer center office told the patient presented to the emergency department.   ED Course:  In the ED she was noted to have mildly elevated BUN and potassium of 5.4.  Albumin was 2.9.  Her troponin was negative.  CBC showed a white count of 16.8.  Hemoglobin of 9.2 and platelets of 835.  Of note patient received Neupogen support after her first round of chemotherapy.   Review of Systems: As per HPI otherwise 10 point review of systems negative.   Past Medical  History:  Diagnosis Date  . Arthritis    "in my fingers"   . CAD (coronary artery disease)    a. MI 1999 with 2 stents at that time followed by stenting 3-4 yrs later. b. s/p DES to LAD guided by pressure wire 03/11/12.  . Colon polyps    adenomatous  . Depression   . Diabetes mellitus, type 2 ([place])   . Hyperlipemia   . Hypertension   . Osteoarthritis   . Pneumonia 06/2017  . Skin cancer 2012   "nose; right middle finger"  . Stroke [facility])     Past Surgical History:  Procedure Laterality Date  . APPENDECTOMY  ~ 1965  . CATARACT EXTRACTION    . CORONARY ANGIOPLASTY WITH STENT PLACEMENT  1999; 2004; 03/11/2012   "2 + 1 + 1; total of 4" (03/11/2012)  . FEMUR FRACTURE SURGERY  2011   RLE (03/11/2012)  . IR FLUORO GUIDE PORT INSERTION RIGHT  07/18/2017  . IR US GUIDE VASC ACCESS RIGHT  07/18/2017  . PERCUTANEOUS CORONARY STENT INTERVENTION (PCI-S) N/A 03/11/2012   Procedure: PERCUTANEOUS CORONARY STENT INTERVENTION (PCI-S);  Surgeon: Sherren Mocha, MD;  Location: [facility];  Service: Cardiovascular;  Laterality: N/A;  . SKIN CANCER EXCISION  2013   "nose & right middle finger; actinic keratosis" (03/11/2012)  . TONSILLECTOMY AND ADENOIDECTOMY  ~ 1962  . TUBAL LIGATION  1977  . VIDEO BRONCHOSCOPY WITH ENDOBRONCHIAL ULTRASOUND N/A 07/04/2017   Procedure: VIDEO BRONCHOSCOPY WITH ENDOBRONCHIAL ULTRASOUND;  Surgeon: Collene Gobble, MD;  Location: [facility];  Service: Thoracic;  Laterality: N/A;  reports that she has been smoking cigarettes.  She has a 25.50 pack-year smoking history. She has never used smokeless tobacco. She reports that she does not drink alcohol or use drugs.  Allergies  Allergen Reactions  . Codeine Other (See Comments)    "gives me a migraine" (03/11/2012)  . Morphine And Related Other (See Comments)    "gives me a migraine" (03/11/2012)    Family History  Problem Relation Age of Onset  . Liver cancer Mother   . Brain cancer Father   . Stomach cancer Father   .  Colon cancer Brother 92  . Lung cancer Sister   . Lung cancer Brother   . Stomach cancer Brother   . Diabetes Daughter     Prior to Admission medications   Medication Sig Start Date End Date Taking? Authorizing Provider  ALPRAZolam Duanne Moron) 0.5 MG tablet Take 1 tablet (0.5 mg total) by mouth at bedtime as needed for anxiety or sleep. Patient taking differently: Take 0.5 mg by mouth at bedtime.  07/08/17 07/08/18 Yes Emokpae, Courage, MD  amLODipine (NORVASC) 10 MG tablet Take 1 tablet (10 mg total) by mouth daily. 08/11/17  Yes Kayleen Memos, DO  aspirin 81 MG tablet Take 1 tablet (81 mg total) by mouth daily. 01/21/14  Yes Annita Brod, MD  atorvastatin (LIPITOR) 80 MG tablet Take 80 mg by mouth daily.   Yes [provider]  clopidogrel (PLAVIX) 75 MG tablet Take 75 mg by mouth daily.   Yes [provider]  fluticasone (FLONASE) 50 MCG/ACT nasal spray Place 1 spray into both nostrils daily. 08/09/17  Yes Ollis, Cleaster Corin, NP  guaiFENesin (MUCINEX) 600 MG 12 hr tablet Take 2 tablets (1,200 mg total) by mouth 2 (two) times daily as needed (Use for one week post discharge, then twice daily as needed). Patient taking differently: Take 1,200 mg by mouth 2 (two) times daily as needed for cough (Use for one week post discharge, then twice daily as needed).  08/09/17  Yes Donita Brooks, NP  HUMALOG KWIKPEN 100 UNIT/ML KiwkPen 0-8 units three times daily after meals 07/23/17  Yes [provider]  ipratropium-albuterol (DUONEB) 0.5-2.5 (3) MG/3ML SOLN Take 3 mLs by nebulization every 6 (six) hours as needed (Shortness of breath or wheezing). 08/09/17  Yes Ollis, Brandi L, NP  lactose free nutrition (BOOST PLUS) LIQD Take 237 mLs by mouth 3 (three) times daily between meals. 08/10/17  Yes Hall, Carole N, DO  loratadine (CLARITIN) 10 MG tablet Take 10 mg by mouth daily.   Yes [provider]  metFORMIN (GLUCOPHAGE) 1000 MG tablet Take 1,000 mg by mouth 2 (two) times daily with  a meal.   Yes [provider]  metoprolol succinate (TOPROL-XL) 50 MG 24 hr tablet Take 50 mg by mouth daily. Take with or immediately following a meal.   Yes [provider]  mirtazapine (REMERON SOL-TAB) 15 MG disintegrating tablet ONE BY MOUTH DAILY AT BEDTIME 08/10/17  Yes [provider]  sitaGLIPtin (JANUVIA) 100 MG tablet Take 0.5 tablets (50 mg total) by mouth daily. 08/10/17  Yes Hall, Carole N, DO  insulin glargine (LANTUS) 100 UNIT/ML injection Inject 0.08 mLs (8 Units total) into the skin at bedtime. Patient not taking: Reported on 08/14/2017 07/08/17   Roxan Hockey, MD  nitroGLYCERIN (NITROSTAT) 0.4 MG SL tablet Place 1 tablet (0.4 mg total) under the tongue every 5 (five) minutes as needed (up to 3 doses). For chest pain 03/12/12  Dunn, Dayna N, PA-C  ondansetron (ZOFRAN ODT) 4 MG disintegrating tablet Take 1 tablet (4 mg total) by mouth every 8 (eight) hours as needed for nausea or vomiting. 07/08/17   Roxan Hockey, MD  prochlorperazine (COMPAZINE) 10 MG tablet Take 1 tablet (10 mg total) by mouth every 6 (six) hours as needed for nausea or vomiting. 07/22/17   Curt Bears, MD  sodium chloride (OCEAN) 0.65 % SOLN nasal spray Place 1 spray into both nostrils as needed for congestion (use twice daily for one week post discharge, then as needed). 08/09/17   Donita Brooks, NP    Physical Exam: Vitals:   08/14/17 2011 08/14/17 2300 08/14/17 2313 08/15/17 0000  BP: (!) 146/75 (!) 117/49 (!) 117/49 132/73  Pulse: 80 69 68 68  Resp: 16 (!) 28 15 17   Temp: 98.9 F (37.2 C)     TempSrc: Oral     SpO2: 98% 95% 95% 96%    Constitutional: NAD, calm, comfortable Vitals:   08/14/17 2011 08/14/17 2300 08/14/17 2313 08/15/17 0000  BP: (!) 146/75 (!) 117/49 (!) 117/49 132/73  Pulse: 80 69 68 68  Resp: 16 (!) 28 15 17   Temp: 98.9 F (37.2 C)     TempSrc: Oral     SpO2: 98% 95% 95% 96%   Eyes: Anicteric sclera ENMT: Mucous membranes, good dentition Neck:  normal, supple Respiratory: Scattered rhonchi worse on the left, no wheezes or crackles, no increased work of breathing Cardiovascular: Regular rate and rhythm, no murmurs Abdomen: Soft, nontender, plus bowel sounds, no rebound or guarding Musculoskeletal: Trace pretibial edema Skin: Port site is clean dry intact Neurologic: Grossly intact, moving all extremities Psychiatric: Normal judgment and insight. Alert and oriented x 3. Normal mood.   (Anything < 9 systems with 2 bullets each down codes to level 1) (If patient refuses exam can't bill higher level) (Make sure to document decubitus ulcers present on admission -- if possible -- and whether patient has chronic indwelling catheter at time of admission)  Labs on Admission: I have personally reviewed following labs and imaging studies  CBC: Recent Labs  Lab 08/09/17 0458 08/11/17 1220 08/13/17 1759 08/14/17 2102  WBC 16.6* 16.6* 13.9* 16.8*  NEUTROABS  --  13.2* 9.9* 13.0*  HGB 8.6* 10.6* 10.5* 9.2*  HCT 26.4* 31.6* 32.0* 27.8*  MCV 88.3 88.8 89.9 89.1  PLT 159 464* 787* 859*   Basic Metabolic Panel: Recent Labs  Lab 08/09/17 0458 08/11/17 1220 08/13/17 1759 08/14/17 2102  NA 138 137 139 138  K 3.6 3.9 4.2 5.4*  CL 102 102 105 104  CO2 27 28 26 26   GLUCOSE 174* 117 210* 280*  BUN 9 17 25* 30*  CREATININE 0.71 0.76 0.83 0.91  CALCIUM 8.4* 9.6 9.1 8.4*   GFR: Estimated Creatinine Clearance: 32.2 mL/min (by C-G formula based on SCr of 0.91 mg/dL). Liver Function Tests: Recent Labs  Lab 08/11/17 1220 08/13/17 1759 08/14/17 2102  AST 24 20 21   ALT 32 24 22  ALKPHOS 146 127* 110  BILITOT 0.3 0.6 0.3  PROT 7.2 6.7 6.6  ALBUMIN 3.0* 2.9* 2.9*   No results for input(s): LIPASE, AMYLASE in the last 168 hours. No results for input(s): AMMONIA in the last 168 hours. Coagulation Profile: No results for input(s): INR, PROTIME in the last 168 hours. Cardiac Enzymes: Recent Labs  Lab 08/14/17 2102  TROPONINI <0.03    BNP (last 3 results) No results for input(s): PROBNP in the last 8760 hours.  HbA1C: No results for input(s): HGBA1C in the last 72 hours. CBG: Recent Labs  Lab 08/09/17 2001 08/09/17 2353 08/10/17 0407 08/10/17 0733 08/10/17 1144  GLUCAP 173* 220* 118* 144* 180*   Lipid Profile: No results for input(s): CHOL, HDL, LDLCALC, TRIG, CHOLHDL, LDLDIRECT in the last 72 hours. Thyroid Function Tests: No results for input(s): TSH, T4TOTAL, FREET4, T3FREE, THYROIDAB in the last 72 hours. Anemia Panel: No results for input(s): VITAMINB12, FOLATE, FERRITIN, TIBC, IRON, RETICCTPCT in the last 72 hours. Urine analysis:    Component Value Date/Time   COLORURINE YELLOW 08/03/2017 0532   APPEARANCEUR CLEAR 08/03/2017 0532   LABSPEC 1.018 08/03/2017 0532   PHURINE 6.0 08/03/2017 0532   GLUCOSEU NEGATIVE 08/03/2017 0532   HGBUR NEGATIVE 08/03/2017 0532   BILIRUBINUR NEGATIVE 08/03/2017 0532   KETONESUR NEGATIVE 08/03/2017 0532   PROTEINUR 100 (A) 08/03/2017 0532   UROBILINOGEN 0.2 01/20/2014 1322   NITRITE NEGATIVE 08/03/2017 0532   LEUKOCYTESUR NEGATIVE 08/03/2017 0532    Radiological Exams on Admission: Dg Chest 2 View  Result Date: 08/14/2017 CLINICAL DATA:  77 year old female with weakness. EXAM: CHEST - 2 VIEW COMPARISON:  Chest radiograph dated 08/13/2017 FINDINGS: Right pectoral Port-A-Cath with tip at the cavoatrial junction. Diffuse interstitial coarsening and bronchitic changes. There is no focal consolidation, pleural effusion, or pneumothorax. The cardiac silhouette is within normal limits. Coronary stents noted. There is osteopenia with degenerative changes of the spine. No acute osseous pathology. IMPRESSION: No active cardiopulmonary disease. Electronically Signed   By: Anner Crete M.D.   On: 08/14/2017 21:33   Dg Chest 2 View  Result Date: 08/13/2017 CLINICAL DATA:  Patient hospitalized last week for pneumonia with persistent productive cough. History of left upper  lobe lung cancer. Patient currently on chemotherapy. EXAM: CHEST - 2 VIEW COMPARISON:  08/07/2017 and chest CT 08/08/2017 FINDINGS: Right IJ Port-A-Cath unchanged. Lungs are adequately inflated with no significant change in patient's peripheral lateral left upper lobe lung cancer. No definite focal airspace consolidation. Mild prominence of the central perihilar markings which may indicate a mild degree of vascular congestion. No effusion. Cardiomediastinal silhouette and remainder of the exam is unchanged. IMPRESSION: No evidence of pneumonia. Suggestion mild vascular congestion. Known stable lung cancer over the lateral left upper lobe. Electronically Signed   By: Marin Olp M.D.   On: 08/13/2017 19:18     Assessment/Plan Active Problems:   Type 2 diabetes mellitus with hypoglycemia without coma (HCC)   Hyperlipidemia   CAD (coronary artery disease)   Tobacco abuse   Essential hypertension   Acute CVA (cerebrovascular accident) ([place])   Moderate protein-calorie malnutrition ([place])   Primary small cell carcinoma of left lung ([place])   Port-A-Cath in place   Pancytopenia ([place])   #) Lower extremity edema: This appears to be quite minimal on exam. -Follow-up BNP -Echo ordered  #) Dehydration/failure to thrive: Patient apparently has had increasing weight loss and diminished p.o. intake.  She has an elevated BUN and mild hyperkalemia today. -Gentle IV fluids -Nutrition consult  #) Recent viral pneumonia: Unfortunately her white count continues to remain elevated this could very well simply be Neupogen.  It is less clear is why her platelets are also slowly going up raising concern for this acute phase reactant could be suggesting underlying infection -Procalcitonin ordered  #) Coronary artery disease status post stents: - Continue aspirin 81 mg daily -Continue statin -Continue clopidogrel 75 mg daily - Metoprolol succinate 50 mg daily  #) Hyperlipidemia/hypertension: -Continue  amlodipine 10 mg  daily -Continue atorvastatin 80 mg daily -Continue beta-blocker  #) Type 2 diabetes: Patient apparently was recently taken off standing insulin and only placed on sliding scale. -Sliding scale insulin, AC at bedtime -Hold metformin thousand milligrams twice daily -Hold sitagliptin 50 mg daily  #) CVA: - Continue aspirin 81 mg -Continue statin  Fluids: Gentle IV fluids Lites: Monitor Nutrition: Carb modified diet, nutrition consult  Prophylaxis: Enoxaparin  Disposition: Pending IV hydration  DO NOT RESUSCITATE  Cristy Folks MD [facility]   If 7PM-7AM, please contact night-coverage [email or web address] Password [email or web address]  08/15/2017, 12:25 AM

## 2017-08-16 ENCOUNTER — Observation Stay (HOSPITAL_BASED_OUTPATIENT_CLINIC_OR_DEPARTMENT_OTHER): Payer: Medicare Other

## 2017-08-16 DIAGNOSIS — E785 Hyperlipidemia, unspecified: Secondary | ICD-10-CM | POA: Diagnosis not present

## 2017-08-16 DIAGNOSIS — E11649 Type 2 diabetes mellitus with hypoglycemia without coma: Secondary | ICD-10-CM | POA: Diagnosis not present

## 2017-08-16 DIAGNOSIS — I10 Essential (primary) hypertension: Secondary | ICD-10-CM | POA: Diagnosis not present

## 2017-08-16 DIAGNOSIS — I361 Nonrheumatic tricuspid (valve) insufficiency: Secondary | ICD-10-CM | POA: Diagnosis not present

## 2017-08-16 DIAGNOSIS — E860 Dehydration: Secondary | ICD-10-CM | POA: Diagnosis not present

## 2017-08-16 DIAGNOSIS — I639 Cerebral infarction, unspecified: Secondary | ICD-10-CM

## 2017-08-16 LAB — ECHOCARDIOGRAM COMPLETE
Height: 61 in
Weight: 1328 oz

## 2017-08-16 LAB — PROCALCITONIN: Procalcitonin: <0.1 ng/mL

## 2017-08-16 LAB — GLUCOSE, CAPILLARY: Glucose-Capillary: 148 mg/dL — ABNORMAL HIGH (ref 65–99)

## 2017-08-16 MED ORDER — HEPARIN SOD (PORK) LOCK FLUSH 100 UNIT/ML IV SOLN
500.0000 [IU] | INTRAVENOUS | Status: AC | PRN
  Administered 2017-08-16: 500 [IU]

## 2017-08-16 MED ORDER — PROCHLORPERAZINE MALEATE 10 MG PO TABS
10.0000 mg | ORAL_TABLET | Freq: Four times a day (QID) | ORAL | Status: DC | PRN
  Filled 2017-08-16: qty 1

## 2017-08-16 MED ORDER — ONDANSETRON 4 MG PO TBDP: 4.0000 mg | ORAL_TABLET | Freq: Three times a day (TID) | ORAL | Status: DC | PRN

## 2017-08-16 MED ORDER — IPRATROPIUM-ALBUTEROL 0.5-2.5 (3) MG/3ML IN SOLN
3.0000 mL | Freq: Once | RESPIRATORY_TRACT | Status: AC
  Administered 2017-08-16: 3 mL via RESPIRATORY_TRACT

## 2017-08-16 NOTE — Evaluation (Signed)
Physical Therapy Evaluation Patient Details Name: Katherine Cole MRN: [id number] DOB: 05-Aug-1940 Today's Date: 08/16/2017   History of Present Illness  Pt is a 77 y/o female admitted secondary to fever and low blood sugar. Pt also with sepsis secondary to HCAP. 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   Clinical Impression  Pt admitted as above and currently mobilizing at sup to independent level.  Pt ambulating in halls and performing balance challenges with no LOB.  Pt reports she is mobilizing at baseline.  SaO2 monitored throughout and did not fall below 95% with HR under 80.  Pt eager to dc home with family .     Follow Up Recommendations No PT follow up    Equipment Recommendations  None recommended by PT    Recommendations for Other Services       Precautions / Restrictions Precautions Precautions: Fall Restrictions Weight Bearing Restrictions: No      Mobility  Bed Mobility Overal bed mobility: Modified Independent                Transfers Overall transfer level: Needs assistance Equipment used: None Transfers: Sit to/from Stand Sit to Stand: Supervision         General transfer comment: Supervision for safety.   Ambulation/Gait Ambulation/Gait assistance: Supervision;Min guard Ambulation Distance (Feet): 300 Feet   Gait Pattern/deviations: Step-through pattern;Decreased stride length Gait velocity: mod pace   General Gait Details: No LOB, pt side stepped, back stepped and stork standing without LOB  Stairs            Wheelchair Mobility    Modified Rankin (Stroke Patients Only)       Balance Overall balance assessment: Needs assistance Sitting-balance support: No upper extremity supported;Feet supported Sitting balance-Leahy Scale: Normal     Standing balance support: No upper extremity supported;During functional activity;Single extremity supported Standing balance-Leahy Scale:  Good                               Pertinent Vitals/Pain Pain Assessment: No/denies pain    Home Living Family/patient expects to be discharged to:: Private residence Living Arrangements: Children;Other relatives Available Help at Discharge: Family Type of Home: House Home Access: Stairs to enter Entrance Stairs-Rails: None Entrance Stairs-Number of Steps: 1 Home Layout: One level Home Equipment: [place] - 2 wheels;Shower seat      Prior Function Level of Independence: Independent               Hand Dominance   Dominant Hand: Right    Extremity/Trunk Assessment   Upper Extremity Assessment Upper Extremity Assessment: Generalized weakness    Lower Extremity Assessment Lower Extremity Assessment: Generalized weakness    Cervical / Trunk Assessment Cervical / Trunk Assessment: Normal  Communication   Communication: No difficulties  Cognition Arousal/Alertness: Awake/alert Behavior During Therapy: WFL for tasks assessed/performed Overall Cognitive Status: Within Functional Limits for tasks assessed                                        General Comments      Exercises     Assessment/Plan    PT Assessment Patent does not need any further PT services  PT Problem List Decreased strength       PT Treatment Interventions  PT Goals (Current goals can be found in the Care Plan section)  Acute Rehab PT Goals Patient Stated Goal: to go home  PT Goal Formulation: With patient Time For Goal Achievement: 08/19/17 Potential to Achieve Goals: Good    Frequency     Barriers to discharge        Co-evaluation               AM-PAC PT "6 Clicks" Daily Activity  Outcome Measure Difficulty turning over in bed (including adjusting bedclothes, sheets and blankets)?: None Difficulty moving from lying on back to sitting on the side of the bed? : A Little Difficulty sitting down on and standing up from a chair with arms  (e.g., wheelchair, bedside commode, etc,.)?: A Little Help needed moving to and from a bed to chair (including a wheelchair)?: None Help needed walking in hospital room?: None Help needed climbing 3-5 steps with a railing? : A Little 6 Click Score: 21    End of Session Equipment Utilized During Treatment: Gait belt Activity Tolerance: Patient tolerated treatment well Patient left: in chair;with call bell/phone within reach;with family/visitor present Nurse Communication: Mobility status PT Visit Diagnosis: Unsteadiness on feet (R26.81);Other abnormalities of gait and mobility (R26.89)    Time: 1105-1120 PT Time Calculation (min) (ACUTE ONLY): 15 min   Charges:   PT Evaluation $PT Eval Low Complexity: 1 Low     PT G Codes:        Pg [phone]   Katherine Cole 08/16/2017, 11:31 AM

## 2017-08-16 NOTE — Progress Notes (Signed)
Provided discharge instructions to patient.  Pt verbalized understanding of instructions and follow-up care.  No questions/concerns at this time.  Pt discharging to home with all belongings.

## 2017-08-16 NOTE — Discharge Summary (Signed)
Physician Discharge Summary  SOLEI WUBBEN SWN:[id number] DOB: 23-Dec-1940 DOA: 08/14/2017  PCP: Prince Solian, MD  Admit date: 08/14/2017 Discharge date: 08/16/2017  Admitted From: Home Disposition:  Home  Recommendations for Outpatient Follow-up:  1. Follow up with PCP in 1-2 weeks 2. Please obtain BMP/CBC in one week Please follow up with oncology as recommended.   Discharge Condition:stable. 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     Discharge Diagnoses:  Active Problems:   Type 2 diabetes mellitus with hypoglycemia without coma (HCC)   Hyperlipidemia   CAD (coronary artery disease)   Tobacco abuse   Essential hypertension   Moderate protein-calorie malnutrition ([place])   Primary small cell carcinoma of left lung ([place])   Port-A-Cath in place   Pancytopenia (HCC)   Dehydration    Dehydration: Resolved with IV fluids.    Failure to thrive and generalized weakness: Recommended nutrition consult and PT evaluation.    Diabetes mellitus:  CBG (last 3)  Recent Labs    08/15/17 1646 08/15/17 2226 08/16/17 0859  GLUCAP 181* 250* 148*   Resume SSI.    Small cell lung cancer:  Follows up with Dr Julien Nordmann.  Plan for chem this Monday.    Hypertension: Well controlled.   Questionable lower extremity edema:  Echocardiogram reviewed and discussed the results twith the patient's daughter.    Discharge Instructions  Discharge Instructions    Diet - low sodium heart healthy   Complete by:  As directed     Discharge instructions   Complete by:  As directed    Follow up with PCP in one week. Please follow up with Dr Julien Nordmann as recommended.     Allergies as of 08/16/2017      Reactions   Codeine Other (See Comments)   "gives me a migraine" (03/11/2012)   Morphine And Related Other (See Comments)   "gives me a migraine" (03/11/2012)      Medication List    STOP taking these medications   insulin glargine 100 UNIT/ML injection Commonly known as:  LANTUS     TAKE these medications   ALPRAZolam 0.5 MG tablet Commonly known as:  XANAX Take 1 tablet (0.5 mg total) by mouth at bedtime as needed for anxiety or sleep. What changed:  when to take this   amLODipine 10 MG tablet Commonly known as:  NORVASC Take 1 tablet (10 mg total) by mouth daily.   aspirin 81 MG tablet Take 1 tablet (81 mg total) by mouth daily.   atorvastatin 80 MG tablet Commonly known as:  LIPITOR Take 80 mg by mouth daily.   clopidogrel 75 MG tablet Commonly known as:  PLAVIX Take 75 mg by mouth daily.   fluticasone 50 MCG/ACT nasal spray Commonly known as:  FLONASE Place 1 spray into both nostrils daily.   guaiFENesin 600 MG 12 hr tablet Commonly known as:  MUCINEX Take 2 tablets (1,200 mg total) by mouth 2 (two) times daily as needed (Use for one week post discharge, then twice daily as needed). What changed:  reasons to take this   [facility]  100 UNIT/ML KiwkPen Generic drug:  insulin lispro 0-8 units three times daily after meals   ipratropium-albuterol 0.5-2.5 (3) MG/3ML Soln Commonly known as:  DUONEB Take 3 mLs by nebulization every 6 (six) hours as needed (Shortness of breath or wheezing).   lactose free nutrition Liqd Take 237 mLs by mouth 3 (three) times daily between meals.   loratadine 10 MG tablet Commonly known as:  CLARITIN Take 10 mg by mouth daily.   metFORMIN 1000 MG tablet Commonly known as:  GLUCOPHAGE Take 1,000 mg by mouth 2 (two) times daily with a meal.    metoprolol succinate 50 MG 24 hr tablet Commonly known as:  TOPROL-XL Take 50 mg by mouth daily. Take with or immediately following a meal.   mirtazapine 15 MG disintegrating tablet Commonly known as:  REMERON SOL-TAB ONE BY MOUTH DAILY AT BEDTIME   nitroGLYCERIN 0.4 MG SL tablet Commonly known as:  NITROSTAT Place 1 tablet (0.4 mg total) under the tongue every 5 (five) minutes as needed (up to 3 doses). For chest pain   ondansetron 4 MG disintegrating tablet Commonly known as:  ZOFRAN ODT Take 1 tablet (4 mg total) by mouth every 8 (eight) hours as needed for nausea or vomiting.   prochlorperazine 10 MG tablet Commonly known as:  COMPAZINE Take 1 tablet (10 mg total) by mouth every 6 (six) hours as needed for nausea or vomiting.   sitaGLIPtin 100 MG tablet Commonly known as:  JANUVIA Take 0.5 tablets (50 mg total) by mouth daily.   sodium chloride 0.65 % Soln nasal spray Commonly known as:  OCEAN Place 1 spray into both nostrils as needed for congestion (use twice daily for one week post discharge, then as needed).      Follow-up Information    [facility]   Specialty:  Home Health Services Contact information: [place] [address] [phone]        Prince Solian, MD. Schedule an appointment as soon as possible for a visit in 1 week(s).   Specialty:  Internal Medicine Contact information: [place] [address] [phone]          Allergies  Allergen Reactions  . Codeine Other (See Comments)    "gives me a migraine" (03/11/2012)  . Morphine And Related Other (See Comments)    "gives me a migraine" (03/11/2012)    Consultations:  None.    Procedures/Studies: Dg Chest 2 View  Result Date: 08/14/2017 CLINICAL DATA:  77 year old female with weakness. EXAM: CHEST - 2 VIEW COMPARISON:  Chest radiograph dated 08/13/2017 FINDINGS: Right pectoral Port-A-Cath with tip at the cavoatrial  junction. Diffuse interstitial coarsening and bronchitic changes. There is no focal consolidation, pleural effusion, or pneumothorax. The cardiac silhouette is within normal limits. Coronary stents noted. There is osteopenia with degenerative changes of the spine. No acute osseous pathology. IMPRESSION: No active cardiopulmonary disease. Electronically Signed   By: Anner Crete M.D.   On: 08/14/2017 21:33   Dg Chest 2 View  Result Date: 08/13/2017 CLINICAL DATA:  Patient hospitalized last week for pneumonia with persistent productive cough. History of left upper lobe lung cancer. Patient currently on chemotherapy. EXAM: CHEST - 2 VIEW COMPARISON:  08/07/2017 and chest CT 08/08/2017 FINDINGS: Right IJ Port-A-Cath unchanged. Lungs are adequately inflated with no significant change in patient's peripheral lateral left upper lobe lung cancer. No definite focal airspace consolidation. Mild prominence of the central perihilar markings which may indicate a mild degree  of vascular congestion. No effusion. Cardiomediastinal silhouette and remainder of the exam is unchanged. IMPRESSION: No evidence of pneumonia. Suggestion mild vascular congestion. Known stable lung cancer over the lateral left upper lobe. Electronically Signed   By: Marin Olp M.D.   On: 08/13/2017 19:18   Dg Chest 2 View  Result Date: 08/02/2017 CLINICAL DATA:  Cough.  Low blood sugar for 2 days. EXAM: CHEST - 2 VIEW COMPARISON:  06/30/2017.  PET-CT, 07/17/2017. FINDINGS: Cardiac silhouette is normal in size. No mediastinal masses. Prominent left hilum consistent with adenopathy as noted on the prior PET-CT. Peripheral left upper lobe pleural based opacity is less apparent on the prior chest radiograph. Lungs are hyperexpanded. There is no evidence of pneumonia or pulmonary edema. No pleural effusion or pneumothorax. Right anterior chest wall Port-A-Cath is new from the prior exam. Tip projects in the lower superior vena cava. Skeletal  structures are demineralized but grossly intact. IMPRESSION: 1. No acute cardiopulmonary disease. 2. Left upper lobe pleural-based lung mass consistent with carcinoma is less apparent than on the prior study. Prominent left hilum is consistent with metastatic adenopathy. Electronically Signed   By: Lajean Manes M.D.   On: 08/02/2017 20:49   Ct Chest W Contrast  Result Date: 08/08/2017 CLINICAL DATA:  Acute respiratory illness. Left upper lobe lung cancer, on chemotherapy. EXAM: CT CHEST WITH CONTRAST TECHNIQUE: Multidetector CT imaging of the chest was performed during intravenous contrast administration. CONTRAST:  17mL OMNIPAQUE IOHEXOL 300 MG/ML  SOLN COMPARISON:  Chest radiograph dated 08/07/2017. PET-CT dated 07/17/2017. FINDINGS: Cardiovascular: The heart is normal in size. No pericardial effusion. No evidence of thoracic aortic aneurysm. Atherosclerotic calcifications of the aortic arch. Three vessel coronary atherosclerosis. Right chest port terminating at the cavoatrial junction. Mediastinum/Nodes: 12 mm short axis AP window node (series 3/image 58), previously 17 mm. 14 mm short axis left perihilar node (series 3/image 78), previously 19 mm when measured in a similar fashion. Visualized thyroid is unremarkable. Lungs/Pleura: 3.0 x 1.8 cm subpleural mass in the lateral left upper lobe (series 7/image 61), previously 4.2 x 2.2 cm, corresponding to the patient's known primary bronchogenic neoplasm. Surrounding mild ground-glass opacity. Mild patchy opacity in the left lower lobe (series 7/image 111), suspicious for pneumonia, less likely atelectasis. Additional bibasilar atelectasis. Small bilateral pleural effusions. Mild biapical pleural-parenchymal scarring. No pneumothorax. Upper Abdomen: Visualized upper abdomen is notable for a 10 mm metastasis in segment 4B (series 3/image 145), previously 14 mm. Musculoskeletal: Mild degenerative changes of the visualized thoracolumbar spine. IMPRESSION: 3.0 cm  subpleural mass in the lateral left upper lobe, corresponding to the patient's known primary bronchogenic neoplasm, decreased. Improving mediastinal and left perihilar nodal metastases. 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 Mild patchy left lower lobe opacity, suspicious for pneumonia, less likely atelectasis. Bibasilar atelectasis with associated small bilateral pleural effusions. Aortic Atherosclerosis (ICD10-I70.0). Electronically Signed   By: Julian Hy M.D.   On: 08/08/2017 21:25   Nm Pet Image Initial (pi) Skull Base To Thigh  Result Date: 07/17/2017 CLINICAL DATA:  Initial treatment strategy for pulmonary nodule. EXAM: NUCLEAR MEDICINE PET SKULL BASE TO THIGH TECHNIQUE: 5.8 mCi F-18 FDG was injected intravenously. Full-ring PET imaging was performed from the skull base to thigh after the radiotracer. CT data was obtained and used for attenuation correction and anatomic localization. Fasting blood glucose: 73 mg/dl COMPARISON:  CT chest 06/30/2017 FINDINGS: Mediastinal blood pool activity: SUV max 1.6 NECK: No hypermetabolic lymph nodes in the neck. Incidental CT findings: None CHEST: Enlarged and  hypermetabolic left-sided mediastinal and hilar lymph nodes. Index left AP window lymph node measures 1.7 cm and has an SUV max equal to 11.48. Left hilar nodal mass measures 4.4 cm and has an SUV max equal to 11.18. Within the periphery of the left upper lobe there is a subpleural mass measuring 4.2 cm within SUV max equal to 10.8. Incidental CT findings: Aortic atherosclerosis. Calcifications within the LAD, left circumflex and RCA coronary arteries. ABDOMEN/PELVIS: 1.4 cm lesion within segment 4 of the liver measures 1.4 cm and has an SUV max equal to 5.0. No additional hypermetabolic liver lesions. No abnormal uptake within the adrenal glands, pancreas or spleen. Incidental CT findings: Aortic atherosclerosis. Infrarenal abdominal aortic ectasia measures 2.8 cm, image 159/3. Nonobstructing left  renal calculus measures 7 mm. SKELETON: Hypermetabolic lesions within the central sacrum has an SUV max equal to 5.79. A second hypermetabolic lesion within the posterior column of the right acetabulum has an SUV max equal to 5.2. Incidental CT findings: No suspicious lytic or sclerotic bone lesions identified on the corresponding CT images. Chronic healed right inferior pubic rami fracture noted. IMPRESSION: 1. Subpleural mass within the left upper lobe is intensely hypermetabolic compatible with primary bronchogenic carcinoma. There is associated ipsilateral hilar and mediastinal nodal metastasis, liver metastasis and bone metastasis. 2.  Aortic Atherosclerosis (ICD10-I70.0). 3. 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 Recommend followup by ultrasound in 5 years. This recommendation follows ACR consensus guidelines: White Paper of the ACR Incidental Findings Committee II on Vascular Findings. J Am Coll Radiol 2013; 10:789-794. 4. Three vessel coronary artery calcifications noted. Electronically Signed   By: Kerby Moors M.D.   On: 07/17/2017 14:49   Ir US Guide Vasc Access Right  Result Date: 07/18/2017 INDICATION: 77 year old female with a history of lung carcinoma EXAM: IMPLANTED PORT A CATH PLACEMENT WITH ULTRASOUND AND FLUOROSCOPIC GUIDANCE MEDICATIONS: 2.0 g Ancef; The antibiotic was administered within an appropriate time interval prior to skin puncture. ANESTHESIA/SEDATION: Moderate (conscious) sedation was employed during this procedure. A total of Versed 2.0 mg and Fentanyl 100 mcg was administered intravenously. Moderate Sedation Time: 21 minutes. The patient's level of consciousness and vital signs were monitored continuously by radiology nursing throughout the procedure under my direct supervision. FLUOROSCOPY TIME:  0 minutes, 6 seconds (1 mGy) COMPLICATIONS: None PROCEDURE: The procedure, risks, benefits, and alternatives were explained to the patient. Questions regarding the  procedure were encouraged and answered. The patient understands and consents to the procedure. Ultrasound survey was performed with images stored and sent to PACs. The right neck and chest was prepped with chlorhexidine, and draped in the usual sterile fashion using maximum barrier technique (cap and mask, sterile gown, sterile gloves, large sterile sheet, hand hygiene and cutaneous antiseptic). Antibiotic prophylaxis was provided with 2.0g Ancef administered IV one hour prior to skin incision. Local anesthesia was attained by infiltration with 1% lidocaine without epinephrine. Ultrasound demonstrated patency of the right internal jugular vein, and this was documented with an image. Under real-time ultrasound guidance, this vein was accessed with a 21 gauge micropuncture needle and image documentation was performed. A small dermatotomy was made at the access site with an 11 scalpel. A 0.018" wire was advanced into the SVC and used to estimate the length of the internal catheter. The access needle exchanged for a 22F micropuncture vascular sheath. The 0.018" wire was then removed and a 0.035" wire advanced into the IVC. An appropriate location for the subcutaneous reservoir was selected below the clavicle and  an incision was made through the skin and underlying soft tissues. The subcutaneous tissues were then dissected using a combination of blunt and sharp surgical technique and a pocket was formed. A single lumen power injectable portacatheter was then tunneled through the subcutaneous tissues from the pocket to the dermatotomy and the port reservoir placed within the subcutaneous pocket. The venous access site was then serially dilated and a peel away vascular sheath placed over the wire. The wire was removed and the port catheter advanced into position under fluoroscopic guidance. The catheter tip is positioned in the cavoatrial junction. This was documented with a spot image. The portacatheter was then tested and  found to flush and aspirate well. The port was flushed with saline followed by 100 units/mL heparinized saline. The pocket was then closed in two layers using first subdermal inverted interrupted absorbable sutures followed by a running subcuticular suture. The epidermis was then sealed with Dermabond. The dermatotomy at the venous access site was also seal with Dermabond. Patient tolerated the procedure well and remained hemodynamically stable throughout. No complications encountered and no significant blood loss encountered IMPRESSION: Status post right IJ port catheter placement. Catheter ready for use. Signed, Dulcy Fanny. Earleen Newport, DO [facility] Radiology Electronically Signed   By: Corrie Mckusick D.O.   On: 07/18/2017 11:00   Dg Chest Port 1 View  Result Date: 08/07/2017 CLINICAL DATA:  Wheezing. EXAM: PORTABLE CHEST 1 VIEW COMPARISON:  08/02/2017 FINDINGS: A right jugular Port-A-Cath terminates over the mid SVC. The cardiac silhouette is normal in size. Mild left hilar prominence corresponds to known lymphadenopathy. Aortic atherosclerosis and coronary artery stents are noted. The lungs remain hyperinflated. Peripheral opacity in the lateral left mid lung is slightly more prominent and corresponds to the previously demonstrated lung cancer. There is peribronchial scratched of there is increased peribronchial thickening and interstitial accentuation bilaterally with mild patchy airspace opacity in the right midlung and both lung bases. No sizable pleural effusion or pneumothorax is identified. No acute osseous abnormality is seen. IMPRESSION: Increasing bilateral interstitial and patchy airspace opacities concerning for worsening pneumonia. Electronically Signed   By: Logan Bores M.D.   On: 08/07/2017 09:30   Ir Fluoro Guide Port Insertion Right  Result Date: 07/18/2017 INDICATION: 77 year old female with a history of lung carcinoma EXAM: IMPLANTED PORT A CATH  PLACEMENT WITH ULTRASOUND AND FLUOROSCOPIC GUIDANCE MEDICATIONS: 2.0 g Ancef; The antibiotic was administered within an appropriate time interval prior to skin puncture. ANESTHESIA/SEDATION: Moderate (conscious) sedation was employed during this procedure. A total of Versed 2.0 mg and Fentanyl 100 mcg was administered intravenously. Moderate Sedation Time: 21 minutes. The patient's level of consciousness and vital signs were monitored continuously by radiology nursing throughout the procedure under my direct supervision. FLUOROSCOPY TIME:  0 minutes, 6 seconds (1 mGy) COMPLICATIONS: None PROCEDURE: The procedure, risks, benefits, and alternatives were explained to the patient. Questions regarding the procedure were encouraged and answered. The patient understands and consents to the procedure. Ultrasound survey was performed with images stored and sent to PACs. The right neck and chest was prepped with chlorhexidine, and draped in the usual sterile fashion using maximum barrier technique (cap and mask, sterile gown, sterile gloves, large sterile sheet, hand hygiene and cutaneous antiseptic). Antibiotic prophylaxis was provided with 2.0g Ancef administered IV one hour prior to skin incision. Local anesthesia was attained by infiltration with 1% lidocaine without epinephrine. Ultrasound demonstrated patency of the right internal jugular vein, and this was documented with an  image. Under real-time ultrasound guidance, this vein was accessed with a 21 gauge micropuncture needle and image documentation was performed. A small dermatotomy was made at the access site with an 11 scalpel. A 0.018" wire was advanced into the SVC and used to estimate the length of the internal catheter. The access needle exchanged for a 20F micropuncture vascular sheath. The 0.018" wire was then removed and a 0.035" wire advanced into the IVC. An appropriate location for the subcutaneous reservoir was selected below the clavicle and an incision  was made through the skin and underlying soft tissues. The subcutaneous tissues were then dissected using a combination of blunt and sharp surgical technique and a pocket was formed. A single lumen power injectable portacatheter was then tunneled through the subcutaneous tissues from the pocket to the dermatotomy and the port reservoir placed within the subcutaneous pocket. The venous access site was then serially dilated and a peel away vascular sheath placed over the wire. The wire was removed and the port catheter advanced into position under fluoroscopic guidance. The catheter tip is positioned in the cavoatrial junction. This was documented with a spot image. The portacatheter was then tested and found to flush and aspirate well. The port was flushed with saline followed by 100 units/mL heparinized saline. The pocket was then closed in two layers using first subdermal inverted interrupted absorbable sutures followed by a running subcuticular suture. The epidermis was then sealed with Dermabond. The dermatotomy at the venous access site was also seal with Dermabond. Patient tolerated the procedure well and remained hemodynamically stable throughout. No complications encountered and no significant blood loss encountered IMPRESSION: Status post right IJ port catheter placement. Catheter ready for use. Signed, Dulcy Fanny. Earleen Newport, DO [facility] Radiology Electronically Signed   By: Corrie Mckusick D.O.   On: 07/18/2017 11:00       Subjective: No new complaints. Feels good.   Discharge Exam: Vitals:   08/15/17 2220 08/16/17 0449  BP: (!) 145/52 (!) 144/58  Pulse: 66 71  Resp: 14 13  Temp: 98.2 F (36.8 C) 97.9 F (36.6 C)  SpO2: 97% 94%   Vitals:   08/15/17 1444 08/15/17 1910 08/15/17 2220 08/16/17 0449  BP: (!) 156/60  (!) 145/52 (!) 144/58  Pulse: 65  66 71  Resp:   14 13  Temp: 98.7 F (37.1 C)  98.2 F (36.8 C) 97.9 F (36.6 C)  TempSrc:  Oral  Oral Oral  SpO2: 97%  97% 94%  Weight:  37.6 kg (83 lb)    Height:  5\' 1"  (1.549 m)      General: Pt is alert, awake, not in acute distress Cardiovascular: RRR, S1/S2 +, no rubs, no gallops Respiratory: CTA bilaterally, no wheezing, no rhonchi Abdominal: Soft, NT, ND, bowel sounds + Extremities: no edema, no cyanosis    The results of significant diagnostics from this hospitalization (including imaging, microbiology, ancillary and laboratory) are listed below for reference.     Microbiology: No results found for this or any previous visit (from the past 240 hour(s)).   Labs: BNP (last 3 results) Recent Labs    08/14/17 2102  BNP 315.4*   Basic Metabolic Panel: Recent Labs  Lab 08/11/17 1220 08/13/17 1759 08/14/17 2102 08/15/17 0635  NA 137 139 138 137  K 3.9 4.2 5.4* 4.1  CL 102 105 104 103  CO2 28 26 26 24   GLUCOSE 117 210* 280* 229*  BUN 17 25* 30* 25*  CREATININE  0.76 0.83 0.91 0.79  CALCIUM 9.6 9.1 8.4* 8.9   Liver Function Tests: Recent Labs  Lab 08/11/17 1220 08/13/17 1759 08/14/17 2102  AST 24 20 21   ALT 32 24 22  ALKPHOS 146 127* 110  BILITOT 0.3 0.6 0.3  PROT 7.2 6.7 6.6  ALBUMIN 3.0* 2.9* 2.9*   No results for input(s): LIPASE, AMYLASE in the last 168 hours. No results for input(s): AMMONIA in the last 168 hours. CBC: Recent Labs  Lab 08/11/17 1220 08/13/17 1759 08/14/17 2102 08/15/17 0635  WBC 16.6* 13.9* 16.8* 11.7*  NEUTROABS 13.2* 9.9* 13.0*  --   HGB 10.6* 10.5* 9.2* 9.3*  HCT 31.6* 32.0* 27.8* 28.6*  MCV 88.8 89.9 89.1 90.2  PLT 464* 787* 835* 831*   Cardiac Enzymes: Recent Labs  Lab 08/14/17 2102  TROPONINI <0.03   BNP: Invalid input(s): POCBNP CBG: Recent Labs  Lab 08/15/17 0829 08/15/17 1203 08/15/17 1646 08/15/17 2226 08/16/17 0859  GLUCAP 186* 140* 181* 250* 148*   D-Dimer No results for input(s): DDIMER in the last 72 hours. Hgb A1c No results for input(s): HGBA1C in the last 72 hours. Lipid  Profile No results for input(s): CHOL, HDL, LDLCALC, TRIG, CHOLHDL, LDLDIRECT in the last 72 hours. Thyroid function studies No results for input(s): TSH, T4TOTAL, T3FREE, THYROIDAB in the last 72 hours.  Invalid input(s): FREET3 Anemia work up No results for input(s): VITAMINB12, FOLATE, FERRITIN, TIBC, IRON, RETICCTPCT in the last 72 hours. Urinalysis    Component Value Date/Time   COLORURINE YELLOW 08/14/2017 2332   APPEARANCEUR HAZY (A) 08/14/2017 2332   LABSPEC 1.020 08/14/2017 2332   PHURINE 5.0 08/14/2017 2332   GLUCOSEU NEGATIVE 08/14/2017 2332   HGBUR NEGATIVE 08/14/2017 2332   BILIRUBINUR NEGATIVE 08/14/2017 2332   KETONESUR NEGATIVE 08/14/2017 2332   PROTEINUR 100 (A) 08/14/2017 2332   UROBILINOGEN 0.2 01/20/2014 1322   NITRITE NEGATIVE 08/14/2017 2332   LEUKOCYTESUR NEGATIVE 08/14/2017 2332   Sepsis Labs Invalid input(s): PROCALCITONIN,  WBC,  LACTICIDVEN Microbiology No results found for this or any previous visit (from the past 240 hour(s)).   Time coordinating discharge: 32  minutes  SIGNED:   Hosie Poisson, MD  [facility] 08/16/2017, 11:58 AM Pager   If 7PM-7AM, please contact night-coverage [email or web address] Password [email or web address]

## 2017-08-16 NOTE — Progress Notes (Signed)
  Echocardiogram 2D Echocardiogram has been performed.  Katherine Cole 08/16/2017, 10:04 AM

## 2017-08-18 ENCOUNTER — Telehealth: Payer: Self-pay | Admitting: Medical Oncology

## 2017-08-18 ENCOUNTER — Inpatient Hospital Stay: Payer: Medicare Other

## 2017-08-18 ENCOUNTER — Other Ambulatory Visit: Payer: Self-pay | Admitting: Medical Oncology

## 2017-08-18 ENCOUNTER — Other Ambulatory Visit: Payer: Self-pay | Admitting: Internal Medicine

## 2017-08-18 VITALS — BP 141/89 | Temp 97.9°F

## 2017-08-18 DIAGNOSIS — Z95828 Presence of other vascular implants and grafts: Secondary | ICD-10-CM

## 2017-08-18 DIAGNOSIS — C3412 Malignant neoplasm of upper lobe, left bronchus or lung: Secondary | ICD-10-CM

## 2017-08-18 DIAGNOSIS — C3492 Malignant neoplasm of unspecified part of left bronchus or lung: Secondary | ICD-10-CM

## 2017-08-18 DIAGNOSIS — Z5111 Encounter for antineoplastic chemotherapy: Secondary | ICD-10-CM | POA: Diagnosis not present

## 2017-08-18 LAB — CMP (CANCER CENTER ONLY)
ALT: 17 U/L (ref 0–55)
AST: 16 U/L (ref 5–34)
Albumin: 3.2 g/dL — ABNORMAL LOW (ref 3.5–5.0)
Alkaline Phosphatase: 130 U/L (ref 40–150)
Anion gap: 8 (ref 3–11)
BUN: 20 mg/dL (ref 7–26)
CHLORIDE: 102 mmol/L (ref 98–109)
CO2: 27 mmol/L (ref 22–29)
CREATININE: 0.99 mg/dL (ref 0.60–1.10)
Calcium: 9.4 mg/dL (ref 8.4–10.4)
GFR, Estimated: 55 mL/min — ABNORMAL LOW (ref >60)
Glucose, Bld: 267 mg/dL — ABNORMAL HIGH (ref 70–140)
Potassium: 4.4 mmol/L (ref 3.5–5.1)
SODIUM: 137 mmol/L (ref 136–145)
Total Bilirubin: 0.3 mg/dL (ref 0.2–1.2)
Total Protein: 7.6 g/dL (ref 6.4–8.3)

## 2017-08-18 LAB — CBC WITH DIFFERENTIAL (CANCER CENTER ONLY)
Basophils Absolute: 0.1 10*3/uL (ref 0.0–0.1)
Basophils Relative: 1 %
Eosinophils Absolute: 0 10*3/uL (ref 0.0–0.5)
Eosinophils Relative: 0 %
HEMATOCRIT: 33.3 % — AB (ref 34.8–46.6)
HEMOGLOBIN: 11 g/dL — AB (ref 11.6–15.9)
LYMPHS ABS: 2.3 10*3/uL (ref 0.9–3.3)
Lymphocytes Relative: 19 %
MCH: 29.9 pg (ref 25.1–34.0)
MCHC: 33 g/dL (ref 31.5–36.0)
MCV: 90.5 fL (ref 79.5–101.0)
MONOS PCT: 10 %
Monocytes Absolute: 1.3 10*3/uL — ABNORMAL HIGH (ref 0.1–0.9)
NEUTROS ABS: 8.4 10*3/uL — AB (ref 1.5–6.5)
NEUTROS PCT: 70 %
Platelet Count: 899 10*3/uL — ABNORMAL HIGH (ref 145–400)
RBC: 3.68 MIL/uL — ABNORMAL LOW (ref 3.70–5.45)
RDW: 16.2 % — ABNORMAL HIGH (ref 11.2–14.5)
WBC Count: 12.1 10*3/uL — ABNORMAL HIGH (ref 3.9–10.3)

## 2017-08-18 MED ORDER — PALONOSETRON HCL INJECTION 0.25 MG/5ML
INTRAVENOUS | Status: AC
  Filled 2017-08-18: qty 5

## 2017-08-18 MED ORDER — PALONOSETRON HCL INJECTION 0.25 MG/5ML
0.2500 mg | Freq: Once | INTRAVENOUS | Status: AC
  Administered 2017-08-18: 0.25 mg via INTRAVENOUS

## 2017-08-18 MED ORDER — SODIUM CHLORIDE 0.9 % IV SOLN
Freq: Once | INTRAVENOUS | Status: AC
  Administered 2017-08-18: 15:00:00 via INTRAVENOUS

## 2017-08-18 MED ORDER — SODIUM CHLORIDE 0.9% FLUSH
10.0000 mL | Freq: Once | INTRAVENOUS | Status: DC
  Filled 2017-08-18: qty 10

## 2017-08-18 MED ORDER — SODIUM CHLORIDE 0.9 % IV SOLN
270.0000 mg | Freq: Once | INTRAVENOUS | Status: AC
  Administered 2017-08-18: 270 mg via INTRAVENOUS
  Filled 2017-08-18: qty 27

## 2017-08-18 MED ORDER — SODIUM CHLORIDE 0.9% FLUSH
10.0000 mL | INTRAVENOUS | Status: DC | PRN
  Administered 2017-08-18: 10 mL
  Filled 2017-08-18: qty 10

## 2017-08-18 MED ORDER — FOSAPREPITANT DIMEGLUMINE INJECTION 150 MG
Freq: Once | INTRAVENOUS | Status: AC
  Administered 2017-08-18: 16:00:00 via INTRAVENOUS
  Filled 2017-08-18: qty 5

## 2017-08-18 MED ORDER — SODIUM CHLORIDE 0.9 % IV SOLN
100.0000 mg/m2 | Freq: Once | INTRAVENOUS | Status: AC
  Administered 2017-08-18: 130 mg via INTRAVENOUS
  Filled 2017-08-18: qty 6.5

## 2017-08-18 MED ORDER — HEPARIN SOD (PORK) LOCK FLUSH 100 UNIT/ML IV SOLN
500.0000 [IU] | Freq: Once | INTRAVENOUS | Status: AC | PRN
  Administered 2017-08-18: 500 [IU]
  Filled 2017-08-18: qty 5

## 2017-08-18 NOTE — Progress Notes (Signed)
Ok to proceed with treatment today per Dr. Earlie Server. Pt feels "good". Does not need to see provider before treatment today per Dr. Earlie Server. Pharmacy informed and voiced understanding.

## 2017-08-18 NOTE — Telephone Encounter (Signed)
Pt here for lab and chemo. Saw Lacie last week and per Julien Nordmann okay to treat pt to day with chemo if pt feeling okay. Pt needs weekly labs and they are scheduled.

## 2017-08-18 NOTE — Patient Instructions (Signed)
[place] Discharge Instructions for Patients Receiving Chemotherapy  Today you received the following chemotherapy agents: Carboplatin, Etoposide   To help prevent nausea and vomiting after your treatment, we encourage you to take your nausea medication as prescribed.   If you develop nausea and vomiting that is not controlled by your nausea medication, call the clinic.   BELOW ARE SYMPTOMS THAT SHOULD BE REPORTED IMMEDIATELY:  *FEVER GREATER THAN 100.5 F  *CHILLS WITH OR WITHOUT FEVER  NAUSEA AND VOMITING THAT IS NOT CONTROLLED WITH YOUR NAUSEA MEDICATION  *UNUSUAL SHORTNESS OF BREATH  *UNUSUAL BRUISING OR BLEEDING  TENDERNESS IN MOUTH AND THROAT WITH OR WITHOUT PRESENCE OF ULCERS  *URINARY PROBLEMS  *BOWEL PROBLEMS  UNUSUAL RASH Items with * indicate a potential emergency and should be followed up as soon as possible.  Feel free to call the clinic should you have any questions or concerns. The clinic phone number is [phone].  Please show the [place] at check-in to the Emergency Department and triage nurse.

## 2017-08-19 ENCOUNTER — Inpatient Hospital Stay: Payer: Medicare Other

## 2017-08-19 VITALS — BP 158/63 | HR 69 | Temp 97.9°F | Resp 17 | Ht 61.0 in | Wt 88.3 lb

## 2017-08-19 DIAGNOSIS — Z5111 Encounter for antineoplastic chemotherapy: Secondary | ICD-10-CM | POA: Diagnosis not present

## 2017-08-19 DIAGNOSIS — C3412 Malignant neoplasm of upper lobe, left bronchus or lung: Secondary | ICD-10-CM

## 2017-08-19 MED ORDER — SODIUM CHLORIDE 0.9 % IV SOLN
Freq: Once | INTRAVENOUS | Status: AC
  Administered 2017-08-19: 14:00:00 via INTRAVENOUS

## 2017-08-19 MED ORDER — HEPARIN SOD (PORK) LOCK FLUSH 100 UNIT/ML IV SOLN
500.0000 [IU] | Freq: Once | INTRAVENOUS | Status: AC | PRN
  Administered 2017-08-19: 500 [IU]
  Filled 2017-08-19: qty 5

## 2017-08-19 MED ORDER — DEXAMETHASONE SODIUM PHOSPHATE 10 MG/ML IJ SOLN
10.0000 mg | Freq: Once | INTRAMUSCULAR | Status: AC
  Administered 2017-08-19: 10 mg via INTRAVENOUS

## 2017-08-19 MED ORDER — SODIUM CHLORIDE 0.9% FLUSH
10.0000 mL | INTRAVENOUS | Status: DC | PRN
  Administered 2017-08-19: 10 mL
  Filled 2017-08-19: qty 10

## 2017-08-19 MED ORDER — DEXAMETHASONE SODIUM PHOSPHATE 10 MG/ML IJ SOLN
INTRAMUSCULAR | Status: AC
  Filled 2017-08-19: qty 1

## 2017-08-19 MED ORDER — SODIUM CHLORIDE 0.9 % IV SOLN
100.0000 mg/m2 | Freq: Once | INTRAVENOUS | Status: AC
  Administered 2017-08-19: 130 mg via INTRAVENOUS
  Filled 2017-08-19: qty 6.5

## 2017-08-19 NOTE — Patient Instructions (Signed)
[place] Discharge Instructions for Patients Receiving Chemotherapy  Today you received the following chemotherapy agents:  Etooposide (VP 16)  To help prevent nausea and vomiting after your treatment, we encourage you to take your nausea medication as prescribed.   If you develop nausea and vomiting that is not controlled by your nausea medication, call the clinic.   BELOW ARE SYMPTOMS THAT SHOULD BE REPORTED IMMEDIATELY:  *FEVER GREATER THAN 100.5 F  *CHILLS WITH OR WITHOUT FEVER  NAUSEA AND VOMITING THAT IS NOT CONTROLLED WITH YOUR NAUSEA MEDICATION  *UNUSUAL SHORTNESS OF BREATH  *UNUSUAL BRUISING OR BLEEDING  TENDERNESS IN MOUTH AND THROAT WITH OR WITHOUT PRESENCE OF ULCERS  *URINARY PROBLEMS  *BOWEL PROBLEMS  UNUSUAL RASH Items with * indicate a potential emergency and should be followed up as soon as possible.  Feel free to call the clinic should you have any questions or concerns. The clinic phone number is [phone].  Please show the [place] at check-in to the Emergency Department and triage nurse.

## 2017-08-20 ENCOUNTER — Inpatient Hospital Stay: Payer: Medicare Other | Admitting: Nutrition

## 2017-08-20 ENCOUNTER — Inpatient Hospital Stay: Payer: Medicare Other

## 2017-08-20 VITALS — BP 152/77 | HR 75 | Temp 98.4°F | Resp 18

## 2017-08-20 DIAGNOSIS — C3412 Malignant neoplasm of upper lobe, left bronchus or lung: Secondary | ICD-10-CM

## 2017-08-20 DIAGNOSIS — Z5111 Encounter for antineoplastic chemotherapy: Secondary | ICD-10-CM | POA: Diagnosis not present

## 2017-08-20 MED ORDER — DEXAMETHASONE SODIUM PHOSPHATE 10 MG/ML IJ SOLN
10.0000 mg | Freq: Once | INTRAMUSCULAR | Status: AC
  Administered 2017-08-20: 10 mg via INTRAVENOUS

## 2017-08-20 MED ORDER — SODIUM CHLORIDE 0.9 % IV SOLN
Freq: Once | INTRAVENOUS | Status: AC
  Administered 2017-08-20: 15:00:00 via INTRAVENOUS

## 2017-08-20 MED ORDER — SODIUM CHLORIDE 0.9 % IV SOLN
100.0000 mg/m2 | Freq: Once | INTRAVENOUS | Status: AC
  Administered 2017-08-20: 130 mg via INTRAVENOUS
  Filled 2017-08-20: qty 6.5

## 2017-08-20 MED ORDER — SODIUM CHLORIDE 0.9% FLUSH
10.0000 mL | INTRAVENOUS | Status: DC | PRN
Start: 2017-08-20 — End: 2017-08-20
  Administered 2017-08-20: 10 mL
  Filled 2017-08-20: qty 10

## 2017-08-20 MED ORDER — PEGFILGRASTIM 6 MG/0.6ML ~~LOC~~ PSKT
6.0000 mg | PREFILLED_SYRINGE | Freq: Once | SUBCUTANEOUS | Status: AC
  Administered 2017-08-20: 6 mg via SUBCUTANEOUS

## 2017-08-20 MED ORDER — HEPARIN SOD (PORK) LOCK FLUSH 100 UNIT/ML IV SOLN
500.0000 [IU] | Freq: Once | INTRAVENOUS | Status: AC | PRN
  Administered 2017-08-20: 500 [IU]
  Filled 2017-08-20: qty 5

## 2017-08-20 MED ORDER — DEXAMETHASONE SODIUM PHOSPHATE 10 MG/ML IJ SOLN
INTRAMUSCULAR | Status: AC
  Filled 2017-08-20: qty 1

## 2017-08-20 MED ORDER — PEGFILGRASTIM 6 MG/0.6ML ~~LOC~~ PSKT
PREFILLED_SYRINGE | SUBCUTANEOUS | Status: AC
Start: 2017-08-20 — End: ?
  Filled 2017-08-20: qty 0.6

## 2017-08-20 NOTE — Progress Notes (Signed)
Nutrition follow-up completed with patient during infusion. Weight has improved and was documented as 88 pounds on May 14.  Increased from 82.5 pounds in April. Patient reports appetite has improved and she feels she is eating more. She denies nutrition impact symptoms.  Nutrition diagnosis: Inadequate oral intake has improved.  Intervention: Educated patient to continue small frequent meals and snacks utilizing high-calorie high-protein foods. Recommend patient continue oral nutrition supplements. Teach back method used.  Monitoring, evaluation, goals: Patient will tolerate increased calories and protein for weight maintenance.  Next visit: Tuesday June 4, in infusion.  **Disclaimer: This note was dictated with voice recognition software. Similar sounding words can inadvertently be transcribed and this note may contain transcription errors which may not have been corrected upon publication of note.**

## 2017-08-20 NOTE — Patient Instructions (Signed)
[place] Discharge Instructions for Patients Receiving Chemotherapy  Today you received the following chemotherapy agents:  Etooposide (VP 16)  To help prevent nausea and vomiting after your treatment, we encourage you to take your nausea medication as prescribed.   If you develop nausea and vomiting that is not controlled by your nausea medication, call the clinic.   BELOW ARE SYMPTOMS THAT SHOULD BE REPORTED IMMEDIATELY:  *FEVER GREATER THAN 100.5 F  *CHILLS WITH OR WITHOUT FEVER  NAUSEA AND VOMITING THAT IS NOT CONTROLLED WITH YOUR NAUSEA MEDICATION  *UNUSUAL SHORTNESS OF BREATH  *UNUSUAL BRUISING OR BLEEDING  TENDERNESS IN MOUTH AND THROAT WITH OR WITHOUT PRESENCE OF ULCERS  *URINARY PROBLEMS  *BOWEL PROBLEMS  UNUSUAL RASH Items with * indicate a potential emergency and should be followed up as soon as possible.  Feel free to call the clinic should you have any questions or concerns. The clinic phone number is [phone].  Please show the [place] at check-in to the Emergency Department and triage nurse. Pegfilgrastim injection What is this medicine? PEGFILGRASTIM (PEG fil gra stim) is a long-acting granulocyte colony-stimulating factor that stimulates the growth of neutrophils, a type of white blood cell important in the body's fight against infection. It is used to reduce the incidence of fever and infection in patients with certain types of cancer who are receiving chemotherapy that affects the bone marrow, and to increase survival after being exposed to high doses of radiation. This medicine may be used for other purposes; ask your health care provider or pharmacist if you have questions. COMMON BRAND NAME(S): Neulasta What should I tell my health care provider before I take this medicine? They need to know if you have any of these conditions: -kidney disease -latex allergy -ongoing radiation therapy -sickle cell disease -skin reactions  to acrylic adhesives (On-Body Injector only) -an unusual or allergic reaction to pegfilgrastim, filgrastim, other medicines, foods, dyes, or preservatives -pregnant or trying to get pregnant -breast-feeding How should I use this medicine? This medicine is for injection under the skin. If you get this medicine at home, you will be taught how to prepare and give the pre-filled syringe or how to use the On-body Injector. Refer to the patient Instructions for Use for detailed instructions. Use exactly as directed. Tell your healthcare provider immediately if you suspect that the On-body Injector may not have performed as intended or if you suspect the use of the On-body Injector resulted in a missed or partial dose. It is important that you put your used needles and syringes in a special sharps container. Do not put them in a trash can. If you do not have a sharps container, call your pharmacist or healthcare provider to get one. Talk to your pediatrician regarding the use of this medicine in children. While this drug may be prescribed for selected conditions, precautions do apply. Overdosage: If you think you have taken too much of this medicine contact a poison control center or emergency room at once. NOTE: This medicine is only for you. Do not share this medicine with others. What if I miss a dose? It is important not to miss your dose. Call your doctor or health care professional if you miss your dose. If you miss a dose due to an On-body Injector failure or leakage, a new dose should be administered as soon as possible using a single prefilled syringe for manual use. What may interact with this medicine? Interactions have not been studied. Give  your health care provider a list of all the medicines, herbs, non-prescription drugs, or dietary supplements you use. Also tell them if you smoke, drink alcohol, or use illegal drugs. Some items may interact with your medicine. This list may not describe all  possible interactions. Give your health care provider a list of all the medicines, herbs, non-prescription drugs, or dietary supplements you use. Also tell them if you smoke, drink alcohol, or use illegal drugs. Some items may interact with your medicine. What should I watch for while using this medicine? You may need blood work done while you are taking this medicine. If you are going to need a MRI, CT scan, or other procedure, tell your doctor that you are using this medicine (On-Body Injector only). What side effects may I notice from receiving this medicine? Side effects that you should report to your doctor or health care professional as soon as possible: -allergic reactions like skin rash, itching or hives, swelling of the face, lips, or tongue -dizziness -fever -pain, redness, or irritation at site where injected -pinpoint red spots on the skin -red or dark-brown urine -shortness of breath or breathing problems -stomach or side pain, or pain at the shoulder -swelling -tiredness -trouble passing urine or change in the amount of urine Side effects that usually do not require medical attention (report to your doctor or health care professional if they continue or are bothersome): -bone pain -muscle pain This list may not describe all possible side effects. Call your doctor for medical advice about side effects. You may report side effects to FDA at 1-800-FDA-1088. Where should I keep my medicine? Keep out of the reach of children. Store pre-filled syringes in a refrigerator between 2 and 8 degrees C (36 and 46 degrees F). Do not freeze. Keep in carton to protect from light. Throw away this medicine if it is left out of the refrigerator for more than 48 hours. Throw away any unused medicine after the expiration date. NOTE: This sheet is a summary. It may not cover all possible information. If you have questions about this medicine, talk to your doctor, pharmacist, or health care  provider.  2018 Elsevier/Gold Standard (2016-03-21 12:58:03)

## 2017-08-21 ENCOUNTER — Ambulatory Visit (INDEPENDENT_AMBULATORY_CARE_PROVIDER_SITE_OTHER): Payer: Medicare Other | Admitting: Internal Medicine

## 2017-08-21 ENCOUNTER — Encounter: Payer: Self-pay | Admitting: Internal Medicine

## 2017-08-21 VITALS — BP 140/64 | HR 80 | Ht 61.0 in | Wt 84.6 lb

## 2017-08-21 DIAGNOSIS — J449 Chronic obstructive pulmonary disease, unspecified: Secondary | ICD-10-CM | POA: Diagnosis not present

## 2017-08-21 DIAGNOSIS — F17210 Nicotine dependence, cigarettes, uncomplicated: Secondary | ICD-10-CM | POA: Diagnosis not present

## 2017-08-21 NOTE — Patient Instructions (Addendum)
For breathing ok to use duoneb up to everh 4-6 hours as needed  For cough/ congestion ok to use mucinex 1200 mg every 12 hours as needed    Call if breathing or cough get worse and you want to try stronger medications     Remember The key is to stop smoking completely before smoking completely stops you  - Good luck on this !!!  Pulmonary follow up is as needed

## 2017-08-21 NOTE — Progress Notes (Signed)
Subjective:     Patient ID: Katherine Cole, female   DOB: 1940/06/12,    MRN: [id number]  HPI  66 yowf still smokes but "only after eating"  s/p admit   Admission date:  06/30/2017   Discharge Date:  07/08/2017    Discharge Diagnosis  Dehydration [E86.0] Atypical pneumonia [J18.9] Malignant neoplasm of overlapping sites of lung, unspecified laterality ([place]) [C34.80]     Small cell lung cancer, left ([place])   Uncontrolled type 2 DM with peripheral circulatory disorder (HCC)   Hyperlipidemia   CAD (coronary artery disease)   Tobacco abuse   Hypertension   Acute CVA (cerebrovascular accident) ([place])   CAP (community acquired pneumonia)   Malnutrition of moderate degree      Katherine Cole:WJXB is a 77 y/o female who has an extensive h/o tobacco abuse. She presents with c/o nausea, vomiting and diarrhea for over 3 weeks. She also has had over a 20 pound weight lost over the past 6 months. She presents with productive cough, SOB and wheeze. She denies fever but reports chills. In the ER CT scan confirms pneumonia and possible lung cancer.    Brief summary  admitted on 07/01/2017 with shortness of breath, cough and weight loss and found to have central mass with lympyhadenopathy, likely mucus plugging vs bronchial mass and a peripheral based area of consolidation or mass in the left upper lobe,Had endobronchial ultrasound-guided needle aspiration of the hilar mass and fiberoptic bronchoscopy on 07/04/2017, pathology consistent with small cell lung cancer  Procedures- Flexible video fiberoptic bronchoscopy with endobronchial ultrasound and biopsies on 07/04/17       1st Chemo April 16-18 and 2nd was May 13-16 and no  RT     08/21/2017 1st [facility] office visit/ Katherine Cole : hosp f/u and transition of care  Chief Complaint  Patient presents with  . Hospitalization Follow-up    Breathing is overall doing well. She is not coughing much.  She is using her duoneb 2 x daily on average.   baseline   very active / houseworks and step were fine and no need for resp rx  Last neb 5 h prior to OV and using bid not prn not sure she really benefits. No cough or noct symptoms / 02 need    Not obvious day to day or daytime variability or assoc excess/ purulent sputum or mucus plugs or hemoptysis or cp or chest tightness, subjective wheeze or overt sinus or hb symptoms. No unusual exposure hx or h/o childhood pna/ asthma or knowledge of premature birth.  Sleeping  Fine on side p xanax   without nocturnal  or early am exacerbation  of respiratory  c/o's or need for noct saba. Also denies any obvious fluctuation of symptoms with weather or environmental changes or other aggravating or alleviating factors except as outlined above   Current Allergies, Complete Past Medical History, Past Surgical History, Family History, and Social History were reviewed in [employer] record.  ROS  The following are not active complaints unless bolded Hoarseness, sore throat, dysphagia, dental problems, itching, sneezing,  nasal congestion or discharge of excess mucus or purulent secretions, ear ache,   fever, chills, sweats, unintended wt loss or wt gain, classically pleuritic or exertional cp,  orthopnea pnd or arm/hand swelling  or leg swelling, presyncope, palpitations, abdominal pain, anorexia, nausea, vomiting, diarrhea  or change in bowel habits or change in bladder habits, change in stools or change in urine, dysuria, hematuria,  rash, arthralgias, visual  complaints, headache, numbness, weakness or ataxia or problems with walking or coordination,  change in mood or  memory.        Current Meds  Medication Sig  . ALPRAZolam (XANAX) 0.5 MG tablet Take 1 tablet (0.5 mg total) by mouth at bedtime as needed for anxiety or sleep. (Patient taking differently: Take 0.5 mg by mouth at bedtime. )  . amLODipine (NORVASC) 10 MG tablet Take 1 tablet (10 mg total) by mouth daily.  Marland Kitchen aspirin 81 MG tablet  Take 1 tablet (81 mg total) by mouth daily.  Marland Kitchen atorvastatin (LIPITOR) 80 MG tablet Take 80 mg by mouth daily.  . clopidogrel (PLAVIX) 75 MG tablet Take 75 mg by mouth daily.  . fluticasone (FLONASE) 50 MCG/ACT nasal spray Place 1 spray into both nostrils daily.  Marland Kitchen guaiFENesin (MUCINEX) 600 MG 12 hr tablet Take 2 tablets (1,200 mg total) by mouth 2 (two) times daily as needed (Use for one week post discharge, then twice daily as needed). (Patient taking differently: Take 1,200 mg by mouth 2 (two) times daily as needed for cough (Use for one week post discharge, then twice daily as needed). )  . HUMALOG KWIKPEN 100 UNIT/ML KiwkPen 0-8 units three times daily after meals  . ipratropium-albuterol (DUONEB) 0.5-2.5 (3) MG/3ML SOLN Take 3 mLs by nebulization every 6 (six) hours as needed (Shortness of breath or wheezing).  . lactose free nutrition (BOOST PLUS) LIQD Take 237 mLs by mouth 3 (three) times daily between meals.  Marland Kitchen loratadine (CLARITIN) 10 MG tablet Take 10 mg by mouth daily.  . metFORMIN (GLUCOPHAGE) 1000 MG tablet Take 1,000 mg by mouth 2 (two) times daily with a meal.  . metoprolol succinate (TOPROL-XL) 50 MG 24 hr tablet Take 50 mg by mouth daily. Take with or immediately following a meal.  . mirtazapine (REMERON SOL-TAB) 15 MG disintegrating tablet ONE BY MOUTH DAILY AT BEDTIME  . nitroGLYCERIN (NITROSTAT) 0.4 MG SL tablet Place 1 tablet (0.4 mg total) under the tongue every 5 (five) minutes as needed (up to 3 doses). For chest pain  . ondansetron (ZOFRAN ODT) 4 MG disintegrating tablet Take 1 tablet (4 mg total) by mouth every 8 (eight) hours as needed for nausea or vomiting.  . prochlorperazine (COMPAZINE) 10 MG tablet Take 1 tablet (10 mg total) by mouth every 6 (six) hours as needed for nausea or vomiting.  . sitaGLIPtin (JANUVIA) 100 MG tablet Take 0.5 tablets (50 mg total) by mouth daily.  . sodium chloride (OCEAN) 0.65 % SOLN nasal spray Place 1 spray into both nostrils as needed for  congestion (use twice daily for one week post discharge, then as needed).        Review of Systems     Objective:   Physical Exam       amb thin stoic wf and   Wt Readings from Last 3 Encounters:  08/21/17 84 lb 9.6 oz (38.4 kg)  08/19/17 88 lb 5 oz (40.1 kg)  08/15/17 83 lb (37.6 kg)     Vital signs reviewed - Note on arrival 02 sats  95% on RA      HEENT: nl oropharynx. Nl external ear canals without cough reflex - mild bilateral non-specific turbinate edema /edentulous     NECK :  without JVD/Nodes/TM/ nl carotid upstrokes bilaterally   LUNGS: no acc muscle use,  Mod barrel  contour chest wall with bilateral  Distant bs s audible wheeze and  without cough on insp or exp maneuver  and mild    Hyperresonant  to  percussion bilaterally     CV:  RRR  no s3 or murmur or increase in P2, and no edema   ABD:  soft and nontender with pos late  insp Hoover's  in the supine position. No bruits or organomegaly appreciated, bowel sounds nl  MS:   Nl gait/  ext warm without deformities, calf tenderness, cyanosis or clubbing No obvious joint restrictions   SKIN: warm and dry without lesions    NEURO:  alert, approp, nl sensorium with  no motor or cerebellar deficits apparent.      I personally reviewed images and agree with radiology impression as follows:  CXR:   08/14/17 Right pectoral Port-A-Cath with tip at the cavoatrial junction. Diffuse interstitial coarsening and bronchitic changes. There is no focal consolidation, pleural effusion, or pneumothorax. The cardiac silhouette is within normal limits. Coronary stents noted. There is osteopenia with degenerative changes of the spine> no acute changes      Assessment:

## 2017-08-22 ENCOUNTER — Ambulatory Visit: Payer: Medicare Other

## 2017-08-22 ENCOUNTER — Other Ambulatory Visit: Payer: Medicare Other

## 2017-08-22 ENCOUNTER — Ambulatory Visit: Payer: Medicare Other | Admitting: Oncology

## 2017-08-22 ENCOUNTER — Encounter: Payer: Self-pay | Admitting: Internal Medicine

## 2017-08-22 NOTE — Assessment & Plan Note (Addendum)
4-5 min discussion re active cigarette smoking in addition to office E&M  Ask about tobacco use:  Ongoing p meals Advise quitting  See below Assess willingness not yet  Assist in quit attempt  Per pcp when ready Arrange follow up. Per pcp/oncology    Strongly advised that even casual smoking related to meals can affect mc function and contribute to the rattling cough she still has   Also I  reviewed the Fletcher curve with the patient that basically indicates  if you quit smoking when your best day FEV1 is still well preserved (as is clearly  the case here)  it is highly unlikely you will progress to severe disease and informed the patient there was  no medication on the market that has proven to alter the curve/ its downward trajectory  or the likelihood of progression of their disease(unlike other chronic medical conditions such as atheroclerosis where we do think we can change the natural hx with risk reducing meds)    Therefore stopping smoking and maintaining abstinence are  the most important aspects of care, not choice of inhalers or for that matter, doctors.   Treatment other than smoking cessation  is entirely directed by severity of symptoms and focused also on reducing exacerbations, not attempting to change the natural history of the disease.

## 2017-08-22 NOTE — Assessment & Plan Note (Signed)
Spirometry 08/21/2017  FEV1 1.29 (69%)  Ratio 69 with abn effort dep portion and min curvature on the effort indep portion x   5 h p duoneb   As I explained to this patient in detail:  although there probably is some copd present, it may not be clinically relevant:   it does not appear to be limiting activity tolerance any more than a set of worn tires limits someone from driving a car  around a parking lot.  A new set of Michelins might look good but would have no perceived impact on the performance of the car and would not be worth the cost.  That is to say:   this pt is so sedentary I don't recommend aggressive pulmonary rx at this point unless limiting symptoms arise or acute exacerbations become as issue, neither of which is the case now.  I asked the patient to contact this office at any time in the future should either of these problems arise.    Pulmonary f/u can be prn / f/u with oncology planned    I had an extended discussion with the patient reviewing all relevant studies completed to date and  lasting 25 minutes of a 40  minute transion of care office visit with pt new to me     re   non-specific but potentially   serious refractory respiratory symptoms of uncertain and potentially multiple  etiologies.   Each maintenance medication was reviewed in detail including most importantly the difference between maintenance and prns and under what circumstances the prns are to be triggered using an action plan format that is not reflected in the computer generated alphabetically organized AVS.    Please see AVS for specific instructions unique to this office visit that I personally wrote and verbalized to the the pt in detail and then reviewed with pt  by my nurse highlighting any changes in therapy/plan of care  recommended at today's visit.

## 2017-08-25 ENCOUNTER — Other Ambulatory Visit: Payer: Self-pay | Admitting: Medical Oncology

## 2017-08-25 ENCOUNTER — Inpatient Hospital Stay: Payer: Medicare Other

## 2017-08-25 DIAGNOSIS — C3412 Malignant neoplasm of upper lobe, left bronchus or lung: Secondary | ICD-10-CM

## 2017-08-25 DIAGNOSIS — C3492 Malignant neoplasm of unspecified part of left bronchus or lung: Secondary | ICD-10-CM

## 2017-08-25 DIAGNOSIS — Z95828 Presence of other vascular implants and grafts: Secondary | ICD-10-CM

## 2017-08-25 DIAGNOSIS — Z5111 Encounter for antineoplastic chemotherapy: Secondary | ICD-10-CM | POA: Diagnosis not present

## 2017-08-25 LAB — CMP (CANCER CENTER ONLY)
ALT: 23 U/L (ref 0–55)
AST: 18 U/L (ref 5–34)
Albumin: 3.1 g/dL — ABNORMAL LOW (ref 3.5–5.0)
Alkaline Phosphatase: 170 U/L — ABNORMAL HIGH (ref 40–150)
Anion gap: 7 (ref 3–11)
BILIRUBIN TOTAL: 0.4 mg/dL (ref 0.2–1.2)
BUN: 31 mg/dL — AB (ref 7–26)
CO2: 26 mmol/L (ref 22–29)
Calcium: 8.1 mg/dL — ABNORMAL LOW (ref 8.4–10.4)
Chloride: 103 mmol/L (ref 98–109)
Creatinine: 0.8 mg/dL (ref 0.60–1.10)
GFR, Est AFR Am: >60 mL/min (ref >60)
Glucose, Bld: 148 mg/dL — ABNORMAL HIGH (ref 70–140)
Potassium: 4.2 mmol/L (ref 3.5–5.1)
Sodium: 136 mmol/L (ref 136–145)
TOTAL PROTEIN: 6.5 g/dL (ref 6.4–8.3)

## 2017-08-25 LAB — CBC WITH DIFFERENTIAL (CANCER CENTER ONLY)
BASOS ABS: 0.1 10*3/uL (ref 0.0–0.1)
BASOS PCT: 0 %
EOS PCT: 0 %
Eosinophils Absolute: 0 10*3/uL (ref 0.0–0.5)
HCT: 27.4 % — ABNORMAL LOW (ref 34.8–46.6)
Hemoglobin: 9 g/dL — ABNORMAL LOW (ref 11.6–15.9)
Lymphocytes Relative: 10 %
Lymphs Abs: 2.5 10*3/uL (ref 0.9–3.3)
MCH: 29.8 pg (ref 25.1–34.0)
MCHC: 32.8 g/dL (ref 31.5–36.0)
MCV: 90.7 fL (ref 79.5–101.0)
Monocytes Absolute: 0.5 10*3/uL (ref 0.1–0.9)
Monocytes Relative: 2 %
Neutro Abs: 21.3 10*3/uL — ABNORMAL HIGH (ref 1.5–6.5)
Neutrophils Relative %: 88 %
PLATELETS: 360 10*3/uL (ref 145–400)
RBC: 3.02 MIL/uL — AB (ref 3.70–5.45)
RDW: 16.2 % — ABNORMAL HIGH (ref 11.2–14.5)
WBC: 24.4 10*3/uL — AB (ref 3.9–10.3)

## 2017-08-25 MED ORDER — SODIUM CHLORIDE 0.9% FLUSH
10.0000 mL | Freq: Once | INTRAVENOUS | Status: AC
  Administered 2017-08-25: 10 mL
  Filled 2017-08-25: qty 10

## 2017-08-25 MED ORDER — HEPARIN SOD (PORK) LOCK FLUSH 100 UNIT/ML IV SOLN
250.0000 [IU] | Freq: Once | INTRAVENOUS | Status: AC
  Administered 2017-08-25: 3 [IU]
  Filled 2017-08-25: qty 5

## 2017-08-26 ENCOUNTER — Emergency Department (HOSPITAL_COMMUNITY): Payer: Medicare Other

## 2017-08-26 ENCOUNTER — Other Ambulatory Visit: Payer: Self-pay

## 2017-08-26 ENCOUNTER — Emergency Department (HOSPITAL_COMMUNITY)
Admission: EM | Admit: 2017-08-26 | Discharge: 2017-08-27 | Disposition: A | Discharge status: Home / Self Care | Payer: Medicare Other | Attending: Emergency Medicine | Admitting: Emergency Medicine

## 2017-08-26 ENCOUNTER — Encounter (HOSPITAL_COMMUNITY): Payer: Self-pay

## 2017-08-26 DIAGNOSIS — F17210 Nicotine dependence, cigarettes, uncomplicated: Secondary | ICD-10-CM | POA: Diagnosis not present

## 2017-08-26 DIAGNOSIS — Z955 Presence of coronary angioplasty implant and graft: Secondary | ICD-10-CM | POA: Insufficient documentation

## 2017-08-26 DIAGNOSIS — I2510 Atherosclerotic heart disease of native coronary artery without angina pectoris: Secondary | ICD-10-CM | POA: Insufficient documentation

## 2017-08-26 DIAGNOSIS — Z7902 Long term (current) use of antithrombotics/antiplatelets: Secondary | ICD-10-CM | POA: Insufficient documentation

## 2017-08-26 DIAGNOSIS — Z85118 Personal history of other malignant neoplasm of bronchus and lung: Secondary | ICD-10-CM | POA: Insufficient documentation

## 2017-08-26 DIAGNOSIS — Z7984 Long term (current) use of oral hypoglycemic drugs: Secondary | ICD-10-CM | POA: Diagnosis not present

## 2017-08-26 DIAGNOSIS — Z7982 Long term (current) use of aspirin: Secondary | ICD-10-CM | POA: Insufficient documentation

## 2017-08-26 DIAGNOSIS — Z9221 Personal history of antineoplastic chemotherapy: Secondary | ICD-10-CM | POA: Diagnosis not present

## 2017-08-26 DIAGNOSIS — E119 Type 2 diabetes mellitus without complications: Secondary | ICD-10-CM | POA: Insufficient documentation

## 2017-08-26 DIAGNOSIS — R509 Fever, unspecified: Secondary | ICD-10-CM | POA: Diagnosis not present

## 2017-08-26 DIAGNOSIS — R1111 Vomiting without nausea: Secondary | ICD-10-CM | POA: Diagnosis present

## 2017-08-26 DIAGNOSIS — R531 Weakness: Secondary | ICD-10-CM | POA: Diagnosis not present

## 2017-08-26 DIAGNOSIS — Z85828 Personal history of other malignant neoplasm of skin: Secondary | ICD-10-CM | POA: Insufficient documentation

## 2017-08-26 DIAGNOSIS — I10 Essential (primary) hypertension: Secondary | ICD-10-CM | POA: Diagnosis not present

## 2017-08-26 DIAGNOSIS — Z79899 Other long term (current) drug therapy: Secondary | ICD-10-CM | POA: Insufficient documentation

## 2017-08-26 DIAGNOSIS — J449 Chronic obstructive pulmonary disease, unspecified: Secondary | ICD-10-CM | POA: Diagnosis not present

## 2017-08-26 DIAGNOSIS — E860 Dehydration: Secondary | ICD-10-CM

## 2017-08-26 DIAGNOSIS — Z8673 Personal history of transient ischemic attack (TIA), and cerebral infarction without residual deficits: Secondary | ICD-10-CM | POA: Insufficient documentation

## 2017-08-26 NOTE — ED Notes (Signed)
Bed: IF12 Expected date:  Expected time:  Means of arrival:  Comments: Triage 5

## 2017-08-26 NOTE — ED Triage Notes (Signed)
Patient brought in by daughter-daughter states she had chemo last Monday through Thursday. Daughter states that was her second round of chemo. Daughter states her mother had vomiting this past Saturday and she administered Compazine. Daughter states that her Mother started running a fever since 1830-states up to 101.6 by digital mouth thermometer-states saw primary yesterday-daughter states "all blood work looked better". Patient vomited today at lunch and did not tell daughter-daughter states patient has been weak and "lethargic" and has been sleeping a lot. Patient states nothing hurts, she just doesn't feel good. Patient states she is very weak and having difficulty standing and walking.

## 2017-08-27 ENCOUNTER — Other Ambulatory Visit: Payer: Self-pay

## 2017-08-27 DIAGNOSIS — R1111 Vomiting without nausea: Secondary | ICD-10-CM | POA: Diagnosis not present

## 2017-08-27 LAB — CBC WITH DIFFERENTIAL/PLATELET
Basophils Absolute: 0.1 10*3/uL (ref 0.0–0.1)
Basophils Relative: 1 %
EOS PCT: 0 %
Eosinophils Absolute: 0 10*3/uL (ref 0.0–0.7)
HEMATOCRIT: 24.4 % — AB (ref 36.0–46.0)
Hemoglobin: 8.3 g/dL — ABNORMAL LOW (ref 12.0–15.0)
LYMPHS ABS: 1.7 10*3/uL (ref 0.7–4.0)
LYMPHS PCT: 20 %
MCH: 30.5 pg (ref 26.0–34.0)
MCHC: 34 g/dL (ref 30.0–36.0)
MCV: 89.7 fL (ref 78.0–100.0)
MONOS PCT: 10 %
Monocytes Absolute: 0.9 10*3/uL (ref 0.1–1.0)
Neutro Abs: 5.9 10*3/uL (ref 1.7–7.7)
Neutrophils Relative %: 69 %
Platelets: 269 10*3/uL (ref 150–400)
RBC: 2.72 MIL/uL — AB (ref 3.87–5.11)
RDW: 15.9 % — ABNORMAL HIGH (ref 11.5–15.5)
WBC: 8.6 10*3/uL (ref 4.0–10.5)

## 2017-08-27 LAB — COMPREHENSIVE METABOLIC PANEL
ALBUMIN: 3.1 g/dL — AB (ref 3.5–5.0)
ALK PHOS: 122 U/L (ref 38–126)
ALT: 21 U/L (ref 14–54)
AST: 18 U/L (ref 15–41)
Anion gap: 10 (ref 5–15)
BUN: 26 mg/dL — ABNORMAL HIGH (ref 6–20)
CALCIUM: 8.6 mg/dL — AB (ref 8.9–10.3)
CHLORIDE: 102 mmol/L (ref 101–111)
CO2: 23 mmol/L (ref 22–32)
CREATININE: 0.75 mg/dL (ref 0.44–1.00)
GFR calc non Af Amer: >60 mL/min (ref >60)
GLUCOSE: 211 mg/dL — AB (ref 65–99)
Potassium: 4 mmol/L (ref 3.5–5.1)
SODIUM: 135 mmol/L (ref 135–145)
Total Bilirubin: 0.5 mg/dL (ref 0.3–1.2)
Total Protein: 6.6 g/dL (ref 6.5–8.1)

## 2017-08-27 LAB — I-STAT CG4 LACTIC ACID, ED: Lactic Acid, Venous: 1.02 mmol/L (ref 0.5–1.9)

## 2017-08-27 LAB — PROTIME-INR
INR: 0.93
Prothrombin Time: 12.4 seconds (ref 11.4–15.2)

## 2017-08-27 LAB — CBG MONITORING, ED: GLUCOSE-CAPILLARY: 157 mg/dL — AB (ref 65–99)

## 2017-08-27 MED ORDER — PROMETHAZINE HCL 12.5 MG PO TABS: 12.5000 mg | ORAL_TABLET | ORAL | 0 refills | Status: AC | PRN

## 2017-08-27 MED ORDER — HEPARIN SOD (PORK) LOCK FLUSH 100 UNIT/ML IV SOLN
500.0000 [IU] | Freq: Once | INTRAVENOUS | Status: AC
  Administered 2017-08-27: 500 [IU]
  Filled 2017-08-27: qty 5

## 2017-08-27 MED ORDER — PROMETHAZINE HCL 25 MG/ML IJ SOLN
12.5000 mg | Freq: Once | INTRAMUSCULAR | Status: AC
  Administered 2017-08-27: 12.5 mg via INTRAVENOUS
  Filled 2017-08-27: qty 1

## 2017-08-27 MED ORDER — SODIUM CHLORIDE 0.9 % IV BOLUS
1000.0000 mL | Freq: Once | INTRAVENOUS | Status: AC
Start: 2017-08-27 — End: 2017-08-27
  Administered 2017-08-27: 1000 mL via INTRAVENOUS

## 2017-08-27 NOTE — Discharge Instructions (Addendum)
You were seen today for nausea, vomiting, and fever.  You appear dehydrated.  Your infectious work-up is largely reassuring.  Blood cultures are pending.  Your nausea and vomiting may be related to ongoing chemotherapy.  You will be given a prescription for acute Phenergan.  Do not take this in combination with Compazine as it can make you very sleepy.  Follow-up with your oncologist closely.

## 2017-08-27 NOTE — ED Provider Notes (Signed)
[facility] DEPT Provider Note   CSN: [id number] Arrival date & time: 08/26/17  2246     History   Chief Complaint Chief Complaint  Patient presents with  . Code Sepsis    HPI Katherine Cole is a 77 y.o. female.  HPI  This is a 77 year old female with a history of lung cancer undergoing chemotherapy, coronary artery disease, diabetes who presents with generalized weakness, fever, vomiting.  Daughter reports that she had her second round of chemotherapy last week.  On Saturday she developed vomiting.  She has been giving her Compazine.  She was seen in clinic on Monday and had "reassuring lab work."  However, today daughter noted that she had recurrent emesis after eating lunch.  She has had multiple episodes of nonbilious, nonbloody emesis.  She reports normal bowel movements.  She denies any abdominal pain.  She does report generalized weakness.  No urinary symptoms.  Daughter states that her temperature has gradually increased at home initially it was 99.8 and 100.2 and then 101.2.  She has not taken anything for her temperature.  They called cancer clinic and she was referred here.  Patient denies any focal weakness or numbness.  Past Medical History:  Diagnosis Date  . Arthritis    "in my fingers"   . CAD (coronary artery disease)    a. MI 1999 with 2 stents at that time followed by stenting 3-4 yrs later. b. s/p DES to LAD guided by pressure wire 03/11/12.  . Colon polyps    adenomatous  . Depression   . Diabetes mellitus, type 2 ([place])   . Hyperlipemia   . Hypertension   . Osteoarthritis   . Pneumonia 06/2017  . Skin cancer 2012   "nose; right middle finger"  . Stroke [facility])     Patient Active Problem List   Diagnosis Date Noted  . COPD II still smoking  08/21/2017  . Dehydration 08/15/2017  . Antineoplastic chemotherapy induced pancytopenia ([place]) 08/03/2017  . Pancytopenia ([place]) 08/03/2017  . DNR (do not resuscitate) 07/29/2017  .  Port-A-Cath in place 07/22/2017  . Encounter for antineoplastic chemotherapy 07/12/2017  . Goals of care, counseling/discussion 07/12/2017  . Moderate protein-calorie malnutrition ([place]) 07/12/2017  . Encounter for smoking cessation counseling 07/12/2017  . 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 Primary malignant neoplasm of left upper lobe of lung ([place]) 07/08/2017  . Atypical pneumonia   . Acute CVA (cerebrovascular accident) ([place]) 01/20/2014  . Exertional angina ([place]) 03/12/2012  . CAD (coronary artery disease) 02/10/2012  . Cigarette smoker 02/10/2012  . Essential hypertension 02/10/2012  . Benign neoplasm of colon 08/01/2007  . Type 2 diabetes mellitus with hypoglycemia without coma ([place]) 08/01/2007  . Hyperlipidemia 08/01/2007  . GASTROESOPHAGEAL REFLUX DISEASE 08/01/2007  . OSTEOARTHRITIS 08/01/2007    Past Surgical History:  Procedure Laterality Date  . APPENDECTOMY  ~ 1965  . CATARACT EXTRACTION    . CORONARY ANGIOPLASTY WITH STENT PLACEMENT  1999; 2004; 03/11/2012   "2 + 1 + 1; total of 4" (03/11/2012)  . FEMUR FRACTURE SURGERY  2011   RLE (03/11/2012)  . IR FLUORO GUIDE PORT INSERTION RIGHT  07/18/2017  . IR US GUIDE VASC ACCESS RIGHT  07/18/2017  . PERCUTANEOUS CORONARY STENT INTERVENTION (PCI-S) N/A 03/11/2012   Procedure: PERCUTANEOUS CORONARY STENT INTERVENTION (PCI-S);  Surgeon: Sherren Mocha, MD;  Location: [facility] CATH LAB;  Service: Cardiovascular;  Laterality: N/A;  . SKIN CANCER EXCISION  2013   "  nose & right middle finger; actinic keratosis" (03/11/2012)  . TONSILLECTOMY AND ADENOIDECTOMY  ~ 1962  . TUBAL LIGATION  1977  . VIDEO BRONCHOSCOPY WITH ENDOBRONCHIAL ULTRASOUND N/A 07/04/2017   Procedure: VIDEO BRONCHOSCOPY WITH ENDOBRONCHIAL ULTRASOUND;  Surgeon: Collene Gobble, MD;  Location: [facility] OR;  Service: Thoracic;  Laterality: N/A;     OB History   None      Home Medications    Prior to Admission medications   Medication Sig Start Date  End Date Taking? Authorizing Provider  amLODipine (NORVASC) 10 MG tablet Take 1 tablet (10 mg total) by mouth daily. 08/11/17  Yes Kayleen Memos, DO  aspirin 81 MG tablet Take 1 tablet (81 mg total) by mouth daily. Patient taking differently: Take 81 mg by mouth as directed. On mondays and Thursdays 01/21/14  Yes Annita Brod, MD  atorvastatin (LIPITOR) 80 MG tablet Take 80 mg by mouth daily.   Yes [provider]  clopidogrel (PLAVIX) 75 MG tablet Take 75 mg by mouth daily.   Yes [provider]  fluticasone (FLONASE) 50 MCG/ACT nasal spray Place 1 spray into both nostrils daily. 08/09/17  Yes Ollis, Cleaster Corin, NP  guaiFENesin (MUCINEX) 600 MG 12 hr tablet Take 2 tablets (1,200 mg total) by mouth 2 (two) times daily as needed (Use for one week post discharge, then twice daily as needed). Patient taking differently: Take 1,200 mg by mouth 2 (two) times daily as needed for cough (Use for one week post discharge, then twice daily as needed).  08/09/17  Yes Donita Brooks, NP  HUMALOG KWIKPEN 100 UNIT/ML KiwkPen 0-8 units three times daily after meals 07/23/17  Yes [provider]  ipratropium-albuterol (DUONEB) 0.5-2.5 (3) MG/3ML SOLN Take 3 mLs by nebulization every 6 (six) hours as needed (Shortness of breath or wheezing). 08/09/17  Yes Ollis, Brandi L, NP  lactose free nutrition (BOOST PLUS) LIQD Take 237 mLs by mouth 3 (three) times daily between meals. 08/10/17  Yes Hall, Carole N, DO  loratadine (CLARITIN) 10 MG tablet Take 10 mg by mouth daily.   Yes [provider]  LORazepam (ATIVAN) 1 MG tablet Take 0.5 mg by mouth at bedtime. 08/25/17  Yes [provider]  metFORMIN (GLUCOPHAGE) 1000 MG tablet Take 1,000 mg by mouth 2 (two) times daily with a meal.   Yes [provider]  metoprolol succinate (TOPROL-XL) 50 MG 24 hr tablet Take 50 mg by mouth daily. Take with or immediately following a meal.   Yes [provider]  mirtazapine (REMERON  SOL-TAB) 15 MG disintegrating tablet ONE BY MOUTH DAILY AT BEDTIME 08/10/17  Yes [provider]  nitroGLYCERIN (NITROSTAT) 0.4 MG SL tablet Place 1 tablet (0.4 mg total) under the tongue every 5 (five) minutes as needed (up to 3 doses). For chest pain 03/12/12  Yes Dunn, Dayna N, PA-C  ondansetron (ZOFRAN ODT) 4 MG disintegrating tablet Take 1 tablet (4 mg total) by mouth every 8 (eight) hours as needed for nausea or vomiting. 07/08/17  Yes Roxan Hockey, MD  prochlorperazine (COMPAZINE) 10 MG tablet Take 1 tablet (10 mg total) by mouth every 6 (six) hours as needed for nausea or vomiting. 07/22/17  Yes Curt Bears, MD  sitaGLIPtin (JANUVIA) 100 MG tablet Take 0.5 tablets (50 mg total) by mouth daily. 08/10/17  Yes Kayleen Memos, DO  sodium chloride (OCEAN) 0.65 % SOLN nasal spray Place 1 spray into both nostrils as needed for congestion (use twice daily for one  week post discharge, then as needed). 08/09/17  Yes Ollis, Cleaster Corin, NP  ALPRAZolam (XANAX) 0.5 MG tablet Take 1 tablet (0.5 mg total) by mouth at bedtime as needed for anxiety or sleep. Patient not taking: Reported on 08/27/2017 07/08/17 07/08/18  Roxan Hockey, MD  promethazine (PHENERGAN) 12.5 MG tablet Take 1 tablet (12.5 mg total) by mouth every 4 (four) hours as needed for nausea or vomiting. 08/27/17   Gilmer Kaminsky, Barbette Hair, MD    Family History Family History  Problem Relation Age of Onset  . Liver cancer Mother   . Brain cancer Father   . Stomach cancer Father   . Colon cancer Brother 59  . Lung cancer Sister   . Lung cancer Brother   . Stomach cancer Brother   . Diabetes Daughter     Social History Social History   Tobacco Use  . Smoking status: Current Every Day Smoker    Packs/day: 0.50    Years: 51.00    Pack years: 25.50    Types: Cigarettes  . Smokeless tobacco: Never Used  Substance Use Topics  . Alcohol use: No  . Drug use: No     Allergies   Codeine and Morphine and related   Review of  Systems Review of Systems  Constitutional: Positive for fever.  Respiratory: Positive for cough. Negative for shortness of breath.   Cardiovascular: Negative for chest pain.  Gastrointestinal: Positive for nausea and vomiting. Negative for abdominal pain, constipation and diarrhea.  Genitourinary: Negative for dysuria and flank pain.  All other systems reviewed and are negative.    Physical Exam Updated Vital Signs BP 122/62   Pulse 74   Temp (!) 100.5 F (38.1 C) (Rectal)   Resp (!) 23   Ht 5\' 1"  (1.549 m)   Wt 37.8 kg (83 lb 5 oz)   SpO2 98%   BMI 15.74 kg/m   Physical Exam  Constitutional: She is oriented to person, place, and time.  Chronically ill-appearing, thin, frail  HENT:  Head: Normocephalic and atraumatic.  Mucous membranes dry  Eyes: Pupils are equal, round, and reactive to light.  Cardiovascular: Normal rate, regular rhythm and normal heart sounds.  Port right upper chest  Pulmonary/Chest: Effort normal. No respiratory distress. She has no wheezes. She has rales.  Rales bilaterally  Abdominal: Soft. Bowel sounds are normal. There is no tenderness.  Neurological: She is alert and oriented to person, place, and time.  Skin: Skin is warm and dry.  Psychiatric: She has a normal mood and affect.  Nursing note and vitals reviewed.    ED Treatments / Results  Labs (all labs ordered are listed, but only abnormal results are displayed) Labs Reviewed  COMPREHENSIVE METABOLIC PANEL - Abnormal; Notable for the following components:      Result Value   Glucose, Bld 211 (*)    BUN 26 (*)    Calcium 8.6 (*)    Albumin 3.1 (*)    All other components within normal limits  CBC WITH DIFFERENTIAL/PLATELET - Abnormal; Notable for the following components:   RBC 2.72 (*)    Hemoglobin 8.3 (*)    HCT 24.4 (*)    RDW 15.9 (*)    All other components within normal limits  CBG MONITORING, ED - Abnormal; Notable for the following components:   Glucose-Capillary 157  (*)    All other components within normal limits  CULTURE, BLOOD (ROUTINE X 2)  CULTURE, BLOOD (ROUTINE X 2)  PROTIME-INR  URINALYSIS, ROUTINE  W REFLEX MICROSCOPIC  I-STAT CG4 LACTIC ACID, ED    EKG EKG Interpretation  Date/Time:  Wednesday Aug 27 2017 00:19:33 EDT Ventricular Rate:  86 PR Interval:    QRS Duration: 80 QT Interval:  380 QTC Calculation: 455 R Axis:   87 Text Interpretation:  Sinus rhythm Borderline right axis deviation Confirmed by Thayer Jew [phone]) on 08/27/2017 5:00:34 AM   Radiology Dg Chest 2 View  Result Date: 08/26/2017 CLINICAL DATA:  Cancer patient on chemotherapy with fever and lethargy. EXAM: CHEST - 2 VIEW COMPARISON:  08/14/2017 FINDINGS: Heart size is normal. There is moderate aortic atherosclerosis without aneurysmal dilatation. Port catheter tip terminates in the mid to distal SVC. No pulmonary consolidation, effusion or pneumothorax. Minimal atelectasis and/or scarring at the lung bases. Soft tissue calcifications adjacent to the humeral heads bilaterally compatible with calcific rotator cuff tendinopathy. IMPRESSION: Stable appearance of the chest with bibasilar atelectasis. No active pulmonary disease. Aortic atherosclerosis. Electronically Signed   By: Ashley Royalty M.D.   On: 08/26/2017 23:46    Procedures Procedures (including critical care time)  Medications Ordered in ED Medications  heparin lock flush 100 unit/mL (has no administration in time range)  sodium chloride 0.9 % bolus 1,000 mL (0 mLs Intravenous Stopped 08/27/17 0116)  promethazine (PHENERGAN) injection 12.5 mg (12.5 mg Intravenous Given 08/27/17 0117)     Initial Impression / Assessment and Plan / ED Course  I have reviewed the triage vital signs and the nursing notes.  Pertinent labs & imaging results that were available during my care of the patient were reviewed by me and considered in my medical decision making (see chart for details).     Patient presents with  nausea, vomiting, fevers.  Currently undergoing chemotherapy.  She is frail and ill-appearing but nontoxic.  Initial temperature normal.  Rectal temperature 100.5.  Sepsis work-up was initiated given her ongoing chemotherapy and immunosuppressive state.  She is not neutropenic.  She has no significant abdominal tenderness on exam.  Infectious work-up including chest x-ray is reassuring.  Patient was unable to provide a urine sample.  Patient was given Phenergan and rest comfortably.  She was also given fluids.  On multiple rechecks, she is tolerating fluids without difficulty.  Given that she is not neutropenic, do not feel that her temperature warrants admission; however, blood cultures are pending.  I have advised her and her daughter to call oncology this morning to update them on her current symptoms.  Will discharge home with Phenergan for nausea and vomiting.  She was advised not to combine this with Compazine.  After history, exam, and medical workup I feel the patient has been appropriately medically screened and is safe for discharge home. Pertinent diagnoses were discussed with the patient. Patient was given return precautions.   Final Clinical Impressions(s) / ED Diagnoses   Final diagnoses:  Non-intractable vomiting without nausea, unspecified vomiting type  Dehydration  Fever and chills    ED Discharge Orders        Ordered    promethazine (PHENERGAN) 12.5 MG tablet  Every 4 hours PRN     08/27/17 0457       Kitzia Camus, Barbette Hair, MD 08/27/17 0502

## 2017-08-27 NOTE — ED Notes (Signed)
Pt made aware a urine specimen is needed.

## 2017-08-28 ENCOUNTER — Other Ambulatory Visit: Payer: Self-pay | Admitting: *Deleted

## 2017-08-28 ENCOUNTER — Inpatient Hospital Stay (HOSPITAL_BASED_OUTPATIENT_CLINIC_OR_DEPARTMENT_OTHER): Payer: Medicare Other | Admitting: Medical

## 2017-08-28 VITALS — BP 132/68 | HR 83 | Temp 98.4°F | Resp 17 | Ht 61.0 in | Wt 82.3 lb

## 2017-08-28 DIAGNOSIS — C787 Secondary malignant neoplasm of liver and intrahepatic bile duct: Secondary | ICD-10-CM | POA: Diagnosis not present

## 2017-08-28 DIAGNOSIS — J069 Acute upper respiratory infection, unspecified: Secondary | ICD-10-CM | POA: Diagnosis not present

## 2017-08-28 DIAGNOSIS — R509 Fever, unspecified: Secondary | ICD-10-CM | POA: Diagnosis not present

## 2017-08-28 DIAGNOSIS — R112 Nausea with vomiting, unspecified: Secondary | ICD-10-CM | POA: Diagnosis not present

## 2017-08-28 DIAGNOSIS — C3412 Malignant neoplasm of upper lobe, left bronchus or lung: Secondary | ICD-10-CM | POA: Diagnosis not present

## 2017-08-28 DIAGNOSIS — Z720 Tobacco use: Secondary | ICD-10-CM

## 2017-08-28 DIAGNOSIS — C7951 Secondary malignant neoplasm of bone: Secondary | ICD-10-CM | POA: Diagnosis not present

## 2017-08-28 DIAGNOSIS — J449 Chronic obstructive pulmonary disease, unspecified: Secondary | ICD-10-CM | POA: Diagnosis not present

## 2017-08-28 DIAGNOSIS — E860 Dehydration: Secondary | ICD-10-CM | POA: Diagnosis not present

## 2017-08-28 DIAGNOSIS — C778 Secondary and unspecified malignant neoplasm of lymph nodes of multiple regions: Secondary | ICD-10-CM | POA: Diagnosis not present

## 2017-08-28 DIAGNOSIS — R0989 Other specified symptoms and signs involving the circulatory and respiratory systems: Secondary | ICD-10-CM | POA: Diagnosis not present

## 2017-08-28 DIAGNOSIS — Z5111 Encounter for antineoplastic chemotherapy: Secondary | ICD-10-CM | POA: Diagnosis not present

## 2017-08-28 MED ORDER — ALBUTEROL SULFATE (2.5 MG/3ML) 0.083% IN NEBU
INHALATION_SOLUTION | RESPIRATORY_TRACT | Status: AC
Start: 2017-08-28 — End: ?
  Filled 2017-08-28: qty 3

## 2017-08-28 MED ORDER — HEPARIN SOD (PORK) LOCK FLUSH 100 UNIT/ML IV SOLN
500.0000 [IU] | Freq: Once | INTRAVENOUS | Status: AC
  Administered 2017-08-28: 500 [IU] via INTRAVENOUS
  Filled 2017-08-28: qty 5

## 2017-08-28 MED ORDER — ONDANSETRON HCL 4 MG/2ML IJ SOLN
INTRAMUSCULAR | Status: AC
  Filled 2017-08-28: qty 2

## 2017-08-28 MED ORDER — ALBUTEROL SULFATE (2.5 MG/3ML) 0.083% IN NEBU
2.5000 mg | INHALATION_SOLUTION | Freq: Once | RESPIRATORY_TRACT | Status: AC
  Administered 2017-08-28: 2.5 mg via RESPIRATORY_TRACT
  Filled 2017-08-28: qty 3

## 2017-08-28 MED ORDER — SODIUM CHLORIDE 0.9% FLUSH
10.0000 mL | Freq: Once | INTRAVENOUS | Status: AC
  Administered 2017-08-28: 10 mL via INTRAVENOUS
  Filled 2017-08-28: qty 10

## 2017-08-28 MED ORDER — ONDANSETRON HCL 4 MG/2ML IJ SOLN
4.0000 mg | Freq: Once | INTRAMUSCULAR | Status: AC
  Administered 2017-08-28: 4 mg via INTRAVENOUS

## 2017-08-28 MED ORDER — SULFAMETHOXAZOLE-TRIMETHOPRIM 800-160 MG PO TABS: 1.0000 | ORAL_TABLET | Freq: Two times a day (BID) | ORAL | 0 refills | Status: DC

## 2017-08-28 MED ORDER — SODIUM CHLORIDE 0.9 % IV SOLN
INTRAVENOUS | Status: DC
  Administered 2017-08-28: 14:00:00 via INTRAVENOUS

## 2017-08-28 MED ORDER — ONDANSETRON 4 MG PO TBDP: 4.0000 mg | ORAL_TABLET | Freq: Three times a day (TID) | ORAL | 3 refills | Status: AC | PRN

## 2017-08-28 NOTE — Patient Instructions (Addendum)
Dehydration, Adult Dehydration is when there is not enough fluid or water in your body. This happens when you lose more fluids than you take in. Dehydration can range from mild to very bad. It should be treated right away to keep it from getting very bad. Symptoms of mild dehydration may include:  Thirst.  Dry lips.  Slightly dry mouth.  Dry, warm skin.  Dizziness. Symptoms of moderate dehydration may include:  Very dry mouth.  Muscle cramps.  Dark pee (urine). Pee may be the color of tea.  Your body making less pee.  Your eyes making fewer tears.  Heartbeat that is uneven or faster than normal (palpitations).  Headache.  Light-headedness, especially when you stand up from sitting.  Fainting (syncope). Symptoms of very bad dehydration may include:  Changes in skin, such as: ? Cold and clammy skin. ? Blotchy (mottled) or pale skin. ? Skin that does not quickly return to normal after being lightly pinched and let go (poor skin turgor).  Changes in body fluids, such as: ? Feeling very thirsty. ? Your eyes making fewer tears. ? Not sweating when body temperature is high, such as in hot weather. ? Your body making very little pee.  Changes in vital signs, such as: ? Weak pulse. ? Pulse that is more than 100 beats a minute when you are sitting still. ? Fast breathing. ? Low blood pressure.  Other changes, such as: ? Sunken eyes. ? Cold hands and feet. ? Confusion. ? Lack of energy (lethargy). ? Trouble waking up from sleep. ? Short-term weight loss. ? Unconsciousness. Follow these instructions at home:  If told by your doctor, drink an ORS: ? Make an ORS by using instructions on the package. ? Start by drinking small amounts, about  cup (120 mL) every 5-10 minutes. ? Slowly drink more until you have had the amount that your doctor said to have.  Drink enough clear fluid to keep your pee clear or pale yellow. If you were told to drink an ORS, finish the ORS  first, then start slowly drinking clear fluids. Drink fluids such as: ? Water. Do not drink only water by itself. Doing that can make the salt (sodium) level in your body get too low (hyponatremia). ? Ice chips. ? Fruit juice that you have added water to (diluted). ? Low-calorie sports drinks.  Avoid: ? Alcohol. ? Drinks that have a lot of sugar. These include high-calorie sports drinks, fruit juice that does not have water added, and soda. ? Caffeine. ? Foods that are greasy or have a lot of fat or sugar.  Take over-the-counter and prescription medicines only as told by your doctor.  Do not take salt tablets. Doing that can make the salt level in your body get too high (hypernatremia).  Eat foods that have minerals (electrolytes). Examples include bananas, oranges, potatoes, tomatoes, and spinach.  Keep all follow-up visits as told by your doctor. This is important. Contact a doctor if:  You have belly (abdominal) pain that: ? Gets worse. ? Stays in one area (localizes).  You have a rash.  You have a stiff neck.  You get angry or annoyed more easily than normal (irritability).  You are more sleepy than normal.  You have a harder time waking up than normal.  You feel: ? Weak. ? Dizzy. ? Very thirsty.  You have peed (urinated) only a small amount of very dark pee during 6-8 hours. Get help right away if:  You have symptoms of   very bad dehydration.  You cannot drink fluids without throwing up (vomiting).  Your symptoms get worse with treatment.  You have a fever.  You have a very bad headache.  You are throwing up or having watery poop (diarrhea) and it: ? Gets worse. ? Does not go away.  You have blood or something green (bile) in your throw-up.  You have blood in your poop (stool). This may cause poop to look black and tarry.  You have not peed in 6-8 hours.  You pass out (faint).  Your heart rate when you are sitting still is more than 100 beats a  minute.  You have trouble breathing. This information is not intended to replace advice given to you by your health care provider. Make sure you discuss any questions you have with your health care provider. Document Released: 01/19/2009 Document Revised: 10/13/2015 Document Reviewed: 05/19/2015 Elsevier Interactive Patient Education  2018 [employer].  Dehydration, Adult Dehydration is when there is not enough fluid or water in your body. This happens when you lose more fluids than you take in. Dehydration can range from mild to very bad. It should be treated right away to keep it from getting very bad. Symptoms of mild dehydration may include:  Thirst.  Dry lips.  Slightly dry mouth.  Dry, warm skin.  Dizziness. Symptoms of moderate dehydration may include:  Very dry mouth.  Muscle cramps.  Dark pee (urine). Pee may be the color of tea.  Your body making less pee.  Your eyes making fewer tears.  Heartbeat that is uneven or faster than normal (palpitations).  Headache.  Light-headedness, especially when you stand up from sitting.  Fainting (syncope). Symptoms of very bad dehydration may include:  Changes in skin, such as: ? Cold and clammy skin. ? Blotchy (mottled) or pale skin. ? Skin that does not quickly return to normal after being lightly pinched and let go (poor skin turgor).  Changes in body fluids, such as: ? Feeling very thirsty. ? Your eyes making fewer tears. ? Not sweating when body temperature is high, such as in hot weather. ? Your body making very little pee.  Changes in vital signs, such as: ? Weak pulse. ? Pulse that is more than 100 beats a minute when you are sitting still. ? Fast breathing. ? Low blood pressure.  Other changes, such as: ? Sunken eyes. ? Cold hands and feet. ? Confusion. ? Lack of energy (lethargy). ? Trouble waking up from sleep. ? Short-term weight loss. ? Unconsciousness. Follow these instructions at  home:  If told by your doctor, drink an ORS: ? Make an ORS by using instructions on the package. ? Start by drinking small amounts, about  cup (120 mL) every 5-10 minutes. ? Slowly drink more until you have had the amount that your doctor said to have.  Drink enough clear fluid to keep your pee clear or pale yellow. If you were told to drink an ORS, finish the ORS first, then start slowly drinking clear fluids. Drink fluids such as: ? Water. Do not drink only water by itself. Doing that can make the salt (sodium) level in your body get too low (hyponatremia). ? Ice chips. ? Fruit juice that you have added water to (diluted). ? Low-calorie sports drinks.  Avoid: ? Alcohol. ? Drinks that have a lot of sugar. These include high-calorie sports drinks, fruit juice that does not have water added, and soda. ? Caffeine. ? Foods that are greasy or have  a lot of fat or sugar.  Take over-the-counter and prescription medicines only as told by your doctor.  Do not take salt tablets. Doing that can make the salt level in your body get too high (hypernatremia).  Eat foods that have minerals (electrolytes). Examples include bananas, oranges, potatoes, tomatoes, and spinach.  Keep all follow-up visits as told by your doctor. This is important. Contact a doctor if:  You have belly (abdominal) pain that: ? Gets worse. ? Stays in one area (localizes).  You have a rash.  You have a stiff neck.  You get angry or annoyed more easily than normal (irritability).  You are more sleepy than normal.  You have a harder time waking up than normal.  You feel: ? Weak. ? Dizzy. ? Very thirsty.  You have peed (urinated) only a small amount of very dark pee during 6-8 hours. Get help right away if:  You have symptoms of very bad dehydration.  You cannot drink fluids without throwing up (vomiting).  Your symptoms get worse with treatment.  You have a fever.  You have a very bad headache.  You  are throwing up or having watery poop (diarrhea) and it: ? Gets worse. ? Does not go away.  You have blood or something green (bile) in your throw-up.  You have blood in your poop (stool). This may cause poop to look black and tarry.  You have not peed in 6-8 hours.  You pass out (faint).  Your heart rate when you are sitting still is more than 100 beats a minute.  You have trouble breathing. This information is not intended to replace advice given to you by your health care provider. Make sure you discuss any questions you have with your health care provider. Document Released: 01/19/2009 Document Revised: 10/13/2015 Document Reviewed: 05/19/2015 Elsevier Interactive Patient Education  2018 [employer].

## 2017-08-29 NOTE — Progress Notes (Signed)
[facility] Progress Note   Katherine Cole [id number] [birth year] 77 y.o.   Katherine Cole is managed by Dr. Fanny Bien. Katherine Cole  Actively treated with chemotherapy: yes  Current Therapy: Carboplatin and etoposide  Assessment: Plan:    Dehydration - Plan: 0.9 %  sodium chloride infusion, sodium chloride flush (NS) 0.9 % injection 10 mL, heparin lock flush 100 unit/mL  Non-intractable vomiting with nausea, unspecified vomiting type - Plan: ondansetron (ZOFRAN) injection 4 mg, ondansetron (ZOFRAN ODT) 4 MG disintegrating tablet, sodium chloride flush (NS) 0.9 % injection 10 mL, heparin lock flush 100 unit/mL  Rales - Plan: albuterol (PROVENTIL) (2.5 MG/3ML) 0.083% nebulizer solution 2.5 mg, sodium chloride flush (NS) 0.9 % injection 10 mL, heparin lock flush 100 unit/mL  Upper respiratory tract infection, unspecified type - Plan: sulfamethoxazole-trimethoprim (BACTRIM DS,SEPTRA DS) 800-160 MG tablet, sodium chloride flush (NS) 0.9 % injection 10 mL, heparin lock flush 100 unit/mL   Dehydration: The patient was given 1 liter normal saline IV.  Nausea and vomiting: The patient was given Zofran 4 mg IV x 1 and was given a prescription for Zofran ODT 4 mg PO q 8 hours prn nausea.  Rales: The patient was treated with a albuterol nebulizer treatment with improvement in her lung sounds.  URI: The patient was given a prescription for Bactrim DS, PO BID x 7 days.  Please see After Visit Summary for patient specific instructions.  Future Appointments  Date Time Provider [place]  09/02/2017 12:30 PM [facility] LAB 5 [facility] None  09/02/2017  1:00 PM [facility] FLUSH NURSE 2 [facility] None  09/08/2017 11:45 AM [facility] LAB 3 [facility] None  09/08/2017 12:00 PM [facility] FLUSH NURSE 2 [facility] None  09/08/2017 12:15 PM Owens Shark, NP [facility] None  09/08/2017  1:30 PM [facility] G23 [facility] None  09/09/2017 12:30 PM [facility] C9 [facility] None    09/09/2017  1:45 PM Jennet Maduro, RD [facility] None  09/10/2017 12:30 PM [facility] A1 [facility] None  09/12/2017 12:45 PM [facility] INJ NURSE [facility] None  09/15/2017  3:15 PM [facility] LAB 4 [facility] None  09/15/2017  3:45 PM [facility] FLUSH NURSE 2 [facility] None    No orders of the defined types were placed in this encounter.      Subjective:   Patient ID:  Katherine Cole is a 77 y.o. (DOB 1940/08/01) female.  Chief Complaint:  Chief Complaint  Patient presents with  . Nausea  . Dizziness  . Dehydration    HPI Katherine Cole  Is a 77 year old female with an extensive stage(T2a, N2, M1c)small cell lung cancer. She originally presented witha subpleural mass within the left upper lobe, associated ipsilateral hilar and mediastinal nodal metastasis, liver metastasis and bone metastasis. She is managed by Dr. Julien Nordmann and is being treated with carboplatin and VP-16. She has had nausea since last Saturday, anorexia, increased fatigue, and productive cough with yellow sputum. She was seen by pulmonology last week and was diagnosed with stage 2 COPD. She had a fever of 101.6 earlier today at home.   Medications: I have reviewed the patient's current medications.  Allergies:  Allergies  Allergen Reactions  . Codeine Other (See Comments)    "gives me a migraine" (03/11/2012)  . Morphine And Related Other (See Comments)    "gives me a migraine" (03/11/2012)    Past Medical History:  Diagnosis Date  . Arthritis    "in my fingers"   . CAD (coronary artery disease)    a. MI  1999 with 2 stents at that time followed by stenting 3-4 yrs later. b. s/p DES to LAD guided by pressure wire 03/11/12.  . Colon polyps    adenomatous  . Depression   . Diabetes mellitus, type 2 ([place])   . Hyperlipemia   . Hypertension   . Osteoarthritis   . Pneumonia 06/2017  . Skin cancer 2012   "nose; right middle finger"  . Stroke [facility])     Past Surgical History:  Procedure Laterality  Date  . APPENDECTOMY  ~ 1965  . CATARACT EXTRACTION    . CORONARY ANGIOPLASTY WITH STENT PLACEMENT  1999; 2004; 03/11/2012   "2 + 1 + 1; total of 4" (03/11/2012)  . FEMUR FRACTURE SURGERY  2011   RLE (03/11/2012)  . IR FLUORO GUIDE PORT INSERTION RIGHT  07/18/2017  . IR US GUIDE VASC ACCESS RIGHT  07/18/2017  . PERCUTANEOUS CORONARY STENT INTERVENTION (PCI-S) N/A 03/11/2012   Procedure: PERCUTANEOUS CORONARY STENT INTERVENTION (PCI-S);  Surgeon: Sherren Mocha, MD;  Location: [facility] C[facility] LAB;  Service: Cardiovascular;  Laterality: N/A;  . SKIN CANCER EXCISION  2013   "nose & right middle finger; actinic keratosis" (03/11/2012)  . TONSILLECTOMY AND ADENOIDECTOMY  ~ 1962  . TUBAL LIGATION  1977  . VIDEO BRONCHOSCOPY WITH ENDOBRONCHIAL ULTRASOUND N/A 07/04/2017   Procedure: VIDEO BRONCHOSCOPY WITH ENDOBRONCHIAL ULTRASOUND;  Surgeon: Collene Gobble, MD;  Location: [facility] OR;  Service: Thoracic;  Laterality: N/A;    Family History  Problem Relation Age of Onset  . Liver cancer Mother   . Brain cancer Father   . Stomach cancer Father   . Colon cancer Brother 45  . Lung cancer Sister   . Lung cancer Brother   . Stomach cancer Brother   . Diabetes Daughter     Social History   Socioeconomic History  . Marital status: Married    Spouse name: Not on file  . Number of children: 3  . Years of education: Not on file  . Highest education level: Not on file  Occupational History  . Occupation: Retired  Scientific laboratory technician  . Financial resource strain: Not on file  . Food insecurity:    Worry: Not on file    Inability: Not on file  . Transportation needs:    Medical: Not on file    Non-medical: Not on file  Tobacco Use  . Smoking status: Current Every Day Smoker    Packs/day: 0.50    Years: 51.00    Pack years: 25.50    Types: Cigarettes  . Smokeless tobacco: Never Used  Substance and Sexual Activity  . Alcohol use: No  . Drug use: No  . Sexual activity: Never  Lifestyle  . Physical activity:      Days per week: Not on file    Minutes per session: Not on file  . Stress: Not on file  Relationships  . Social connections:    Talks on phone: Not on file    Gets together: Not on file    Attends religious service: Not on file    Active member of club or organization: Not on file    Attends meetings of clubs or organizations: Not on file    Relationship status: Not on file  . Intimate partner violence:    Fear of current or ex partner: Not on file    Emotionally abused: Not on file    Physically abused: Not on file    Forced sexual activity: Not on file  Other Topics Concern  . Not on file  Social History Narrative   Grandson lives with her.      Past Medical History, Surgical history, Social history, and Family history were reviewed and updated as appropriate.   Please see review of systems for further details on the patient's review from today.   Review of Systems:  Review of Systems  Constitutional: Positive for fatigue and fever. Negative for chills and diaphoresis.  HENT: Negative for rhinorrhea.   Respiratory: Positive for cough and shortness of breath. Negative for chest tightness and wheezing.   Cardiovascular: Negative for chest pain, palpitations and leg swelling.  Gastrointestinal: Positive for nausea. Negative for vomiting.  Neurological: Negative for headaches.    Objective:   Physical Exam:  BP 132/68 (BP Location: Left Arm, Patient Position: Sitting)   Pulse 83   Temp 98.4 F (36.9 C) (Oral)   Resp 17   Ht 5\' 1"  (1.549 m)   Wt 82 lb 4.8 oz (37.3 kg)   SpO2 100%   BMI 15.55 kg/m  ECOG: 0  Physical Exam  Constitutional: No distress.  HENT:  Head: Normocephalic and atraumatic.  Right Ear: External ear normal.  Left Ear: External ear normal.  Mouth/Throat: Oropharynx is clear and moist. No oropharyngeal exudate.  Neck: Normal range of motion. Neck supple.  Cardiovascular: Normal rate, regular rhythm and normal heart sounds. Exam reveals no  gallop and no friction rub.  No murmur heard. Pulmonary/Chest: Effort normal. No respiratory distress. She has no wheezes. She has rales (Rales throughout both lung fields.).  Lymphadenopathy:    She has no cervical adenopathy.  Neurological: She is alert. Coordination normal.  Skin: Skin is warm and dry. No rash noted. She is not diaphoretic. No erythema.  Psychiatric: She has a normal mood and affect. Her behavior is normal. Judgment and thought content normal.   Pre-albuterol exam: See above physical exam  Post-albuterol exam: bilateral rales are mildly improved after an albuterol nebulizer treatment.   Lab Review:     Component Value Date/Time   NA 135 08/26/2017 2319   K 4.0 08/26/2017 2319   CL 102 08/26/2017 2319   CO2 23 08/26/2017 2319   GLUCOSE 211 (H) 08/26/2017 2319   BUN 26 (H) 08/26/2017 2319   CREATININE 0.75 08/26/2017 2319   CREATININE 0.80 08/25/2017 1415   CALCIUM 8.6 (L) 08/26/2017 2319   PROT 6.6 08/26/2017 2319   ALBUMIN 3.1 (L) 08/26/2017 2319   AST 18 08/26/2017 2319   AST 18 08/25/2017 1415   ALT 21 08/26/2017 2319   ALT 23 08/25/2017 1415   ALKPHOS 122 08/26/2017 2319   BILITOT 0.5 08/26/2017 2319   BILITOT 0.4 08/25/2017 1415   GFRNONAA >60 08/26/2017 2319   GFRNONAA >60 08/25/2017 1415   GFRAA >60 08/26/2017 2319   GFRAA >60 08/25/2017 1415       Component Value Date/Time   WBC 8.6 08/26/2017 2319   RBC 2.72 (L) 08/26/2017 2319   HGB 8.3 (L) 08/26/2017 2319   HGB 9.0 (L) 08/25/2017 1415   HCT 24.4 (L) 08/26/2017 2319   HCT 27.3 (L) 08/04/2017 1704   PLT 269 08/26/2017 2319   PLT 360 08/25/2017 1415   MCV 89.7 08/26/2017 2319   MCH 30.5 08/26/2017 2319   MCHC 34.0 08/26/2017 2319   RDW 15.9 (H) 08/26/2017 2319   LYMPHSABS 1.7 08/26/2017 2319   MONOABS 0.9 08/26/2017 2319   EOSABS 0.0 08/26/2017 2319   BASOSABS 0.1 08/26/2017 2319   -------------------------------  Imaging from last 24 hours (if applicable):  Radiology  interpretation: Dg Chest 2 View  Result Date: 08/26/2017 CLINICAL DATA:  Cancer patient on chemotherapy with fever and lethargy. EXAM: CHEST - 2 VIEW COMPARISON:  08/14/2017 FINDINGS: Heart size is normal. There is moderate aortic atherosclerosis without aneurysmal dilatation. Port catheter tip terminates in the mid to distal SVC. No pulmonary consolidation, effusion or pneumothorax. Minimal atelectasis and/or scarring at the lung bases. Soft tissue calcifications adjacent to the humeral heads bilaterally compatible with calcific rotator cuff tendinopathy. IMPRESSION: Stable appearance of the chest with bibasilar atelectasis. No active pulmonary disease. Aortic atherosclerosis. Electronically Signed   By: Ashley Royalty M.D.   On: 08/26/2017 23:46   Dg Chest 2 View  Result Date: 08/14/2017 CLINICAL DATA:  77 year old female with weakness. EXAM: CHEST - 2 VIEW COMPARISON:  Chest radiograph dated 08/13/2017 FINDINGS: Right pectoral Port-A-Cath with tip at the cavoatrial junction. Diffuse interstitial coarsening and bronchitic changes. There is no focal consolidation, pleural effusion, or pneumothorax. The cardiac silhouette is within normal limits. Coronary stents noted. There is osteopenia with degenerative changes of the spine. No acute osseous pathology. IMPRESSION: No active cardiopulmonary disease. Electronically Signed   By: Anner Crete M.D.   On: 08/14/2017 21:33   Dg Chest 2 View  Result Date: 08/13/2017 CLINICAL DATA:  Patient hospitalized last week for pneumonia with persistent productive cough. History of left upper lobe lung cancer. Patient currently on chemotherapy. EXAM: CHEST - 2 VIEW COMPARISON:  08/07/2017 and chest CT 08/08/2017 FINDINGS: Right IJ Port-A-Cath unchanged. Lungs are adequately inflated with no significant change in patient's peripheral lateral left upper lobe lung cancer. No definite focal airspace consolidation. Mild prominence of the central perihilar markings which may  indicate a mild degree of vascular congestion. No effusion. Cardiomediastinal silhouette and remainder of the exam is unchanged. IMPRESSION: No evidence of pneumonia. Suggestion mild vascular congestion. Known stable lung cancer over the lateral left upper lobe. Electronically Signed   By: Marin Olp M.D.   On: 08/13/2017 19:18   Dg Chest 2 View  Result Date: 08/02/2017 CLINICAL DATA:  Cough.  Low blood sugar for 2 days. EXAM: CHEST - 2 VIEW COMPARISON:  06/30/2017.  PET-CT, 07/17/2017. FINDINGS: Cardiac silhouette is normal in size. No mediastinal masses. Prominent left hilum consistent with adenopathy as noted on the prior PET-CT. Peripheral left upper lobe pleural based opacity is less apparent on the prior chest radiograph. Lungs are hyperexpanded. There is no evidence of pneumonia or pulmonary edema. No pleural effusion or pneumothorax. Right anterior chest wall Port-A-Cath is new from the prior exam. Tip projects in the lower superior vena cava. Skeletal structures are demineralized but grossly intact. IMPRESSION: 1. No acute cardiopulmonary disease. 2. Left upper lobe pleural-based lung mass consistent with carcinoma is less apparent than on the prior study. Prominent left hilum is consistent with metastatic adenopathy. Electronically Signed   By: Lajean Manes M.D.   On: 08/02/2017 20:49   Ct Chest W Contrast  Result Date: 08/08/2017 CLINICAL DATA:  Acute respiratory illness. Left upper lobe lung cancer, on chemotherapy. EXAM: CT CHEST WITH CONTRAST TECHNIQUE: Multidetector CT imaging of the chest was performed during intravenous contrast administration. CONTRAST:  97mL OMNIPAQUE IOHEXOL 300 MG/ML  SOLN COMPARISON:  Chest radiograph dated 08/07/2017. PET-CT dated 07/17/2017. FINDINGS: Cardiovascular: The heart is normal in size. No pericardial effusion. No evidence of thoracic aortic aneurysm. Atherosclerotic calcifications of the aortic arch. Three vessel coronary atherosclerosis. Right chest port  terminating at the cavoatrial  junction. Mediastinum/Nodes: 12 mm short axis AP window node (series 3/image 58), previously 17 mm. 14 mm short axis left perihilar node (series 3/image 78), previously 19 mm when measured in a similar fashion. Visualized thyroid is unremarkable. Lungs/Pleura: 3.0 x 1.8 cm subpleural mass in the lateral left upper lobe (series 7/image 61), previously 4.2 x 2.2 cm, corresponding to the patient's known primary bronchogenic neoplasm. Surrounding mild ground-glass opacity. Mild patchy opacity in the left lower lobe (series 7/image 111), suspicious for pneumonia, less likely atelectasis. Additional bibasilar atelectasis. Small bilateral pleural effusions. Mild biapical pleural-parenchymal scarring. No pneumothorax. Upper Abdomen: Visualized upper abdomen is notable for a 10 mm metastasis in segment 4B (series 3/image 145), previously 14 mm. Musculoskeletal: Mild degenerative changes of the visualized thoracolumbar spine. IMPRESSION: 3.0 cm subpleural mass in the lateral left upper lobe, corresponding to the patient's known primary bronchogenic neoplasm, decreased. Improving mediastinal and left perihilar nodal metastases. 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 Mild patchy left lower lobe opacity, suspicious for pneumonia, less likely atelectasis. Bibasilar atelectasis with associated small bilateral pleural effusions. Aortic Atherosclerosis (ICD10-I70.0). Electronically Signed   By: Julian Hy M.D.   On: 08/08/2017 21:25   Dg Chest Port 1 View  Result Date: 08/07/2017 CLINICAL DATA:  Wheezing. EXAM: PORTABLE CHEST 1 VIEW COMPARISON:  08/02/2017 FINDINGS: A right jugular Port-A-Cath terminates over the mid SVC. The cardiac silhouette is normal in size. Mild left hilar prominence corresponds to known lymphadenopathy. Aortic atherosclerosis and coronary artery stents are noted. The lungs remain hyperinflated. Peripheral opacity in the lateral left mid lung is slightly more  prominent and corresponds to the previously demonstrated lung cancer. There is peribronchial scratched of there is increased peribronchial thickening and interstitial accentuation bilaterally with mild patchy airspace opacity in the right midlung and both lung bases. No sizable pleural effusion or pneumothorax is identified. No acute osseous abnormality is seen. IMPRESSION: Increasing bilateral interstitial and patchy airspace opacities concerning for worsening pneumonia. Electronically Signed   By: Logan Bores M.D.   On: 08/07/2017 09:30        This case was discussed with Dr. Julien Nordmann. He expressed agreement with my management of this patient.

## 2017-09-01 LAB — CULTURE, BLOOD (ROUTINE X 2)
CULTURE: NO GROWTH
Culture: NO GROWTH
SPECIAL REQUESTS: ADEQUATE
SPECIAL REQUESTS: ADEQUATE

## 2017-09-02 ENCOUNTER — Ambulatory Visit: Payer: Medicare Other | Admitting: Nurse Practitioner

## 2017-09-02 ENCOUNTER — Inpatient Hospital Stay: Payer: Medicare Other

## 2017-09-02 ENCOUNTER — Other Ambulatory Visit: Payer: Medicare Other

## 2017-09-02 ENCOUNTER — Ambulatory Visit: Payer: Medicare Other

## 2017-09-02 DIAGNOSIS — Z95828 Presence of other vascular implants and grafts: Secondary | ICD-10-CM

## 2017-09-02 DIAGNOSIS — C3492 Malignant neoplasm of unspecified part of left bronchus or lung: Secondary | ICD-10-CM

## 2017-09-02 DIAGNOSIS — C3412 Malignant neoplasm of upper lobe, left bronchus or lung: Secondary | ICD-10-CM

## 2017-09-02 DIAGNOSIS — Z5111 Encounter for antineoplastic chemotherapy: Secondary | ICD-10-CM | POA: Diagnosis not present

## 2017-09-02 LAB — CMP (CANCER CENTER ONLY)
ALT: 21 U/L (ref 0–55)
ANION GAP: 12 — AB (ref 3–11)
AST: 17 U/L (ref 5–34)
Albumin: 3.3 g/dL — ABNORMAL LOW (ref 3.5–5.0)
Alkaline Phosphatase: 145 U/L (ref 40–150)
BUN: 12 mg/dL (ref 7–26)
CHLORIDE: 99 mmol/L (ref 98–109)
CO2: 24 mmol/L (ref 22–29)
CREATININE: 0.94 mg/dL (ref 0.60–1.10)
Calcium: 9.3 mg/dL (ref 8.4–10.4)
GFR, EST NON AFRICAN AMERICAN: 58 mL/min — AB (ref >60)
Glucose, Bld: 221 mg/dL — ABNORMAL HIGH (ref 70–140)
Potassium: 4.8 mmol/L (ref 3.5–5.1)
Sodium: 135 mmol/L — ABNORMAL LOW (ref 136–145)
Total Bilirubin: <0.2 mg/dL — ABNORMAL LOW (ref 0.2–1.2)
Total Protein: 7.2 g/dL (ref 6.4–8.3)

## 2017-09-02 LAB — CBC WITH DIFFERENTIAL (CANCER CENTER ONLY)
BASOS ABS: 0.1 10*3/uL (ref 0.0–0.1)
BASOS PCT: 0 %
Eosinophils Absolute: 0 10*3/uL (ref 0.0–0.5)
Eosinophils Relative: 0 %
HEMATOCRIT: 26.8 % — AB (ref 34.8–46.6)
HEMOGLOBIN: 8.8 g/dL — AB (ref 11.6–15.9)
Lymphocytes Relative: 18 %
Lymphs Abs: 2.7 10*3/uL (ref 0.9–3.3)
MCH: 29.7 pg (ref 25.1–34.0)
MCHC: 32.8 g/dL (ref 31.5–36.0)
MCV: 90.5 fL (ref 79.5–101.0)
Monocytes Absolute: 0.8 10*3/uL (ref 0.1–0.9)
Monocytes Relative: 5 %
NEUTROS ABS: 11.6 10*3/uL — AB (ref 1.5–6.5)
NEUTROS PCT: 77 %
PLATELETS: 127 10*3/uL — AB (ref 145–400)
RBC: 2.96 MIL/uL — ABNORMAL LOW (ref 3.70–5.45)
RDW: 16.1 % — AB (ref 11.2–14.5)
WBC Count: 15.1 10*3/uL — ABNORMAL HIGH (ref 3.9–10.3)

## 2017-09-02 MED ORDER — HEPARIN SOD (PORK) LOCK FLUSH 100 UNIT/ML IV SOLN
250.0000 [IU] | Freq: Once | INTRAVENOUS | Status: AC
  Administered 2017-09-02: 100 [IU]
  Filled 2017-09-02: qty 5

## 2017-09-02 MED ORDER — SODIUM CHLORIDE 0.9% FLUSH
10.0000 mL | Freq: Once | INTRAVENOUS | Status: AC
  Administered 2017-09-02: 10 mL
  Filled 2017-09-02: qty 10

## 2017-09-03 ENCOUNTER — Telehealth: Payer: Self-pay | Admitting: Medical Oncology

## 2017-09-03 ENCOUNTER — Ambulatory Visit: Payer: Medicare Other

## 2017-09-03 ENCOUNTER — Other Ambulatory Visit: Payer: Self-pay | Admitting: Medical Oncology

## 2017-09-03 DIAGNOSIS — R112 Nausea with vomiting, unspecified: Secondary | ICD-10-CM

## 2017-09-03 MED ORDER — DEXAMETHASONE 4 MG PO TABS: 4.0000 mg | ORAL_TABLET | Freq: Two times a day (BID) | ORAL | 0 refills | Status: AC

## 2017-09-03 NOTE — Telephone Encounter (Signed)
[place] RN states pt has been to ED several times this month for dehydration/fever. I called pt -she is nauseated and has been most of the week. Last time she vomited was twice on  Monday after eating x 2 and when she drank water. Today she took  zofran ODT and has been 2-3 times a day.  She drank 3 cups of coffee, boost and 8 oz juice. Cereal 1 bowl stayed down today. LBM yesterday and reports it was normal. She has two more days of Septra. I asked her to call us back tomorrow to see how she is doing. Per Julien Nordmann I called in decadron 4 mg bid x 3 days. Pt notified.

## 2017-09-04 ENCOUNTER — Ambulatory Visit: Payer: Medicare Other

## 2017-09-06 ENCOUNTER — Ambulatory Visit: Payer: Medicare Other

## 2017-09-08 ENCOUNTER — Encounter: Payer: Self-pay | Admitting: Nurse Practitioner

## 2017-09-08 ENCOUNTER — Inpatient Hospital Stay: Payer: Medicare Other

## 2017-09-08 ENCOUNTER — Other Ambulatory Visit: Payer: Self-pay | Admitting: Emergency Medicine

## 2017-09-08 ENCOUNTER — Inpatient Hospital Stay (HOSPITAL_BASED_OUTPATIENT_CLINIC_OR_DEPARTMENT_OTHER): Payer: Medicare Other | Admitting: Nurse Practitioner

## 2017-09-08 ENCOUNTER — Telehealth: Payer: Self-pay | Admitting: Nurse Practitioner

## 2017-09-08 ENCOUNTER — Inpatient Hospital Stay: Payer: Medicare Other | Attending: Internal Medicine

## 2017-09-08 VITALS — BP 151/77 | HR 77 | Temp 98.8°F | Resp 17 | Ht 61.0 in | Wt 78.9 lb

## 2017-09-08 DIAGNOSIS — C3412 Malignant neoplasm of upper lobe, left bronchus or lung: Secondary | ICD-10-CM

## 2017-09-08 DIAGNOSIS — C3492 Malignant neoplasm of unspecified part of left bronchus or lung: Secondary | ICD-10-CM

## 2017-09-08 DIAGNOSIS — R634 Abnormal weight loss: Secondary | ICD-10-CM

## 2017-09-08 DIAGNOSIS — R42 Dizziness and giddiness: Secondary | ICD-10-CM | POA: Diagnosis not present

## 2017-09-08 DIAGNOSIS — C778 Secondary and unspecified malignant neoplasm of lymph nodes of multiple regions: Secondary | ICD-10-CM | POA: Insufficient documentation

## 2017-09-08 DIAGNOSIS — D72823 Leukemoid reaction: Secondary | ICD-10-CM | POA: Insufficient documentation

## 2017-09-08 DIAGNOSIS — Z5189 Encounter for other specified aftercare: Secondary | ICD-10-CM | POA: Insufficient documentation

## 2017-09-08 DIAGNOSIS — C787 Secondary malignant neoplasm of liver and intrahepatic bile duct: Secondary | ICD-10-CM | POA: Insufficient documentation

## 2017-09-08 DIAGNOSIS — R531 Weakness: Secondary | ICD-10-CM | POA: Insufficient documentation

## 2017-09-08 DIAGNOSIS — Z5111 Encounter for antineoplastic chemotherapy: Secondary | ICD-10-CM | POA: Insufficient documentation

## 2017-09-08 DIAGNOSIS — R5383 Other fatigue: Secondary | ICD-10-CM

## 2017-09-08 DIAGNOSIS — C7951 Secondary malignant neoplasm of bone: Secondary | ICD-10-CM | POA: Diagnosis not present

## 2017-09-08 DIAGNOSIS — Z95828 Presence of other vascular implants and grafts: Secondary | ICD-10-CM

## 2017-09-08 DIAGNOSIS — E860 Dehydration: Secondary | ICD-10-CM | POA: Insufficient documentation

## 2017-09-08 DIAGNOSIS — R0989 Other specified symptoms and signs involving the circulatory and respiratory systems: Secondary | ICD-10-CM | POA: Diagnosis not present

## 2017-09-08 LAB — CBC WITH DIFFERENTIAL (CANCER CENTER ONLY)
BASOS PCT: 1 %
Basophils Absolute: 0.1 10*3/uL (ref 0.0–0.1)
EOS ABS: 0 10*3/uL (ref 0.0–0.5)
Eosinophils Relative: 0 %
HEMATOCRIT: 28.4 % — AB (ref 34.8–46.6)
HEMOGLOBIN: 9.9 g/dL — AB (ref 11.6–15.9)
LYMPHS ABS: 1.6 10*3/uL (ref 0.9–3.3)
Lymphocytes Relative: 19 %
MCH: 31.8 pg (ref 25.1–34.0)
MCHC: 34.8 g/dL (ref 31.5–36.0)
MCV: 91.5 fL (ref 79.5–101.0)
MONO ABS: 0.6 10*3/uL (ref 0.1–0.9)
Monocytes Relative: 7 %
NEUTROS PCT: 73 %
Neutro Abs: 6.2 10*3/uL (ref 1.5–6.5)
Platelet Count: 352 10*3/uL (ref 145–400)
RBC: 3.11 MIL/uL — ABNORMAL LOW (ref 3.70–5.45)
RDW: 18.1 % — AB (ref 11.2–14.5)
WBC Count: 8.5 10*3/uL (ref 3.9–10.3)

## 2017-09-08 LAB — CMP (CANCER CENTER ONLY)
ALBUMIN: 3.3 g/dL — AB (ref 3.5–5.0)
ALT: 8 U/L (ref 0–55)
AST: 14 U/L (ref 5–34)
Alkaline Phosphatase: 129 U/L (ref 40–150)
Anion gap: 9 (ref 3–11)
BILIRUBIN TOTAL: 0.2 mg/dL (ref 0.2–1.2)
BUN: 19 mg/dL (ref 7–26)
CALCIUM: 9.1 mg/dL (ref 8.4–10.4)
CO2: 25 mmol/L (ref 22–29)
Chloride: 100 mmol/L (ref 98–109)
Creatinine: 0.89 mg/dL (ref 0.60–1.10)
GFR, Est AFR Am: >60 mL/min (ref >60)
GFR, Estimated: >60 mL/min (ref >60)
Glucose, Bld: 298 mg/dL — ABNORMAL HIGH (ref 70–140)
POTASSIUM: 4.7 mmol/L (ref 3.5–5.1)
Sodium: 134 mmol/L — ABNORMAL LOW (ref 136–145)
Total Protein: 7 g/dL (ref 6.4–8.3)

## 2017-09-08 LAB — SAMPLE TO BLOOD BANK

## 2017-09-08 MED ORDER — SODIUM CHLORIDE 0.9 % IV SOLN
216.4000 mg | Freq: Once | INTRAVENOUS | Status: AC
  Administered 2017-09-08: 220 mg via INTRAVENOUS
  Filled 2017-09-08: qty 22

## 2017-09-08 MED ORDER — SODIUM CHLORIDE 0.9 % IV SOLN
Freq: Once | INTRAVENOUS | Status: AC
  Administered 2017-09-08: 14:00:00 via INTRAVENOUS

## 2017-09-08 MED ORDER — PALONOSETRON HCL INJECTION 0.25 MG/5ML
0.2500 mg | Freq: Once | INTRAVENOUS | Status: AC
  Administered 2017-09-08: 0.25 mg via INTRAVENOUS

## 2017-09-08 MED ORDER — FOSAPREPITANT DIMEGLUMINE INJECTION 150 MG
Freq: Once | INTRAVENOUS | Status: AC
  Administered 2017-09-08: 14:00:00 via INTRAVENOUS
  Filled 2017-09-08: qty 5

## 2017-09-08 MED ORDER — SODIUM CHLORIDE 0.9% FLUSH
10.0000 mL | Freq: Once | INTRAVENOUS | Status: AC
  Administered 2017-09-08: 10 mL
  Filled 2017-09-08: qty 10

## 2017-09-08 MED ORDER — PALONOSETRON HCL INJECTION 0.25 MG/5ML
INTRAVENOUS | Status: AC
  Filled 2017-09-08: qty 5

## 2017-09-08 MED ORDER — SODIUM CHLORIDE 0.9 % IV SOLN
80.0000 mg/m2 | Freq: Once | INTRAVENOUS | Status: AC
  Administered 2017-09-08: 100 mg via INTRAVENOUS
  Filled 2017-09-08: qty 5

## 2017-09-08 MED ORDER — SODIUM CHLORIDE 0.9% FLUSH
10.0000 mL | INTRAVENOUS | Status: DC | PRN
  Administered 2017-09-08: 10 mL
  Filled 2017-09-08: qty 10

## 2017-09-08 MED ORDER — HEPARIN SOD (PORK) LOCK FLUSH 100 UNIT/ML IV SOLN
500.0000 [IU] | Freq: Once | INTRAVENOUS | Status: AC | PRN
  Administered 2017-09-08: 500 [IU]
  Filled 2017-09-08: qty 5

## 2017-09-08 NOTE — Progress Notes (Addendum)
Katherine Cole OFFICE PROGRESS NOTE   DIAGNOSIS:Extensive stage(T2a, N2, M1c)small cell lung cancer presented withsubpleural mass within the left upper lobewithassociated ipsilateral hilar and mediastinal nodal metastasis, liver metastasis and bone metastasis  PRIOR THERAPY:None  CURRENT THERAPY:Systemic chemotherapy with carboplatin for AUC of 5 on day 1 and etoposide 100 mg/M2 on days 1, 2 and 3 with Neulasta support every 3 weeks.  Cycle 1 07/22/2017; cycle 2 08/18/2017.     INTERVAL HISTORY:   [name] returns for follow-up.  She completed cycle 2 carboplatin/etoposide beginning 08/18/2017.  She was seen in the emergency department 08/27/2017 with nausea, vomiting and fevers.  She was not neutropenic.  Chest x-ray showed a stable appearance with bibasilar atelectasis.  Blood cultures were negative.  She was given Phenergan and fluids, discharged home.  She received IV fluids in our office 08/28/2017.  She developed nausea/vomiting around day 6.  This occurred intermittently for a week.  As noted above she was seen in the emergency department 08/27/2017 and received IV fluids in our office 08/28/2017.  She denies mouth sores.  No diarrhea.  She had constipation for a few days.  Appetite is poor.  She states she is "not hungry".  She is losing weight.  She denies shortness of breath.  No pain.  Objective:  Vital signs in last 24 hours:  Blood pressure (!) 151/77, pulse 77, temperature 98.8 F (37.1 C), temperature source Oral, resp. rate 17, height 5\' 1"  (1.549 m), weight 78 lb 14.4 oz (35.8 kg), SpO2 96 %.    HEENT: No thrush or ulcers. Resp: Distant breath sounds. Cardio: Regular rate and rhythm. GI: Abdomen soft and nontender.  No hepatomegaly. Vascular: No leg edema.  Skin: No rash.   Lab Results:  Lab Results  Component Value Date   WBC 8.5 09/08/2017   HGB 9.9 (L) 09/08/2017   HCT 28.4 (L) 09/08/2017   MCV 91.5 09/08/2017   PLT 352 09/08/2017   NEUTROABS 6.2 09/08/2017    Imaging:  No results found.  Medications: I have reviewed the patient's current medications.  Assessment/Plan: 1. Extensive stage small cell lung cancer status post cycle 1 carboplatin/etoposide beginning 07/22/2017.  Status post cycle 2 beginning 08/18/2017. 2. Hospitalization 08/02/2017 through 08/10/2017 presenting with fever and cough. 3. Respiratory panel positive for rhinovirus 08/03/2017 4. CT chest 08/08/2017- lateral left upper lobe mass decreased; improving mediastinal and left perihilar nodal metastases; improving hepatic metastasis segment 4B; mild patchy left lower lobe opacity suspicious for pneumonia.  Disposition: [name] appears unchanged.  She has completed 2 cycles of carboplatin/etoposide.  Cycle 2 was complicated by nausea/vomiting.  She finds that Phenergan and Zofran worked fairly well.  Plan to proceed with cycle 3 today as scheduled.  Dr. Julien Nordmann recommends restaging CTs after this cycle.  She will return for a follow-up visit and cycle 4 carboplatin/etoposide in 3 weeks.  She will contact the office in the interim with any problems.  Patient seen with Dr. Julien Nordmann.    Ned Card ANP/GNP-BC   09/08/2017  12:51 PM  ADDENDUM: Hematology/Oncology Attending: I had a face-to-face encounter with the patient today.  I recommended her care plan.  This is a very pleasant 77 years old white female with extensive stage small cell lung cancer status post 2 cycles of systemic chemotherapy with carboplatin and etoposide.  The patient is feeling fine today except for the generalized fatigue and weight loss.  Her blood count is good today.  I recommended for her to proceed  with cycle #3 today as scheduled. I strongly encouraged the patient to increase her oral intake in addition to nutrition supplements.  I will see her back for follow-up visit in 3 weeks for evaluation before starting cycle #4 after repeating CT scan of the chest, abdomen and pelvis for  restaging of her disease. The patient was advised to call immediately if she has any concerning symptoms in the interval.  Disclaimer: This note was dictated with voice recognition software. Similar sounding words can inadvertently be transcribed and may be missed upon review. [name], MD 09/08/17

## 2017-09-08 NOTE — Telephone Encounter (Signed)
Appointments scheduled AVS/Calendar printed per 6/3 los

## 2017-09-08 NOTE — Patient Instructions (Signed)
[place] Discharge Instructions for Patients Receiving Chemotherapy  Today you received the following chemotherapy agents: Carboplatin, Etoposide   To help prevent nausea and vomiting after your treatment, we encourage you to take your nausea medication as prescribed.   If you develop nausea and vomiting that is not controlled by your nausea medication, call the clinic.   BELOW ARE SYMPTOMS THAT SHOULD BE REPORTED IMMEDIATELY:  *FEVER GREATER THAN 100.5 F  *CHILLS WITH OR WITHOUT FEVER  NAUSEA AND VOMITING THAT IS NOT CONTROLLED WITH YOUR NAUSEA MEDICATION  *UNUSUAL SHORTNESS OF BREATH  *UNUSUAL BRUISING OR BLEEDING  TENDERNESS IN MOUTH AND THROAT WITH OR WITHOUT PRESENCE OF ULCERS  *URINARY PROBLEMS  *BOWEL PROBLEMS  UNUSUAL RASH Items with * indicate a potential emergency and should be followed up as soon as possible.  Feel free to call the clinic should you have any questions or concerns. The clinic phone number is [phone].  Please show the [place] at check-in to the Emergency Department and triage nurse.

## 2017-09-09 ENCOUNTER — Inpatient Hospital Stay: Payer: Medicare Other

## 2017-09-09 VITALS — BP 152/62 | HR 59 | Resp 17

## 2017-09-09 DIAGNOSIS — C3412 Malignant neoplasm of upper lobe, left bronchus or lung: Secondary | ICD-10-CM

## 2017-09-09 DIAGNOSIS — C778 Secondary and unspecified malignant neoplasm of lymph nodes of multiple regions: Secondary | ICD-10-CM | POA: Diagnosis not present

## 2017-09-09 DIAGNOSIS — C787 Secondary malignant neoplasm of liver and intrahepatic bile duct: Secondary | ICD-10-CM | POA: Diagnosis not present

## 2017-09-09 DIAGNOSIS — D72823 Leukemoid reaction: Secondary | ICD-10-CM | POA: Diagnosis not present

## 2017-09-09 DIAGNOSIS — Z5111 Encounter for antineoplastic chemotherapy: Secondary | ICD-10-CM | POA: Diagnosis not present

## 2017-09-09 DIAGNOSIS — C7951 Secondary malignant neoplasm of bone: Secondary | ICD-10-CM | POA: Diagnosis not present

## 2017-09-09 MED ORDER — SODIUM CHLORIDE 0.9% FLUSH
10.0000 mL | INTRAVENOUS | Status: DC | PRN
  Administered 2017-09-09: 10 mL
  Filled 2017-09-09: qty 10

## 2017-09-09 MED ORDER — DEXAMETHASONE SODIUM PHOSPHATE 10 MG/ML IJ SOLN
INTRAMUSCULAR | Status: AC
  Filled 2017-09-09: qty 1

## 2017-09-09 MED ORDER — HEPARIN SOD (PORK) LOCK FLUSH 100 UNIT/ML IV SOLN
500.0000 [IU] | Freq: Once | INTRAVENOUS | Status: AC | PRN
  Administered 2017-09-09: 500 [IU]
  Filled 2017-09-09: qty 5

## 2017-09-09 MED ORDER — ETOPOSIDE CHEMO INJECTION 1 GM/50ML
80.0000 mg/m2 | Freq: Once | INTRAVENOUS | Status: AC
  Administered 2017-09-09: 100 mg via INTRAVENOUS
  Filled 2017-09-09: qty 5

## 2017-09-09 MED ORDER — DEXAMETHASONE SODIUM PHOSPHATE 10 MG/ML IJ SOLN
10.0000 mg | Freq: Once | INTRAMUSCULAR | Status: AC
  Administered 2017-09-09: 10 mg via INTRAVENOUS

## 2017-09-09 NOTE — Patient Instructions (Signed)
[place] Discharge Instructions for Patients Receiving Chemotherapy  Today you received the following chemotherapy agents: Etoposide   To help prevent nausea and vomiting after your treatment, we encourage you to take your nausea medication as prescribed.   If you develop nausea and vomiting that is not controlled by your nausea medication, call the clinic.   BELOW ARE SYMPTOMS THAT SHOULD BE REPORTED IMMEDIATELY:  *FEVER GREATER THAN 100.5 F  *CHILLS WITH OR WITHOUT FEVER  NAUSEA AND VOMITING THAT IS NOT CONTROLLED WITH YOUR NAUSEA MEDICATION  *UNUSUAL SHORTNESS OF BREATH  *UNUSUAL BRUISING OR BLEEDING  TENDERNESS IN MOUTH AND THROAT WITH OR WITHOUT PRESENCE OF ULCERS  *URINARY PROBLEMS  *BOWEL PROBLEMS  UNUSUAL RASH Items with * indicate a potential emergency and should be followed up as soon as possible.  Feel free to call the clinic should you have any questions or concerns. The clinic phone number is [phone].  Please show the [place] at check-in to the Emergency Department and triage nurse.

## 2017-09-09 NOTE — Progress Notes (Signed)
Nutrition Follow-up:  Patient with small cell lung cancer.  Met with patient during infusion.  Reports that appetite has been decreased due to nausea and vomiting recently. Noted ER visit on 5/21 for nausea and vomiting.  Reports medications have been adjusted and feeling better.  Reports that she is drinking boost original shakes. Likes nuts and drinking whole milk.  Medications: reviewed  Labs: reviewed  Anthropometrics:   Weight 78 lb 14.4 oz measured on 6/3 decreased from 88 lb on 5/14.    11% weight loss in the last 21 days, significant   NUTRITION DIAGNOSIS: Inadequate oral intake continues   MALNUTRITION DIAGNOSIS: Patient meets criteria for severe malnutrition in context of acute illness likely progressing to chronic as evidenced by eating < or equal to 50% of energy needs for > 5 days and 11% weight loss in 21 days.    INTERVENTION:   Discussed high calorie, high protein foods and ways to incorporate them into diet.  Handout given on protein foods. Daughter with questions regarding protein powders.   Encouraged switching to boost plus shakes for more calories and protein. Encouraged patient to liberalize diet to increase calories and protein at this time. Encourage making medication adjustments to control blood glucose vs restricting diet.  If patient continues to loose weight would encourage appetite stimulant.     MONITORING, EVALUATION, GOAL: Patient will tolerate increased calories and protein for weight maintenance.   NEXT VISIT: June 25 during infusion  Katherine Cole, [place], [place] Registered Dietitian [phone] (pager)

## 2017-09-10 ENCOUNTER — Inpatient Hospital Stay: Payer: Medicare Other

## 2017-09-10 VITALS — BP 172/62 | HR 59 | Temp 98.5°F | Resp 17

## 2017-09-10 DIAGNOSIS — C3412 Malignant neoplasm of upper lobe, left bronchus or lung: Secondary | ICD-10-CM | POA: Diagnosis not present

## 2017-09-10 DIAGNOSIS — C7951 Secondary malignant neoplasm of bone: Secondary | ICD-10-CM | POA: Diagnosis not present

## 2017-09-10 DIAGNOSIS — D72823 Leukemoid reaction: Secondary | ICD-10-CM | POA: Diagnosis not present

## 2017-09-10 DIAGNOSIS — C787 Secondary malignant neoplasm of liver and intrahepatic bile duct: Secondary | ICD-10-CM | POA: Diagnosis not present

## 2017-09-10 DIAGNOSIS — C778 Secondary and unspecified malignant neoplasm of lymph nodes of multiple regions: Secondary | ICD-10-CM | POA: Diagnosis not present

## 2017-09-10 DIAGNOSIS — Z5111 Encounter for antineoplastic chemotherapy: Secondary | ICD-10-CM | POA: Diagnosis not present

## 2017-09-10 MED ORDER — FOSAPREPITANT DIMEGLUMINE INJECTION 150 MG
Freq: Once | INTRAVENOUS | Status: AC
  Administered 2017-09-10: 13:00:00 via INTRAVENOUS
  Filled 2017-09-10: qty 5

## 2017-09-10 MED ORDER — SODIUM CHLORIDE 0.9% FLUSH
10.0000 mL | INTRAVENOUS | Status: DC | PRN
  Administered 2017-09-10: 10 mL
  Filled 2017-09-10: qty 10

## 2017-09-10 MED ORDER — ETOPOSIDE CHEMO INJECTION 1 GM/50ML
80.0000 mg/m2 | Freq: Once | INTRAVENOUS | Status: AC
  Administered 2017-09-10: 100 mg via INTRAVENOUS
  Filled 2017-09-10: qty 5

## 2017-09-10 MED ORDER — PALONOSETRON HCL INJECTION 0.25 MG/5ML
0.2500 mg | Freq: Once | INTRAVENOUS | Status: AC
  Administered 2017-09-10: 0.25 mg via INTRAVENOUS

## 2017-09-10 MED ORDER — HEPARIN SOD (PORK) LOCK FLUSH 100 UNIT/ML IV SOLN
500.0000 [IU] | Freq: Once | INTRAVENOUS | Status: DC | PRN
  Filled 2017-09-10: qty 5

## 2017-09-10 MED ORDER — PALONOSETRON HCL INJECTION 0.25 MG/5ML
INTRAVENOUS | Status: AC
  Filled 2017-09-10: qty 5

## 2017-09-10 MED ORDER — PEGFILGRASTIM 6 MG/0.6ML ~~LOC~~ PSKT
PREFILLED_SYRINGE | SUBCUTANEOUS | Status: AC
  Filled 2017-09-10: qty 0.6

## 2017-09-10 MED ORDER — PEGFILGRASTIM 6 MG/0.6ML ~~LOC~~ PSKT
6.0000 mg | PREFILLED_SYRINGE | Freq: Once | SUBCUTANEOUS | Status: AC
  Administered 2017-09-10: 6 mg via SUBCUTANEOUS

## 2017-09-10 MED ORDER — SODIUM CHLORIDE 0.9 % IV SOLN
Freq: Once | INTRAVENOUS | Status: AC
  Administered 2017-09-10: 13:00:00 via INTRAVENOUS

## 2017-09-10 NOTE — Patient Instructions (Signed)
[facility] Discharge Instructions for Patients Receiving Chemotherapy  Today you received the following chemotherapy agents: Etoposide   To help prevent nausea and vomiting after your treatment, we encourage you to take your nausea medication as prescribed.   If you develop nausea and vomiting that is not controlled by your nausea medication, call the clinic.   BELOW ARE SYMPTOMS THAT SHOULD BE REPORTED IMMEDIATELY:  *FEVER GREATER THAN 100.5 F  *CHILLS WITH OR WITHOUT FEVER  NAUSEA AND VOMITING THAT IS NOT CONTROLLED WITH YOUR NAUSEA MEDICATION  *UNUSUAL SHORTNESS OF BREATH  *UNUSUAL BRUISING OR BLEEDING  TENDERNESS IN MOUTH AND THROAT WITH OR WITHOUT PRESENCE OF ULCERS  *URINARY PROBLEMS  *BOWEL PROBLEMS  UNUSUAL RASH Items with * indicate a potential emergency and should be followed up as soon as possible.  Feel free to call the clinic should you have any questions or concerns. The clinic phone number is [phone].  Please show the [place] at check-in to the Emergency Department and triage nurse.

## 2017-09-12 ENCOUNTER — Encounter (HOSPITAL_COMMUNITY): Payer: Self-pay

## 2017-09-12 ENCOUNTER — Other Ambulatory Visit: Payer: Self-pay | Admitting: Oncology

## 2017-09-12 ENCOUNTER — Telehealth: Payer: Self-pay | Admitting: Medical Oncology

## 2017-09-12 ENCOUNTER — Other Ambulatory Visit: Payer: Self-pay

## 2017-09-12 ENCOUNTER — Emergency Department (HOSPITAL_COMMUNITY): Payer: Medicare Other

## 2017-09-12 ENCOUNTER — Inpatient Hospital Stay: Payer: Medicare Other | Admitting: Medical

## 2017-09-12 ENCOUNTER — Ambulatory Visit: Payer: Medicare Other

## 2017-09-12 ENCOUNTER — Emergency Department (HOSPITAL_COMMUNITY)
Admission: EM | Admit: 2017-09-12 | Discharge: 2017-09-12 | Disposition: A | Discharge status: Home / Self Care | Payer: Medicare Other | Attending: Emergency Medicine | Admitting: Emergency Medicine

## 2017-09-12 ENCOUNTER — Inpatient Hospital Stay (HOSPITAL_BASED_OUTPATIENT_CLINIC_OR_DEPARTMENT_OTHER): Payer: Medicare Other | Admitting: Medical

## 2017-09-12 ENCOUNTER — Other Ambulatory Visit: Payer: Self-pay | Admitting: Medical Oncology

## 2017-09-12 VITALS — BP 127/75 | HR 80 | Temp 98.9°F | Resp 18 | Ht 61.0 in | Wt 81.6 lb

## 2017-09-12 DIAGNOSIS — C7951 Secondary malignant neoplasm of bone: Secondary | ICD-10-CM | POA: Diagnosis not present

## 2017-09-12 DIAGNOSIS — C778 Secondary and unspecified malignant neoplasm of lymph nodes of multiple regions: Secondary | ICD-10-CM | POA: Diagnosis not present

## 2017-09-12 DIAGNOSIS — F17210 Nicotine dependence, cigarettes, uncomplicated: Secondary | ICD-10-CM | POA: Diagnosis not present

## 2017-09-12 DIAGNOSIS — Z5189 Encounter for other specified aftercare: Secondary | ICD-10-CM | POA: Diagnosis not present

## 2017-09-12 DIAGNOSIS — C3492 Malignant neoplasm of unspecified part of left bronchus or lung: Secondary | ICD-10-CM

## 2017-09-12 DIAGNOSIS — E860 Dehydration: Secondary | ICD-10-CM | POA: Diagnosis not present

## 2017-09-12 DIAGNOSIS — I2510 Atherosclerotic heart disease of native coronary artery without angina pectoris: Secondary | ICD-10-CM | POA: Insufficient documentation

## 2017-09-12 DIAGNOSIS — E119 Type 2 diabetes mellitus without complications: Secondary | ICD-10-CM | POA: Insufficient documentation

## 2017-09-12 DIAGNOSIS — I252 Old myocardial infarction: Secondary | ICD-10-CM | POA: Diagnosis not present

## 2017-09-12 DIAGNOSIS — R0989 Other specified symptoms and signs involving the circulatory and respiratory systems: Secondary | ICD-10-CM | POA: Diagnosis not present

## 2017-09-12 DIAGNOSIS — Z5111 Encounter for antineoplastic chemotherapy: Secondary | ICD-10-CM | POA: Diagnosis not present

## 2017-09-12 DIAGNOSIS — C3412 Malignant neoplasm of upper lobe, left bronchus or lung: Secondary | ICD-10-CM

## 2017-09-12 DIAGNOSIS — J449 Chronic obstructive pulmonary disease, unspecified: Secondary | ICD-10-CM | POA: Diagnosis not present

## 2017-09-12 DIAGNOSIS — R110 Nausea: Secondary | ICD-10-CM | POA: Diagnosis not present

## 2017-09-12 DIAGNOSIS — R5383 Other fatigue: Secondary | ICD-10-CM | POA: Diagnosis not present

## 2017-09-12 DIAGNOSIS — C787 Secondary malignant neoplasm of liver and intrahepatic bile duct: Secondary | ICD-10-CM

## 2017-09-12 DIAGNOSIS — I10 Essential (primary) hypertension: Secondary | ICD-10-CM | POA: Insufficient documentation

## 2017-09-12 DIAGNOSIS — D72823 Leukemoid reaction: Secondary | ICD-10-CM | POA: Diagnosis not present

## 2017-09-12 DIAGNOSIS — R42 Dizziness and giddiness: Secondary | ICD-10-CM | POA: Diagnosis not present

## 2017-09-12 DIAGNOSIS — Z85118 Personal history of other malignant neoplasm of bronchus and lung: Secondary | ICD-10-CM | POA: Insufficient documentation

## 2017-09-12 DIAGNOSIS — R531 Weakness: Secondary | ICD-10-CM | POA: Insufficient documentation

## 2017-09-12 LAB — URINALYSIS, ROUTINE W REFLEX MICROSCOPIC
BACTERIA UA: NONE SEEN
BILIRUBIN URINE: NEGATIVE
GLUCOSE, UA: NEGATIVE mg/dL
HGB URINE DIPSTICK: NEGATIVE
KETONES UR: 5 mg/dL — AB
NITRITE: NEGATIVE
PROTEIN: 100 mg/dL — AB
Specific Gravity, Urine: 1.024 (ref 1.005–1.030)
pH: 5 (ref 5.0–8.0)

## 2017-09-12 LAB — CBC WITH DIFFERENTIAL/PLATELET
BASOS ABS: 0 10*3/uL (ref 0.0–0.1)
Basophils Relative: 0 %
Eosinophils Absolute: 0 10*3/uL (ref 0.0–0.7)
Eosinophils Relative: 0 %
HCT: 27.2 % — ABNORMAL LOW (ref 36.0–46.0)
HEMOGLOBIN: 9.1 g/dL — AB (ref 12.0–15.0)
LYMPHS ABS: 4.6 10*3/uL — AB (ref 0.7–4.0)
LYMPHS PCT: 6 %
MCH: 31.4 pg (ref 26.0–34.0)
MCHC: 33.5 g/dL (ref 30.0–36.0)
MCV: 93.8 fL (ref 78.0–100.0)
MONOS PCT: 1 %
Monocytes Absolute: 0.8 10*3/uL (ref 0.1–1.0)
Neutro Abs: 70.8 10*3/uL — ABNORMAL HIGH (ref 1.7–7.7)
Neutrophils Relative %: 93 %
Platelets: 237 10*3/uL (ref 150–400)
RBC: 2.9 MIL/uL — AB (ref 3.87–5.11)
RDW: 18.5 % — ABNORMAL HIGH (ref 11.5–15.5)
WBC: 76.2 10*3/uL — AB (ref 4.0–10.5)

## 2017-09-12 LAB — COMPREHENSIVE METABOLIC PANEL
ALT: 14 U/L (ref 14–54)
AST: 17 U/L (ref 15–41)
Albumin: 3.1 g/dL — ABNORMAL LOW (ref 3.5–5.0)
Alkaline Phosphatase: 103 U/L (ref 38–126)
Anion gap: 11 (ref 5–15)
BUN: 29 mg/dL — ABNORMAL HIGH (ref 6–20)
CHLORIDE: 101 mmol/L (ref 101–111)
CO2: 25 mmol/L (ref 22–32)
CREATININE: 0.75 mg/dL (ref 0.44–1.00)
Calcium: 8.6 mg/dL — ABNORMAL LOW (ref 8.9–10.3)
Glucose, Bld: 104 mg/dL — ABNORMAL HIGH (ref 65–99)
POTASSIUM: 4.4 mmol/L (ref 3.5–5.1)
Sodium: 137 mmol/L (ref 135–145)
Total Bilirubin: 0.3 mg/dL (ref 0.3–1.2)
Total Protein: 6.1 g/dL — ABNORMAL LOW (ref 6.5–8.1)

## 2017-09-12 LAB — CBC WITH DIFFERENTIAL (CANCER CENTER ONLY)
BASOS ABS: 0.1 10*3/uL (ref 0.0–0.1)
Basophils Relative: 0 %
EOS PCT: 0 %
Eosinophils Absolute: 0 10*3/uL (ref 0.0–0.5)
HEMATOCRIT: 30.4 % — AB (ref 34.8–46.6)
HEMOGLOBIN: 10 g/dL — AB (ref 11.6–15.9)
LYMPHS PCT: 4 %
Lymphs Abs: 3 10*3/uL (ref 0.9–3.3)
MCH: 30.5 pg (ref 25.1–34.0)
MCHC: 32.9 g/dL (ref 31.5–36.0)
MCV: 92.9 fL (ref 79.5–101.0)
Monocytes Absolute: 0.7 10*3/uL (ref 0.1–0.9)
Monocytes Relative: 1 %
NEUTROS ABS: 73.3 10*3/uL — AB (ref 1.5–6.5)
NEUTROS PCT: 95 %
PLATELETS: 256 10*3/uL (ref 145–400)
RBC: 3.27 MIL/uL — AB (ref 3.70–5.45)
RDW: 19.4 % — ABNORMAL HIGH (ref 11.2–14.5)
WBC: 77.2 10*3/uL — AB (ref 3.9–10.3)

## 2017-09-12 LAB — CMP (CANCER CENTER ONLY)
ALK PHOS: 128 U/L (ref 40–150)
ALT: 8 U/L (ref 0–55)
AST: 13 U/L (ref 5–34)
Albumin: 3.1 g/dL — ABNORMAL LOW (ref 3.5–5.0)
Anion gap: 9 (ref 3–11)
BILIRUBIN TOTAL: 0.3 mg/dL (ref 0.2–1.2)
BUN: 27 mg/dL — AB (ref 7–26)
CALCIUM: 9 mg/dL (ref 8.4–10.4)
CHLORIDE: 99 mmol/L (ref 98–109)
CO2: 25 mmol/L (ref 22–29)
CREATININE: 0.88 mg/dL (ref 0.60–1.10)
Glucose, Bld: 243 mg/dL — ABNORMAL HIGH (ref 70–140)
Potassium: 4.3 mmol/L (ref 3.5–5.1)
Sodium: 133 mmol/L — ABNORMAL LOW (ref 136–145)
Total Protein: 6.4 g/dL (ref 6.4–8.3)

## 2017-09-12 LAB — LIPASE, BLOOD: LIPASE: 33 U/L (ref 11–51)

## 2017-09-12 LAB — SAMPLE TO BLOOD BANK

## 2017-09-12 MED ORDER — HEPARIN SOD (PORK) LOCK FLUSH 100 UNIT/ML IV SOLN
500.0000 [IU] | Freq: Once | INTRAVENOUS | Status: AC
  Administered 2017-09-12: 500 [IU]
  Filled 2017-09-12: qty 5

## 2017-09-12 MED ORDER — SODIUM CHLORIDE 0.9 % IV SOLN
Freq: Once | INTRAVENOUS | Status: AC
  Administered 2017-09-12: 17:00:00 via INTRAVENOUS

## 2017-09-12 MED ORDER — SODIUM CHLORIDE 0.9 % IV SOLN: INTRAVENOUS | Status: DC

## 2017-09-12 MED ORDER — SODIUM CHLORIDE 0.9 % IV SOLN
2.0000 g | Freq: Two times a day (BID) | INTRAVENOUS | Status: DC
  Administered 2017-09-12: 2 g via INTRAVENOUS
  Filled 2017-09-12: qty 2

## 2017-09-12 MED ORDER — ONDANSETRON HCL 4 MG/2ML IJ SOLN
4.0000 mg | Freq: Once | INTRAMUSCULAR | Status: AC
  Administered 2017-09-12: 4 mg via INTRAVENOUS
  Filled 2017-09-12: qty 2

## 2017-09-12 NOTE — ED Triage Notes (Signed)
Patient c/o weakness, fatigue, dizziness, nausea for 2 days. Denies vomiting/diarrhea/fevers. Hx stage 4 small cell lung cancer. Patient seen at doctors office, WBC 77.2. Here for admission. 2 site blood cultures obtained and cefepime given.

## 2017-09-12 NOTE — Progress Notes (Addendum)
[facility] Progress Note   Katherine Cole [id number] [birth year] 77 y.o.  Katherine Cole is managed by Dr. Fanny Bien. 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  Of note the patient did receive Neulasta on 09/10/2017.  She is previously received this medication but is never presented with such a robust response.  Based on this and based on the patient's report that she "has never felt this bad while getting treatment" she was transported to the emergency room for evaluation and management.  Blood cultures x2 were collected.  The patient was dosed with cefepime 2 g IV x1.  Extensive stage small cell carcinoma of the left lung: The patient is status post cycle 1, day 1-3 of carboplatin and etoposide which was initiated on 09/08/2017.  Please see After Visit Summary for patient specific instructions.  Future Appointments  Date Time Provider [place]  09/15/2017  3:15 PM [facility] LAB 6 [facility] None  09/15/2017  3:45 PM [facility] FLUSH NURSE 2 [facility] None  09/25/2017 10:30 AM [facility] 1 [facility] [place]  09/29/2017 12:00 PM [facility] LAB 3 [facility] None  09/29/2017 12:15 PM [facility] FLUSH NURSE [facility] None  09/29/2017  1:00 PM Curcio, Kristin R, NP [facility] None  09/29/2017  2:00 PM [facility] I26 DNS [facility] None  09/30/2017  3:00 PM [facility] F20 [facility] None  09/30/2017  3:15 PM Jennet Maduro, RD [facility] None  10/01/2017  3:00 PM [facility] C9 [facility] None  10/20/2017  12:15 PM [facility] LAB 3 [facility] None  10/20/2017 12:30 PM [facility] FLUSH NURSE [facility] None  10/20/2017  1:00 PM Cira Rue K, NP [facility] None  10/20/2017  2:00 PM [facility] I25 DNS [facility] None  10/21/2017  3:00 PM [facility] H30 [facility] None  10/22/2017  3:00 PM [facility] F20 [facility] None  11/10/2017 12:45 PM [facility] LAB 4 [facility] None  11/10/2017  1:00 PM [facility] J32 DNS [facility] None  11/10/2017  1:45 PM Curt Bears, MD [facility] None  11/10/2017  2:30 PM [facility] G22 [facility] None  11/11/2017  3:00 PM [facility] C10 [facility] None  11/12/2017  3:00 PM [facility] B7 [facility] None    Orders Placed This Encounter  Procedures  . Culture, Blood  . Culture, Blood       Subjective:   Patient ID:  Katherine Cole is a 77 y.o. (DOB 1940/08/19) female.  Chief Complaint: No chief complaint on file.   HPI Katherine Cole is a 77 year old female with an extensive stage (T2 a, N2, M1c) small cell lung cancer.  She originally presented with a subpleural mass within the left upper lobe, associated ipsilateral hilar and mediastinal nodal metastasis, liver metastasis and bone metastasis.  She continues to be managed by Dr. Earlie Server and was last treated with cycle 3 of carboplatin and etoposide on 09/08/2017.  She received Neulasta on 09/10/2017.  She reports significant weakness, dizziness, nausea, and a nonproductive cough.  She states that she has never felt as poorly after chemotherapy as she has after this cycle.  She denies fevers,  chills, sweats, vomiting, constipation, and diarrhea.  Medications: I have reviewed the patient's current medications.  Allergies:  Allergies  Allergen Reactions  . Codeine Other (See Comments)    "gives me a migraine" (03/11/2012)  . Morphine And Related Other (See Comments)    "gives me a migraine" (03/11/2012)    Past Medical History:  Diagnosis Date  . Arthritis    "in my fingers"   . CAD  (coronary artery disease)    a. MI 1999 with 2 stents at that time followed by stenting 3-4 yrs later. b. s/p DES to LAD guided by pressure wire 03/11/12.  . Colon polyps    adenomatous  . Depression   . Diabetes mellitus, type 2 ([place])   . Hyperlipemia   . Hypertension   . Osteoarthritis   . Pneumonia 06/2017  . Skin cancer 2012   "nose; right middle finger"  . Stroke [facility])     Past Surgical History:  Procedure Laterality Date  . APPENDECTOMY  ~ 1965  . CATARACT EXTRACTION    . CORONARY ANGIOPLASTY WITH STENT PLACEMENT  1999; 2004; 03/11/2012   "2 + 1 + 1; total of 4" (03/11/2012)  . FEMUR FRACTURE SURGERY  2011   RLE (03/11/2012)  . IR FLUORO GUIDE PORT INSERTION RIGHT  07/18/2017  . IR US GUIDE VASC ACCESS RIGHT  07/18/2017  . PERCUTANEOUS CORONARY STENT INTERVENTION (PCI-S) N/A 03/11/2012   Procedure: PERCUTANEOUS CORONARY STENT INTERVENTION (PCI-S);  Surgeon: Sherren Mocha, MD;  Location: [facility] CATH LAB;  Service: Cardiovascular;  Laterality: N/A;  . SKIN CANCER EXCISION  2013   "nose & right middle finger; actinic keratosis" (03/11/2012)  . TONSILLECTOMY AND ADENOIDECTOMY  ~ 1962  . TUBAL LIGATION  1977  . VIDEO BRONCHOSCOPY WITH ENDOBRONCHIAL ULTRASOUND N/A 07/04/2017   Procedure: VIDEO BRONCHOSCOPY WITH ENDOBRONCHIAL ULTRASOUND;  Surgeon: Collene Gobble, MD;  Location: [facility] OR;  Service: Thoracic;  Laterality: N/A;    Family History  Problem Relation Age of Onset  . Liver cancer Mother   . Brain cancer Father   . Stomach cancer Father   . Colon cancer Brother 39  . Lung cancer Sister   . Lung cancer Brother   . Stomach cancer Brother   . Diabetes Daughter     Social History   Socioeconomic History  . Marital status: Married    Spouse name: Not on file  . Number of children: 3  . Years of education: Not on file  . Highest education level: Not on file  Occupational History  . Occupation: Retired  Scientific laboratory technician  . Financial resource strain: Not on file  . Food  insecurity:    Worry: Not on file    Inability: Not on file  . Transportation needs:    Medical: Not on file    Non-medical: Not on file  Tobacco Use  . Smoking status: Current Every Day Smoker    Packs/day: 0.50    Years: 51.00    Pack years: 25.50    Types: Cigarettes  . Smokeless tobacco: Never Used  Substance and Sexual Activity  . Alcohol use: No  . Drug use: No  . Sexual activity: Never  Lifestyle  . Physical activity:    Days per week: Not on file    Minutes per session: Not on file  . Stress: Not on file  Relationships  . Social connections:    Talks on phone: Not on file    Gets together: Not on file  Attends religious service: Not on file    Active member of club or organization: Not on file    Attends meetings of clubs or organizations: Not on file    Relationship status: Not on file  . Intimate partner violence:    Fear of current or ex partner: Not on file    Emotionally abused: Not on file    Physically abused: Not on file    Forced sexual activity: Not on file  Other Topics Concern  . Not on file  Social History Narrative   Grandson lives with her.      Past Medical History, Surgical history, Social history, and Family history were reviewed and updated as appropriate.   Please see review of systems for further details on the patient's review from today.   Review of Systems:  Review of Systems  Constitutional: Positive for fatigue. Negative for chills, diaphoresis and fever.  HENT: Negative for rhinorrhea and voice change.   Respiratory: Positive for cough. Negative for chest tightness, shortness of breath and wheezing.   Cardiovascular: Negative for chest pain, palpitations and leg swelling.  Gastrointestinal: Positive for nausea. Negative for constipation, diarrhea and vomiting.  Neurological: Positive for dizziness and weakness. Negative for headaches.    Objective:   Physical Exam:  BP 127/75 (BP Location: Right Arm, Patient Position:  Sitting)   Pulse 80   Temp 98.9 F (37.2 C) (Oral)   Resp 18   Ht 5\' 1"  (1.549 m)   Wt 81 lb 9.6 oz (37 kg)   SpO2 99%   BMI 15.42 kg/m  ECOG: 1  Physical Exam  Constitutional: No distress.  HENT:  Head: Normocephalic and atraumatic.  Mouth/Throat: Oropharynx is clear and moist. No oropharyngeal exudate.  Cardiovascular: Normal rate, regular rhythm and normal heart sounds. Exam reveals no gallop and no friction rub.  No murmur heard. A right chest wall Port-A-Cath was noted.  No erythema or exudate noted.  Pulmonary/Chest: Effort normal and breath sounds normal. No stridor. No respiratory distress. She has no wheezes. She has no rales.  Abdominal: Bowel sounds are normal. She exhibits no distension and no mass. There is no tenderness. There is no rebound and no guarding.  Neurological: She is alert. Coordination normal.  Skin: Skin is warm and dry. She is not diaphoretic. No erythema. No pallor.  Psychiatric: She has a normal mood and affect. Her behavior is normal. Judgment and thought content normal.    Lab Review:     Component Value Date/Time   NA 133 (L) 09/12/2017 1457   K 4.3 09/12/2017 1457   CL 99 09/12/2017 1457   CO2 25 09/12/2017 1457   GLUCOSE 243 (H) 09/12/2017 1457   BUN 27 (H) 09/12/2017 1457   CREATININE 0.88 09/12/2017 1457   CALCIUM 9.0 09/12/2017 1457   PROT 6.4 09/12/2017 1457   ALBUMIN 3.1 (L) 09/12/2017 1457   AST 13 09/12/2017 1457   ALT 8 09/12/2017 1457   ALKPHOS 128 09/12/2017 1457   BILITOT 0.3 09/12/2017 1457   GFRNONAA >60 09/12/2017 1457   GFRAA >60 09/12/2017 1457       Component Value Date/Time   WBC 77.2 (HH) 09/12/2017 1457   WBC 8.6 08/26/2017 2319   RBC 3.27 (L) 09/12/2017 1457   HGB 10.0 (L) 09/12/2017 1457   HCT 30.4 (L) 09/12/2017 1457   HCT 27.3 (L) 08/04/2017 1704   PLT 256 09/12/2017 1457   MCV 92.9 09/12/2017 1457   MCH 30.5 09/12/2017 1457  MCHC 32.9 09/12/2017 1457   RDW 19.4 (H) 09/12/2017 1457   LYMPHSABS  3.0 09/12/2017 1457   MONOABS 0.7 09/12/2017 1457   EOSABS 0.0 09/12/2017 1457   BASOSABS 0.1 09/12/2017 1457   -------------------------------  Imaging from last 24 hours (if applicable):  Radiology interpretation: Dg Chest 2 View  Result Date: 08/26/2017 CLINICAL DATA:  Cancer patient on chemotherapy with fever and lethargy. EXAM: CHEST - 2 VIEW COMPARISON:  08/14/2017 FINDINGS: Heart size is normal. There is moderate aortic atherosclerosis without aneurysmal dilatation. Port catheter tip terminates in the mid to distal SVC. No pulmonary consolidation, effusion or pneumothorax. Minimal atelectasis and/or scarring at the lung bases. Soft tissue calcifications adjacent to the humeral heads bilaterally compatible with calcific rotator cuff tendinopathy. IMPRESSION: Stable appearance of the chest with bibasilar atelectasis. No active pulmonary disease. Aortic atherosclerosis. Electronically Signed   By: Ashley Royalty M.D.   On: 08/26/2017 23:46   Dg Chest 2 View  Result Date: 08/14/2017 CLINICAL DATA:  77 year old female with weakness. EXAM: CHEST - 2 VIEW COMPARISON:  Chest radiograph dated 08/13/2017 FINDINGS: Right pectoral Port-A-Cath with tip at the cavoatrial junction. Diffuse interstitial coarsening and bronchitic changes. There is no focal consolidation, pleural effusion, or pneumothorax. The cardiac silhouette is within normal limits. Coronary stents noted. There is osteopenia with degenerative changes of the spine. No acute osseous pathology. IMPRESSION: No active cardiopulmonary disease. Electronically Signed   By: Anner Crete M.D.   On: 08/14/2017 21:33   Dg Chest 2 View  Result Date: 08/13/2017 CLINICAL DATA:  Patient hospitalized last week for pneumonia with persistent productive cough. History of left upper lobe lung cancer. Patient currently on chemotherapy. EXAM: CHEST - 2 VIEW COMPARISON:  08/07/2017 and chest CT 08/08/2017 FINDINGS: Right IJ Port-A-Cath unchanged. Lungs are  adequately inflated with no significant change in patient's peripheral lateral left upper lobe lung cancer. No definite focal airspace consolidation. Mild prominence of the central perihilar markings which may indicate a mild degree of vascular congestion. No effusion. Cardiomediastinal silhouette and remainder of the exam is unchanged. IMPRESSION: No evidence of pneumonia. Suggestion mild vascular congestion. Known stable lung cancer over the lateral left upper lobe. Electronically Signed   By: Marin Olp M.D.   On: 08/13/2017 19:18

## 2017-09-12 NOTE — Discharge Instructions (Addendum)
As discussed, your evaluation today has been largely reassuring.  But, it is important that you monitor your condition carefully, and do not hesitate to return to the ED if you develop new, or concerning changes in your condition. ? ?Otherwise, please follow-up with your physician for appropriate ongoing care. ? ?

## 2017-09-12 NOTE — ED Provider Notes (Signed)
[facility] DEPT Provider Note   CSN: [id number] Arrival date & time: 09/12/17  1718     History   Chief Complaint Chief Complaint  Patient presents with  . Weakness  . Fatigue  . Abnormal Lab    HPI Katherine Cole is a 77 y.o. female.  HPI  Elderly female presents with concern of weakness and leukocytosis. 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  Patient has had ongoing anorexia, nausea, some vomiting, though not consistently. Patient has been using Zofran, Compazine for nausea control.  Not she continues to tolerate 3 boost protein shakes daily. She is here with her daughter who assists with the HPI. They deny new fever, confusion, disorientation, new dyspnea, new cough. Patient reportedly had leukocytosis, and with concern for infection received cefepime, had blood cultures drawn, and was sent here for evaluation.    Past Medical History:  Diagnosis Date  . Arthritis    "in my fingers"   . CAD (coronary artery disease)    a. MI 1999 with 2 stents at that time followed by stenting 3-4 yrs later. b. s/p DES to LAD guided by pressure wire 03/11/12.  . Colon polyps    adenomatous  . Depression   . Diabetes mellitus, type 2 ([place])   . Hyperlipemia   . Hypertension   . Osteoarthritis   . Pneumonia 06/2017  . Skin cancer 2012   "nose; right middle finger"  . Stroke [facility])     Patient Active Problem List   Diagnosis Date Noted  . COPD II still smoking  08/21/2017  . Dehydration 08/15/2017  . Antineoplastic chemotherapy induced pancytopenia ([place]) 08/03/2017  . Pancytopenia ([place]) 08/03/2017  . DNR (do not resuscitate) 07/29/2017  . Port-A-Cath in place 07/22/2017  . Encounter for antineoplastic chemotherapy 07/12/2017  . Goals of care, counseling/discussion 07/12/2017  . Moderate protein-calorie malnutrition ([place]) 07/12/2017  . Encounter for smoking cessation counseling 07/12/2017  .  Primary small cell carcinoma of left lung ([place]) 07/12/2017  . Primary malignant neoplasm of left upper lobe of lung ([place]) 07/08/2017  . Atypical pneumonia   . Acute CVA (cerebrovascular accident) ([place]) 01/20/2014  . Exertional angina ([place]) 03/12/2012  . CAD (coronary artery disease) 02/10/2012  . Cigarette smoker 02/10/2012  . Essential hypertension 02/10/2012  . Benign neoplasm of colon 08/01/2007  . Type 2 diabetes mellitus with hypoglycemia without coma ([place]) 08/01/2007  . Hyperlipidemia 08/01/2007  . GASTROESOPHAGEAL REFLUX DISEASE 08/01/2007  . OSTEOARTHRITIS 08/01/2007    Past Surgical History:  Procedure Laterality Date  . APPENDECTOMY  ~ 1965  . CATARACT EXTRACTION    . CORONARY ANGIOPLASTY WITH STENT PLACEMENT  1999; 2004; 03/11/2012   "2 + 1 + 1; total of 4" (03/11/2012)  . FEMUR FRACTURE SURGERY  2011   RLE (03/11/2012)  . IR FLUORO GUIDE PORT INSERTION RIGHT  07/18/2017  . IR US GUIDE VASC ACCESS RIGHT  07/18/2017  . PERCUTANEOUS CORONARY STENT INTERVENTION (PCI-S) N/A 03/11/2012   Procedure: PERCUTANEOUS CORONARY STENT INTERVENTION (PCI-S);  Surgeon: Sherren Mocha, MD;  Location: [facility] CATH LAB;  Service: Cardiovascular;  Laterality: N/A;  . SKIN CANCER EXCISION  2013   "nose & right middle finger; actinic keratosis" (03/11/2012)  . TONSILLECTOMY AND ADENOIDECTOMY  ~ 1962  . TUBAL LIGATION  1977  . VIDEO BRONCHOSCOPY WITH ENDOBRONCHIAL ULTRASOUND N/A 07/04/2017   Procedure: VIDEO BRONCHOSCOPY WITH ENDOBRONCHIAL ULTRASOUND;  Surgeon: Collene Gobble, MD;  Location: [place];  Service:  Thoracic;  Laterality: N/A;     OB History   None      Home Medications    Prior to Admission medications   Medication Sig Start Date End Date Taking? Authorizing Provider  ALPRAZolam Duanne Moron) 0.25 MG tablet Take 0.25 mg by mouth at bedtime as needed for anxiety.   Yes [provider]  amLODipine (NORVASC) 10 MG tablet Take 1 tablet (10 mg total) by mouth daily. 08/11/17  Yes Kayleen Memos, DO  aspirin 81 MG tablet Take 1 tablet (81 mg total) by mouth daily. Patient taking differently: Take 81 mg by mouth as directed. On mondays and Thursdays 01/21/14  Yes Annita Brod, MD  atorvastatin (LIPITOR) 80 MG tablet Take 80 mg by mouth daily.   Yes [provider]  clopidogrel (PLAVIX) 75 MG tablet Take 75 mg by mouth daily.   Yes [provider]  fluticasone (FLONASE) 50 MCG/ACT nasal spray Place 1 spray into both nostrils daily. 08/09/17  Yes Ollis, Cleaster Corin, NP  guaiFENesin (MUCINEX) 600 MG 12 hr tablet Take 2 tablets (1,200 mg total) by mouth 2 (two) times daily as needed (Use for one week post discharge, then twice daily as needed). Patient taking differently: Take 1,200 mg by mouth 2 (two) times daily as needed for cough (Use for one week post discharge, then twice daily as needed).  08/09/17  Yes Donita Brooks, NP  HUMALOG KWIKPEN 100 UNIT/ML KiwkPen 0-8 units three times daily after meals 07/23/17  Yes [provider]  ipratropium-albuterol (DUONEB) 0.5-2.5 (3) MG/3ML SOLN Take 3 mLs by nebulization every 6 (six) hours as needed (Shortness of breath or wheezing). 08/09/17  Yes Ollis, Brandi L, NP  lactose free nutrition (BOOST PLUS) LIQD Take 237 mLs by mouth 3 (three) times daily between meals. 08/10/17  Yes Hall, Carole N, DO  loratadine (CLARITIN) 10 MG tablet Take 10 mg by mouth daily.   Yes [provider]  metFORMIN (GLUCOPHAGE) 1000 MG tablet Take 1,000 mg by mouth 2 (two) times daily with a meal.   Yes [provider]  metoprolol succinate (TOPROL-XL) 50 MG 24 hr tablet Take 50 mg by mouth daily. Take with or immediately following a meal.   Yes [provider]  ondansetron (ZOFRAN ODT) 4 MG disintegrating tablet Take 1 tablet (4 mg total) by mouth every 8 (eight) hours as needed for nausea or vomiting. 08/28/17  Yes Tanner, Lyndon Code., PA-C  prochlorperazine (COMPAZINE) 10 MG tablet Take 1 tablet (10 mg total) by mouth  every 6 (six) hours as needed for nausea or vomiting. 07/22/17  Yes Curt Bears, MD  promethazine (PHENERGAN) 12.5 MG tablet Take 1 tablet (12.5 mg total) by mouth every 4 (four) hours as needed for nausea or vomiting. 08/27/17  Yes Horton, Barbette Hair, MD  sitaGLIPtin (JANUVIA) 100 MG tablet Take 0.5 tablets (50 mg total) by mouth daily. 08/10/17  Yes Kayleen Memos, DO  nitroGLYCERIN (NITROSTAT) 0.4 MG SL tablet Place 1 tablet (0.4 mg total) under the tongue every 5 (five) minutes as needed (up to 3 doses). For chest pain 03/12/12   Dunn, Nedra Hai, PA-C  sodium chloride (OCEAN) 0.65 % SOLN nasal spray Place 1 spray into both nostrils as needed for congestion (use twice daily for one week post discharge, then as needed). Patient not taking: Reported on 09/12/2017 08/09/17   Donita Brooks, NP  sulfamethoxazole-trimethoprim (BACTRIM DS,SEPTRA DS) 800-160 MG tablet Take 1 tablet by mouth 2 (two) times  daily. Patient not taking: Reported on 09/12/2017 08/28/17   Harle Stanford., PA-C    Family History Family History  Problem Relation Age of Onset  . Liver cancer Mother   . Brain cancer Father   . Stomach cancer Father   . Colon cancer Brother 15  . Lung cancer Sister   . Lung cancer Brother   . Stomach cancer Brother   . Diabetes Daughter     Social History Social History   Tobacco Use  . Smoking status: Current Every Day Smoker    Packs/day: 0.50    Years: 51.00    Pack years: 25.50    Types: Cigarettes  . Smokeless tobacco: Never Used  Substance Use Topics  . Alcohol use: No  . Drug use: No     Allergies   Codeine and Morphine and related   Review of Systems Review of Systems  Constitutional:       Per HPI, otherwise negative  HENT:       Per HPI, otherwise negative  Respiratory:       Per HPI, otherwise negative  Cardiovascular:       Per HPI, otherwise negative  Gastrointestinal: Negative for vomiting.  Endocrine:       Negative aside from HPI  Genitourinary:        Neg aside from HPI   Musculoskeletal:       Per HPI, otherwise negative  Skin: Negative.   Allergic/Immunologic: Positive for immunocompromised state.  Neurological: Positive for weakness. Negative for syncope.     Physical Exam Updated Vital Signs BP (!) 124/59 (BP Location: Right Arm)   Pulse 67   Temp 98.6 F (37 C) (Oral)   Resp 18   Ht 5\' 1"  (1.549 m)   Wt 36.7 kg (81 lb)   SpO2 98%   BMI 15.30 kg/m   Physical Exam  Constitutional: She is oriented to person, place, and time. She has a sickly appearance. No distress.  HENT:  Head: Normocephalic and atraumatic.  Eyes: Conjunctivae and EOM are normal.  Cardiovascular: Normal rate and regular rhythm.  Pulmonary/Chest: Effort normal and breath sounds normal. No stridor. No respiratory distress.  Abdominal: She exhibits no distension.  Musculoskeletal: She exhibits no edema.  Neurological: She is alert and oriented to person, place, and time. She displays atrophy. No cranial nerve deficit.  Skin: Skin is warm and dry.  Psychiatric: She has a normal mood and affect.  Nursing note and vitals reviewed.    ED Treatments / Results  Labs (all labs ordered are listed, but only abnormal results are displayed) Labs Reviewed  COMPREHENSIVE METABOLIC PANEL - Abnormal; Notable for the following components:      Result Value   Glucose, Bld 104 (*)    BUN 29 (*)    Calcium 8.6 (*)    Total Protein 6.1 (*)    Albumin 3.1 (*)    All other components within normal limits  CBC WITH DIFFERENTIAL/PLATELET - Abnormal; Notable for the following components:   WBC 76.2 (*)    RBC 2.90 (*)    Hemoglobin 9.1 (*)    HCT 27.2 (*)    RDW 18.5 (*)    Neutro Abs 70.8 (*)    Lymphs Abs 4.6 (*)    All other components within normal limits  URINALYSIS, ROUTINE W REFLEX MICROSCOPIC - Abnormal; Notable for the following components:   APPearance HAZY (*)    Ketones, ur 5 (*)    Protein, ur 100 (*)  Leukocytes, UA TRACE (*)    All other  components within normal limits  LIPASE, BLOOD    EKG None  Radiology Dg Chest Portable 1 View  Result Date: 09/12/2017 CLINICAL DATA:  Fatigue, weakness, dizziness and nausea for the past 2 days. Stage IV small cell lung cancer. Marked leukocytosis. EXAM: PORTABLE CHEST 1 VIEW COMPARISON:  08/26/2017.  Chest CT dated 08/08/2017. FINDINGS: Interval irregular appearance of the left hilum. Increased pleural based density in the left lateral upper chest. Clear right lung. The lungs remain hyperexpanded with mildly prominent interstitial markings. Diffuse osteopenia. Coronary artery stent. IMPRESSION: 1. Interval increase in size of the patient's known malignancy in the lateral aspect of the left upper lobe. 2. Interval irregular appearance of the left hilum. This could be due to an interval mass or superimposed densities. 3. Stable changes of COPD. Electronically Signed   By: Claudie Revering M.D.   On: 09/12/2017 18:22    Procedures Procedures (including critical care time)  Medications Ordered in ED Medications  0.9 %  sodium chloride infusion ( Intravenous Rate/Dose Change 09/12/17 1736)  ondansetron (ZOFRAN) injection 4 mg (4 mg Intravenous Given 09/12/17 1740)  heparin lock flush 100 unit/mL (500 Units Intracatheter Given 09/12/17 2137)     Initial Impression / Assessment and Plan / ED Course  I have reviewed the triage vital signs and the nursing notes.  Pertinent labs & imaging results that were available during my care of the patient were reviewed by me and considered in my medical decision making (see chart for details).    9:45 PM Patient has remained calm throughout hours of monitoring, no substantial electrolyte abnormalities, no hemodynamic changes, no additional vomiting, no diarrhea She has received fluid resuscitation, and we have discussed findings as they have been available. Without evidence for obvious infection, with some consideration of progression of her disease, given  x-ray suggesting new increase in size of a left lateral thoracic mass, and perihilar mass, but without evidence for new hypotension, bacteremia, sepsis, patient prepared for discharge, with follow-up as scheduled in 72 hours with oncology. Here the patient is afebrile, and given the aforementioned reassuring labs, vitals, and after conversation and return precautions, and after blood cultures have been sent should she indeed be bacteremic, the patient was discharged in stable condition. Final Clinical Impressions(s) / ED Diagnoses   Final diagnoses:  Weakness     Carmin Muskrat, MD 09/12/17 2147

## 2017-09-12 NOTE — ED Notes (Signed)
Bed: OE78 Expected date:  Expected time:  Means of arrival:  Comments: [facility]- Neulasta reaction

## 2017-09-12 NOTE — Telephone Encounter (Signed)
Pt feels severely weak today . She is able to walk to bathroom and when she gets up to walk she is lightheaded and dizzy. Chemo /neulasta this weak. Labs and [facility] today. appts given,

## 2017-09-12 NOTE — ED Notes (Signed)
Date and time results received: 09/12/17 1839 (use smartphrase ".now" to insert current time)  Test: WBC Critical Value: 76.2  Name of Provider Notified: Vanita Panda  Orders Received? Or Actions Taken?: Actions Taken: Engineer, structural and EDP

## 2017-09-15 ENCOUNTER — Inpatient Hospital Stay: Payer: Medicare Other

## 2017-09-15 ENCOUNTER — Telehealth: Payer: Self-pay | Admitting: Medical Oncology

## 2017-09-15 DIAGNOSIS — C7951 Secondary malignant neoplasm of bone: Secondary | ICD-10-CM | POA: Diagnosis not present

## 2017-09-15 DIAGNOSIS — Z95828 Presence of other vascular implants and grafts: Secondary | ICD-10-CM

## 2017-09-15 DIAGNOSIS — Z5189 Encounter for other specified aftercare: Secondary | ICD-10-CM | POA: Diagnosis not present

## 2017-09-15 DIAGNOSIS — R0989 Other specified symptoms and signs involving the circulatory and respiratory systems: Secondary | ICD-10-CM | POA: Diagnosis not present

## 2017-09-15 DIAGNOSIS — R42 Dizziness and giddiness: Secondary | ICD-10-CM | POA: Diagnosis not present

## 2017-09-15 DIAGNOSIS — C3412 Malignant neoplasm of upper lobe, left bronchus or lung: Secondary | ICD-10-CM | POA: Diagnosis not present

## 2017-09-15 DIAGNOSIS — D72823 Leukemoid reaction: Secondary | ICD-10-CM | POA: Diagnosis not present

## 2017-09-15 DIAGNOSIS — C3492 Malignant neoplasm of unspecified part of left bronchus or lung: Secondary | ICD-10-CM

## 2017-09-15 DIAGNOSIS — R531 Weakness: Secondary | ICD-10-CM | POA: Diagnosis not present

## 2017-09-15 DIAGNOSIS — C787 Secondary malignant neoplasm of liver and intrahepatic bile duct: Secondary | ICD-10-CM | POA: Diagnosis not present

## 2017-09-15 DIAGNOSIS — E860 Dehydration: Secondary | ICD-10-CM | POA: Diagnosis not present

## 2017-09-15 DIAGNOSIS — Z5111 Encounter for antineoplastic chemotherapy: Secondary | ICD-10-CM | POA: Diagnosis not present

## 2017-09-15 DIAGNOSIS — C778 Secondary and unspecified malignant neoplasm of lymph nodes of multiple regions: Secondary | ICD-10-CM | POA: Diagnosis not present

## 2017-09-15 LAB — CMP (CANCER CENTER ONLY)
ALBUMIN: 3.4 g/dL — AB (ref 3.5–5.0)
ALT: 12 U/L (ref 0–55)
AST: 14 U/L (ref 5–34)
Alkaline Phosphatase: 177 U/L — ABNORMAL HIGH (ref 40–150)
Anion gap: 10 (ref 3–11)
BUN: 20 mg/dL (ref 7–26)
CHLORIDE: 101 mmol/L (ref 98–109)
CO2: 26 mmol/L (ref 22–29)
Calcium: 9 mg/dL (ref 8.4–10.4)
Creatinine: 0.78 mg/dL (ref 0.60–1.10)
GFR, Est AFR Am: >60 mL/min (ref >60)
GFR, Estimated: >60 mL/min (ref >60)
GLUCOSE: 241 mg/dL — AB (ref 70–140)
POTASSIUM: 4.1 mmol/L (ref 3.5–5.1)
Sodium: 137 mmol/L (ref 136–145)
Total Protein: 6.6 g/dL (ref 6.4–8.3)

## 2017-09-15 LAB — CBC WITH DIFFERENTIAL (CANCER CENTER ONLY)
Basophils Absolute: 0.1 10*3/uL (ref 0.0–0.1)
Basophils Relative: 0 %
EOS PCT: 0 %
Eosinophils Absolute: 0 10*3/uL (ref 0.0–0.5)
HCT: 25.7 % — ABNORMAL LOW (ref 34.8–46.6)
Hemoglobin: 8.6 g/dL — ABNORMAL LOW (ref 11.6–15.9)
LYMPHS ABS: 1.4 10*3/uL (ref 0.9–3.3)
LYMPHS PCT: 9 %
MCH: 30.9 pg (ref 25.1–34.0)
MCHC: 33.2 g/dL (ref 31.5–36.0)
MCV: 92.8 fL (ref 79.5–101.0)
MONO ABS: 0.5 10*3/uL (ref 0.1–0.9)
MONOS PCT: 3 %
Neutro Abs: 14.8 10*3/uL — ABNORMAL HIGH (ref 1.5–6.5)
Neutrophils Relative %: 88 %
PLATELETS: 131 10*3/uL — AB (ref 145–400)
RBC: 2.77 MIL/uL — ABNORMAL LOW (ref 3.70–5.45)
RDW: 18.9 % — AB (ref 11.2–14.5)
WBC Count: 16.8 10*3/uL — ABNORMAL HIGH (ref 3.9–10.3)

## 2017-09-15 MED ORDER — SODIUM CHLORIDE 0.9% FLUSH
10.0000 mL | Freq: Once | INTRAVENOUS | Status: AC
  Administered 2017-09-15: 10 mL
  Filled 2017-09-15: qty 10

## 2017-09-15 MED ORDER — HEPARIN SOD (PORK) LOCK FLUSH 100 UNIT/ML IV SOLN
250.0000 [IU] | Freq: Once | INTRAVENOUS | Status: AC
  Administered 2017-09-15: 250 [IU]
  Filled 2017-09-15: qty 5

## 2017-09-15 NOTE — Progress Notes (Signed)
These preliminary result these preliminary results were noted.  Awaiting final report.

## 2017-09-15 NOTE — Telephone Encounter (Signed)
Wants someone to go over scan with her. Per  Julien Nordmann he will go over scan at appt.

## 2017-09-15 NOTE — Telephone Encounter (Signed)
Pt aware . I also reviewed today's lab with pt and daughter.

## 2017-09-16 NOTE — Progress Notes (Signed)
These preliminary result these preliminary results were noted.  Awaiting final report.

## 2017-09-17 ENCOUNTER — Telehealth: Payer: Self-pay | Admitting: Medical Oncology

## 2017-09-17 LAB — CULTURE, BLOOD (SINGLE)
CULTURE: NO GROWTH
Culture: NO GROWTH
SPECIAL REQUESTS: ADEQUATE
SPECIAL REQUESTS: ADEQUATE

## 2017-09-17 NOTE — Telephone Encounter (Addendum)
[name] said Galen "woke up this am and when she stands up her legs want to go out on her". She is not eating much. She is eating cheerios. She does have dry heaves. She is dizzy all the time and confused at time- not able to name people in pictures.

## 2017-09-17 NOTE — Telephone Encounter (Signed)
Katherine Cole can see her and consider her for IV fluid.

## 2017-09-17 NOTE — Telephone Encounter (Signed)
[name] reports pt has extreme  leg weakness . I left message to call back.

## 2017-09-17 NOTE — Progress Notes (Signed)
These preliminary result these preliminary results were noted.  Awaiting final report.

## 2017-09-17 NOTE — Telephone Encounter (Signed)
[name] notified and schedule request sent.

## 2017-09-18 ENCOUNTER — Inpatient Hospital Stay: Payer: Medicare Other

## 2017-09-18 ENCOUNTER — Telehealth: Payer: Self-pay | Admitting: *Deleted

## 2017-09-18 ENCOUNTER — Inpatient Hospital Stay (HOSPITAL_BASED_OUTPATIENT_CLINIC_OR_DEPARTMENT_OTHER): Payer: Medicare Other | Admitting: Medical

## 2017-09-18 VITALS — BP 147/55 | HR 72 | Temp 98.4°F | Resp 18 | Ht 61.0 in | Wt 81.3 lb

## 2017-09-18 DIAGNOSIS — C778 Secondary and unspecified malignant neoplasm of lymph nodes of multiple regions: Secondary | ICD-10-CM | POA: Diagnosis not present

## 2017-09-18 DIAGNOSIS — C3492 Malignant neoplasm of unspecified part of left bronchus or lung: Secondary | ICD-10-CM

## 2017-09-18 DIAGNOSIS — Z95828 Presence of other vascular implants and grafts: Secondary | ICD-10-CM

## 2017-09-18 DIAGNOSIS — C787 Secondary malignant neoplasm of liver and intrahepatic bile duct: Secondary | ICD-10-CM | POA: Diagnosis not present

## 2017-09-18 DIAGNOSIS — R0989 Other specified symptoms and signs involving the circulatory and respiratory systems: Secondary | ICD-10-CM

## 2017-09-18 DIAGNOSIS — C7951 Secondary malignant neoplasm of bone: Secondary | ICD-10-CM | POA: Diagnosis not present

## 2017-09-18 DIAGNOSIS — C3412 Malignant neoplasm of upper lobe, left bronchus or lung: Secondary | ICD-10-CM | POA: Diagnosis not present

## 2017-09-18 DIAGNOSIS — R531 Weakness: Secondary | ICD-10-CM

## 2017-09-18 DIAGNOSIS — D72823 Leukemoid reaction: Secondary | ICD-10-CM | POA: Diagnosis not present

## 2017-09-18 DIAGNOSIS — R42 Dizziness and giddiness: Secondary | ICD-10-CM | POA: Diagnosis not present

## 2017-09-18 DIAGNOSIS — Z5111 Encounter for antineoplastic chemotherapy: Secondary | ICD-10-CM | POA: Diagnosis not present

## 2017-09-18 LAB — CMP (CANCER CENTER ONLY)
ALBUMIN: 3.5 g/dL (ref 3.5–5.0)
ALT: 13 U/L (ref 0–55)
ANION GAP: 7 (ref 3–11)
AST: 12 U/L (ref 5–34)
Alkaline Phosphatase: 160 U/L — ABNORMAL HIGH (ref 40–150)
BILIRUBIN TOTAL: 0.3 mg/dL (ref 0.2–1.2)
BUN: 22 mg/dL (ref 7–26)
CHLORIDE: 100 mmol/L (ref 98–109)
CO2: 29 mmol/L (ref 22–29)
Calcium: 9.3 mg/dL (ref 8.4–10.4)
Creatinine: 0.73 mg/dL (ref 0.60–1.10)
GFR, Est AFR Am: >60 mL/min (ref >60)
Glucose, Bld: 160 mg/dL — ABNORMAL HIGH (ref 70–140)
POTASSIUM: 4.5 mmol/L (ref 3.5–5.1)
Sodium: 136 mmol/L (ref 136–145)
TOTAL PROTEIN: 6.8 g/dL (ref 6.4–8.3)

## 2017-09-18 LAB — CBC WITH DIFFERENTIAL (CANCER CENTER ONLY)
BASOS ABS: 0.3 10*3/uL — AB (ref 0.0–0.1)
Basophils Relative: 4 %
Eosinophils Absolute: 0 10*3/uL (ref 0.0–0.5)
Eosinophils Relative: 0 %
HEMATOCRIT: 27.1 % — AB (ref 34.8–46.6)
Hemoglobin: 8.9 g/dL — ABNORMAL LOW (ref 11.6–15.9)
LYMPHS ABS: 2.2 10*3/uL (ref 0.9–3.3)
LYMPHS PCT: 27 %
MCH: 30.6 pg (ref 25.1–34.0)
MCHC: 32.9 g/dL (ref 31.5–36.0)
MCV: 93 fL (ref 79.5–101.0)
Monocytes Absolute: 0.9 10*3/uL (ref 0.1–0.9)
Monocytes Relative: 11 %
NEUTROS ABS: 4.7 10*3/uL (ref 1.5–6.5)
Neutrophils Relative %: 58 %
Platelet Count: 95 10*3/uL — ABNORMAL LOW (ref 145–400)
RBC: 2.91 MIL/uL — AB (ref 3.70–5.45)
RDW: 18.5 % — ABNORMAL HIGH (ref 11.2–14.5)
WBC: 8.1 10*3/uL (ref 3.9–10.3)

## 2017-09-18 MED ORDER — HEPARIN SOD (PORK) LOCK FLUSH 100 UNIT/ML IV SOLN
500.0000 [IU] | Freq: Once | INTRAVENOUS | Status: AC
  Administered 2017-09-18: 500 [IU]
  Filled 2017-09-18: qty 5

## 2017-09-18 MED ORDER — ALBUTEROL SULFATE (2.5 MG/3ML) 0.083% IN NEBU
INHALATION_SOLUTION | RESPIRATORY_TRACT | Status: AC
Start: 2017-09-18 — End: ?
  Filled 2017-09-18: qty 3

## 2017-09-18 MED ORDER — ALBUTEROL SULFATE (2.5 MG/3ML) 0.083% IN NEBU
2.5000 mg | INHALATION_SOLUTION | Freq: Once | RESPIRATORY_TRACT | Status: AC
  Administered 2017-09-18: 2.5 mg via RESPIRATORY_TRACT
  Filled 2017-09-18: qty 3

## 2017-09-18 MED ORDER — SODIUM CHLORIDE 0.9 % IV SOLN
INTRAVENOUS | Status: AC
  Administered 2017-09-18: 12:00:00 via INTRAVENOUS

## 2017-09-18 MED ORDER — LEVOFLOXACIN 250 MG PO TABS: 250.0000 mg | ORAL_TABLET | Freq: Every day | ORAL | 0 refills | Status: DC

## 2017-09-18 MED ORDER — SODIUM CHLORIDE 0.9% FLUSH
10.0000 mL | Freq: Once | INTRAVENOUS | Status: AC
  Administered 2017-09-18: 10 mL
  Filled 2017-09-18: qty 10

## 2017-09-18 NOTE — Telephone Encounter (Signed)
Daughter called, requesting an appt for [facility]. Message to scheduling.

## 2017-09-18 NOTE — Patient Instructions (Signed)
Dehydration, Adult Dehydration is when there is not enough fluid or water in your body. This happens when you lose more fluids than you take in. Dehydration can range from mild to very bad. It should be treated right away to keep it from getting very bad. Symptoms of mild dehydration may include:  Thirst.  Dry lips.  Slightly dry mouth.  Dry, warm skin.  Dizziness. Symptoms of moderate dehydration may include:  Very dry mouth.  Muscle cramps.  Dark pee (urine). Pee may be the color of tea.  Your body making less pee.  Your eyes making fewer tears.  Heartbeat that is uneven or faster than normal (palpitations).  Headache.  Light-headedness, especially when you stand up from sitting.  Fainting (syncope). Symptoms of very bad dehydration may include:  Changes in skin, such as: ? Cold and clammy skin. ? Blotchy (mottled) or pale skin. ? Skin that does not quickly return to normal after being lightly pinched and let go (poor skin turgor).  Changes in body fluids, such as: ? Feeling very thirsty. ? Your eyes making fewer tears. ? Not sweating when body temperature is high, such as in hot weather. ? Your body making very little pee.  Changes in vital signs, such as: ? Weak pulse. ? Pulse that is more than 100 beats a minute when you are sitting still. ? Fast breathing. ? Low blood pressure.  Other changes, such as: ? Sunken eyes. ? Cold hands and feet. ? Confusion. ? Lack of energy (lethargy). ? Trouble waking up from sleep. ? Short-term weight loss. ? Unconsciousness. Follow these instructions at home:  If told by your doctor, drink an ORS: ? Make an ORS by using instructions on the package. ? Start by drinking small amounts, about  cup (120 mL) every 5-10 minutes. ? Slowly drink more until you have had the amount that your doctor said to have.  Drink enough clear fluid to keep your pee clear or pale yellow. If you were told to drink an ORS, finish the ORS  first, then start slowly drinking clear fluids. Drink fluids such as: ? Water. Do not drink only water by itself. Doing that can make the salt (sodium) level in your body get too low (hyponatremia). ? Ice chips. ? Fruit juice that you have added water to (diluted). ? Low-calorie sports drinks.  Avoid: ? Alcohol. ? Drinks that have a lot of sugar. These include high-calorie sports drinks, fruit juice that does not have water added, and soda. ? Caffeine. ? Foods that are greasy or have a lot of fat or sugar.  Take over-the-counter and prescription medicines only as told by your doctor.  Do not take salt tablets. Doing that can make the salt level in your body get too high (hypernatremia).  Eat foods that have minerals (electrolytes). Examples include bananas, oranges, potatoes, tomatoes, and spinach.  Keep all follow-up visits as told by your doctor. This is important. Contact a doctor if:  You have belly (abdominal) pain that: ? Gets worse. ? Stays in one area (localizes).  You have a rash.  You have a stiff neck.  You get angry or annoyed more easily than normal (irritability).  You are more sleepy than normal.  You have a harder time waking up than normal.  You feel: ? Weak. ? Dizzy. ? Very thirsty.  You have peed (urinated) only a small amount of very dark pee during 6-8 hours. Get help right away if:  You have symptoms of   very bad dehydration.  You cannot drink fluids without throwing up (vomiting).  Your symptoms get worse with treatment.  You have a fever.  You have a very bad headache.  You are throwing up or having watery poop (diarrhea) and it: ? Gets worse. ? Does not go away.  You have blood or something green (bile) in your throw-up.  You have blood in your poop (stool). This may cause poop to look black and tarry.  You have not peed in 6-8 hours.  You pass out (faint).  Your heart rate when you are sitting still is more than 100 beats a  minute.  You have trouble breathing. This information is not intended to replace advice given to you by your health care provider. Make sure you discuss any questions you have with your health care provider. Document Released: 01/19/2009 Document Revised: 10/13/2015 Document Reviewed: 05/19/2015 Elsevier Interactive Patient Education  2018 [employer]  

## 2017-09-19 ENCOUNTER — Telehealth: Payer: Self-pay | Admitting: Oncology

## 2017-09-19 NOTE — Telephone Encounter (Signed)
Tried to reach patient regarding change from 7/15 to 7/16. I did advise to ask for an updated calendar at next appointment

## 2017-09-19 NOTE — Progress Notes (Signed)
[facility] Progress Note   Katherine Cole [id number] Jul 24, 1940 77 y.o.  Katherine Cole is managed by Katherine Cole. 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  The results of these returned stable.  The patient's labs did not indicate that she was dehydrated however given her symptomatology it was decided that the patient would receive 1 L of normal saline IV.  Bilateral rails: The patient was given an albuterol nebulizer.  She continues this at home 3 times daily but has not yet done so today.  A CBC was done today with the patient's WBC returning within normal limits at 8.1 with an [place] returning within normal limits at 4.7.  Based on the patient's physical exam it was decided that the patient would be given a prescription for Levaquin 250 mg p.o. once daily x7 days  Extensive stage small cell lung cancer: The patient is status post cycle 3, day 3 of carboplatin and etoposide dosed on 09/10/2017. She will return to see a member of Katherine Cole team on 09/29/2017.  Please see After Visit Summary for patient specific instructions.  Future Appointments  Date Time Provider [place]  09/25/2017 10:30 AM [facility] 1 [facility] [place]  09/29/2017 12:00 PM [facility] LAB 3 [facility] None  09/29/2017 12:15 PM [facility] FLUSH NURSE [facility] None  09/29/2017   1:00 PM Katherine Cole [facility] None  09/29/2017  2:00 PM [facility] I26 DNS [facility] None  09/30/2017  3:00 PM [facility] F20 [facility] None  09/30/2017  3:15 PM Katherine Cole [facility] None  10/01/2017  3:00 PM [facility] C9 [facility] None  10/20/2017 12:15 PM [facility] LAB 3 [facility] None  10/20/2017 12:30 PM [facility] [place] FLUSH [facility] None  10/20/2017  1:00 PM Katherine Cole [facility] None  10/20/2017  2:00 PM [facility] I25 DNS [facility] None  10/21/2017  1:45 PM [facility] INFUSION [facility] None  10/22/2017  1:30 PM [facility] INFUSION [facility] None  11/10/2017  9:30 AM [facility] LAB 4 [facility] None  11/10/2017  9:45 AM [facility] [place] FLUSH [facility] None  11/10/2017 10:00 AM Katherine Cole [facility] None  11/10/2017 11:00 AM [facility] INFUSION [facility] None  11/11/2017  1:45 PM [facility] INFUSION [facility] None  11/12/2017  2:00 PM [facility] INFUSION [facility] None    No orders of the defined types were placed in this encounter.      Subjective:   Patient ID:  [name] is a 77 y.o. (DOB 1940-04-24) female.  Chief Complaint:  Chief Complaint  Patient presents with  . Fatigue    HPI [name] is a 77 year old female with an extensive stage (T2 a, N2, M1 C) small cell lung cancer. She is managed by Dr. Fanny Cole. Katherine Cole and has most recently been treated with cycle 3, day 3 of Carboplatin and etoposide on 09/10/2017.  She was last seen by this clinician on 09/12/2017  when she was noted to have a WBC of 77.2 and an ANC of 73.3.  Based on this and based on the fact that the patient states that she was feeling poorly she was transported to the emergency room for evaluation and management.  She was given cefepime 2 g IV x1 prior to transporting her to the emergency room.  She had a work-up completed in the emergency room.  This work-up did not show any evidence of sepsis.  The patient was discharged home later that  evening.  She presents to the office today with weakness and dizziness.  She has had no nausea since Saturday.  She had one episode of vomiting Saturday.  She denies pain, diarrhea, constipation, fevers, chills, or sweats.  Medications: I have reviewed the patient's current medications.  Allergies:  Allergies  Allergen Reactions  . Codeine Other (See Comments)    "gives me a migraine" (03/11/2012)  . Morphine And Related Other (See Comments)    "gives me a migraine" (03/11/2012)    Past Medical History:  Diagnosis Date  . Arthritis    "in my fingers"   . CAD (coronary artery disease)    a. MI 1999 with 2 stents at that time followed by stenting 3-4 yrs later. b. s/p DES to LAD guided by pressure wire 03/11/12.  . Colon polyps    adenomatous  . Depression   . Diabetes mellitus, type 2 ([place])   . Hyperlipemia   . Hypertension   . Osteoarthritis   . Pneumonia 06/2017  . Skin cancer 2012   "nose; right middle finger"  . Stroke [facility])     Past Surgical History:  Procedure Laterality Date  . APPENDECTOMY  ~ 1965  . CATARACT EXTRACTION    . CORONARY ANGIOPLASTY WITH STENT PLACEMENT  1999; 2004; 03/11/2012   "2 + 1 + 1; total of 4" (03/11/2012)  . FEMUR FRACTURE SURGERY  2011   RLE (03/11/2012)  . IR FLUORO GUIDE PORT INSERTION RIGHT  07/18/2017  . IR US GUIDE VASC ACCESS RIGHT  07/18/2017  . PERCUTANEOUS CORONARY STENT INTERVENTION (PCI-S) N/A 03/11/2012   Procedure: PERCUTANEOUS CORONARY STENT INTERVENTION (PCI-S);  Surgeon: Sherren Mocha, MD;  Location: [facility] C[facility] LAB;  Service: Cardiovascular;  Laterality: N/A;  . SKIN CANCER EXCISION  2013   "nose & right middle finger; actinic keratosis" (03/11/2012)  . TONSILLECTOMY AND ADENOIDECTOMY  ~ 1962  . TUBAL LIGATION  1977  . VIDEO BRONCHOSCOPY WITH ENDOBRONCHIAL ULTRASOUND N/A 07/04/2017   Procedure: VIDEO BRONCHOSCOPY WITH ENDOBRONCHIAL ULTRASOUND;  Surgeon: Collene Gobble, MD;  Location: [facility] OR;  Service: Thoracic;  Laterality: N/A;     Family History  Problem Relation Age of Onset  . Liver cancer Mother   . Brain cancer Father   . Stomach cancer Father   . Colon cancer Brother 30  . Lung cancer Sister   . Lung cancer Brother   . Stomach cancer Brother   . Diabetes Daughter     Social History   Socioeconomic History  . Marital status: Married    Spouse name: Not on file  . Number of children: 3  . Years of education: Not on file  . Highest education level: Not on file  Occupational History  . Occupation: Retired  Scientific laboratory technician  . Financial resource strain: Not on file  . Food insecurity:    Worry: Not on file    Inability: Not on file  . Transportation needs:    Medical: Not on  file    Non-medical: Not on file  Tobacco Use  . Smoking status: Current Every Day Smoker    Packs/day: 0.50    Years: 51.00    Pack years: 25.50    Types: Cigarettes  . Smokeless tobacco: Never Used  Substance and Sexual Activity  . Alcohol use: No  . Drug use: No  . Sexual activity: Never  Lifestyle  . Physical activity:    Days per week: Not on file    Minutes per session: Not on file  . Stress: Not on file  Relationships  . Social connections:    Talks on phone: Not on file    Gets together: Not on file    Attends religious service: Not on file    Active member of club or organization: Not on file    Attends meetings of clubs or organizations: Not on file    Relationship status: Not on file  . Intimate partner violence:    Fear of current or ex partner: Not on file    Emotionally abused: Not on file    Physically abused: Not on file    Forced sexual activity: Not on file  Other Topics Concern  . Not on file  Social History Narrative   Grandson lives with her.      Past Medical History, Surgical history, Social history, and Family history were reviewed and updated as appropriate.   Please see review of systems for further details on the patient's review from today.   Review of Systems:  Review of  Systems  Constitutional: Negative for diaphoresis.  HENT: Negative for trouble swallowing.   Respiratory: Negative for cough, choking, chest tightness, shortness of breath and wheezing.   Cardiovascular: Negative for chest pain and palpitations.  Gastrointestinal: Negative for nausea and vomiting.  Musculoskeletal: Negative for back pain.  Skin: Negative for color change and rash.  Neurological: Positive for dizziness and weakness. Negative for light-headedness and headaches.    Objective:   Physical Exam:  BP (!) 147/55 (BP Location: Left Arm, Patient Position: Sitting) Comment: Notified Nurse of Bp  Pulse 72   Temp 98.4 F (36.9 C) (Oral)   Resp 18   Ht 5\' 1"  (1.549 m)   Wt 81 lb 4.8 oz (36.9 kg)   SpO2 99%   BMI 15.36 kg/m   ECOG: 1  Physical Exam  Constitutional:  The patient is a elderly female who appears to be chronically ill but no acute distress.  HENT:  Head: Normocephalic and atraumatic.  Cardiovascular: Normal rate, regular rhythm and normal heart sounds. Exam reveals no gallop and no friction rub.  No murmur heard. Pulmonary/Chest: Effort normal. No stridor. No respiratory distress. She has no wheezes. She has rales (Rales throughout all lung fields.).  Abdominal: Soft. Bowel sounds are normal. She exhibits no distension and no mass. There is no tenderness. There is no rebound and no guarding.  Neurological: She is alert.  Skin: Skin is warm and dry. No erythema. No pallor.  Psychiatric: She has a normal mood and affect. Her behavior is normal. Judgment and thought content normal.    Lab Review:     Component Value Date/Time   NA 136 09/18/2017 1009   Cole 4.5 09/18/2017 1009   CL 100 09/18/2017 1009   CO2 29 09/18/2017 1009   GLUCOSE 160 (H) 09/18/2017 1009   BUN 22 09/18/2017 1009   CREATININE 0.73 09/18/2017 1009   CALCIUM 9.3 09/18/2017 1009   PROT 6.8 09/18/2017 1009  ALBUMIN 3.5 09/18/2017 1009   AST 12 09/18/2017 1009   ALT 13 09/18/2017 1009    ALKPHOS 160 (H) 09/18/2017 1009   BILITOT 0.3 09/18/2017 1009   GFRNONAA >60 09/18/2017 1009   GFRAA >60 09/18/2017 1009       Component Value Date/Time   WBC 8.1 09/18/2017 1009   WBC 76.2 (HH) 09/12/2017 1744   RBC 2.91 (L) 09/18/2017 1009   HGB 8.9 (L) 09/18/2017 1009   HCT 27.1 (L) 09/18/2017 1009   HCT 27.3 (L) 08/04/2017 1704   PLT 95 (L) 09/18/2017 1009   MCV 93.0 09/18/2017 1009   MCH 30.6 09/18/2017 1009   MCHC 32.9 09/18/2017 1009   RDW 18.5 (H) 09/18/2017 1009   LYMPHSABS 2.2 09/18/2017 1009   MONOABS 0.9 09/18/2017 1009   EOSABS 0.0 09/18/2017 1009   BASOSABS 0.3 (H) 09/18/2017 1009   -------------------------------  Imaging from last 24 hours (if applicable):  Radiology interpretation: Dg Chest 2 View  Result Date: 08/26/2017 CLINICAL DATA:  Cancer patient on chemotherapy with fever and lethargy. EXAM: CHEST - 2 VIEW COMPARISON:  08/14/2017 FINDINGS: Heart size is normal. There is moderate aortic atherosclerosis without aneurysmal dilatation. Port catheter tip terminates in the mid to distal SVC. No pulmonary consolidation, effusion or pneumothorax. Minimal atelectasis and/or scarring at the lung bases. Soft tissue calcifications adjacent to the humeral heads bilaterally compatible with calcific rotator cuff tendinopathy. IMPRESSION: Stable appearance of the chest with bibasilar atelectasis. No active pulmonary disease. Aortic atherosclerosis. Electronically Signed   By: Ashley Royalty M.D.   On: 08/26/2017 23:46   Dg Chest Portable 1 View  Result Date: 09/12/2017 CLINICAL DATA:  Fatigue, weakness, dizziness and nausea for the past 2 days. Stage IV small cell lung cancer. Marked leukocytosis. EXAM: PORTABLE CHEST 1 VIEW COMPARISON:  08/26/2017.  Chest CT dated 08/08/2017. FINDINGS: Interval irregular appearance of the left hilum. Increased pleural based density in the left lateral upper chest. Clear right lung. The lungs remain hyperexpanded with mildly prominent  interstitial markings. Diffuse osteopenia. Coronary artery stent. IMPRESSION: 1. Interval increase in size of the patient's known malignancy in the lateral aspect of the left upper lobe. 2. Interval irregular appearance of the left hilum. This could be due to an interval mass or superimposed densities. 3. Stable changes of COPD. Electronically Signed   By: Claudie Revering M.D.   On: 09/12/2017 18:22

## 2017-09-25 ENCOUNTER — Ambulatory Visit (HOSPITAL_COMMUNITY)
Admission: RE | Admit: 2017-09-25 | Discharge: 2017-09-25 | Disposition: A | Discharge status: Home / Self Care | Payer: Medicare Other | Source: Ambulatory Visit | Attending: Nurse Practitioner | Admitting: Nurse Practitioner

## 2017-09-25 ENCOUNTER — Encounter (HOSPITAL_COMMUNITY): Payer: Self-pay

## 2017-09-25 DIAGNOSIS — R590 Localized enlarged lymph nodes: Secondary | ICD-10-CM | POA: Insufficient documentation

## 2017-09-25 DIAGNOSIS — C3490 Malignant neoplasm of unspecified part of unspecified bronchus or lung: Secondary | ICD-10-CM | POA: Diagnosis not present

## 2017-09-25 DIAGNOSIS — C787 Secondary malignant neoplasm of liver and intrahepatic bile duct: Secondary | ICD-10-CM | POA: Diagnosis not present

## 2017-09-25 DIAGNOSIS — I77811 Abdominal aortic ectasia: Secondary | ICD-10-CM | POA: Diagnosis not present

## 2017-09-25 DIAGNOSIS — I700 Atherosclerosis of aorta: Secondary | ICD-10-CM | POA: Diagnosis not present

## 2017-09-25 DIAGNOSIS — C7951 Secondary malignant neoplasm of bone: Secondary | ICD-10-CM | POA: Diagnosis not present

## 2017-09-25 DIAGNOSIS — I2510 Atherosclerotic heart disease of native coronary artery without angina pectoris: Secondary | ICD-10-CM | POA: Diagnosis not present

## 2017-09-25 DIAGNOSIS — C3492 Malignant neoplasm of unspecified part of left bronchus or lung: Secondary | ICD-10-CM | POA: Diagnosis not present

## 2017-09-25 MED ORDER — IOPAMIDOL (ISOVUE-370) INJECTION 76%
INTRAVENOUS | Status: AC
  Filled 2017-09-25: qty 100

## 2017-09-25 MED ORDER — IOPAMIDOL (ISOVUE-300) INJECTION 61%
80.0000 mL | Freq: Once | INTRAVENOUS | Status: AC | PRN
  Administered 2017-09-25: 80 mL via INTRAVENOUS

## 2017-09-26 NOTE — Assessment & Plan Note (Addendum)
This is a very pleasant 77 year old white female recently diagnosed with extensive stage small cell lung cancer and currently undergoing systemic chemotherapy with carboplatin and etoposide.  Carboplatin was dose reduced to an AUC of 4 and etoposide was dose reduced to 80 mg/m beginning with cycle #3.  She tolerated the dose reduction much better.   She had a recent restaging CT scan of the chest, abdomen, pelvis.  The patient was seen with Dr. Julien Nordmann.  CT scan results were discussed with the patient and her daughter which showed a response to therapy.  Recommend that she continue her current therapy for a total of 6 cycles.  She will proceed with cycle 4 today as scheduled. The patient will follow-up weekly for labs and will be seen back for follow-up visit in 3 weeks for evaluation prior to cycle 5 of her treatment.  For the malnutrition and weight loss she is followed by the dietitian at the cancer center.  The patient was advised to call immediately if she has any concerning symptoms in the interval. The patient voices understanding of current disease status and treatment options and is in agreement with the current care plan.  All questions were answered. The patient knows to call the clinic with any problems, questions or concerns. We can certainly see the patient much sooner if necessary.

## 2017-09-26 NOTE — Progress Notes (Signed)
K[facility] Cole OFFICE PROGRESS NOTE  Prince Solian, MD [place] [address]  DIAGNOSIS: Extensive stage(T2a, N2, M1c)small cell lung cancer presented withsubpleural mass within the left upper lobewithassociated ipsilateral hilar and mediastinal nodal metastasis, liver metastasis and bone metastasis  PRIOR THERAPY: None  CURRENT THERAPY: Systemic chemotherapy with carboplatin for AUC of 5 on day 1 and etoposide 100 mg/M2 on days 1, 2 and 3 with Neulasta support every 3 weeks.  Carboplatin was dose reduced to an AUC of 4 and etoposide was dose reduced to 80 mg/m beginning with cycle #3. First cycle started on 07/22/2017.  Status post 3 cycles.  INTERVAL HISTORY: Katherine Cole 77 y.o. female returns for routine follow-up visit accompanied by her daughter.  The patient reports that she is feeling better this past week.  She had a lot of fatigue, nausea, and arthralgias related to her Neulasta injection.  She was dehydrated and received IV fluids and her symptom management clinic.  She is eating and drinking better.  She is taking in Ensure high-protein 3 times a day.  Her weight remains stable.  Denies fevers and chills.  Denies chest pain, shortness of breath, cough, hemoptysis.  Denies nausea, vomiting, constipation, diarrhea today.  The patient is here for evaluation prior to cycle 4 of her treatment and to review her restaging CT scan results.  MEDICAL HISTORY: Past Medical History:  Diagnosis Date  . Arthritis    "in my fingers"   . CAD (coronary artery disease)    a. MI 1999 with 2 stents at that time followed by stenting 3-4 yrs later. b. s/p DES to LAD guided by pressure wire 03/11/12.  . Colon polyps    adenomatous  . Depression   . Diabetes mellitus, type 2 ([place])   . Hyperlipemia   . Hypertension   . Osteoarthritis   . Pneumonia 06/2017  . Skin cancer 2012   "nose; right middle finger"  . Stroke [facility])     ALLERGIES:  is allergic to codeine  and morphine and related.  MEDICATIONS:  Current Outpatient Medications  Medication Sig Dispense Refill  . ALPRAZolam (XANAX) 0.25 MG tablet Take 0.25 mg by mouth at bedtime as needed for anxiety.    Marland Kitchen amLODipine (NORVASC) 10 MG tablet Take 1 tablet (10 mg total) by mouth daily. 30 tablet 0  . aspirin 81 MG tablet Take 1 tablet (81 mg total) by mouth daily. (Patient taking differently: Take 81 mg by mouth as directed. On mondays and Thursdays) 30 tablet 1  . atorvastatin (LIPITOR) 80 MG tablet Take 80 mg by mouth daily.    . clopidogrel (PLAVIX) 75 MG tablet Take 75 mg by mouth daily.    . fluticasone (FLONASE) 50 MCG/ACT nasal spray Place 1 spray into both nostrils daily. 16 g 2  . guaiFENesin (MUCINEX) 600 MG 12 hr tablet Take 2 tablets (1,200 mg total) by mouth 2 (two) times daily as needed (Use for one week post discharge, then twice daily as needed). (Patient taking differently: Take 1,200 mg by mouth 2 (two) times daily as needed for cough (Use for one week post discharge, then twice daily as needed). )    . HUMALOG KWIKPEN 100 UNIT/ML KiwkPen 0-8 units three times daily after meals  2  . ipratropium-albuterol (DUONEB) 0.5-2.5 (3) MG/3ML SOLN Take 3 mLs by nebulization every 6 (six) hours as needed (Shortness of breath or wheezing). 360 mL 3  . lactose free nutrition (BOOST PLUS) LIQD Take  237 mLs by mouth 3 (three) times daily between meals. 14 Can 0  . loratadine (CLARITIN) 10 MG tablet Take 10 mg by mouth daily.    . metFORMIN (GLUCOPHAGE) 1000 MG tablet Take 1,000 mg by mouth daily.     . metoprolol succinate (TOPROL-XL) 50 MG 24 hr tablet Take 50 mg by mouth daily. Take with or immediately following a meal.    . mirtazapine (REMERON) 15 MG tablet Take 15 mg by mouth at bedtime.    . ondansetron (ZOFRAN ODT) 4 MG disintegrating tablet Take 1 tablet (4 mg total) by mouth every 8 (eight) hours as needed for nausea or vomiting. 20 tablet 3  . prochlorperazine (COMPAZINE) 10 MG tablet Take  1 tablet (10 mg total) by mouth every 6 (six) hours as needed for nausea or vomiting. 30 tablet 0  . sitaGLIPtin (JANUVIA) 100 MG tablet Take 0.5 tablets (50 mg total) by mouth daily. 14 tablet 0  . sodium chloride (OCEAN) 0.65 % SOLN nasal spray Place 1 spray into both nostrils as needed for congestion (use twice daily for one week post discharge, then as needed).  0  . nitroGLYCERIN (NITROSTAT) 0.4 MG SL tablet Place 1 tablet (0.4 mg total) under the tongue every 5 (five) minutes as needed (up to 3 doses). For chest pain (Patient not taking: Reported on 09/29/2017)    . promethazine (PHENERGAN) 12.5 MG tablet Take 1 tablet (12.5 mg total) by mouth every 4 (four) hours as needed for nausea or vomiting. (Patient not taking: Reported on 09/29/2017) 20 tablet 0   No current facility-administered medications for this visit.    Facility-Administered Medications Ordered in Other Visits  Medication Dose Route Frequency Provider Last Rate Last Dose  . CARBOplatin (PARAPLATIN) 220 mg in sodium chloride 0.9 % 250 mL chemo infusion  220 mg Intravenous Once Curt Bears, MD      . etoposide (VEPESID) 100 mg in sodium chloride 0.9 % 250 mL chemo infusion  80 mg/m2 (Treatment Plan Recorded) Intravenous Once Curt Bears, MD      . heparin lock flush 100 unit/mL  500 Units Intracatheter Once PRN Curt Bears, MD      . sodium chloride flush (NS) 0.9 % injection 10 mL  10 mL Intracatheter PRN Curt Bears, MD        SURGICAL HISTORY:  Past Surgical History:  Procedure Laterality Date  . APPENDECTOMY  ~ 1965  . CATARACT EXTRACTION    . CORONARY ANGIOPLASTY WITH STENT PLACEMENT  1999; 2004; 03/11/2012   "2 + 1 + 1; total of 4" (03/11/2012)  . FEMUR FRACTURE SURGERY  2011   RLE (03/11/2012)  . IR FLUORO GUIDE PORT INSERTION RIGHT  07/18/2017  . IR US GUIDE VASC ACCESS RIGHT  07/18/2017  . PERCUTANEOUS CORONARY STENT INTERVENTION (PCI-S) N/A 03/11/2012   Procedure: PERCUTANEOUS CORONARY STENT  INTERVENTION (PCI-S);  Surgeon: Sherren Mocha, MD;  Location: [facility] CATH LAB;  Service: Cardiovascular;  Laterality: N/A;  . SKIN CANCER EXCISION  2013   "nose & right middle finger; actinic keratosis" (03/11/2012)  . TONSILLECTOMY AND ADENOIDECTOMY  ~ 1962  . TUBAL LIGATION  1977  . VIDEO BRONCHOSCOPY WITH ENDOBRONCHIAL ULTRASOUND N/A 07/04/2017   Procedure: VIDEO BRONCHOSCOPY WITH ENDOBRONCHIAL ULTRASOUND;  Surgeon: Collene Gobble, MD;  Location: [facility] OR;  Service: Thoracic;  Laterality: N/A;    REVIEW OF SYSTEMS:   Review of Systems  Constitutional: Negative for appetite change, chills, fever and unexpected weight change. Positive for fatigue. HENT:  Negative for mouth sores, nosebleeds, sore throat and trouble swallowing.   Eyes: Negative for eye problems and icterus.  Respiratory: Negative for cough, hemoptysis, shortness of breath and wheezing.   Cardiovascular: Negative for chest pain and leg swelling.  Gastrointestinal: Negative for abdominal pain, constipation, diarrhea, and vomiting. She had nausea following her last cycle of chemotherapy but none at this time. Genitourinary: Negative for bladder incontinence, difficulty urinating, dysuria, frequency and hematuria.   Musculoskeletal: Negative for back pain, gait problem, neck pain and neck stiffness. She had arthralgias related to her Neulasta injection now resolved. Skin: Negative for itching and rash.  Neurological: Negative for dizziness, extremity weakness, gait problem, headaches, light-headedness and seizures.  Hematological: Negative for adenopathy. Does not bruise/bleed easily.  Psychiatric/Behavioral: Negative for confusion, depression and sleep disturbance. The patient is not nervous/anxious.     PHYSICAL EXAMINATION:  Blood pressure (!) 144/67, pulse 72, temperature 99 F (37.2 C), temperature source Oral, resp. rate 17, height 5\' 1"  (1.549 m), weight 80 lb 3.2 oz (36.4 kg), SpO2 99 %.  ECOG PERFORMANCE STATUS: 1 -  Symptomatic but completely ambulatory  Physical Exam  Constitutional: Oriented to person, place, and time. No distress.  HENT:  Head: Normocephalic and atraumatic.  Mouth/Throat: Oropharynx is clear and moist. No oropharyngeal exudate.  Eyes: Conjunctivae are normal. Right eye exhibits no discharge. Left eye exhibits no discharge. No scleral icterus.  Neck: Normal range of motion. Neck supple.  Cardiovascular: Normal rate, regular rhythm, normal heart sounds and intact distal pulses.   Pulmonary/Chest: Effort normal and breath sounds normal. No respiratory distress. No wheezes. No rales.  Abdominal: Soft. Bowel sounds are normal. Exhibits no distension and no mass. There is no tenderness.  Musculoskeletal: Normal range of motion. Exhibits no edema.  Lymphadenopathy:    No cervical adenopathy.  Neurological: Alert and oriented to person, place, and time. Exhibits normal muscle tone. Gait normal. Coordination normal.  Skin: Skin is warm and dry. No rash noted. Not diaphoretic. No erythema. No pallor.  Psychiatric: Mood, memory and judgment normal.  Vitals reviewed.  LABORATORY DATA: Lab Results  Component Value Date   WBC 7.2 09/29/2017   HGB 9.6 (L) 09/29/2017   HCT 28.4 (L) 09/29/2017   MCV 95.9 09/29/2017   PLT 252 09/29/2017      Chemistry      Component Value Date/Time   NA 132 (L) 09/29/2017 1220   K 4.5 09/29/2017 1220   CL 97 (L) 09/29/2017 1220   CO2 27 09/29/2017 1220   BUN 13 09/29/2017 1220   CREATININE 0.84 09/29/2017 1220      Component Value Date/Time   CALCIUM 9.2 09/29/2017 1220   ALKPHOS 128 09/29/2017 1220   AST 14 09/29/2017 1220   ALT 10 09/29/2017 1220   BILITOT 0.3 09/29/2017 1220       RADIOGRAPHIC STUDIES:  Ct Chest W Contrast  Result Date: 09/25/2017 CLINICAL DATA:  Follow-up lung cancer. EXAM: CT CHEST, ABDOMEN, AND PELVIS WITH CONTRAST TECHNIQUE: Multidetector CT imaging of the chest, abdomen and pelvis was performed following the  standard protocol during bolus administration of intravenous contrast. CONTRAST:  26mL ISOVUE-300 IOPAMIDOL (ISOVUE-300) INJECTION 61% COMPARISON:  CT chest 06/30/2017 and PET-CT 07/17/2017. FINDINGS: CT CHEST FINDINGS Cardiovascular: The heart size appears normal. Aortic atherosclerosis. Calcification in the RCA, LAD, and left circumflex coronary artery noted. No pericardial effusion. Mediastinum/Nodes: Small subcentimeter low-density thyroid nodules noted. The trachea appears patent and is midline. Normal appearance of the esophagus. Index left paratracheal node measures  1.3 cm, image 23/2. Decreased from 1.8 cm previously. Significant improvement in left hilar adenopathy. Index lymph node 1.1 cm, image 30/2. Previously 1.6 cm. Index left hilar lymph node measures 7 mm, image 29/2. Previously 1.7 cm. Lungs/Pleura: No pleural effusion. Tumor extending along the bronchus to the lateral segment of left upper lobe measures 2.6 x 0.7 cm, image 65/6. Previously this measured 3.0 x 1.0. The pleural mass overlying the lateral left upper lobe measures 3.1 x 1.6 cm, image 56/6. This is compared with 4.6 x 1.6 cm previously. Scattered lower lobe predominant sub solid nodules in both lungs are again noted and appear less conspicuous than on the previous exam. Favor resolving inflammation/infection. No new or progressive sites of disease within the lungs. Musculoskeletal: Spondylosis noted within the thoracic spine. No aggressive lytic or sclerotic bone lesions identified. CT ABDOMEN PELVIS FINDINGS Hepatobiliary: Segment 4B liver metastasis is no longer measurable. No new liver lesions. Gallbladder is unremarkable. No biliary dilatation. Pancreas: Unremarkable. No pancreatic ductal dilatation or surrounding inflammatory changes. Spleen: Normal in size without focal abnormality. Adrenals/Urinary Tract: Normal appearance of the adrenal glands. Small low-density structure in the posterior cortex of left kidney is unchanged and  too small to characterize measuring 5 mm. No mass or hydronephrosis identified. The urinary bladder appears normal. Stomach/Bowel: Stomach is unremarkable. The small bowel loops have a normal course and caliber without obstruction. Normal appearance of the colon. Vascular/Lymphatic: Aortic atherosclerosis. The infrarenal abdominal aorta measures 2.9 cm in maximum AP dimension. No adenopathy identified within the abdomen or pelvis. No inguinal adenopathy. Reproductive: Uterus and bilateral adnexa are unremarkable. Other: No free fluid or fluid collections. Musculoskeletal: Asymmetric sclerosis within the posterior column of the right acetabulum (where there was hypermetabolism on previous PET-CT) is identified indicating healing bone metastases. Insufficiency fracture involving the right sacral wing is suspected, image 80/4. IMPRESSION: 1. Interval response to therapy. There has been decrease in size of left mediastinal and left hilar adenopathy. The left upper lobe mass has also decreased in size. The segment 4 B liver metastasis is no longer measurable. A healing bone metastasis within the posterior column of the right acetabulum noted. 2. Suspect insufficiency fracture involving the right sacral wing. 3. Aortic atherosclerosis and 3 vessel coronary artery atherosclerotic calcifications. Aortic Atherosclerosis (ICD10-I70.0). 4. Infrarenal abdominal aortic ectasia. 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 Recommend followup by ultrasound in 5 years. This recommendation follows ACR consensus guidelines: White Paper of the ACR Incidental Findings Committee II on Vascular Findings. J Am Coll Radiol 2013; 10:789-794. Electronically Signed   By: Kerby Moors M.D.   On: 09/25/2017 15:47   Ct Abdomen Pelvis W Contrast  Result Date: 09/25/2017 CLINICAL DATA:  Follow-up lung cancer. EXAM: CT CHEST, ABDOMEN, AND PELVIS WITH CONTRAST TECHNIQUE: Multidetector CT imaging of the chest, abdomen and pelvis  was performed following the standard protocol during bolus administration of intravenous contrast. CONTRAST:  2mL ISOVUE-300 IOPAMIDOL (ISOVUE-300) INJECTION 61% COMPARISON:  CT chest 06/30/2017 and PET-CT 07/17/2017. FINDINGS: CT CHEST FINDINGS Cardiovascular: The heart size appears normal. Aortic atherosclerosis. Calcification in the RCA, LAD, and left circumflex coronary artery noted. No pericardial effusion. Mediastinum/Nodes: Small subcentimeter low-density thyroid nodules noted. The trachea appears patent and is midline. Normal appearance of the esophagus. Index left paratracheal node measures 1.3 cm, image 23/2. Decreased from 1.8 cm previously. Significant improvement in left hilar adenopathy. Index lymph node 1.1 cm, image 30/2. Previously 1.6 cm. Index left hilar lymph node measures 7 mm, image 29/2. Previously 1.7  cm. Lungs/Pleura: No pleural effusion. Tumor extending along the bronchus to the lateral segment of left upper lobe measures 2.6 x 0.7 cm, image 65/6. Previously this measured 3.0 x 1.0. The pleural mass overlying the lateral left upper lobe measures 3.1 x 1.6 cm, image 56/6. This is compared with 4.6 x 1.6 cm previously. Scattered lower lobe predominant sub solid nodules in both lungs are again noted and appear less conspicuous than on the previous exam. Favor resolving inflammation/infection. No new or progressive sites of disease within the lungs. Musculoskeletal: Spondylosis noted within the thoracic spine. No aggressive lytic or sclerotic bone lesions identified. CT ABDOMEN PELVIS FINDINGS Hepatobiliary: Segment 4B liver metastasis is no longer measurable. No new liver lesions. Gallbladder is unremarkable. No biliary dilatation. Pancreas: Unremarkable. No pancreatic ductal dilatation or surrounding inflammatory changes. Spleen: Normal in size without focal abnormality. Adrenals/Urinary Tract: Normal appearance of the adrenal glands. Small low-density structure in the posterior cortex of  left kidney is unchanged and too small to characterize measuring 5 mm. No mass or hydronephrosis identified. The urinary bladder appears normal. Stomach/Bowel: Stomach is unremarkable. The small bowel loops have a normal course and caliber without obstruction. Normal appearance of the colon. Vascular/Lymphatic: Aortic atherosclerosis. The infrarenal abdominal aorta measures 2.9 cm in maximum AP dimension. No adenopathy identified within the abdomen or pelvis. No inguinal adenopathy. Reproductive: Uterus and bilateral adnexa are unremarkable. Other: No free fluid or fluid collections. Musculoskeletal: Asymmetric sclerosis within the posterior column of the right acetabulum (where there was hypermetabolism on previous PET-CT) is identified indicating healing bone metastases. Insufficiency fracture involving the right sacral wing is suspected, image 80/4. IMPRESSION: 1. Interval response to therapy. There has been decrease in size of left mediastinal and left hilar adenopathy. The left upper lobe mass has also decreased in size. The segment 4 B liver metastasis is no longer measurable. A healing bone metastasis within the posterior column of the right acetabulum noted. 2. Suspect insufficiency fracture involving the right sacral wing. 3. Aortic atherosclerosis and 3 vessel coronary artery atherosclerotic calcifications. Aortic Atherosclerosis (ICD10-I70.0). 4. Infrarenal abdominal aortic ectasia. 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 Recommend followup by ultrasound in 5 years. This recommendation follows ACR consensus guidelines: White Paper of the ACR Incidental Findings Committee II on Vascular Findings. J Am Coll Radiol 2013; 10:789-794. Electronically Signed   By: Kerby Moors M.D.   On: 09/25/2017 15:47   Dg Chest Portable 1 View  Result Date: 09/12/2017 CLINICAL DATA:  Fatigue, weakness, dizziness and nausea for the past 2 days. Stage IV small cell lung cancer. Marked leukocytosis. EXAM:  PORTABLE CHEST 1 VIEW COMPARISON:  08/26/2017.  Chest CT dated 08/08/2017. FINDINGS: Interval irregular appearance of the left hilum. Increased pleural based density in the left lateral upper chest. Clear right lung. The lungs remain hyperexpanded with mildly prominent interstitial markings. Diffuse osteopenia. Coronary artery stent. IMPRESSION: 1. Interval increase in size of the patient's known malignancy in the lateral aspect of the left upper lobe. 2. Interval irregular appearance of the left hilum. This could be due to an interval mass or superimposed densities. 3. Stable changes of COPD. Electronically Signed   By: Claudie Revering M.D.   On: 09/12/2017 18:22     ASSESSMENT/PLAN:  Primary small cell carcinoma of left lung [facility]) This is a very pleasant 77 year old white female recently diagnosed with extensive stage small cell lung cancer and currently undergoing systemic chemotherapy with carboplatin and etoposide.  Carboplatin was dose reduced to an  AUC of 4 and etoposide was dose reduced to 80 mg/m beginning with cycle #3.  She tolerated the dose reduction much better.   She had a recent restaging CT scan of the chest, abdomen, pelvis.  The patient was seen with Dr. Julien Nordmann.  CT scan results were discussed with the patient and her daughter which showed a response to therapy.  Recommend that she continue her current therapy for a total of 6 cycles.  She will proceed with cycle 4 today as scheduled. The patient will follow-up weekly for labs and will be seen back for follow-up visit in 3 weeks for evaluation prior to cycle 5 of her treatment.  For the malnutrition and weight loss she is followed by the dietitian at the cancer center.  The patient was advised to call immediately if she has any concerning symptoms in the interval. The patient voices understanding of current disease status and treatment options and is in agreement with the current care plan.  All questions were answered. The  patient knows to call the clinic with any problems, questions or concerns. We can certainly see the patient much sooner if necessary.   No orders of the defined types were placed in this encounter.  Mikey Bussing, DNP, AGPCNP-BC, AOCNP 09/29/17  ADDENDUM: Hematology/Oncology Attending: I had a face-to-face encounter with the patient today.  I recommended her care plan.  This is a very pleasant 77 years old white female with extensive stage small cell lung cancer and currently undergoing systemic chemotherapy with carboplatin and etoposide status post 3 cycles.  She has been tolerating the treatment well except for increasing fatigue and weakness as well as lack of appetite and dehydration.  The patient has been seen at the symptom management clinic several times recently with dehydration and poor p.o. intake.  She was giving IV hydration with improvement in her condition. The patient had repeat CT scan of the chest, abdomen and pelvis performed recently.  I personally and independently reviewed the scans and discussed the results with the patient and her daughter.  Her scan showed further improvement of her disease.  I recommended for her to continue her current treatment with carboplatin for AUC of 4 on day 1 and etoposide 80 mg/M2 on days 1, 2 and 3 with Neulasta support on day 5 as previously prescribed. The patient will come back for follow-up visit in 3 weeks for evaluation before the next cycle of her treatment. The patient was advised to call immediately if she has any concerning symptoms in the interval.  Disclaimer: This note was dictated with voice recognition software. Similar sounding words can inadvertently be transcribed and may be missed upon review. [name], MD 09/29/17

## 2017-09-29 ENCOUNTER — Inpatient Hospital Stay (HOSPITAL_BASED_OUTPATIENT_CLINIC_OR_DEPARTMENT_OTHER): Payer: Medicare Other | Admitting: Oncology

## 2017-09-29 ENCOUNTER — Encounter: Payer: Self-pay | Admitting: Oncology

## 2017-09-29 ENCOUNTER — Inpatient Hospital Stay: Payer: Medicare Other

## 2017-09-29 ENCOUNTER — Other Ambulatory Visit: Payer: Self-pay

## 2017-09-29 ENCOUNTER — Telehealth: Payer: Self-pay

## 2017-09-29 VITALS — BP 144/67 | HR 72 | Temp 99.0°F | Resp 17 | Ht 61.0 in | Wt 80.2 lb

## 2017-09-29 DIAGNOSIS — Z5111 Encounter for antineoplastic chemotherapy: Secondary | ICD-10-CM | POA: Diagnosis not present

## 2017-09-29 DIAGNOSIS — R630 Anorexia: Secondary | ICD-10-CM

## 2017-09-29 DIAGNOSIS — C3412 Malignant neoplasm of upper lobe, left bronchus or lung: Secondary | ICD-10-CM | POA: Diagnosis not present

## 2017-09-29 DIAGNOSIS — R5383 Other fatigue: Secondary | ICD-10-CM | POA: Diagnosis not present

## 2017-09-29 DIAGNOSIS — C3492 Malignant neoplasm of unspecified part of left bronchus or lung: Secondary | ICD-10-CM

## 2017-09-29 DIAGNOSIS — C778 Secondary and unspecified malignant neoplasm of lymph nodes of multiple regions: Secondary | ICD-10-CM | POA: Diagnosis not present

## 2017-09-29 DIAGNOSIS — C787 Secondary malignant neoplasm of liver and intrahepatic bile duct: Secondary | ICD-10-CM | POA: Diagnosis not present

## 2017-09-29 DIAGNOSIS — D72823 Leukemoid reaction: Secondary | ICD-10-CM | POA: Diagnosis not present

## 2017-09-29 DIAGNOSIS — E860 Dehydration: Secondary | ICD-10-CM

## 2017-09-29 DIAGNOSIS — Z95828 Presence of other vascular implants and grafts: Secondary | ICD-10-CM

## 2017-09-29 DIAGNOSIS — C7951 Secondary malignant neoplasm of bone: Secondary | ICD-10-CM

## 2017-09-29 LAB — CMP (CANCER CENTER ONLY)
ALT: 10 U/L (ref 0–55)
AST: 14 U/L (ref 5–34)
Albumin: 3.5 g/dL (ref 3.5–5.0)
Alkaline Phosphatase: 128 U/L (ref 40–150)
Anion gap: 8 (ref 3–11)
BUN: 13 mg/dL (ref 7–26)
CALCIUM: 9.2 mg/dL (ref 8.4–10.4)
CO2: 27 mmol/L (ref 22–29)
Chloride: 97 mmol/L — ABNORMAL LOW (ref 98–109)
Creatinine: 0.84 mg/dL (ref 0.60–1.10)
GFR, Estimated: >60 mL/min (ref >60)
Glucose, Bld: 269 mg/dL — ABNORMAL HIGH (ref 70–140)
Potassium: 4.5 mmol/L (ref 3.5–5.1)
SODIUM: 132 mmol/L — AB (ref 136–145)
Total Bilirubin: 0.3 mg/dL (ref 0.2–1.2)
Total Protein: 6.7 g/dL (ref 6.4–8.3)

## 2017-09-29 LAB — CBC WITH DIFFERENTIAL (CANCER CENTER ONLY)
Basophils Absolute: 0.1 10*3/uL (ref 0.0–0.1)
Basophils Relative: 1 %
EOS ABS: 0 10*3/uL (ref 0.0–0.5)
EOS PCT: 0 %
HCT: 28.4 % — ABNORMAL LOW (ref 34.8–46.6)
Hemoglobin: 9.6 g/dL — ABNORMAL LOW (ref 11.6–15.9)
LYMPHS ABS: 1.7 10*3/uL (ref 0.9–3.3)
Lymphocytes Relative: 23 %
MCH: 32.5 pg (ref 25.1–34.0)
MCHC: 33.9 g/dL (ref 31.5–36.0)
MCV: 95.9 fL (ref 79.5–101.0)
MONO ABS: 0.5 10*3/uL (ref 0.1–0.9)
Monocytes Relative: 7 %
Neutro Abs: 5 10*3/uL (ref 1.5–6.5)
Neutrophils Relative %: 69 %
PLATELETS: 252 10*3/uL (ref 145–400)
RBC: 2.96 MIL/uL — ABNORMAL LOW (ref 3.70–5.45)
RDW: 21.9 % — ABNORMAL HIGH (ref 11.2–14.5)
WBC Count: 7.2 10*3/uL (ref 3.9–10.3)

## 2017-09-29 MED ORDER — SODIUM CHLORIDE 0.9 % IV SOLN
220.0000 mg | Freq: Once | INTRAVENOUS | Status: AC
  Administered 2017-09-29: 220 mg via INTRAVENOUS
  Filled 2017-09-29: qty 22

## 2017-09-29 MED ORDER — SODIUM CHLORIDE 0.9 % IV SOLN
Freq: Once | INTRAVENOUS | Status: AC
  Administered 2017-09-29: 15:00:00 via INTRAVENOUS
  Filled 2017-09-29: qty 5

## 2017-09-29 MED ORDER — SODIUM CHLORIDE 0.9% FLUSH
10.0000 mL | INTRAVENOUS | Status: DC | PRN
  Administered 2017-09-29: 10 mL
  Filled 2017-09-29: qty 10

## 2017-09-29 MED ORDER — SODIUM CHLORIDE 0.9 % IV SOLN
80.0000 mg/m2 | Freq: Once | INTRAVENOUS | Status: AC
  Administered 2017-09-29: 100 mg via INTRAVENOUS
  Filled 2017-09-29: qty 5

## 2017-09-29 MED ORDER — PALONOSETRON HCL INJECTION 0.25 MG/5ML
0.2500 mg | Freq: Once | INTRAVENOUS | Status: AC
  Administered 2017-09-29: 0.25 mg via INTRAVENOUS

## 2017-09-29 MED ORDER — SODIUM CHLORIDE 0.9% FLUSH
10.0000 mL | Freq: Once | INTRAVENOUS | Status: AC
  Administered 2017-09-29: 10 mL
  Filled 2017-09-29: qty 10

## 2017-09-29 MED ORDER — HEPARIN SOD (PORK) LOCK FLUSH 100 UNIT/ML IV SOLN
500.0000 [IU] | Freq: Once | INTRAVENOUS | Status: AC | PRN
  Administered 2017-09-29: 500 [IU]
  Filled 2017-09-29: qty 5

## 2017-09-29 MED ORDER — PALONOSETRON HCL INJECTION 0.25 MG/5ML
INTRAVENOUS | Status: AC
  Filled 2017-09-29: qty 5

## 2017-09-29 MED ORDER — SODIUM CHLORIDE 0.9 % IV SOLN
Freq: Once | INTRAVENOUS | Status: AC
  Administered 2017-09-29: 15:00:00 via INTRAVENOUS

## 2017-09-29 NOTE — Patient Instructions (Signed)
[facility] Discharge Instructions for Patients Receiving Chemotherapy  Today you received the following chemotherapy agents: Carboplatin and Etoposide.   To help prevent nausea and vomiting after your treatment, we encourage you to take your nausea medication as prescribed.   If you develop nausea and vomiting that is not controlled by your nausea medication, call the clinic.   BELOW ARE SYMPTOMS THAT SHOULD BE REPORTED IMMEDIATELY:  *FEVER GREATER THAN 100.5 F  *CHILLS WITH OR WITHOUT FEVER  NAUSEA AND VOMITING THAT IS NOT CONTROLLED WITH YOUR NAUSEA MEDICATION  *UNUSUAL SHORTNESS OF BREATH  *UNUSUAL BRUISING OR BLEEDING  TENDERNESS IN MOUTH AND THROAT WITH OR WITHOUT PRESENCE OF ULCERS  *URINARY PROBLEMS  *BOWEL PROBLEMS  UNUSUAL RASH Items with * indicate a potential emergency and should be followed up as soon as possible.  Feel free to call the clinic should you have any questions or concerns. The clinic phone number is [phone].  Please show the [place] at check-in to the Emergency Department and triage nurse.

## 2017-09-29 NOTE — Telephone Encounter (Signed)
Printed avs and calender of upcoming appointment. Per 6/24 los

## 2017-09-30 ENCOUNTER — Inpatient Hospital Stay: Payer: Medicare Other

## 2017-09-30 VITALS — BP 129/61 | HR 66 | Temp 98.7°F | Resp 17

## 2017-09-30 DIAGNOSIS — C3412 Malignant neoplasm of upper lobe, left bronchus or lung: Secondary | ICD-10-CM

## 2017-09-30 DIAGNOSIS — D72823 Leukemoid reaction: Secondary | ICD-10-CM | POA: Diagnosis not present

## 2017-09-30 DIAGNOSIS — C787 Secondary malignant neoplasm of liver and intrahepatic bile duct: Secondary | ICD-10-CM | POA: Diagnosis not present

## 2017-09-30 DIAGNOSIS — Z5111 Encounter for antineoplastic chemotherapy: Secondary | ICD-10-CM | POA: Diagnosis not present

## 2017-09-30 DIAGNOSIS — C778 Secondary and unspecified malignant neoplasm of lymph nodes of multiple regions: Secondary | ICD-10-CM | POA: Diagnosis not present

## 2017-09-30 DIAGNOSIS — C7951 Secondary malignant neoplasm of bone: Secondary | ICD-10-CM | POA: Diagnosis not present

## 2017-09-30 MED ORDER — SODIUM CHLORIDE 0.9 % IV SOLN
Freq: Once | INTRAVENOUS | Status: AC
  Administered 2017-09-30: 16:00:00 via INTRAVENOUS

## 2017-09-30 MED ORDER — HEPARIN SOD (PORK) LOCK FLUSH 100 UNIT/ML IV SOLN
500.0000 [IU] | Freq: Once | INTRAVENOUS | Status: AC | PRN
  Administered 2017-09-30: 500 [IU]
  Filled 2017-09-30: qty 5

## 2017-09-30 MED ORDER — ETOPOSIDE CHEMO INJECTION 1 GM/50ML
80.0000 mg/m2 | Freq: Once | INTRAVENOUS | Status: AC
  Administered 2017-09-30: 100 mg via INTRAVENOUS
  Filled 2017-09-30: qty 5

## 2017-09-30 MED ORDER — DEXAMETHASONE SODIUM PHOSPHATE 10 MG/ML IJ SOLN
INTRAMUSCULAR | Status: AC
  Filled 2017-09-30: qty 1

## 2017-09-30 MED ORDER — SODIUM CHLORIDE 0.9% FLUSH
10.0000 mL | INTRAVENOUS | Status: DC | PRN
  Administered 2017-09-30: 10 mL
  Filled 2017-09-30: qty 10

## 2017-09-30 MED ORDER — DEXAMETHASONE SODIUM PHOSPHATE 10 MG/ML IJ SOLN
10.0000 mg | Freq: Once | INTRAMUSCULAR | Status: AC
  Administered 2017-09-30: 10 mg via INTRAVENOUS

## 2017-09-30 NOTE — Progress Notes (Signed)
Nutrition Follow-up:  Patient with small cell lung cancer.    Met with patient and daughter during infusion this pm.  Patient reports appetite is better.  Reports ate cheerios for breakfast yesterday and [place] sandwich for lunch and then subway sandwich for supper (100% of it).  Reports that she is trying to drink 3 boost shakes per day (thinks she got the plus)  Medications: reviewed  Labs: reviewed  Anthropometrics:   Noted weight increased to 80 lb 3.2 oz on 6/24 from 78 lb 14.4 oz noted on 6/3.     NUTRITION DIAGNOSIS: Inadequate oral intake improving   MALNUTRITION DIAGNOSIS: severe malnutrition improving with weight gain   INTERVENTION:   Encouraged high calorie, high protein foods Encouraged purchasing >350 calorie shakes to promote weight gain    MONITORING, EVALUATION, GOAL: Patient will tolerate increased calories and protein to promote weight gain   NEXT VISIT: August 6 during infusion  Maalik Pinn B. Zenia Resides, [place], [place] Registered Dietitian [phone] (pager)

## 2017-09-30 NOTE — Patient Instructions (Signed)
[facility] Discharge Instructions for Patients Receiving Chemotherapy  Today you received the following chemotherapy agents :  Etoposide.  To help prevent nausea and vomiting after your treatment, we encourage you to take your nausea medication as prescribed.   If you develop nausea and vomiting that is not controlled by your nausea medication, call the clinic.   BELOW ARE SYMPTOMS THAT SHOULD BE REPORTED IMMEDIATELY:  *FEVER GREATER THAN 100.5 F  *CHILLS WITH OR WITHOUT FEVER  NAUSEA AND VOMITING THAT IS NOT CONTROLLED WITH YOUR NAUSEA MEDICATION  *UNUSUAL SHORTNESS OF BREATH  *UNUSUAL BRUISING OR BLEEDING  TENDERNESS IN MOUTH AND THROAT WITH OR WITHOUT PRESENCE OF ULCERS  *URINARY PROBLEMS  *BOWEL PROBLEMS  UNUSUAL RASH Items with * indicate a potential emergency and should be followed up as soon as possible.  Feel free to call the clinic should you have any questions or concerns. The clinic phone number is [phone].  Please show the [place] at check-in to the Emergency Department and triage nurse.

## 2017-10-01 ENCOUNTER — Inpatient Hospital Stay: Payer: Medicare Other

## 2017-10-01 ENCOUNTER — Other Ambulatory Visit: Payer: Medicare Other

## 2017-10-01 VITALS — BP 142/50 | HR 68 | Temp 99.2°F | Resp 18

## 2017-10-01 DIAGNOSIS — C3412 Malignant neoplasm of upper lobe, left bronchus or lung: Secondary | ICD-10-CM

## 2017-10-01 DIAGNOSIS — D72823 Leukemoid reaction: Secondary | ICD-10-CM | POA: Diagnosis not present

## 2017-10-01 DIAGNOSIS — C778 Secondary and unspecified malignant neoplasm of lymph nodes of multiple regions: Secondary | ICD-10-CM | POA: Diagnosis not present

## 2017-10-01 DIAGNOSIS — C787 Secondary malignant neoplasm of liver and intrahepatic bile duct: Secondary | ICD-10-CM | POA: Diagnosis not present

## 2017-10-01 DIAGNOSIS — Z5111 Encounter for antineoplastic chemotherapy: Secondary | ICD-10-CM | POA: Diagnosis not present

## 2017-10-01 DIAGNOSIS — C7951 Secondary malignant neoplasm of bone: Secondary | ICD-10-CM | POA: Diagnosis not present

## 2017-10-01 MED ORDER — SODIUM CHLORIDE 0.9 % IV SOLN
80.0000 mg/m2 | Freq: Once | INTRAVENOUS | Status: AC
  Administered 2017-10-01: 100 mg via INTRAVENOUS
  Filled 2017-10-01: qty 5

## 2017-10-01 MED ORDER — PALONOSETRON HCL INJECTION 0.25 MG/5ML
INTRAVENOUS | Status: AC
  Filled 2017-10-01: qty 5

## 2017-10-01 MED ORDER — HEPARIN SOD (PORK) LOCK FLUSH 100 UNIT/ML IV SOLN
500.0000 [IU] | Freq: Once | INTRAVENOUS | Status: AC | PRN
  Administered 2017-10-01: 500 [IU]
  Filled 2017-10-01: qty 5

## 2017-10-01 MED ORDER — PEGFILGRASTIM 6 MG/0.6ML ~~LOC~~ PSKT
6.0000 mg | PREFILLED_SYRINGE | Freq: Once | SUBCUTANEOUS | Status: AC
  Administered 2017-10-01: 6 mg via SUBCUTANEOUS

## 2017-10-01 MED ORDER — SODIUM CHLORIDE 0.9 % IV SOLN
Freq: Once | INTRAVENOUS | Status: AC
  Administered 2017-10-01: 16:00:00 via INTRAVENOUS
  Filled 2017-10-01: qty 5

## 2017-10-01 MED ORDER — PALONOSETRON HCL INJECTION 0.25 MG/5ML
0.2500 mg | Freq: Once | INTRAVENOUS | Status: AC
  Administered 2017-10-01: 0.25 mg via INTRAVENOUS

## 2017-10-01 MED ORDER — SODIUM CHLORIDE 0.9 % IV SOLN
Freq: Once | INTRAVENOUS | Status: AC
  Administered 2017-10-01: 16:00:00 via INTRAVENOUS

## 2017-10-01 MED ORDER — SODIUM CHLORIDE 0.9% FLUSH
10.0000 mL | INTRAVENOUS | Status: DC | PRN
  Administered 2017-10-01: 10 mL
  Filled 2017-10-01: qty 10

## 2017-10-01 MED ORDER — PEGFILGRASTIM 6 MG/0.6ML ~~LOC~~ PSKT
PREFILLED_SYRINGE | SUBCUTANEOUS | Status: AC
  Filled 2017-10-01: qty 0.6

## 2017-10-01 NOTE — Patient Instructions (Signed)
[facility] Discharge Instructions for Patients Receiving Chemotherapy  Today you received the following chemotherapy agent :  Etoposide.  To help prevent nausea and vomiting after your treatment, we encourage you to take your nausea medication as prescribed.   If you develop nausea and vomiting that is not controlled by your nausea medication, call the clinic.   BELOW ARE SYMPTOMS THAT SHOULD BE REPORTED IMMEDIATELY:  *FEVER GREATER THAN 100.5 F  *CHILLS WITH OR WITHOUT FEVER  NAUSEA AND VOMITING THAT IS NOT CONTROLLED WITH YOUR NAUSEA MEDICATION  *UNUSUAL SHORTNESS OF BREATH  *UNUSUAL BRUISING OR BLEEDING  TENDERNESS IN MOUTH AND THROAT WITH OR WITHOUT PRESENCE OF ULCERS  *URINARY PROBLEMS  *BOWEL PROBLEMS  UNUSUAL RASH Items with * indicate a potential emergency and should be followed up as soon as possible.  Feel free to call the clinic should you have any questions or concerns. The clinic phone number is [phone].  Please show the [facility] at check-in to the Emergency Department and triage nurse.

## 2017-10-06 ENCOUNTER — Inpatient Hospital Stay: Payer: Medicare Other | Attending: Internal Medicine

## 2017-10-06 ENCOUNTER — Inpatient Hospital Stay: Payer: Medicare Other

## 2017-10-06 DIAGNOSIS — R5383 Other fatigue: Secondary | ICD-10-CM | POA: Diagnosis not present

## 2017-10-06 DIAGNOSIS — R634 Abnormal weight loss: Secondary | ICD-10-CM | POA: Insufficient documentation

## 2017-10-06 DIAGNOSIS — C778 Secondary and unspecified malignant neoplasm of lymph nodes of multiple regions: Secondary | ICD-10-CM | POA: Insufficient documentation

## 2017-10-06 DIAGNOSIS — C787 Secondary malignant neoplasm of liver and intrahepatic bile duct: Secondary | ICD-10-CM | POA: Diagnosis not present

## 2017-10-06 DIAGNOSIS — R110 Nausea: Secondary | ICD-10-CM | POA: Insufficient documentation

## 2017-10-06 DIAGNOSIS — Z95828 Presence of other vascular implants and grafts: Secondary | ICD-10-CM

## 2017-10-06 DIAGNOSIS — Z452 Encounter for adjustment and management of vascular access device: Secondary | ICD-10-CM | POA: Insufficient documentation

## 2017-10-06 DIAGNOSIS — Z5111 Encounter for antineoplastic chemotherapy: Secondary | ICD-10-CM | POA: Diagnosis not present

## 2017-10-06 DIAGNOSIS — C3412 Malignant neoplasm of upper lobe, left bronchus or lung: Secondary | ICD-10-CM | POA: Diagnosis not present

## 2017-10-06 DIAGNOSIS — D72829 Elevated white blood cell count, unspecified: Secondary | ICD-10-CM | POA: Insufficient documentation

## 2017-10-06 DIAGNOSIS — E860 Dehydration: Secondary | ICD-10-CM | POA: Diagnosis not present

## 2017-10-06 DIAGNOSIS — C7951 Secondary malignant neoplasm of bone: Secondary | ICD-10-CM | POA: Diagnosis not present

## 2017-10-06 DIAGNOSIS — C3492 Malignant neoplasm of unspecified part of left bronchus or lung: Secondary | ICD-10-CM

## 2017-10-06 DIAGNOSIS — E46 Unspecified protein-calorie malnutrition: Secondary | ICD-10-CM | POA: Diagnosis not present

## 2017-10-06 LAB — CBC WITH DIFFERENTIAL (CANCER CENTER ONLY)
BASOS PCT: 0 %
Basophils Absolute: 0 10*3/uL (ref 0.0–0.1)
Eosinophils Absolute: 0 10*3/uL (ref 0.0–0.5)
Eosinophils Relative: 0 %
HEMATOCRIT: 27.3 % — AB (ref 34.8–46.6)
Hemoglobin: 9.2 g/dL — ABNORMAL LOW (ref 11.6–15.9)
Lymphocytes Relative: 23 %
Lymphs Abs: 2.1 10*3/uL (ref 0.9–3.3)
MCH: 32.6 pg (ref 25.1–34.0)
MCHC: 33.7 g/dL (ref 31.5–36.0)
MCV: 96.6 fL (ref 79.5–101.0)
MONOS PCT: 3 %
Monocytes Absolute: 0.3 10*3/uL (ref 0.1–0.9)
Neutro Abs: 6.9 10*3/uL — ABNORMAL HIGH (ref 1.5–6.5)
Neutrophils Relative %: 74 %
Platelet Count: 257 10*3/uL (ref 145–400)
RBC: 2.83 MIL/uL — ABNORMAL LOW (ref 3.70–5.45)
RDW: 21.2 % — ABNORMAL HIGH (ref 11.2–14.5)
WBC Count: 9.4 10*3/uL (ref 3.9–10.3)

## 2017-10-06 LAB — CMP (CANCER CENTER ONLY)
ALBUMIN: 3.6 g/dL (ref 3.5–5.0)
ALK PHOS: 167 U/L — AB (ref 38–126)
ALT: 13 U/L (ref 0–44)
AST: 14 U/L — AB (ref 15–41)
Anion gap: 8 (ref 5–15)
BUN: 17 mg/dL (ref 8–23)
CALCIUM: 9.3 mg/dL (ref 8.9–10.3)
CO2: 28 mmol/L (ref 22–32)
CREATININE: 0.78 mg/dL (ref 0.44–1.00)
Chloride: 95 mmol/L — ABNORMAL LOW (ref 98–111)
GFR, Est AFR Am: >60 mL/min (ref >60)
GFR, Estimated: >60 mL/min (ref >60)
Glucose, Bld: 160 mg/dL — ABNORMAL HIGH (ref 70–99)
Potassium: 4.3 mmol/L (ref 3.5–5.1)
SODIUM: 131 mmol/L — AB (ref 135–145)
Total Bilirubin: 0.3 mg/dL (ref 0.3–1.2)
Total Protein: 6.5 g/dL (ref 6.5–8.1)

## 2017-10-06 MED ORDER — SODIUM CHLORIDE 0.9% FLUSH
10.0000 mL | Freq: Once | INTRAVENOUS | Status: DC
  Filled 2017-10-06: qty 10

## 2017-10-06 MED ORDER — HEPARIN SOD (PORK) LOCK FLUSH 100 UNIT/ML IV SOLN
500.0000 [IU] | Freq: Once | INTRAVENOUS | Status: DC
  Filled 2017-10-06: qty 5

## 2017-10-13 ENCOUNTER — Inpatient Hospital Stay: Payer: Medicare Other

## 2017-10-13 DIAGNOSIS — R110 Nausea: Secondary | ICD-10-CM | POA: Diagnosis not present

## 2017-10-13 DIAGNOSIS — Z95828 Presence of other vascular implants and grafts: Secondary | ICD-10-CM

## 2017-10-13 DIAGNOSIS — C3492 Malignant neoplasm of unspecified part of left bronchus or lung: Secondary | ICD-10-CM

## 2017-10-13 DIAGNOSIS — C7951 Secondary malignant neoplasm of bone: Secondary | ICD-10-CM | POA: Diagnosis not present

## 2017-10-13 DIAGNOSIS — C787 Secondary malignant neoplasm of liver and intrahepatic bile duct: Secondary | ICD-10-CM | POA: Diagnosis not present

## 2017-10-13 DIAGNOSIS — C3412 Malignant neoplasm of upper lobe, left bronchus or lung: Secondary | ICD-10-CM | POA: Diagnosis not present

## 2017-10-13 DIAGNOSIS — C778 Secondary and unspecified malignant neoplasm of lymph nodes of multiple regions: Secondary | ICD-10-CM | POA: Diagnosis not present

## 2017-10-13 DIAGNOSIS — Z5111 Encounter for antineoplastic chemotherapy: Secondary | ICD-10-CM | POA: Diagnosis not present

## 2017-10-13 LAB — CBC WITH DIFFERENTIAL (CANCER CENTER ONLY)
BASOS ABS: 0.1 10*3/uL (ref 0.0–0.1)
Basophils Relative: 0 %
EOS PCT: 0 %
Eosinophils Absolute: 0 10*3/uL (ref 0.0–0.5)
HEMATOCRIT: 26.2 % — AB (ref 34.8–46.6)
HEMOGLOBIN: 8.8 g/dL — AB (ref 11.6–15.9)
LYMPHS ABS: 2.6 10*3/uL (ref 0.9–3.3)
LYMPHS PCT: 20 %
MCH: 33 pg (ref 25.1–34.0)
MCHC: 33.6 g/dL (ref 31.5–36.0)
MCV: 98.1 fL (ref 79.5–101.0)
Monocytes Absolute: 1.1 10*3/uL — ABNORMAL HIGH (ref 0.1–0.9)
Monocytes Relative: 8 %
NEUTROS ABS: 9.3 10*3/uL — AB (ref 1.5–6.5)
Neutrophils Relative %: 72 %
Platelet Count: 101 10*3/uL — ABNORMAL LOW (ref 145–400)
RBC: 2.67 MIL/uL — AB (ref 3.70–5.45)
RDW: 20 % — ABNORMAL HIGH (ref 11.2–14.5)
WBC Count: 13 10*3/uL — ABNORMAL HIGH (ref 3.9–10.3)

## 2017-10-13 LAB — CMP (CANCER CENTER ONLY)
ALT: 19 U/L (ref 0–44)
ANION GAP: 5 (ref 5–15)
AST: 15 U/L (ref 15–41)
Albumin: 3.6 g/dL (ref 3.5–5.0)
Alkaline Phosphatase: 150 U/L — ABNORMAL HIGH (ref 38–126)
BUN: 13 mg/dL (ref 8–23)
CO2: 30 mmol/L (ref 22–32)
Calcium: 9.3 mg/dL (ref 8.9–10.3)
Chloride: 99 mmol/L (ref 98–111)
Creatinine: 0.81 mg/dL (ref 0.44–1.00)
GFR, Est AFR Am: >60 mL/min (ref >60)
Glucose, Bld: 188 mg/dL — ABNORMAL HIGH (ref 70–99)
POTASSIUM: 4.2 mmol/L (ref 3.5–5.1)
Sodium: 134 mmol/L — ABNORMAL LOW (ref 135–145)
TOTAL PROTEIN: 7 g/dL (ref 6.5–8.1)

## 2017-10-13 MED ORDER — SODIUM CHLORIDE 0.9% FLUSH
10.0000 mL | Freq: Once | INTRAVENOUS | Status: AC
  Administered 2017-10-13: 10 mL
  Filled 2017-10-13: qty 10

## 2017-10-13 MED ORDER — HEPARIN SOD (PORK) LOCK FLUSH 100 UNIT/ML IV SOLN
250.0000 [IU] | Freq: Once | INTRAVENOUS | Status: AC
  Administered 2017-10-13: 250 [IU]
  Filled 2017-10-13: qty 5

## 2017-10-13 NOTE — Progress Notes (Signed)
Completed 10/13/17

## 2017-10-20 ENCOUNTER — Ambulatory Visit: Payer: Medicare Other | Admitting: Nurse Practitioner

## 2017-10-20 ENCOUNTER — Ambulatory Visit: Payer: Medicare Other

## 2017-10-20 ENCOUNTER — Other Ambulatory Visit: Payer: Medicare Other

## 2017-10-21 ENCOUNTER — Inpatient Hospital Stay: Payer: Medicare Other

## 2017-10-21 ENCOUNTER — Encounter: Payer: Self-pay | Admitting: Oncology

## 2017-10-21 ENCOUNTER — Ambulatory Visit: Payer: Medicare Other

## 2017-10-21 ENCOUNTER — Inpatient Hospital Stay (HOSPITAL_BASED_OUTPATIENT_CLINIC_OR_DEPARTMENT_OTHER): Payer: Medicare Other | Admitting: Oncology

## 2017-10-21 ENCOUNTER — Telehealth: Payer: Self-pay | Admitting: Oncology

## 2017-10-21 VITALS — BP 153/64 | HR 62 | Temp 98.1°F | Resp 17 | Ht 61.0 in | Wt 78.5 lb

## 2017-10-21 VITALS — BP 157/75 | HR 74 | Resp 16

## 2017-10-21 DIAGNOSIS — R110 Nausea: Secondary | ICD-10-CM | POA: Diagnosis not present

## 2017-10-21 DIAGNOSIS — C3412 Malignant neoplasm of upper lobe, left bronchus or lung: Secondary | ICD-10-CM | POA: Diagnosis not present

## 2017-10-21 DIAGNOSIS — R634 Abnormal weight loss: Secondary | ICD-10-CM | POA: Diagnosis not present

## 2017-10-21 DIAGNOSIS — C778 Secondary and unspecified malignant neoplasm of lymph nodes of multiple regions: Secondary | ICD-10-CM | POA: Diagnosis not present

## 2017-10-21 DIAGNOSIS — C787 Secondary malignant neoplasm of liver and intrahepatic bile duct: Secondary | ICD-10-CM | POA: Diagnosis not present

## 2017-10-21 DIAGNOSIS — C3492 Malignant neoplasm of unspecified part of left bronchus or lung: Secondary | ICD-10-CM

## 2017-10-21 DIAGNOSIS — Z5111 Encounter for antineoplastic chemotherapy: Secondary | ICD-10-CM | POA: Diagnosis not present

## 2017-10-21 DIAGNOSIS — C7951 Secondary malignant neoplasm of bone: Secondary | ICD-10-CM | POA: Diagnosis not present

## 2017-10-21 DIAGNOSIS — R5383 Other fatigue: Secondary | ICD-10-CM | POA: Diagnosis not present

## 2017-10-21 DIAGNOSIS — E46 Unspecified protein-calorie malnutrition: Secondary | ICD-10-CM

## 2017-10-21 DIAGNOSIS — Z95828 Presence of other vascular implants and grafts: Secondary | ICD-10-CM

## 2017-10-21 LAB — CBC WITH DIFFERENTIAL (CANCER CENTER ONLY)
BASOS ABS: 0.1 10*3/uL (ref 0.0–0.1)
Basophils Relative: 1 %
Eosinophils Absolute: 0 10*3/uL (ref 0.0–0.5)
Eosinophils Relative: 0 %
HEMATOCRIT: 27.1 % — AB (ref 34.8–46.6)
HEMOGLOBIN: 9.2 g/dL — AB (ref 11.6–15.9)
LYMPHS ABS: 2.1 10*3/uL (ref 0.9–3.3)
LYMPHS PCT: 18 %
MCH: 33.9 pg (ref 25.1–34.0)
MCHC: 34 g/dL (ref 31.5–36.0)
MCV: 99.6 fL (ref 79.5–101.0)
Monocytes Absolute: 0.9 10*3/uL (ref 0.1–0.9)
Monocytes Relative: 8 %
NEUTROS ABS: 8.7 10*3/uL — AB (ref 1.5–6.5)
Neutrophils Relative %: 73 %
Platelet Count: 299 10*3/uL (ref 145–400)
RBC: 2.72 MIL/uL — AB (ref 3.70–5.45)
RDW: 21.9 % — ABNORMAL HIGH (ref 11.2–14.5)
WBC: 11.8 10*3/uL — AB (ref 3.9–10.3)

## 2017-10-21 LAB — CMP (CANCER CENTER ONLY)
ALBUMIN: 3.6 g/dL (ref 3.5–5.0)
ALT: 18 U/L (ref 0–44)
ANION GAP: 8 (ref 5–15)
AST: 16 U/L (ref 15–41)
Alkaline Phosphatase: 131 U/L — ABNORMAL HIGH (ref 38–126)
BUN: 16 mg/dL (ref 8–23)
CHLORIDE: 100 mmol/L (ref 98–111)
CO2: 27 mmol/L (ref 22–32)
Calcium: 9 mg/dL (ref 8.9–10.3)
Creatinine: 0.73 mg/dL (ref 0.44–1.00)
GFR, Est AFR Am: >60 mL/min (ref >60)
Glucose, Bld: 142 mg/dL — ABNORMAL HIGH (ref 70–99)
POTASSIUM: 4.5 mmol/L (ref 3.5–5.1)
Sodium: 135 mmol/L (ref 135–145)
TOTAL PROTEIN: 6.7 g/dL (ref 6.5–8.1)

## 2017-10-21 MED ORDER — SODIUM CHLORIDE 0.9 % IV SOLN
220.0000 mg | Freq: Once | INTRAVENOUS | Status: AC
  Administered 2017-10-21: 220 mg via INTRAVENOUS
  Filled 2017-10-21: qty 22

## 2017-10-21 MED ORDER — SODIUM CHLORIDE 0.9 % IV SOLN
Freq: Once | INTRAVENOUS | Status: AC
  Administered 2017-10-21: 12:00:00 via INTRAVENOUS
  Filled 2017-10-21: qty 5

## 2017-10-21 MED ORDER — PALONOSETRON HCL INJECTION 0.25 MG/5ML
INTRAVENOUS | Status: AC
  Filled 2017-10-21: qty 5

## 2017-10-21 MED ORDER — SODIUM CHLORIDE 0.9 % IV SOLN
80.0000 mg/m2 | Freq: Once | INTRAVENOUS | Status: AC
  Administered 2017-10-21: 100 mg via INTRAVENOUS
  Filled 2017-10-21: qty 5

## 2017-10-21 MED ORDER — SODIUM CHLORIDE 0.9% FLUSH
10.0000 mL | Freq: Once | INTRAVENOUS | Status: AC
  Administered 2017-10-21: 10 mL
  Filled 2017-10-21: qty 10

## 2017-10-21 MED ORDER — SODIUM CHLORIDE 0.9 % IV SOLN
Freq: Once | INTRAVENOUS | Status: AC
  Administered 2017-10-21: 12:00:00 via INTRAVENOUS

## 2017-10-21 MED ORDER — SODIUM CHLORIDE 0.9% FLUSH
10.0000 mL | INTRAVENOUS | Status: DC | PRN
  Administered 2017-10-21: 10 mL
  Filled 2017-10-21: qty 10

## 2017-10-21 MED ORDER — PALONOSETRON HCL INJECTION 0.25 MG/5ML
0.2500 mg | Freq: Once | INTRAVENOUS | Status: AC
  Administered 2017-10-21: 0.25 mg via INTRAVENOUS

## 2017-10-21 MED ORDER — HEPARIN SOD (PORK) LOCK FLUSH 100 UNIT/ML IV SOLN
500.0000 [IU] | Freq: Once | INTRAVENOUS | Status: AC | PRN
  Administered 2017-10-21: 500 [IU]
  Filled 2017-10-21: qty 5

## 2017-10-21 NOTE — Telephone Encounter (Signed)
Appts already scheduled per 7/16 los.

## 2017-10-21 NOTE — Patient Instructions (Signed)
[facility] Discharge Instructions for Patients Receiving Chemotherapy  Today you received the following chemotherapy agents:  Carboplatin, Etoposide  To help prevent nausea and vomiting after your treatment, we encourage you to take your nausea medication as prescribed.   If you develop nausea and vomiting that is not controlled by your nausea medication, call the clinic.   BELOW ARE SYMPTOMS THAT SHOULD BE REPORTED IMMEDIATELY:  *FEVER GREATER THAN 100.5 F  *CHILLS WITH OR WITHOUT FEVER  NAUSEA AND VOMITING THAT IS NOT CONTROLLED WITH YOUR NAUSEA MEDICATION  *UNUSUAL SHORTNESS OF BREATH  *UNUSUAL BRUISING OR BLEEDING  TENDERNESS IN MOUTH AND THROAT WITH OR WITHOUT PRESENCE OF ULCERS  *URINARY PROBLEMS  *BOWEL PROBLEMS  UNUSUAL RASH Items with * indicate a potential emergency and should be followed up as soon as possible.  Feel free to call the clinic should you have any questions or concerns. The clinic phone number is [phone].  Please show the [facility] at check-in to the Emergency Department and triage nurse.

## 2017-10-21 NOTE — Assessment & Plan Note (Signed)
This is a very pleasant 77 year old white female recently diagnosed with extensive stage small cell lung cancer and currently undergoing systemic chemotherapy with carboplatin and etoposide.  Carboplatin was dose reduced to an AUC of 4 and etoposide was dose reduced to 80 mg/m beginning with cycle #3.  She tolerated the dose reduction much better.  Status post 4 cycles of treatment. Labs been reviewed.  Recommend that she begin cycle 5 of her treatment today as scheduled.  The patient will follow-up weekly for labs and will be seen back for follow-up visit in 3 weeks for evaluation prior to cycle 6 of her treatment.  For the malnutrition and weight loss she is followed by the dietitian at the cancer center.  The patient was advised to call immediately if she has any concerning symptoms in the interval. The patient voices understanding of current disease status and treatment options and is in agreement with the current care plan.  All questions were answered. The patient knows to call the clinic with any problems, questions or concerns. We can certainly see the patient much sooner if necessary.

## 2017-10-21 NOTE — Progress Notes (Signed)
[facility] OFFICE PROGRESS NOTE  Prince Solian, MD [place] [address]  DIAGNOSIS: Extensive stage(T2a, N2, M1c)small cell lung cancer presented withsubpleural mass within the left upper lobewithassociated ipsilateral hilar and mediastinal nodal metastasis, liver metastasis and bone metastasis  PRIOR THERAPY: None  CURRENT THERAPY: Systemic chemotherapy with carboplatin for AUC of 5 on day 1 and etoposide 100 mg/M2 on days 1, 2 and 3 with Neulasta support every 3 weeks.  Carboplatin was dose reduced to an AUC of 4 and etoposide was dose reduced to 80 mg/m beginning with cycle #3. First cycle started on 07/22/2017.  Status post 4 cycles.  INTERVAL HISTORY: Katherine Cole 77 y.o. female returns for routine follow-up visit accompanied by her daughter.  The patient reports that she is feeling fine today has no specific complaints except for ongoing fatigue.  She reports having intermittent nausea and takes her antiemetics which are somewhat effective.  She reports a decreased taste sensation.  She continues to drink boost 2-3 times a day.  She has lost a few pounds since her last visit.  The patient denies fevers and chills.  Denies chest pain, shortness of breath, cough, hemoptysis.  She has no nausea, vomiting, constipation, diarrhea today.  The patient is here for evaluation prior to cycle #5 of her treatment.  MEDICAL HISTORY: Past Medical History:  Diagnosis Date  . Arthritis    "in my fingers"   . CAD (coronary artery disease)    a. MI 1999 with 2 stents at that time followed by stenting 3-4 yrs later. b. s/p DES to LAD guided by pressure wire 03/11/12.  . Colon polyps    adenomatous  . Depression   . Diabetes mellitus, type 2 ([facility])   . Hyperlipemia   . Hypertension   . Osteoarthritis   . Pneumonia 06/2017  . Skin cancer 2012   "nose; right middle finger"  . Stroke [facility])     ALLERGIES:  is allergic to codeine and morphine and  related.  MEDICATIONS:  Current Outpatient Medications  Medication Sig Dispense Refill  . ALPRAZolam (XANAX) 0.25 MG tablet Take 0.25 mg by mouth at bedtime as needed for anxiety.    Marland Kitchen amLODipine (NORVASC) 10 MG tablet Take 1 tablet (10 mg total) by mouth daily. 30 tablet 0  . aspirin 81 MG tablet Take 1 tablet (81 mg total) by mouth daily. (Patient taking differently: Take 81 mg by mouth as directed. On mondays and Thursdays) 30 tablet 1  . atorvastatin (LIPITOR) 80 MG tablet Take 80 mg by mouth daily.    . clopidogrel (PLAVIX) 75 MG tablet Take 75 mg by mouth daily.    . fluticasone (FLONASE) 50 MCG/ACT nasal spray Place 1 spray into both nostrils daily. 16 g 2  . guaiFENesin (MUCINEX) 600 MG 12 hr tablet Take 2 tablets (1,200 mg total) by mouth 2 (two) times daily as needed (Use for one week post discharge, then twice daily as needed). (Patient taking differently: Take 1,200 mg by mouth 2 (two) times daily as needed for cough (Use for one week post discharge, then twice daily as needed). )    . HUMALOG KWIKPEN 100 UNIT/ML KiwkPen 0-8 units three times daily after meals  2  . ipratropium-albuterol (DUONEB) 0.5-2.5 (3) MG/3ML SOLN Take 3 mLs by nebulization every 6 (six) hours as needed (Shortness of breath or wheezing). 360 mL 3  . lactose free nutrition (BOOST PLUS) LIQD Take 237 mLs by mouth 3 (three) times  daily between meals. 14 Can 0  . loratadine (CLARITIN) 10 MG tablet Take 10 mg by mouth daily.    . metFORMIN (GLUCOPHAGE) 1000 MG tablet Take 1,000 mg by mouth daily.     . metoprolol succinate (TOPROL-XL) 50 MG 24 hr tablet Take 50 mg by mouth daily. Take with or immediately following a meal.    . mirtazapine (REMERON) 15 MG tablet Take 15 mg by mouth at bedtime.    . nitroGLYCERIN (NITROSTAT) 0.4 MG SL tablet Place 1 tablet (0.4 mg total) under the tongue every 5 (five) minutes as needed (up to 3 doses). For chest pain    . ondansetron (ZOFRAN ODT) 4 MG disintegrating tablet Take 1  tablet (4 mg total) by mouth every 8 (eight) hours as needed for nausea or vomiting. 20 tablet 3  . prochlorperazine (COMPAZINE) 10 MG tablet Take 1 tablet (10 mg total) by mouth every 6 (six) hours as needed for nausea or vomiting. 30 tablet 0  . promethazine (PHENERGAN) 12.5 MG tablet Take 1 tablet (12.5 mg total) by mouth every 4 (four) hours as needed for nausea or vomiting. 20 tablet 0  . sitaGLIPtin (JANUVIA) 100 MG tablet Take 0.5 tablets (50 mg total) by mouth daily. 14 tablet 0  . sodium chloride (OCEAN) 0.65 % SOLN nasal spray Place 1 spray into both nostrils as needed for congestion (use twice daily for one week post discharge, then as needed).  0   No current facility-administered medications for this visit.    Facility-Administered Medications Ordered in Other Visits  Medication Dose Route Frequency Provider Last Rate Last Dose  . CARBOplatin (PARAPLATIN) 220 mg in sodium chloride 0.9 % 250 mL chemo infusion  220 mg Intravenous Once Curt Bears, MD      . etoposide (VEPESID) 100 mg in sodium chloride 0.9 % 250 mL chemo infusion  80 mg/m2 (Treatment Plan Recorded) Intravenous Once Curt Bears, MD      . heparin lock flush 100 unit/mL  500 Units Intracatheter Once PRN Curt Bears, MD      . sodium chloride flush (NS) 0.9 % injection 10 mL  10 mL Intracatheter PRN Curt Bears, MD        SURGICAL HISTORY:  Past Surgical History:  Procedure Laterality Date  . APPENDECTOMY  ~ 1965  . CATARACT EXTRACTION    . CORONARY ANGIOPLASTY WITH STENT PLACEMENT  1999; 2004; 03/11/2012   "2 + 1 + 1; total of 4" (03/11/2012)  . FEMUR FRACTURE SURGERY  2011   RLE (03/11/2012)  . IR FLUORO GUIDE PORT INSERTION RIGHT  07/18/2017  . IR US GUIDE VASC ACCESS RIGHT  07/18/2017  . PERCUTANEOUS CORONARY STENT INTERVENTION (PCI-S) N/A 03/11/2012   Procedure: PERCUTANEOUS CORONARY STENT INTERVENTION (PCI-S);  Surgeon: Sherren Mocha, MD;  Location: [facility] CATH LAB;  Service: Cardiovascular;   Laterality: N/A;  . SKIN CANCER EXCISION  2013   "nose & right middle finger; actinic keratosis" (03/11/2012)  . TONSILLECTOMY AND ADENOIDECTOMY  ~ 1962  . TUBAL LIGATION  1977  . VIDEO BRONCHOSCOPY WITH ENDOBRONCHIAL ULTRASOUND N/A 07/04/2017   Procedure: VIDEO BRONCHOSCOPY WITH ENDOBRONCHIAL ULTRASOUND;  Surgeon: Collene Gobble, MD;  Location: [facility] OR;  Service: Thoracic;  Laterality: N/A;    REVIEW OF SYSTEMS:   Review of Systems  Constitutional: Negative for chills, fever. Positive for fatigue, decreased appetite, and weight loss. HENT:   Negative for mouth sores, nosebleeds, sore throat and trouble swallowing.   Eyes: Negative for eye problems and icterus.  Respiratory: Negative for cough, hemoptysis, shortness of breath and wheezing.   Cardiovascular: Negative for chest pain and leg swelling.  Gastrointestinal: Negative for abdominal pain, constipation, diarrhea, nausea and vomiting today. She has mild intermittent nausea since her last chemotherapy. Genitourinary: Negative for bladder incontinence, difficulty urinating, dysuria, frequency and hematuria.   Musculoskeletal: Negative for back pain, gait problem, neck pain and neck stiffness.  Skin: Negative for itching and rash.  Neurological: Negative for dizziness, extremity weakness, gait problem, headaches, light-headedness and seizures.  Hematological: Negative for adenopathy. Does not bruise/bleed easily.  Psychiatric/Behavioral: Negative for confusion, depression and sleep disturbance. The patient is not nervous/anxious.     PHYSICAL EXAMINATION:  Blood pressure (!) 153/64, pulse 62, temperature 98.1 F (36.7 C), temperature source Oral, resp. rate 17, height 5\' 1"  (1.549 m), weight 78 lb 8 oz (35.6 kg), SpO2 95 %.  ECOG PERFORMANCE STATUS: 1 - Symptomatic but completely ambulatory  Physical Exam  Constitutional: Oriented to person, place, and time. No distress.  HENT:  Head: Normocephalic and atraumatic.  Mouth/Throat:  Oropharynx is clear and moist. No oropharyngeal exudate.  Eyes: Conjunctivae are normal. Right eye exhibits no discharge. Left eye exhibits no discharge. No scleral icterus.  Neck: Normal range of motion. Neck supple.  Cardiovascular: Normal rate, regular rhythm, normal heart sounds and intact distal pulses.   Pulmonary/Chest: Effort normal and breath sounds normal. No respiratory distress. No wheezes. No rales.  Abdominal: Soft. Bowel sounds are normal. Exhibits no distension and no mass. There is no tenderness.  Musculoskeletal: Normal range of motion. Exhibits no edema.  Lymphadenopathy:    No cervical adenopathy.  Neurological: Alert and oriented to person, place, and time. Exhibits normal muscle tone. Gait normal. Coordination normal.  Skin: Skin is warm and dry. No rash noted. Not diaphoretic. No erythema. No pallor.  Psychiatric: Mood, memory and judgment normal.  Vitals reviewed.  LABORATORY DATA: Lab Results  Component Value Date   WBC 11.8 (H) 10/21/2017   HGB 9.2 (L) 10/21/2017   HCT 27.1 (L) 10/21/2017   MCV 99.6 10/21/2017   PLT 299 10/21/2017      Chemistry      Component Value Date/Time   NA 135 10/21/2017 0952   K 4.5 10/21/2017 0952   CL 100 10/21/2017 0952   CO2 27 10/21/2017 0952   BUN 16 10/21/2017 0952   CREATININE 0.73 10/21/2017 0952      Component Value Date/Time   CALCIUM 9.0 10/21/2017 0952   ALKPHOS 131 (H) 10/21/2017 0952   AST 16 10/21/2017 0952   ALT 18 10/21/2017 0952   BILITOT <0.2 (L) 10/21/2017 0952       RADIOGRAPHIC STUDIES:  Ct Chest W Contrast  Result Date: 09/25/2017 CLINICAL DATA:  Follow-up lung cancer. EXAM: CT CHEST, ABDOMEN, AND PELVIS WITH CONTRAST TECHNIQUE: Multidetector CT imaging of the chest, abdomen and pelvis was performed following the standard protocol during bolus administration of intravenous contrast. CONTRAST:  48mL ISOVUE-300 IOPAMIDOL (ISOVUE-300) INJECTION 61% COMPARISON:  CT chest 06/30/2017 and PET-CT  07/17/2017. FINDINGS: CT CHEST FINDINGS Cardiovascular: The heart size appears normal. Aortic atherosclerosis. Calcification in the RCA, LAD, and left circumflex coronary artery noted. No pericardial effusion. Mediastinum/Nodes: Small subcentimeter low-density thyroid nodules noted. The trachea appears patent and is midline. Normal appearance of the esophagus. Index left paratracheal node measures 1.3 cm, image 23/2. Decreased from 1.8 cm previously. Significant improvement in left hilar adenopathy. Index lymph node 1.1 cm, image 30/2. Previously 1.6 cm. Index left hilar lymph node measures  7 mm, image 29/2. Previously 1.7 cm. Lungs/Pleura: No pleural effusion. Tumor extending along the bronchus to the lateral segment of left upper lobe measures 2.6 x 0.7 cm, image 65/6. Previously this measured 3.0 x 1.0. The pleural mass overlying the lateral left upper lobe measures 3.1 x 1.6 cm, image 56/6. This is compared with 4.6 x 1.6 cm previously. Scattered lower lobe predominant sub solid nodules in both lungs are again noted and appear less conspicuous than on the previous exam. Favor resolving inflammation/infection. No new or progressive sites of disease within the lungs. Musculoskeletal: Spondylosis noted within the thoracic spine. No aggressive lytic or sclerotic bone lesions identified. CT ABDOMEN PELVIS FINDINGS Hepatobiliary: Segment 4B liver metastasis is no longer measurable. No new liver lesions. Gallbladder is unremarkable. No biliary dilatation. Pancreas: Unremarkable. No pancreatic ductal dilatation or surrounding inflammatory changes. Spleen: Normal in size without focal abnormality. Adrenals/Urinary Tract: Normal appearance of the adrenal glands. Small low-density structure in the posterior cortex of left kidney is unchanged and too small to characterize measuring 5 mm. No mass or hydronephrosis identified. The urinary bladder appears normal. Stomach/Bowel: Stomach is unremarkable. The small bowel loops  have a normal course and caliber without obstruction. Normal appearance of the colon. Vascular/Lymphatic: Aortic atherosclerosis. The infrarenal abdominal aorta measures 2.9 cm in maximum AP dimension. No adenopathy identified within the abdomen or pelvis. No inguinal adenopathy. Reproductive: Uterus and bilateral adnexa are unremarkable. Other: No free fluid or fluid collections. Musculoskeletal: Asymmetric sclerosis within the posterior column of the right acetabulum (where there was hypermetabolism on previous PET-CT) is identified indicating healing bone metastases. Insufficiency fracture involving the right sacral wing is suspected, image 80/4. IMPRESSION: 1. Interval response to therapy. There has been decrease in size of left mediastinal and left hilar adenopathy. The left upper lobe mass has also decreased in size. The segment 4 B liver metastasis is no longer measurable. A healing bone metastasis within the posterior column of the right acetabulum noted. 2. Suspect insufficiency fracture involving the right sacral wing. 3. Aortic atherosclerosis and 3 vessel coronary artery atherosclerotic calcifications. Aortic Atherosclerosis (ICD10-I70.0). 4. Infrarenal abdominal aortic ectasia. 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 Recommend followup by ultrasound in 5 years. This recommendation follows ACR consensus guidelines: White Paper of the ACR Incidental Findings Committee II on Vascular Findings. J Am Coll Radiol 2013; 10:789-794. Electronically Signed   By: Kerby Moors M.D.   On: 09/25/2017 15:47   Ct Abdomen Pelvis W Contrast  Result Date: 09/25/2017 CLINICAL DATA:  Follow-up lung cancer. EXAM: CT CHEST, ABDOMEN, AND PELVIS WITH CONTRAST TECHNIQUE: Multidetector CT imaging of the chest, abdomen and pelvis was performed following the standard protocol during bolus administration of intravenous contrast. CONTRAST:  31mL ISOVUE-300 IOPAMIDOL (ISOVUE-300) INJECTION 61% COMPARISON:  CT  chest 06/30/2017 and PET-CT 07/17/2017. FINDINGS: CT CHEST FINDINGS Cardiovascular: The heart size appears normal. Aortic atherosclerosis. Calcification in the RCA, LAD, and left circumflex coronary artery noted. No pericardial effusion. Mediastinum/Nodes: Small subcentimeter low-density thyroid nodules noted. The trachea appears patent and is midline. Normal appearance of the esophagus. Index left paratracheal node measures 1.3 cm, image 23/2. Decreased from 1.8 cm previously. Significant improvement in left hilar adenopathy. Index lymph node 1.1 cm, image 30/2. Previously 1.6 cm. Index left hilar lymph node measures 7 mm, image 29/2. Previously 1.7 cm. Lungs/Pleura: No pleural effusion. Tumor extending along the bronchus to the lateral segment of left upper lobe measures 2.6 x 0.7 cm, image 65/6. Previously this measured 3.0 x 1.0.  The pleural mass overlying the lateral left upper lobe measures 3.1 x 1.6 cm, image 56/6. This is compared with 4.6 x 1.6 cm previously. Scattered lower lobe predominant sub solid nodules in both lungs are again noted and appear less conspicuous than on the previous exam. Favor resolving inflammation/infection. No new or progressive sites of disease within the lungs. Musculoskeletal: Spondylosis noted within the thoracic spine. No aggressive lytic or sclerotic bone lesions identified. CT ABDOMEN PELVIS FINDINGS Hepatobiliary: Segment 4B liver metastasis is no longer measurable. No new liver lesions. Gallbladder is unremarkable. No biliary dilatation. Pancreas: Unremarkable. No pancreatic ductal dilatation or surrounding inflammatory changes. Spleen: Normal in size without focal abnormality. Adrenals/Urinary Tract: Normal appearance of the adrenal glands. Small low-density structure in the posterior cortex of left kidney is unchanged and too small to characterize measuring 5 mm. No mass or hydronephrosis identified. The urinary bladder appears normal. Stomach/Bowel: Stomach is  unremarkable. The small bowel loops have a normal course and caliber without obstruction. Normal appearance of the colon. Vascular/Lymphatic: Aortic atherosclerosis. The infrarenal abdominal aorta measures 2.9 cm in maximum AP dimension. No adenopathy identified within the abdomen or pelvis. No inguinal adenopathy. Reproductive: Uterus and bilateral adnexa are unremarkable. Other: No free fluid or fluid collections. Musculoskeletal: Asymmetric sclerosis within the posterior column of the right acetabulum (where there was hypermetabolism on previous PET-CT) is identified indicating healing bone metastases. Insufficiency fracture involving the right sacral wing is suspected, image 80/4. IMPRESSION: 1. Interval response to therapy. There has been decrease in size of left mediastinal and left hilar adenopathy. The left upper lobe mass has also decreased in size. The segment 4 B liver metastasis is no longer measurable. A healing bone metastasis within the posterior column of the right acetabulum noted. 2. Suspect insufficiency fracture involving the right sacral wing. 3. Aortic atherosclerosis and 3 vessel coronary artery atherosclerotic calcifications. Aortic Atherosclerosis (ICD10-I70.0). 4. Infrarenal abdominal aortic ectasia. 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 Recommend followup by ultrasound in 5 years. This recommendation follows ACR consensus guidelines: White Paper of the ACR Incidental Findings Committee II on Vascular Findings. J Am Coll Radiol 2013; 10:789-794. Electronically Signed   By: Kerby Moors M.D.   On: 09/25/2017 15:47     ASSESSMENT/PLAN:  Primary small cell carcinoma of left lung [facility]) This is a very pleasant 77 year old white female recently diagnosed with extensive stage small cell lung cancer and currently undergoing systemic chemotherapy with carboplatin and etoposide.  Carboplatin was dose reduced to an AUC of 4 and etoposide was dose reduced to 80 mg/m  beginning with cycle #3.  She tolerated the dose reduction much better.  Status post 4 cycles of treatment. Labs been reviewed.  Recommend that she begin cycle 5 of her treatment today as scheduled.  The patient will follow-up weekly for labs and will be seen back for follow-up visit in 3 weeks for evaluation prior to cycle 6 of her treatment.  For the malnutrition and weight loss she is followed by the dietitian at the cancer center.  The patient was advised to call immediately if she has any concerning symptoms in the interval. The patient voices understanding of current disease status and treatment options and is in agreement with the current care plan.  All questions were answered. The patient knows to call the clinic with any problems, questions or concerns. We can certainly see the patient much sooner if necessary.   No orders of the defined types were placed in this encounter.  Mikey Bussing, DNP, AGPCNP-BC, AOCNP 10/21/17

## 2017-10-22 ENCOUNTER — Inpatient Hospital Stay: Payer: Medicare Other

## 2017-10-22 ENCOUNTER — Ambulatory Visit: Payer: Medicare Other

## 2017-10-22 VITALS — BP 163/71 | HR 66 | Temp 98.4°F | Resp 17

## 2017-10-22 DIAGNOSIS — C7951 Secondary malignant neoplasm of bone: Secondary | ICD-10-CM | POA: Diagnosis not present

## 2017-10-22 DIAGNOSIS — C3412 Malignant neoplasm of upper lobe, left bronchus or lung: Secondary | ICD-10-CM | POA: Diagnosis not present

## 2017-10-22 DIAGNOSIS — C778 Secondary and unspecified malignant neoplasm of lymph nodes of multiple regions: Secondary | ICD-10-CM | POA: Diagnosis not present

## 2017-10-22 DIAGNOSIS — Z5111 Encounter for antineoplastic chemotherapy: Secondary | ICD-10-CM | POA: Diagnosis not present

## 2017-10-22 DIAGNOSIS — C787 Secondary malignant neoplasm of liver and intrahepatic bile duct: Secondary | ICD-10-CM | POA: Diagnosis not present

## 2017-10-22 DIAGNOSIS — R110 Nausea: Secondary | ICD-10-CM | POA: Diagnosis not present

## 2017-10-22 MED ORDER — SODIUM CHLORIDE 0.9 % IV SOLN
80.0000 mg/m2 | Freq: Once | INTRAVENOUS | Status: AC
  Administered 2017-10-22: 100 mg via INTRAVENOUS
  Filled 2017-10-22: qty 5

## 2017-10-22 MED ORDER — SODIUM CHLORIDE 0.9 % IV SOLN
Freq: Once | INTRAVENOUS | Status: AC
Start: 2017-10-22 — End: 2017-10-22
  Administered 2017-10-22: 14:00:00 via INTRAVENOUS

## 2017-10-22 MED ORDER — HEPARIN SOD (PORK) LOCK FLUSH 100 UNIT/ML IV SOLN
500.0000 [IU] | Freq: Once | INTRAVENOUS | Status: AC | PRN
  Administered 2017-10-22: 500 [IU]
  Filled 2017-10-22: qty 5

## 2017-10-22 MED ORDER — SODIUM CHLORIDE 0.9% FLUSH
10.0000 mL | INTRAVENOUS | Status: DC | PRN
  Administered 2017-10-22: 10 mL
  Filled 2017-10-22: qty 10

## 2017-10-22 MED ORDER — DEXAMETHASONE SODIUM PHOSPHATE 10 MG/ML IJ SOLN
10.0000 mg | Freq: Once | INTRAMUSCULAR | Status: AC
  Administered 2017-10-22: 10 mg via INTRAVENOUS

## 2017-10-22 MED ORDER — DEXAMETHASONE SODIUM PHOSPHATE 10 MG/ML IJ SOLN
INTRAMUSCULAR | Status: AC
Start: 2017-10-22 — End: ?
  Filled 2017-10-22: qty 1

## 2017-10-22 NOTE — Patient Instructions (Signed)
[place] Discharge Instructions for Patients Receiving Chemotherapy  Today you received the following chemotherapy agents: Etoposide (Vepesid)   To help prevent nausea and vomiting after your treatment, we encourage you to take your nausea medication as prescribed.    If you develop nausea and vomiting that is not controlled by your nausea medication, call the clinic.   BELOW ARE SYMPTOMS THAT SHOULD BE REPORTED IMMEDIATELY:  *FEVER GREATER THAN 100.5 F  *CHILLS WITH OR WITHOUT FEVER  NAUSEA AND VOMITING THAT IS NOT CONTROLLED WITH YOUR NAUSEA MEDICATION  *UNUSUAL SHORTNESS OF BREATH  *UNUSUAL BRUISING OR BLEEDING  TENDERNESS IN MOUTH AND THROAT WITH OR WITHOUT PRESENCE OF ULCERS  *URINARY PROBLEMS  *BOWEL PROBLEMS  UNUSUAL RASH Items with * indicate a potential emergency and should be followed up as soon as possible.  Feel free to call the clinic should you have any questions or concerns. The clinic phone number is [phone].  Please show the [place] at check-in to the Emergency Department and triage nurse.

## 2017-10-23 ENCOUNTER — Inpatient Hospital Stay: Payer: Medicare Other

## 2017-10-23 ENCOUNTER — Other Ambulatory Visit: Payer: Self-pay | Admitting: *Deleted

## 2017-10-23 VITALS — BP 150/70 | HR 76 | Temp 98.6°F | Resp 16

## 2017-10-23 DIAGNOSIS — C7951 Secondary malignant neoplasm of bone: Secondary | ICD-10-CM | POA: Diagnosis not present

## 2017-10-23 DIAGNOSIS — R110 Nausea: Secondary | ICD-10-CM | POA: Diagnosis not present

## 2017-10-23 DIAGNOSIS — C3412 Malignant neoplasm of upper lobe, left bronchus or lung: Secondary | ICD-10-CM

## 2017-10-23 DIAGNOSIS — Z5111 Encounter for antineoplastic chemotherapy: Secondary | ICD-10-CM | POA: Diagnosis not present

## 2017-10-23 DIAGNOSIS — C778 Secondary and unspecified malignant neoplasm of lymph nodes of multiple regions: Secondary | ICD-10-CM | POA: Diagnosis not present

## 2017-10-23 DIAGNOSIS — C787 Secondary malignant neoplasm of liver and intrahepatic bile duct: Secondary | ICD-10-CM | POA: Diagnosis not present

## 2017-10-23 MED ORDER — HEPARIN SOD (PORK) LOCK FLUSH 100 UNIT/ML IV SOLN
500.0000 [IU] | Freq: Once | INTRAVENOUS | Status: AC | PRN
  Administered 2017-10-23: 500 [IU]
  Filled 2017-10-23: qty 5

## 2017-10-23 MED ORDER — SODIUM CHLORIDE 0.9% FLUSH
10.0000 mL | INTRAVENOUS | Status: DC | PRN
  Administered 2017-10-23: 10 mL
  Filled 2017-10-23: qty 10

## 2017-10-23 MED ORDER — SODIUM CHLORIDE 0.9 % IV SOLN
Freq: Once | INTRAVENOUS | Status: AC
  Administered 2017-10-23: 14:00:00 via INTRAVENOUS

## 2017-10-23 MED ORDER — PEGFILGRASTIM 6 MG/0.6ML ~~LOC~~ PSKT
6.0000 mg | PREFILLED_SYRINGE | Freq: Once | SUBCUTANEOUS | Status: AC
Start: 2017-10-23 — End: 2017-10-23
  Administered 2017-10-23: 6 mg via SUBCUTANEOUS

## 2017-10-23 MED ORDER — PALONOSETRON HCL INJECTION 0.25 MG/5ML
0.2500 mg | Freq: Once | INTRAVENOUS | Status: AC
  Administered 2017-10-23: 0.25 mg via INTRAVENOUS

## 2017-10-23 MED ORDER — PROCHLORPERAZINE MALEATE 10 MG PO TABS: 10.0000 mg | ORAL_TABLET | Freq: Four times a day (QID) | ORAL | 0 refills | Status: AC | PRN

## 2017-10-23 MED ORDER — SODIUM CHLORIDE 0.9 % IV SOLN
80.0000 mg/m2 | Freq: Once | INTRAVENOUS | Status: AC
  Administered 2017-10-23: 100 mg via INTRAVENOUS
  Filled 2017-10-23: qty 5

## 2017-10-23 MED ORDER — FOSAPREPITANT DIMEGLUMINE INJECTION 150 MG
Freq: Once | INTRAVENOUS | Status: AC
  Administered 2017-10-23: 14:00:00 via INTRAVENOUS
  Filled 2017-10-23: qty 5

## 2017-10-23 MED ORDER — PEGFILGRASTIM 6 MG/0.6ML ~~LOC~~ PSKT
PREFILLED_SYRINGE | SUBCUTANEOUS | Status: AC
  Filled 2017-10-23: qty 0.6

## 2017-10-23 NOTE — Patient Instructions (Signed)
[place] Discharge Instructions for Patients Receiving Chemotherapy  Today you received the following chemotherapy agents:  Etoposide  To help prevent nausea and vomiting after your treatment, we encourage you to take your nausea medication as prescribed.   If you develop nausea and vomiting that is not controlled by your nausea medication, call the clinic.   BELOW ARE SYMPTOMS THAT SHOULD BE REPORTED IMMEDIATELY:  *FEVER GREATER THAN 100.5 F  *CHILLS WITH OR WITHOUT FEVER  NAUSEA AND VOMITING THAT IS NOT CONTROLLED WITH YOUR NAUSEA MEDICATION  *UNUSUAL SHORTNESS OF BREATH  *UNUSUAL BRUISING OR BLEEDING  TENDERNESS IN MOUTH AND THROAT WITH OR WITHOUT PRESENCE OF ULCERS  *URINARY PROBLEMS  *BOWEL PROBLEMS  UNUSUAL RASH Items with * indicate a potential emergency and should be followed up as soon as possible.  Feel free to call the clinic should you have any questions or concerns. The clinic phone number is [phone].  Please show the [place] at check-in to the Emergency Department and triage nurse.

## 2017-10-27 ENCOUNTER — Inpatient Hospital Stay: Payer: Medicare Other

## 2017-10-27 ENCOUNTER — Telehealth: Payer: Self-pay | Admitting: *Deleted

## 2017-10-27 ENCOUNTER — Other Ambulatory Visit: Payer: Self-pay | Admitting: Pulmonary Disease

## 2017-10-27 ENCOUNTER — Telehealth: Payer: Self-pay | Admitting: Oncology

## 2017-10-27 DIAGNOSIS — C3412 Malignant neoplasm of upper lobe, left bronchus or lung: Secondary | ICD-10-CM | POA: Diagnosis not present

## 2017-10-27 DIAGNOSIS — C3492 Malignant neoplasm of unspecified part of left bronchus or lung: Secondary | ICD-10-CM

## 2017-10-27 DIAGNOSIS — R110 Nausea: Secondary | ICD-10-CM | POA: Diagnosis not present

## 2017-10-27 DIAGNOSIS — Z5111 Encounter for antineoplastic chemotherapy: Secondary | ICD-10-CM | POA: Diagnosis not present

## 2017-10-27 DIAGNOSIS — C7951 Secondary malignant neoplasm of bone: Secondary | ICD-10-CM | POA: Diagnosis not present

## 2017-10-27 DIAGNOSIS — C787 Secondary malignant neoplasm of liver and intrahepatic bile duct: Secondary | ICD-10-CM | POA: Diagnosis not present

## 2017-10-27 DIAGNOSIS — Z95828 Presence of other vascular implants and grafts: Secondary | ICD-10-CM

## 2017-10-27 DIAGNOSIS — C778 Secondary and unspecified malignant neoplasm of lymph nodes of multiple regions: Secondary | ICD-10-CM | POA: Diagnosis not present

## 2017-10-27 LAB — CMP (CANCER CENTER ONLY)
ALK PHOS: 200 U/L — AB (ref 38–126)
ALT: 16 U/L (ref 0–44)
ANION GAP: 9 (ref 5–15)
AST: 15 U/L (ref 15–41)
Albumin: 3.4 g/dL — ABNORMAL LOW (ref 3.5–5.0)
BILIRUBIN TOTAL: 0.3 mg/dL (ref 0.3–1.2)
BUN: 25 mg/dL — ABNORMAL HIGH (ref 8–23)
CALCIUM: 9 mg/dL (ref 8.9–10.3)
CO2: 28 mmol/L (ref 22–32)
CREATININE: 0.8 mg/dL (ref 0.44–1.00)
Chloride: 96 mmol/L — ABNORMAL LOW (ref 98–111)
GFR, Est AFR Am: >60 mL/min (ref >60)
GFR, Estimated: >60 mL/min (ref >60)
GLUCOSE: 301 mg/dL — AB (ref 70–99)
Potassium: 4.5 mmol/L (ref 3.5–5.1)
Sodium: 133 mmol/L — ABNORMAL LOW (ref 135–145)
Total Protein: 6.5 g/dL (ref 6.5–8.1)

## 2017-10-27 LAB — CBC WITH DIFFERENTIAL (CANCER CENTER ONLY)
BASOS PCT: 0 %
Basophils Absolute: 0.2 10*3/uL — ABNORMAL HIGH (ref 0.0–0.1)
EOS ABS: 0.1 10*3/uL (ref 0.0–0.5)
Eosinophils Relative: 0 %
HEMATOCRIT: 25.9 % — AB (ref 34.8–46.6)
Hemoglobin: 8.7 g/dL — ABNORMAL LOW (ref 11.6–15.9)
Lymphocytes Relative: 4 %
Lymphs Abs: 2 10*3/uL (ref 0.9–3.3)
MCH: 33.6 pg (ref 25.1–34.0)
MCHC: 33.4 g/dL (ref 31.5–36.0)
MCV: 100.7 fL (ref 79.5–101.0)
MONO ABS: 0.4 10*3/uL (ref 0.1–0.9)
Monocytes Relative: 1 %
NEUTROS ABS: 50.7 10*3/uL — AB (ref 1.5–6.5)
NEUTROS PCT: 95 %
Platelet Count: 276 10*3/uL (ref 145–400)
RBC: 2.57 MIL/uL — ABNORMAL LOW (ref 3.70–5.45)
RDW: 20.2 % — AB (ref 11.2–14.5)
WBC Count: 53.3 10*3/uL (ref 3.9–10.3)

## 2017-10-27 MED ORDER — SODIUM CHLORIDE 0.9% FLUSH
10.0000 mL | Freq: Once | INTRAVENOUS | Status: AC
  Administered 2017-10-27: 10 mL
  Filled 2017-10-27: qty 10

## 2017-10-27 MED ORDER — HEPARIN SOD (PORK) LOCK FLUSH 100 UNIT/ML IV SOLN
500.0000 [IU] | Freq: Once | INTRAVENOUS | Status: AC
  Administered 2017-10-27: 500 [IU]
  Filled 2017-10-27: qty 5

## 2017-10-27 NOTE — Telephone Encounter (Signed)
Tried to reach patient I did leave a message

## 2017-10-27 NOTE — Telephone Encounter (Signed)
Critical Elevated WBC and Neutrophil counts were noted from labs drawn today.  Per Mikey Bussing, NP called patient.  She denies having any fever, or pain.  She does report feeling very fatigued and reports having nausea.  Scheduling message sent to [facility] to have her seen tomorrow.  Patient is aware if she should develop a fever over night then she should go to the emergency room.  She validates she has a thermometer at home to monitor with.  No further questions at this point.

## 2017-10-28 ENCOUNTER — Ambulatory Visit (HOSPITAL_COMMUNITY)
Admission: RE | Admit: 2017-10-28 | Discharge: 2017-10-28 | Disposition: A | Discharge status: Home / Self Care | Payer: Medicare Other | Source: Ambulatory Visit | Attending: Medical | Admitting: Medical

## 2017-10-28 ENCOUNTER — Inpatient Hospital Stay (HOSPITAL_BASED_OUTPATIENT_CLINIC_OR_DEPARTMENT_OTHER): Payer: Medicare Other | Admitting: Medical

## 2017-10-28 ENCOUNTER — Telehealth: Payer: Self-pay | Admitting: Medical Oncology

## 2017-10-28 VITALS — BP 146/67 | HR 71 | Temp 98.6°F | Resp 18 | Ht 61.0 in | Wt 78.0 lb

## 2017-10-28 DIAGNOSIS — C3412 Malignant neoplasm of upper lobe, left bronchus or lung: Secondary | ICD-10-CM | POA: Diagnosis not present

## 2017-10-28 DIAGNOSIS — Z95828 Presence of other vascular implants and grafts: Secondary | ICD-10-CM

## 2017-10-28 DIAGNOSIS — R110 Nausea: Secondary | ICD-10-CM | POA: Diagnosis not present

## 2017-10-28 DIAGNOSIS — E860 Dehydration: Secondary | ICD-10-CM | POA: Diagnosis not present

## 2017-10-28 DIAGNOSIS — C3492 Malignant neoplasm of unspecified part of left bronchus or lung: Secondary | ICD-10-CM

## 2017-10-28 DIAGNOSIS — C778 Secondary and unspecified malignant neoplasm of lymph nodes of multiple regions: Secondary | ICD-10-CM | POA: Diagnosis not present

## 2017-10-28 DIAGNOSIS — D72829 Elevated white blood cell count, unspecified: Secondary | ICD-10-CM

## 2017-10-28 DIAGNOSIS — C7951 Secondary malignant neoplasm of bone: Secondary | ICD-10-CM | POA: Diagnosis not present

## 2017-10-28 DIAGNOSIS — Z720 Tobacco use: Secondary | ICD-10-CM

## 2017-10-28 DIAGNOSIS — C787 Secondary malignant neoplasm of liver and intrahepatic bile duct: Secondary | ICD-10-CM | POA: Diagnosis not present

## 2017-10-28 DIAGNOSIS — D72823 Leukemoid reaction: Secondary | ICD-10-CM

## 2017-10-28 DIAGNOSIS — I700 Atherosclerosis of aorta: Secondary | ICD-10-CM | POA: Diagnosis not present

## 2017-10-28 DIAGNOSIS — Z5111 Encounter for antineoplastic chemotherapy: Secondary | ICD-10-CM | POA: Diagnosis not present

## 2017-10-28 MED ORDER — SODIUM CHLORIDE 0.9% FLUSH
10.0000 mL | Freq: Once | INTRAVENOUS | Status: AC
  Administered 2017-10-28: 10 mL
  Filled 2017-10-28: qty 10

## 2017-10-28 MED ORDER — SODIUM CHLORIDE 0.9 % IV SOLN
Freq: Once | INTRAVENOUS | Status: AC
  Administered 2017-10-28: 12:00:00 via INTRAVENOUS

## 2017-10-28 MED ORDER — HEPARIN SOD (PORK) LOCK FLUSH 100 UNIT/ML IV SOLN
500.0000 [IU] | Freq: Once | INTRAVENOUS | Status: AC
  Administered 2017-10-28: 500 [IU]
  Filled 2017-10-28: qty 5

## 2017-10-28 NOTE — Patient Instructions (Signed)
Dehydration, Adult Dehydration is when there is not enough fluid or water in your body. This happens when you lose more fluids than you take in. Dehydration can range from mild to very bad. It should be treated right away to keep it from getting very bad. Symptoms of mild dehydration may include:  Thirst.  Dry lips.  Slightly dry mouth.  Dry, warm skin.  Dizziness. Symptoms of moderate dehydration may include:  Very dry mouth.  Muscle cramps.  Dark pee (urine). Pee may be the color of tea.  Your body making less pee.  Your eyes making fewer tears.  Heartbeat that is uneven or faster than normal (palpitations).  Headache.  Light-headedness, especially when you stand up from sitting.  Fainting (syncope). Symptoms of very bad dehydration may include:  Changes in skin, such as: ? Cold and clammy skin. ? Blotchy (mottled) or pale skin. ? Skin that does not quickly return to normal after being lightly pinched and let go (poor skin turgor).  Changes in body fluids, such as: ? Feeling very thirsty. ? Your eyes making fewer tears. ? Not sweating when body temperature is high, such as in hot weather. ? Your body making very little pee.  Changes in vital signs, such as: ? Weak pulse. ? Pulse that is more than 100 beats a minute when you are sitting still. ? Fast breathing. ? Low blood pressure.  Other changes, such as: ? Sunken eyes. ? Cold hands and feet. ? Confusion. ? Lack of energy (lethargy). ? Trouble waking up from sleep. ? Short-term weight loss. ? Unconsciousness. Follow these instructions at home:  If told by your doctor, drink an ORS: ? Make an ORS by using instructions on the package. ? Start by drinking small amounts, about  cup (120 mL) every 5-10 minutes. ? Slowly drink more until you have had the amount that your doctor said to have.  Drink enough clear fluid to keep your pee clear or pale yellow. If you were told to drink an ORS, finish the ORS  first, then start slowly drinking clear fluids. Drink fluids such as: ? Water. Do not drink only water by itself. Doing that can make the salt (sodium) level in your body get too low (hyponatremia). ? Ice chips. ? Fruit juice that you have added water to (diluted). ? Low-calorie sports drinks.  Avoid: ? Alcohol. ? Drinks that have a lot of sugar. These include high-calorie sports drinks, fruit juice that does not have water added, and soda. ? Caffeine. ? Foods that are greasy or have a lot of fat or sugar.  Take over-the-counter and prescription medicines only as told by your doctor.  Do not take salt tablets. Doing that can make the salt level in your body get too high (hypernatremia).  Eat foods that have minerals (electrolytes). Examples include bananas, oranges, potatoes, tomatoes, and spinach.  Keep all follow-up visits as told by your doctor. This is important. Contact a doctor if:  You have belly (abdominal) pain that: ? Gets worse. ? Stays in one area (localizes).  You have a rash.  You have a stiff neck.  You get angry or annoyed more easily than normal (irritability).  You are more sleepy than normal.  You have a harder time waking up than normal.  You feel: ? Weak. ? Dizzy. ? Very thirsty.  You have peed (urinated) only a small amount of very dark pee during 6-8 hours. Get help right away if:  You have symptoms of   very bad dehydration.  You cannot drink fluids without throwing up (vomiting).  Your symptoms get worse with treatment.  You have a fever.  You have a very bad headache.  You are throwing up or having watery poop (diarrhea) and it: ? Gets worse. ? Does not go away.  You have blood or something green (bile) in your throw-up.  You have blood in your poop (stool). This may cause poop to look black and tarry.  You have not peed in 6-8 hours.  You pass out (faint).  Your heart rate when you are sitting still is more than 100 beats a  minute.  You have trouble breathing. This information is not intended to replace advice given to you by your health care provider. Make sure you discuss any questions you have with your health care provider. Document Released: 01/19/2009 Document Revised: 10/13/2015 Document Reviewed: 05/19/2015 Elsevier Interactive Patient Education  2018 [employer]  

## 2017-10-28 NOTE — Telephone Encounter (Signed)
Pt dtr confirmed pt is coming in today.

## 2017-10-29 NOTE — Progress Notes (Signed)
These results were called to Katherine Cole's daughter. A message was left for her. She was told to call if there were questions.  Sandi Mealy, MHS, PA-C

## 2017-10-30 NOTE — Progress Notes (Signed)
[facility] Progress Note   Katherine Cole [id number] 23-Oct-1940 77 y.o.  Katherine Cole is managed by Dr. Fanny Bien. Mohamed  Actively treated with chemotherapy/immunotherapy: yes  Current Therapy: Carboplatin and etoposide  Last Treated:  10/21/2017 (cycle 5)  Assessment: Plan:    Dehydration - Plan: 0.9 %  sodium chloride infusion  Primary malignant neoplasm of left upper lobe of lung ([place]) - Plan: DG Chest 2 View  Port-A-Cath in place - Plan: heparin lock flush 100 unit/mL, sodium chloride flush (NS) 0.9 % injection 10 mL  Primary small cell carcinoma of left lung ([facility]) - Plan: heparin lock flush 100 unit/mL, sodium chloride flush (NS) 0.9 % injection 10 mL   Dehydration: The patient was given 1 L of normal saline IV.  Leukocytosis: The patient's leukocytosis is likely related to her recent dosing with Neulasta.  The patient was however referred for a chest x-ray with results as follows: Stable lateral left upper lobe mass is noted consistent with malignancy. Possible new right suprahilar mass is noted, although this may represent artifact due to overlying Port-A-Cath. CT scan may be performed for further evaluation.  Extensive stage small cell lung cancer: The patient is status post cycle 5 of carboplatin and etoposide with Neulasta support.  She is scheduled to be seen in follow-up on 11/10/2017.  Please see After Visit Summary for patient specific instructions.  Future Appointments  Date Time Provider [place]  11/03/2017  3:15 PM [facility] LAB 4 [facility] None  11/03/2017  3:45 PM [facility] [place] FLUSH [facility] None  11/10/2017  9:30 AM [facility] LAB 4 [facility] None  11/10/2017  9:45 AM [facility] [place] FLUSH [facility] None  11/10/2017 10:00 AM Curcio, Kristin R, NP [facility] None  11/10/2017 11:00 AM [facility] INFUSION [facility] None  11/11/2017  1:45 PM [facility] INFUSION [facility] None  11/11/2017  3:15 PM Jennet Maduro, RD [facility] None   11/12/2017  2:00 PM [facility] INFUSION [facility] None    Orders Placed This Encounter  Procedures  . DG Chest 2 View       Subjective:   Patient ID:  Katherine Cole is a 77 y.o. (DOB Jun 25, 1940) female.  Chief Complaint:  Chief Complaint  Patient presents with  . Fatigue    HPI Katherine Cole is a 77 year old woman with a history of an extensive stage small cell lung cancer who is managed by Dr. Fanny Bien. Mohamed and is status post cycle 5 of carboplatin and etoposide given with Neulasta support.  She was last treated on 10/21/2017.  She presents to the office today with her daughter.  She had labs drawn yesterday which showed an elevated white blood count of 53.3 with an ANC of 50.7.  She was dosed with Neulasta on 10/23/2017.  The patient reports that she has had increasing fatigue, weakness, and mild nausea.  She is having anorexia and is drinking very little.  She denies fevers, chills, sweats, chest pain, shortness of breath, vomiting, constipation, or diarrhea.  Medications: I have reviewed the patient's current medications.  Allergies:  Allergies  Allergen Reactions  . Codeine Other (See Comments)    "gives me a migraine" (03/11/2012)  . Morphine And Related Other (See Comments)    "gives me a migraine" (03/11/2012)    Past Medical History:  Diagnosis Date  . Arthritis    "in my fingers"   . CAD (coronary artery disease)    a. MI 1999 with 2 stents at that time followed by stenting  3-4 yrs later. b. s/p DES to LAD guided by pressure wire 03/11/12.  . Colon polyps    adenomatous  . Depression   . Diabetes mellitus, type 2 ([place])   . Hyperlipemia   . Hypertension   . Osteoarthritis   . Pneumonia 06/2017  . Skin cancer 2012   "nose; right middle finger"  . Stroke [facility])     Past Surgical History:  Procedure Laterality Date  . APPENDECTOMY  ~ 1965  . CATARACT EXTRACTION    . CORONARY ANGIOPLASTY WITH STENT PLACEMENT  1999; 2004; 03/11/2012   "2 + 1 + 1; total  of 4" (03/11/2012)  . FEMUR FRACTURE SURGERY  2011   RLE (03/11/2012)  . IR FLUORO GUIDE PORT INSERTION RIGHT  07/18/2017  . IR US GUIDE VASC ACCESS RIGHT  07/18/2017  . PERCUTANEOUS CORONARY STENT INTERVENTION (PCI-S) N/A 03/11/2012   Procedure: PERCUTANEOUS CORONARY STENT INTERVENTION (PCI-S);  Surgeon: Sherren Mocha, MD;  Location: [facility] CATH LAB;  Service: Cardiovascular;  Laterality: N/A;  . SKIN CANCER EXCISION  2013   "nose & right middle finger; actinic keratosis" (03/11/2012)  . TONSILLECTOMY AND ADENOIDECTOMY  ~ 1962  . TUBAL LIGATION  1977  . VIDEO BRONCHOSCOPY WITH ENDOBRONCHIAL ULTRASOUND N/A 07/04/2017   Procedure: VIDEO BRONCHOSCOPY WITH ENDOBRONCHIAL ULTRASOUND;  Surgeon: Collene Gobble, MD;  Location: [facility] OR;  Service: Thoracic;  Laterality: N/A;    Family History  Problem Relation Age of Onset  . Liver cancer Mother   . Brain cancer Father   . Stomach cancer Father   . Colon cancer Brother 96  . Lung cancer Sister   . Lung cancer Brother   . Stomach cancer Brother   . Diabetes Daughter     Social History   Socioeconomic History  . Marital status: Widowed    Spouse name: Not on file  . Number of children: 3  . Years of education: Not on file  . Highest education level: Not on file  Occupational History  . Occupation: Retired  Scientific laboratory technician  . Financial resource strain: Not on file  . Food insecurity:    Worry: Not on file    Inability: Not on file  . Transportation needs:    Medical: Not on file    Non-medical: Not on file  Tobacco Use  . Smoking status: Current Every Day Smoker    Packs/day: 0.50    Years: 51.00    Pack years: 25.50    Types: Cigarettes  . Smokeless tobacco: Never Used  Substance and Sexual Activity  . Alcohol use: No  . Drug use: No  . Sexual activity: Never  Lifestyle  . Physical activity:    Days per week: Not on file    Minutes per session: Not on file  . Stress: Not on file  Relationships  . Social connections:    Talks on  phone: Not on file    Gets together: Not on file    Attends religious service: Not on file    Active member of club or organization: Not on file    Attends meetings of clubs or organizations: Not on file    Relationship status: Not on file  . Intimate partner violence:    Fear of current or ex partner: Not on file    Emotionally abused: Not on file    Physically abused: Not on file    Forced sexual activity: Not on file  Other Topics Concern  . Not on file  Social  History Narrative   Grandson lives with her.      Past Medical History, Surgical history, Social history, and Family history were reviewed and updated as appropriate.   Please see review of systems for further details on the patient's review from today.   Review of Systems:  Review of Systems  Constitutional: Positive for appetite change and fatigue. Negative for chills, diaphoresis and fever.  Respiratory: Negative for cough, choking, shortness of breath and wheezing.   Cardiovascular: Negative for chest pain and palpitations.  Gastrointestinal: Positive for nausea. Negative for constipation, diarrhea and vomiting.  Genitourinary: Negative for decreased urine volume.  Neurological: Positive for weakness. Negative for headaches.    Objective:   Physical Exam:  BP (!) 146/67 (BP Location: Left Arm, Patient Position: Sitting)   Pulse 71   Temp 98.6 F (37 C) (Oral)   Resp 18   Ht 5\' 1"  (1.549 m)   Wt 78 lb (35.4 kg)   SpO2 100%   BMI 14.74 kg/m  ECOG: 1  Physical Exam  Constitutional: No distress.  The patient is an elderly female who appears to be chronically ill but in no acute distress.  HENT:  Head: Normocephalic and atraumatic.  Mouth/Throat: Oropharynx is clear and moist.  Cardiovascular: Normal rate, regular rhythm and normal heart sounds. Exam reveals no gallop and no friction rub.  No murmur heard. Pulmonary/Chest: Effort normal. No stridor. No respiratory distress. She has no wheezes. She has no  rales.  Increased AP diameter.  Breath sounds are decreased throughout all lung fields.  Abdominal: Soft. Bowel sounds are normal. She exhibits no distension and no mass. There is no tenderness. There is no rebound and no guarding.  Musculoskeletal: She exhibits no edema.  Neurological: She is alert. Coordination normal.  Skin: Skin is warm and dry. She is not diaphoretic.  Psychiatric: She has a normal mood and affect. Her behavior is normal. Judgment and thought content normal.    Lab Review:     Component Value Date/Time   NA 133 (L) 10/27/2017 1237   K 4.5 10/27/2017 1237   CL 96 (L) 10/27/2017 1237   CO2 28 10/27/2017 1237   GLUCOSE 301 (H) 10/27/2017 1237   BUN 25 (H) 10/27/2017 1237   CREATININE 0.80 10/27/2017 1237   CALCIUM 9.0 10/27/2017 1237   PROT 6.5 10/27/2017 1237   ALBUMIN 3.4 (L) 10/27/2017 1237   AST 15 10/27/2017 1237   ALT 16 10/27/2017 1237   ALKPHOS 200 (H) 10/27/2017 1237   BILITOT 0.3 10/27/2017 1237   GFRNONAA >60 10/27/2017 1237   GFRAA >60 10/27/2017 1237       Component Value Date/Time   WBC 53.3 (HH) 10/27/2017 1237   WBC 76.2 (HH) 09/12/2017 1744   RBC 2.57 (L) 10/27/2017 1237   HGB 8.7 (L) 10/27/2017 1237   HCT 25.9 (L) 10/27/2017 1237   HCT 27.3 (L) 08/04/2017 1704   PLT 276 10/27/2017 1237   MCV 100.7 10/27/2017 1237   MCH 33.6 10/27/2017 1237   MCHC 33.4 10/27/2017 1237   RDW 20.2 (H) 10/27/2017 1237   LYMPHSABS 2.0 10/27/2017 1237   MONOABS 0.4 10/27/2017 1237   EOSABS 0.1 10/27/2017 1237   BASOSABS 0.2 (H) 10/27/2017 1237   -------------------------------  Imaging from last 24 hours (if applicable):  Radiology interpretation: Dg Chest 2 View  Result Date: 10/28/2017 CLINICAL DATA:  Left upper lobe malignancy. EXAM: CHEST - 2 VIEW COMPARISON:  CT scan of September 25, 2017.  Radiograph of September 12, 2017. FINDINGS: Stable cardiac size. Atherosclerosis of thoracic aorta is noted. Right internal jugular Port-A-Cath is noted with distal  tip in expected position of the SVC. No pneumothorax or pleural effusion is noted. Stable appearance of mass seen laterally in the left upper lobe consistent with malignancy. Possible new right suprahilar density is noted. Bony thorax is unremarkable. IMPRESSION: Stable lateral left upper lobe mass is noted consistent with malignancy. Possible new right suprahilar mass is noted, although this may represent artifact due to overlying Port-A-Cath. CT scan may be performed for further evaluation. Aortic Atherosclerosis (ICD10-I70.0). Electronically Signed   By: Marijo Conception, M.D.   On: 10/28/2017 15:02        This case was discussed with Dr. Julien Nordmann. He expressed agreement with my management of this patient.

## 2017-11-03 ENCOUNTER — Inpatient Hospital Stay: Payer: Medicare Other

## 2017-11-03 DIAGNOSIS — C3492 Malignant neoplasm of unspecified part of left bronchus or lung: Secondary | ICD-10-CM

## 2017-11-03 DIAGNOSIS — C7951 Secondary malignant neoplasm of bone: Secondary | ICD-10-CM | POA: Diagnosis not present

## 2017-11-03 DIAGNOSIS — C778 Secondary and unspecified malignant neoplasm of lymph nodes of multiple regions: Secondary | ICD-10-CM | POA: Diagnosis not present

## 2017-11-03 DIAGNOSIS — Z5111 Encounter for antineoplastic chemotherapy: Secondary | ICD-10-CM | POA: Diagnosis not present

## 2017-11-03 DIAGNOSIS — R110 Nausea: Secondary | ICD-10-CM | POA: Diagnosis not present

## 2017-11-03 DIAGNOSIS — Z95828 Presence of other vascular implants and grafts: Secondary | ICD-10-CM

## 2017-11-03 DIAGNOSIS — C3412 Malignant neoplasm of upper lobe, left bronchus or lung: Secondary | ICD-10-CM | POA: Diagnosis not present

## 2017-11-03 DIAGNOSIS — C787 Secondary malignant neoplasm of liver and intrahepatic bile duct: Secondary | ICD-10-CM | POA: Diagnosis not present

## 2017-11-03 LAB — CBC WITH DIFFERENTIAL (CANCER CENTER ONLY)
BASOS ABS: 0 10*3/uL (ref 0.0–0.1)
BASOS PCT: 0 %
Eosinophils Absolute: 0 10*3/uL (ref 0.0–0.5)
Eosinophils Relative: 0 %
HCT: 24.2 % — ABNORMAL LOW (ref 34.8–46.6)
Hemoglobin: 8.2 g/dL — ABNORMAL LOW (ref 11.6–15.9)
Lymphocytes Relative: 16 %
Lymphs Abs: 2.3 10*3/uL (ref 0.9–3.3)
MCH: 34.2 pg — ABNORMAL HIGH (ref 25.1–34.0)
MCHC: 33.9 g/dL (ref 31.5–36.0)
MCV: 100.8 fL (ref 79.5–101.0)
MONO ABS: 1.2 10*3/uL — AB (ref 0.1–0.9)
MONOS PCT: 8 %
Neutro Abs: 10.9 10*3/uL — ABNORMAL HIGH (ref 1.5–6.5)
Neutrophils Relative %: 76 %
PLATELETS: 119 10*3/uL — AB (ref 145–400)
RBC: 2.4 MIL/uL — ABNORMAL LOW (ref 3.70–5.45)
RDW: 17.9 % — ABNORMAL HIGH (ref 11.2–14.5)
WBC Count: 14.5 10*3/uL — ABNORMAL HIGH (ref 3.9–10.3)

## 2017-11-03 LAB — CMP (CANCER CENTER ONLY)
ALBUMIN: 3.5 g/dL (ref 3.5–5.0)
ALK PHOS: 167 U/L — AB (ref 38–126)
ALT: 15 U/L (ref 0–44)
AST: 14 U/L — AB (ref 15–41)
Anion gap: 8 (ref 5–15)
BUN: 14 mg/dL (ref 8–23)
CALCIUM: 9.2 mg/dL (ref 8.9–10.3)
CO2: 29 mmol/L (ref 22–32)
Chloride: 92 mmol/L — ABNORMAL LOW (ref 98–111)
Creatinine: 0.83 mg/dL (ref 0.44–1.00)
GFR, Est AFR Am: >60 mL/min (ref >60)
GFR, Estimated: >60 mL/min (ref >60)
GLUCOSE: 267 mg/dL — AB (ref 70–99)
Potassium: 4.2 mmol/L (ref 3.5–5.1)
Sodium: 129 mmol/L — ABNORMAL LOW (ref 135–145)
TOTAL PROTEIN: 6.6 g/dL (ref 6.5–8.1)

## 2017-11-03 MED ORDER — HEPARIN SOD (PORK) LOCK FLUSH 100 UNIT/ML IV SOLN
500.0000 [IU] | Freq: Once | INTRAVENOUS | Status: AC
  Administered 2017-11-03: 500 [IU]
  Filled 2017-11-03: qty 5

## 2017-11-03 MED ORDER — SODIUM CHLORIDE 0.9% FLUSH
10.0000 mL | Freq: Once | INTRAVENOUS | Status: AC
  Administered 2017-11-03: 10 mL
  Filled 2017-11-03: qty 10

## 2017-11-10 ENCOUNTER — Inpatient Hospital Stay: Payer: Medicare Other

## 2017-11-10 ENCOUNTER — Other Ambulatory Visit: Payer: Medicare Other

## 2017-11-10 ENCOUNTER — Telehealth: Payer: Self-pay | Admitting: Internal Medicine

## 2017-11-10 ENCOUNTER — Inpatient Hospital Stay: Payer: Medicare Other | Attending: Internal Medicine

## 2017-11-10 ENCOUNTER — Encounter: Payer: Self-pay | Admitting: Oncology

## 2017-11-10 ENCOUNTER — Ambulatory Visit: Payer: Medicare Other | Admitting: Internal Medicine

## 2017-11-10 ENCOUNTER — Ambulatory Visit: Payer: Medicare Other

## 2017-11-10 ENCOUNTER — Inpatient Hospital Stay (HOSPITAL_BASED_OUTPATIENT_CLINIC_OR_DEPARTMENT_OTHER): Payer: Medicare Other | Admitting: Oncology

## 2017-11-10 VITALS — BP 177/65 | HR 70 | Temp 97.6°F | Resp 17 | Ht 61.0 in | Wt 79.8 lb

## 2017-11-10 DIAGNOSIS — D649 Anemia, unspecified: Secondary | ICD-10-CM | POA: Diagnosis not present

## 2017-11-10 DIAGNOSIS — C787 Secondary malignant neoplasm of liver and intrahepatic bile duct: Secondary | ICD-10-CM

## 2017-11-10 DIAGNOSIS — I10 Essential (primary) hypertension: Secondary | ICD-10-CM | POA: Insufficient documentation

## 2017-11-10 DIAGNOSIS — C3492 Malignant neoplasm of unspecified part of left bronchus or lung: Secondary | ICD-10-CM

## 2017-11-10 DIAGNOSIS — C7951 Secondary malignant neoplasm of bone: Secondary | ICD-10-CM | POA: Insufficient documentation

## 2017-11-10 DIAGNOSIS — Z5111 Encounter for antineoplastic chemotherapy: Secondary | ICD-10-CM

## 2017-11-10 DIAGNOSIS — C3412 Malignant neoplasm of upper lobe, left bronchus or lung: Secondary | ICD-10-CM

## 2017-11-10 DIAGNOSIS — E46 Unspecified protein-calorie malnutrition: Secondary | ICD-10-CM

## 2017-11-10 DIAGNOSIS — R0609 Other forms of dyspnea: Secondary | ICD-10-CM

## 2017-11-10 DIAGNOSIS — R634 Abnormal weight loss: Secondary | ICD-10-CM | POA: Insufficient documentation

## 2017-11-10 DIAGNOSIS — Z95828 Presence of other vascular implants and grafts: Secondary | ICD-10-CM

## 2017-11-10 LAB — CBC WITH DIFFERENTIAL (CANCER CENTER ONLY)
BASOS ABS: 0.1 10*3/uL (ref 0.0–0.1)
Basophils Relative: 0 %
Eosinophils Absolute: 0 10*3/uL (ref 0.0–0.5)
Eosinophils Relative: 0 %
HEMATOCRIT: 28.1 % — AB (ref 34.8–46.6)
Hemoglobin: 9.3 g/dL — ABNORMAL LOW (ref 11.6–15.9)
LYMPHS PCT: 22 %
Lymphs Abs: 2.4 10*3/uL (ref 0.9–3.3)
MCH: 34.3 pg — ABNORMAL HIGH (ref 25.1–34.0)
MCHC: 33.1 g/dL (ref 31.5–36.0)
MCV: 103.7 fL — AB (ref 79.5–101.0)
Monocytes Absolute: 1.1 10*3/uL — ABNORMAL HIGH (ref 0.1–0.9)
Monocytes Relative: 9 %
NEUTROS ABS: 7.6 10*3/uL — AB (ref 1.5–6.5)
NEUTROS PCT: 69 %
PLATELETS: 187 10*3/uL (ref 145–400)
RBC: 2.71 MIL/uL — AB (ref 3.70–5.45)
RDW: 18.4 % — ABNORMAL HIGH (ref 11.2–14.5)
WBC: 11.2 10*3/uL — AB (ref 3.9–10.3)

## 2017-11-10 LAB — CMP (CANCER CENTER ONLY)
ALT: 14 U/L (ref 0–44)
ANION GAP: 8 (ref 5–15)
AST: 13 U/L — ABNORMAL LOW (ref 15–41)
Albumin: 3.6 g/dL (ref 3.5–5.0)
Alkaline Phosphatase: 142 U/L — ABNORMAL HIGH (ref 38–126)
BILIRUBIN TOTAL: 0.2 mg/dL — AB (ref 0.3–1.2)
BUN: 19 mg/dL (ref 8–23)
CO2: 23 mmol/L (ref 22–32)
Calcium: 9.2 mg/dL (ref 8.9–10.3)
Chloride: 101 mmol/L (ref 98–111)
Creatinine: 0.76 mg/dL (ref 0.44–1.00)
GFR, Est AFR Am: >60 mL/min (ref >60)
Glucose, Bld: 157 mg/dL — ABNORMAL HIGH (ref 70–99)
POTASSIUM: 4.5 mmol/L (ref 3.5–5.1)
Sodium: 132 mmol/L — ABNORMAL LOW (ref 135–145)
TOTAL PROTEIN: 7 g/dL (ref 6.5–8.1)

## 2017-11-10 MED ORDER — HEPARIN SOD (PORK) LOCK FLUSH 100 UNIT/ML IV SOLN
500.0000 [IU] | Freq: Once | INTRAVENOUS | Status: AC | PRN
  Administered 2017-11-10: 500 [IU]
  Filled 2017-11-10: qty 5

## 2017-11-10 MED ORDER — FOSAPREPITANT DIMEGLUMINE INJECTION 150 MG
Freq: Once | INTRAVENOUS | Status: AC
  Administered 2017-11-10: 12:00:00 via INTRAVENOUS
  Filled 2017-11-10: qty 5

## 2017-11-10 MED ORDER — SODIUM CHLORIDE 0.9% FLUSH
10.0000 mL | INTRAVENOUS | Status: DC | PRN
  Administered 2017-11-10: 10 mL
  Filled 2017-11-10: qty 10

## 2017-11-10 MED ORDER — SODIUM CHLORIDE 0.9 % IV SOLN
220.0000 mg | Freq: Once | INTRAVENOUS | Status: AC
  Administered 2017-11-10: 220 mg via INTRAVENOUS
  Filled 2017-11-10: qty 22

## 2017-11-10 MED ORDER — SODIUM CHLORIDE 0.9 % IV SOLN
80.0000 mg/m2 | Freq: Once | INTRAVENOUS | Status: AC
  Administered 2017-11-10: 100 mg via INTRAVENOUS
  Filled 2017-11-10: qty 5

## 2017-11-10 MED ORDER — PALONOSETRON HCL INJECTION 0.25 MG/5ML
INTRAVENOUS | Status: AC
  Filled 2017-11-10: qty 5

## 2017-11-10 MED ORDER — SODIUM CHLORIDE 0.9% FLUSH
10.0000 mL | Freq: Once | INTRAVENOUS | Status: AC
  Administered 2017-11-10: 10 mL
  Filled 2017-11-10: qty 10

## 2017-11-10 MED ORDER — PALONOSETRON HCL INJECTION 0.25 MG/5ML
0.2500 mg | Freq: Once | INTRAVENOUS | Status: AC
  Administered 2017-11-10: 0.25 mg via INTRAVENOUS

## 2017-11-10 MED ORDER — SODIUM CHLORIDE 0.9 % IV SOLN
Freq: Once | INTRAVENOUS | Status: AC
  Administered 2017-11-10: 11:00:00 via INTRAVENOUS
  Filled 2017-11-10: qty 250

## 2017-11-10 NOTE — Telephone Encounter (Signed)
Appointments scheduled AVS/Calendar printed/ contrast provided with instructions and phone number for central scheduling per 8/5 los

## 2017-11-10 NOTE — Patient Instructions (Signed)
Implanted Port Home Guide An implanted port is a type of central line that is placed under the skin. Central lines are used to provide IV access when treatment or nutrition needs to be given through a person's veins. Implanted ports are used for long-term IV access. An implanted port may be placed because:  You need IV medicine that would be irritating to the small veins in your hands or arms.  You need long-term IV medicines, such as antibiotics.  You need IV nutrition for a long period.  You need frequent blood draws for lab tests.  You need dialysis.  Implanted ports are usually placed in the chest area, but they can also be placed in the upper arm, the abdomen, or the leg. An implanted port has two main parts:  Reservoir. The reservoir is round and will appear as a small, raised area under your skin. The reservoir is the part where a needle is inserted to give medicines or draw blood.  Catheter. The catheter is a thin, flexible tube that extends from the reservoir. The catheter is placed into a large vein. Medicine that is inserted into the reservoir goes into the catheter and then into the vein.  How will I care for my incision site? Do not get the incision site wet. Bathe or shower as directed by your health care provider. How is my port accessed? Special steps must be taken to access the port:  Before the port is accessed, a numbing cream can be placed on the skin. This helps numb the skin over the port site.  Your health care provider uses a sterile technique to access the port. ? Your health care provider must put on a mask and sterile gloves. ? The skin over your port is cleaned carefully with an antiseptic and allowed to dry. ? The port is gently pinched between sterile gloves, and a needle is inserted into the port.  Only "non-coring" port needles should be used to access the port. Once the port is accessed, a blood return should be checked. This helps ensure that the port  is in the vein and is not clogged.  If your port needs to remain accessed for a constant infusion, a clear (transparent) bandage will be placed over the needle site. The bandage and needle will need to be changed every week, or as directed by your health care provider.  Keep the bandage covering the needle clean and dry. Do not get it wet. Follow your health care provider's instructions on how to take a shower or bath while the port is accessed.  If your port does not need to stay accessed, no bandage is needed over the port.  What is flushing? Flushing helps keep the port from getting clogged. Follow your health care provider's instructions on how and when to flush the port. Ports are usually flushed with saline solution or a medicine called heparin. The need for flushing will depend on how the port is used.  If the port is used for intermittent medicines or blood draws, the port will need to be flushed: ? After medicines have been given. ? After blood has been drawn. ? As part of routine maintenance.  If a constant infusion is running, the port may not need to be flushed.  How long will my port stay implanted? The port can stay in for as long as your health care provider thinks it is needed. When it is time for the port to come out, surgery will be   done to remove it. The procedure is similar to the one performed when the port was put in. When should I seek immediate medical care? When you have an implanted port, you should seek immediate medical care if:  You notice a bad smell coming from the incision site.  You have swelling, redness, or drainage at the incision site.  You have more swelling or pain at the port site or the surrounding area.  You have a fever that is not controlled with medicine.  This information is not intended to replace advice given to you by your health care provider. Make sure you discuss any questions you have with your health care provider. Document  Released: 03/25/2005 Document Revised: 08/31/2015 Document Reviewed: 11/30/2012 Elsevier Interactive Patient Education  2017 [employer]  

## 2017-11-10 NOTE — Progress Notes (Signed)
[facility] OFFICE PROGRESS NOTE  Katherine Cole, [place] [address]  DIAGNOSIS:Extensive stage(T2a, N2, M1c)small cell lung cancer presented withsubpleural mass within the left upper lobewithassociated ipsilateral hilar and mediastinal nodal metastasis, liver metastasis and bone metastasis  PRIOR THERAPY:None  CURRENT THERAPY:Systemic chemotherapy with carboplatin for AUC of 5 on day 1 and etoposide 100 mg/M2 on days 1, 2 and 3 with Neulasta support every 3 weeks.Carboplatin was dose reduced to an AUC of 4 and etoposide was dose reduced to 80 mg/m beginning with cycle #3. First cycle started on4/16/2019.Status post 5 cycles.  INTERVAL HISTORY: Katherine Cole 77 y.o. female returns for routine follow-up visit accompanied by her daughter.  The patient reports that she is feeling fine today and has no specific complaints.  The patient did require IV fluids following her last cycle of chemotherapy due to dehydration.  The IV fluids helped.  She is eating and drinking better and has gained back 2 pounds since her last visit.  She denies fevers and chills.  Denies chest pain, shortness of breath, cough, hemoptysis.  Denies nausea, vomiting, constipation, diarrhea.  The patient is here for evaluation prior to cycle #6 of her treatment.  MEDICAL HISTORY: Past Medical History:  Diagnosis Date  . Arthritis    "in my fingers"   . CAD (coronary artery disease)    a. MI 1999 with 2 stents at that time followed by stenting 3-4 yrs later. b. s/p DES to LAD guided by pressure wire 03/11/12.  . Colon polyps    adenomatous  . Depression   . Diabetes mellitus, type 2 ([place])   . Hyperlipemia   . Hypertension   . Osteoarthritis   . Pneumonia 06/2017  . Skin cancer 2012   "nose; right middle finger"  . Stroke [facility])     ALLERGIES:  is allergic to codeine and morphine and related.  MEDICATIONS:  Current Outpatient Medications  Medication Sig  Dispense Refill  . ALPRAZolam (XANAX) 0.25 MG tablet Take 0.25 mg by mouth at bedtime as needed for anxiety.    Marland Kitchen amLODipine (NORVASC) 10 MG tablet Take 1 tablet (10 mg total) by mouth daily. 30 tablet 0  . aspirin 81 MG tablet Take 1 tablet (81 mg total) by mouth daily. (Patient taking differently: Take 81 mg by mouth as directed. On mondays and Thursdays) 30 tablet 1  . atorvastatin (LIPITOR) 80 MG tablet Take 80 mg by mouth daily.    . clopidogrel (PLAVIX) 75 MG tablet Take 75 mg by mouth daily.    . fluticasone (FLONASE) 50 MCG/ACT nasal spray PLACE 1 SPRAY INTO BOTH NOSTRILS DAILY. 16 g 2  . guaiFENesin (MUCINEX) 600 MG 12 hr tablet Take 2 tablets (1,200 mg total) by mouth 2 (two) times daily as needed (Use for one week post discharge, then twice daily as needed). (Patient taking differently: Take 1,200 mg by mouth 2 (two) times daily as needed for cough (Use for one week post discharge, then twice daily as needed). )    . HUMALOG KWIKPEN 100 UNIT/ML KiwkPen 0-8 units three times daily after meals  2  . ipratropium-albuterol (DUONEB) 0.5-2.5 (3) MG/3ML SOLN Take 3 mLs by nebulization every 6 (six) hours as needed (Shortness of breath or wheezing). 360 mL 3  . lactose free nutrition (BOOST PLUS) LIQD Take 237 mLs by mouth 3 (three) times daily between meals. 14 Can 0  . loratadine (CLARITIN) 10 MG tablet Take 10 mg by mouth daily.    Marland Kitchen  metFORMIN (GLUCOPHAGE) 1000 MG tablet Take 1,000 mg by mouth daily.     . metoprolol succinate (TOPROL-XL) 50 MG 24 hr tablet Take 50 mg by mouth daily. Take with or immediately following a meal.    . mirtazapine (REMERON) 15 MG tablet Take 15 mg by mouth at bedtime.    . nitroGLYCERIN (NITROSTAT) 0.4 MG SL tablet Place 1 tablet (0.4 mg total) under the tongue every 5 (five) minutes as needed (up to 3 doses). For chest pain    . ondansetron (ZOFRAN ODT) 4 MG disintegrating tablet Take 1 tablet (4 mg total) by mouth every 8 (eight) hours as needed for nausea or  vomiting. 20 tablet 3  . prochlorperazine (COMPAZINE) 10 MG tablet Take 1 tablet (10 mg total) by mouth every 6 (six) hours as needed for nausea or vomiting. 30 tablet 0  . promethazine (PHENERGAN) 12.5 MG tablet Take 1 tablet (12.5 mg total) by mouth every 4 (four) hours as needed for nausea or vomiting. 20 tablet 0  . sitaGLIPtin (JANUVIA) 100 MG tablet Take 0.5 tablets (50 mg total) by mouth daily. 14 tablet 0  . sodium chloride (OCEAN) 0.65 % SOLN nasal spray Place 1 spray into both nostrils as needed for congestion (use twice daily for one week post discharge, then as needed).  0   No current facility-administered medications for this visit.     SURGICAL HISTORY:  Past Surgical History:  Procedure Laterality Date  . APPENDECTOMY  ~ 1965  . CATARACT EXTRACTION    . CORONARY ANGIOPLASTY WITH STENT PLACEMENT  1999; 2004; 03/11/2012   "2 + 1 + 1; total of 4" (03/11/2012)  . FEMUR FRACTURE SURGERY  2011   RLE (03/11/2012)  . IR FLUORO GUIDE PORT INSERTION RIGHT  07/18/2017  . IR US GUIDE VASC ACCESS RIGHT  07/18/2017  . PERCUTANEOUS CORONARY STENT INTERVENTION (PCI-S) N/A 03/11/2012   Procedure: PERCUTANEOUS CORONARY STENT INTERVENTION (PCI-S);  Surgeon: Sherren Mocha, MD;  Location: [facility] CATH LAB;  Service: Cardiovascular;  Laterality: N/A;  . SKIN CANCER EXCISION  2013   "nose & right middle finger; actinic keratosis" (03/11/2012)  . TONSILLECTOMY AND ADENOIDECTOMY  ~ 1962  . TUBAL LIGATION  1977  . VIDEO BRONCHOSCOPY WITH ENDOBRONCHIAL ULTRASOUND N/A 07/04/2017   Procedure: VIDEO BRONCHOSCOPY WITH ENDOBRONCHIAL ULTRASOUND;  Surgeon: Collene Gobble, MD;  Location: [facility] OR;  Service: Thoracic;  Laterality: N/A;    REVIEW OF SYSTEMS:   Review of Systems  Constitutional: Negative for appetite change, chills, fever and unexpected weight change.  Positive for fatigue. HENT:   Negative for mouth sores, nosebleeds, sore throat and trouble swallowing.   Eyes: Negative for eye problems and icterus.   Respiratory: Negative for cough, hemoptysis, shortness of breath and wheezing.   Cardiovascular: Negative for chest pain and leg swelling.  Gastrointestinal: Negative for abdominal pain, constipation, diarrhea, nausea and vomiting.  Genitourinary: Negative for bladder incontinence, difficulty urinating, dysuria, frequency and hematuria.   Musculoskeletal: Negative for back pain, gait problem, neck pain and neck stiffness.  Skin: Negative for itching and rash.  Neurological: Negative for dizziness, extremity weakness, gait problem, headaches, light-headedness and seizures.  Hematological: Negative for adenopathy. Does not bruise/bleed easily.  Psychiatric/Behavioral: Negative for confusion, depression and sleep disturbance. The patient is not nervous/anxious.     PHYSICAL EXAMINATION:  Blood pressure (!) 177/65, pulse 70, temperature 97.6 F (36.4 C), temperature source Oral, resp. rate 17, height 5\' 1"  (1.549 m), weight 79 lb 12.8 oz (36.2 kg), SpO2 100 %.  ECOG PERFORMANCE STATUS: 1 - Symptomatic but completely ambulatory  Physical Exam  Constitutional: Oriented to person, place, and time. No distress.  HENT:  Head: Normocephalic and atraumatic.  Mouth/Throat: Oropharynx is clear and moist. No oropharyngeal exudate.  Eyes: Conjunctivae are normal. Right eye exhibits no discharge. Left eye exhibits no discharge. No scleral icterus.  Neck: Normal range of motion. Neck supple.  Cardiovascular: Normal rate, regular rhythm, normal heart sounds and intact distal pulses.   Pulmonary/Chest: Effort normal and breath sounds normal. No respiratory distress. No wheezes. No rales.  Abdominal: Soft. Bowel sounds are normal. Exhibits no distension and no mass. There is no tenderness.  Musculoskeletal: Normal range of motion. Exhibits no edema.  Lymphadenopathy:    No cervical adenopathy.  Neurological: Alert and oriented to person, place, and time. Exhibits normal muscle tone. Gait normal.  Coordination normal.  Skin: Skin is warm and dry. No rash noted. Not diaphoretic. No erythema. No pallor.  Psychiatric: Mood, memory and judgment normal.  Vitals reviewed.  LABORATORY DATA: Lab Results  Component Value Date   WBC 11.2 (H) 11/10/2017   HGB 9.3 (L) 11/10/2017   HCT 28.1 (L) 11/10/2017   MCV 103.7 (H) 11/10/2017   PLT 187 11/10/2017      Chemistry      Component Value Date/Time   NA 132 (L) 11/10/2017 0933   K 4.5 11/10/2017 0933   CL 101 11/10/2017 0933   CO2 23 11/10/2017 0933   BUN 19 11/10/2017 0933   CREATININE 0.76 11/10/2017 0933      Component Value Date/Time   CALCIUM 9.2 11/10/2017 0933   ALKPHOS 142 (H) 11/10/2017 0933   AST 13 (L) 11/10/2017 0933   ALT 14 11/10/2017 0933   BILITOT 0.2 (L) 11/10/2017 0933       RADIOGRAPHIC STUDIES:  Dg Chest 2 View  Result Date: 10/28/2017 CLINICAL DATA:  Left upper lobe malignancy. EXAM: CHEST - 2 VIEW COMPARISON:  CT scan of September 25, 2017.  Radiograph of September 12, 2017. FINDINGS: Stable cardiac size. Atherosclerosis of thoracic aorta is noted. Right internal jugular Port-A-Cath is noted with distal tip in expected position of the SVC. No pneumothorax or pleural effusion is noted. Stable appearance of mass seen laterally in the left upper lobe consistent with malignancy. Possible new right suprahilar density is noted. Bony thorax is unremarkable. IMPRESSION: Stable lateral left upper lobe mass is noted consistent with malignancy. Possible new right suprahilar mass is noted, although this may represent artifact due to overlying Port-A-Cath. CT scan may be performed for further evaluation. Aortic Atherosclerosis (ICD10-I70.0). Electronically Signed   By: Marijo Conception, M.D.   On: 10/28/2017 15:02     ASSESSMENT/PLAN:  Primary small cell carcinoma of left lung [facility]) This is a very pleasant 77 year old white female recently diagnosed with extensive stage small cell lung cancer and currently undergoing systemic  chemotherapy with carboplatin and etoposide.Carboplatin was dose reduced to an AUC of 4 and etoposide was dose reduced to 80 mg/m beginning with cycle #3. She tolerated the dose reduction much better.  Status post 5 cycles of treatment.  The patient was seen with Dr. Julien Nordmann.  Recommend that she proceed with cycle 6 of her treatment today as scheduled.  The patient will continue to have weekly labs for 2 more weeks and will have a restaging CT scan of the chest, abdomen, pelvis in 3 to 4 weeks.  She will follow-up 1 to 2 days after the CT scan to discuss  the results.  For the malnutrition and weight loss she is followed by the dietitian at the cancer center.  The patient was advised to call immediately if she has any concerning symptoms in the interval. The patient voices understanding of current disease status and treatment options and is in agreement with the current care plan.  All questions were answered. The patient knows to call the clinic with any problems, questions or concerns. We can certainly see the patient much sooner if necessary.    Orders Placed This Encounter  Procedures  . CT ABDOMEN PELVIS W CONTRAST    Standing Status:   Future    Standing Expiration Date:   11/11/2018    Order Specific Question:   If indicated for the ordered procedure, I authorize the administration of contrast media per Radiology protocol    Answer:   Yes    Order Specific Question:   Preferred imaging location?    Answer:   [facility]    Order Specific Question:   Radiology Contrast Protocol - do NOT remove file path    Answer:   \\[email or web address]    Order Specific Question:   ** REASON FOR EXAM (FREE TEXT)    Answer:   Lung cancer. Restaging.  . CT CHEST W CONTRAST    Standing Status:   Future    Standing Expiration Date:   11/11/2018    Order Specific Question:   If indicated for the ordered procedure, I authorize the administration of contrast media per  Radiology protocol    Answer:   Yes    Order Specific Question:   Preferred imaging location?    Answer:   [facility]    Order Specific Question:   Radiology Contrast Protocol - do NOT remove file path    Answer:   \\[email or web address]    Order Specific Question:   ** REASON FOR EXAM (FREE TEXT)    Answer:   Lung cancer. Restaging.     Katherine Bussing, DNP, AGPCNP-BC, AOCNP 11/10/17   ADDENDUM: Hematology/Oncology Attending: I had a face-to-face encounter with the patient today.  I recommended her care plan.  This is a very pleasant 77 years old white female with extensive stage small cell lung cancer.  She is currently undergoing systemic chemotherapy with carboplatin and etoposide status post 5 cycles.  She had a rough time with the treatment with the first few cycles but has been tolerating the last few cycles of fairly well.  She gained 2 pounds since her last visit.  The patient is feeling well with no significant chest pain but has baseline shortness of breath increased with exertion. I recommended for the patient to proceed with cycle #6 today as a scheduled I will see her back for follow-up visit in 3-4 weeks for evaluation after repeating CT scan of the chest, abdomen and pelvis for restaging of her disease. She was advised to call immediately if she has any concerning symptoms in the interval.  Disclaimer: This note was dictated with voice recognition software. Similar sounding words can inadvertently be transcribed and may be missed upon review. [name], MD 11/10/17

## 2017-11-10 NOTE — Assessment & Plan Note (Addendum)
This is a very pleasant 77 year old white female recently diagnosed with extensive stage small cell lung cancer and currently undergoing systemic chemotherapy with carboplatin and etoposide.Carboplatin was dose reduced to an AUC of 4 and etoposide was dose reduced to 80 mg/m beginning with cycle #3. She tolerated the dose reduction much better.  Status post 5 cycles of treatment.  The patient was seen with Dr. Julien Nordmann.  Recommend that she proceed with cycle 6 of her treatment today as scheduled.  The patient will continue to have weekly labs for 2 more weeks and will have a restaging CT scan of the chest, abdomen, pelvis in 3 to 4 weeks.  She will follow-up 1 to 2 days after the CT scan to discuss the results.  For the malnutrition and weight loss she is followed by the dietitian at the cancer center.  The patient was advised to call immediately if she has any concerning symptoms in the interval. The patient voices understanding of current disease status and treatment options and is in agreement with the current care plan.  All questions were answered. The patient knows to call the clinic with any problems, questions or concerns. We can certainly see the patient much sooner if necessary.

## 2017-11-10 NOTE — Patient Instructions (Signed)
[facility] Discharge Instructions for Patients Receiving Chemotherapy  Today you received the following chemotherapy agents:  Carboplatin, Etoposide  To help prevent nausea and vomiting after your treatment, we encourage you to take your nausea medication as prescribed.   If you develop nausea and vomiting that is not controlled by your nausea medication, call the clinic.   BELOW ARE SYMPTOMS THAT SHOULD BE REPORTED IMMEDIATELY:  *FEVER GREATER THAN 100.5 F  *CHILLS WITH OR WITHOUT FEVER  NAUSEA AND VOMITING THAT IS NOT CONTROLLED WITH YOUR NAUSEA MEDICATION  *UNUSUAL SHORTNESS OF BREATH  *UNUSUAL BRUISING OR BLEEDING  TENDERNESS IN MOUTH AND THROAT WITH OR WITHOUT PRESENCE OF ULCERS  *URINARY PROBLEMS  *BOWEL PROBLEMS  UNUSUAL RASH Items with * indicate a potential emergency and should be followed up as soon as possible.  Feel free to call the clinic should you have any questions or concerns. The clinic phone number is [phone].  Please show the [facility] at check-in to the Emergency Department and triage nurse.

## 2017-11-11 ENCOUNTER — Inpatient Hospital Stay: Payer: Medicare Other

## 2017-11-11 DIAGNOSIS — Z5111 Encounter for antineoplastic chemotherapy: Secondary | ICD-10-CM | POA: Diagnosis not present

## 2017-11-11 DIAGNOSIS — D649 Anemia, unspecified: Secondary | ICD-10-CM | POA: Diagnosis not present

## 2017-11-11 DIAGNOSIS — C787 Secondary malignant neoplasm of liver and intrahepatic bile duct: Secondary | ICD-10-CM | POA: Diagnosis not present

## 2017-11-11 DIAGNOSIS — I10 Essential (primary) hypertension: Secondary | ICD-10-CM | POA: Diagnosis not present

## 2017-11-11 DIAGNOSIS — C3412 Malignant neoplasm of upper lobe, left bronchus or lung: Secondary | ICD-10-CM | POA: Diagnosis not present

## 2017-11-11 DIAGNOSIS — C7951 Secondary malignant neoplasm of bone: Secondary | ICD-10-CM | POA: Diagnosis not present

## 2017-11-11 MED ORDER — HEPARIN SOD (PORK) LOCK FLUSH 100 UNIT/ML IV SOLN
500.0000 [IU] | Freq: Once | INTRAVENOUS | Status: AC | PRN
  Administered 2017-11-11: 500 [IU]
  Filled 2017-11-11: qty 5

## 2017-11-11 MED ORDER — SODIUM CHLORIDE 0.9 % IV SOLN
Freq: Once | INTRAVENOUS | Status: AC
  Administered 2017-11-11: 14:00:00 via INTRAVENOUS
  Filled 2017-11-11: qty 250

## 2017-11-11 MED ORDER — DEXAMETHASONE SODIUM PHOSPHATE 10 MG/ML IJ SOLN
INTRAMUSCULAR | Status: AC
  Filled 2017-11-11: qty 1

## 2017-11-11 MED ORDER — ETOPOSIDE CHEMO INJECTION 1 GM/50ML
80.0000 mg/m2 | Freq: Once | INTRAVENOUS | Status: AC
  Administered 2017-11-11: 100 mg via INTRAVENOUS
  Filled 2017-11-11: qty 5

## 2017-11-11 MED ORDER — DEXAMETHASONE SODIUM PHOSPHATE 10 MG/ML IJ SOLN
10.0000 mg | Freq: Once | INTRAMUSCULAR | Status: AC
  Administered 2017-11-11: 10 mg via INTRAVENOUS

## 2017-11-11 MED ORDER — SODIUM CHLORIDE 0.9% FLUSH
10.0000 mL | INTRAVENOUS | Status: DC | PRN
  Administered 2017-11-11: 10 mL
  Filled 2017-11-11: qty 10

## 2017-11-11 NOTE — Progress Notes (Signed)
Nutrition Follow-up:  Patient with small cell lung cancer.  Met with patient during infusion today.  Patient reports that she has been eating whatever she wants.  Reports no nutrition impact symptoms recently but will have a decreased appetite following chemotherapy today.  Reports she ate bacon, egg and cheese sandwich today for breakfast.  Usually has a sandwich for lunch. Last night ate hamburger and corn on cob for dinner last night.  Reports that she is still drinking boost     Medications: reivewed  Labs: reviewed  Anthropometrics:   Weight 79 lb 12.8 oz today stable from 80 lb 3.2 oz on 6/25    NUTRITION DIAGNOSIS: inadequate oral intake improving   MALNUTRITION DIAGNOSIS: severe malnutrition improving with weight gain   INTERVENTION:    Encouraged patient to continue eating high calories and high protein foods Coupons for boost products provided   MONITORING, EVALUATION, GOAL: patient will tolerate increased calories and protein to promote weight gain    NEXT VISIT: to be determined   Andora Krull B. Zenia Resides, [place], [place] Registered Dietitian [phone] (pager)

## 2017-11-11 NOTE — Patient Instructions (Signed)
[facility] Discharge Instructions for Patients Receiving Chemotherapy  Today you received the following chemotherapy agents: Etoposide (Vepesid)  To help prevent nausea and vomiting after your treatment, we encourage you to take your nausea medication as prescribed.    If you develop nausea and vomiting that is not controlled by your nausea medication, call the clinic.   BELOW ARE SYMPTOMS THAT SHOULD BE REPORTED IMMEDIATELY:  *FEVER GREATER THAN 100.5 F  *CHILLS WITH OR WITHOUT FEVER  NAUSEA AND VOMITING THAT IS NOT CONTROLLED WITH YOUR NAUSEA MEDICATION  *UNUSUAL SHORTNESS OF BREATH  *UNUSUAL BRUISING OR BLEEDING  TENDERNESS IN MOUTH AND THROAT WITH OR WITHOUT PRESENCE OF ULCERS  *URINARY PROBLEMS  *BOWEL PROBLEMS  UNUSUAL RASH Items with * indicate a potential emergency and should be followed up as soon as possible.  Feel free to call the clinic should you have any questions or concerns. The clinic phone number is [phone].  Please show the [facility] at check-in to the Emergency Department and triage nurse.

## 2017-11-12 ENCOUNTER — Inpatient Hospital Stay: Payer: Medicare Other

## 2017-11-12 VITALS — BP 159/76 | HR 64 | Temp 98.9°F | Resp 18

## 2017-11-12 DIAGNOSIS — I10 Essential (primary) hypertension: Secondary | ICD-10-CM | POA: Diagnosis not present

## 2017-11-12 DIAGNOSIS — C787 Secondary malignant neoplasm of liver and intrahepatic bile duct: Secondary | ICD-10-CM | POA: Diagnosis not present

## 2017-11-12 DIAGNOSIS — C3412 Malignant neoplasm of upper lobe, left bronchus or lung: Secondary | ICD-10-CM

## 2017-11-12 DIAGNOSIS — Z5111 Encounter for antineoplastic chemotherapy: Secondary | ICD-10-CM | POA: Diagnosis not present

## 2017-11-12 DIAGNOSIS — C7951 Secondary malignant neoplasm of bone: Secondary | ICD-10-CM | POA: Diagnosis not present

## 2017-11-12 DIAGNOSIS — D649 Anemia, unspecified: Secondary | ICD-10-CM | POA: Diagnosis not present

## 2017-11-12 MED ORDER — PEGFILGRASTIM 6 MG/0.6ML ~~LOC~~ PSKT
PREFILLED_SYRINGE | SUBCUTANEOUS | Status: AC
  Filled 2017-11-12: qty 0.6

## 2017-11-12 MED ORDER — PALONOSETRON HCL INJECTION 0.25 MG/5ML
INTRAVENOUS | Status: AC
Start: 2017-11-12 — End: ?
  Filled 2017-11-12: qty 5

## 2017-11-12 MED ORDER — SODIUM CHLORIDE 0.9 % IV SOLN
80.0000 mg/m2 | Freq: Once | INTRAVENOUS | Status: AC
  Administered 2017-11-12: 100 mg via INTRAVENOUS
  Filled 2017-11-12: qty 5

## 2017-11-12 MED ORDER — SODIUM CHLORIDE 0.9% FLUSH
10.0000 mL | INTRAVENOUS | Status: DC | PRN
  Administered 2017-11-12: 10 mL
  Filled 2017-11-12: qty 10

## 2017-11-12 MED ORDER — FOSAPREPITANT DIMEGLUMINE INJECTION 150 MG
Freq: Once | INTRAVENOUS | Status: AC
  Administered 2017-11-12: 16:00:00 via INTRAVENOUS
  Filled 2017-11-12: qty 5

## 2017-11-12 MED ORDER — PALONOSETRON HCL INJECTION 0.25 MG/5ML
0.2500 mg | Freq: Once | INTRAVENOUS | Status: AC
  Administered 2017-11-12: 0.25 mg via INTRAVENOUS

## 2017-11-12 MED ORDER — SODIUM CHLORIDE 0.9 % IV SOLN
Freq: Once | INTRAVENOUS | Status: AC
  Administered 2017-11-12: 15:00:00 via INTRAVENOUS
  Filled 2017-11-12: qty 250

## 2017-11-12 MED ORDER — HEPARIN SOD (PORK) LOCK FLUSH 100 UNIT/ML IV SOLN
500.0000 [IU] | Freq: Once | INTRAVENOUS | Status: AC | PRN
  Administered 2017-11-12: 500 [IU]
  Filled 2017-11-12: qty 5

## 2017-11-12 MED ORDER — PEGFILGRASTIM 6 MG/0.6ML ~~LOC~~ PSKT
6.0000 mg | PREFILLED_SYRINGE | Freq: Once | SUBCUTANEOUS | Status: AC
  Administered 2017-11-12: 6 mg via SUBCUTANEOUS

## 2017-11-12 NOTE — Patient Instructions (Signed)
[facility] Discharge Instructions for Patients Receiving Chemotherapy  Today you received the following chemotherapy agents: Etoposide (Vepesid)  To help prevent nausea and vomiting after your treatment, we encourage you to take your nausea medication as prescribed. Received Aloxi during treatment today-->Take Compazine (not Zofran) for the next 3 days as needed.    If you develop nausea and vomiting that is not controlled by your nausea medication, call the clinic.   BELOW ARE SYMPTOMS THAT SHOULD BE REPORTED IMMEDIATELY:  *FEVER GREATER THAN 100.5 F  *CHILLS WITH OR WITHOUT FEVER  NAUSEA AND VOMITING THAT IS NOT CONTROLLED WITH YOUR NAUSEA MEDICATION  *UNUSUAL SHORTNESS OF BREATH  *UNUSUAL BRUISING OR BLEEDING  TENDERNESS IN MOUTH AND THROAT WITH OR WITHOUT PRESENCE OF ULCERS  *URINARY PROBLEMS  *BOWEL PROBLEMS  UNUSUAL RASH Items with * indicate a potential emergency and should be followed up as soon as possible.  Feel free to call the clinic should you have any questions or concerns. The clinic phone number is [phone].  Please show the [place] at check-in to the Emergency Department and triage nurse.

## 2017-11-15 ENCOUNTER — Encounter (HOSPITAL_COMMUNITY): Payer: Self-pay | Admitting: Emergency Medicine

## 2017-11-15 ENCOUNTER — Emergency Department (HOSPITAL_COMMUNITY)
Admission: EM | Admit: 2017-11-15 | Discharge: 2017-11-15 | Disposition: A | Discharge status: Home / Self Care | Payer: Medicare Other | Attending: Emergency Medicine | Admitting: Emergency Medicine

## 2017-11-15 DIAGNOSIS — Z79899 Other long term (current) drug therapy: Secondary | ICD-10-CM | POA: Diagnosis not present

## 2017-11-15 DIAGNOSIS — E119 Type 2 diabetes mellitus without complications: Secondary | ICD-10-CM | POA: Diagnosis not present

## 2017-11-15 DIAGNOSIS — K5641 Fecal impaction: Secondary | ICD-10-CM | POA: Insufficient documentation

## 2017-11-15 DIAGNOSIS — J449 Chronic obstructive pulmonary disease, unspecified: Secondary | ICD-10-CM | POA: Insufficient documentation

## 2017-11-15 DIAGNOSIS — Z7982 Long term (current) use of aspirin: Secondary | ICD-10-CM | POA: Diagnosis not present

## 2017-11-15 DIAGNOSIS — Z794 Long term (current) use of insulin: Secondary | ICD-10-CM | POA: Diagnosis not present

## 2017-11-15 DIAGNOSIS — F17210 Nicotine dependence, cigarettes, uncomplicated: Secondary | ICD-10-CM | POA: Diagnosis not present

## 2017-11-15 DIAGNOSIS — I10 Essential (primary) hypertension: Secondary | ICD-10-CM | POA: Diagnosis not present

## 2017-11-15 DIAGNOSIS — K5900 Constipation, unspecified: Secondary | ICD-10-CM

## 2017-11-15 DIAGNOSIS — I2510 Atherosclerotic heart disease of native coronary artery without angina pectoris: Secondary | ICD-10-CM | POA: Diagnosis not present

## 2017-11-15 MED ORDER — MAGNESIUM CITRATE PO SOLN
1.0000 | Freq: Once | ORAL | Status: AC
  Administered 2017-11-15: 1 via ORAL
  Filled 2017-11-15: qty 296

## 2017-11-15 NOTE — ED Triage Notes (Signed)
Patient c/o constipation. Reports last BM on Wednesday. Last chemo on Wednesday. Hx lung cancer mets to liver and bone. Denies N/V and abdominal pain.

## 2017-11-15 NOTE — ED Notes (Signed)
Registration reports patient is in the restroom at this time.

## 2017-11-15 NOTE — ED Provider Notes (Signed)
[facility] DEPT Provider Note   CSN: [id number] Arrival date & time: 11/15/17  1155     History   Chief Complaint Chief Complaint  Patient presents with  . Constipation    HPI Katherine Cole is a 77 y.o. female.  77 year old female presents with constipation x3 days.  No abdominal discomfort, fever, emesis.  Feels rectal pressure at this time.  Attempted oral medications for this without relief.  Called the cancer center and told to come here.  Patient is currently receiving active chemotherapy.     Past Medical History:  Diagnosis Date  . Arthritis    "in my fingers"   . CAD (coronary artery disease)    a. MI 1999 with 2 stents at that time followed by stenting 3-4 yrs later. b. s/p DES to LAD guided by pressure wire 03/11/12.  . Colon polyps    adenomatous  . Depression   . Diabetes mellitus, type 2 ([place])   . Hyperlipemia   . Hypertension   . Osteoarthritis   . Pneumonia 06/2017  . Skin cancer 2012   "nose; right middle finger"  . Stroke [facility])     Patient Active Problem List   Diagnosis Date Noted  . COPD II still smoking  08/21/2017  . Dehydration 08/15/2017  . Antineoplastic chemotherapy induced pancytopenia ([place]) 08/03/2017  . Pancytopenia ([place]) 08/03/2017  . DNR (do not resuscitate) 07/29/2017  . Port-A-Cath in place 07/22/2017  . Encounter for antineoplastic chemotherapy 07/12/2017  . Goals of care, counseling/discussion 07/12/2017  . Moderate protein-calorie malnutrition ([place]) 07/12/2017  . Encounter for smoking cessation counseling 07/12/2017  . Primary small cell carcinoma of left lung ([place]) 07/12/2017  . Primary malignant neoplasm of left upper lobe of lung ([place]) 07/08/2017  . Atypical pneumonia   . Acute CVA (cerebrovascular accident) ([place]) 01/20/2014  . Exertional angina ([place]) 03/12/2012  . CAD (coronary artery disease) 02/10/2012  . Cigarette smoker 02/10/2012  . Essential hypertension 02/10/2012  . Benign  neoplasm of colon 08/01/2007  . Type 2 diabetes mellitus with hypoglycemia without coma ([place]) 08/01/2007  . Hyperlipidemia 08/01/2007  . GASTROESOPHAGEAL REFLUX DISEASE 08/01/2007  . OSTEOARTHRITIS 08/01/2007    Past Surgical History:  Procedure Laterality Date  . APPENDECTOMY  ~ 1965  . CATARACT EXTRACTION    . CORONARY ANGIOPLASTY WITH STENT PLACEMENT  1999; 2004; 03/11/2012   "2 + 1 + 1; total of 4" (03/11/2012)  . FEMUR FRACTURE SURGERY  2011   RLE (03/11/2012)  . IR FLUORO GUIDE PORT INSERTION RIGHT  07/18/2017  . IR US GUIDE VASC ACCESS RIGHT  07/18/2017  . PERCUTANEOUS CORONARY STENT INTERVENTION (PCI-S) N/A 03/11/2012   Procedure: PERCUTANEOUS CORONARY STENT INTERVENTION (PCI-S);  Surgeon: Sherren Mocha, MD;  Location: [facility] CATH LAB;  Service: Cardiovascular;  Laterality: N/A;  . SKIN CANCER EXCISION  2013   "nose & right middle finger; actinic keratosis" (03/11/2012)  . TONSILLECTOMY AND ADENOIDECTOMY  ~ 1962  . TUBAL LIGATION  1977  . VIDEO BRONCHOSCOPY WITH ENDOBRONCHIAL ULTRASOUND N/A 07/04/2017   Procedure: VIDEO BRONCHOSCOPY WITH ENDOBRONCHIAL ULTRASOUND;  Surgeon: Collene Gobble, MD;  Location: [facility] OR;  Service: Thoracic;  Laterality: N/A;     OB History   None      Home Medications    Prior to Admission medications   Medication Sig Start Date End Date Taking? Authorizing Provider  ALPRAZolam Duanne Moron) 0.25 MG tablet Take 0.25 mg by mouth at bedtime as needed for anxiety.    [provider]  amLODipine (NORVASC) 10 MG tablet Take 1 tablet (10 mg total) by mouth daily. 08/11/17   Kayleen Memos, DO  aspirin 81 MG tablet Take 1 tablet (81 mg total) by mouth daily. Patient taking differently: Take 81 mg by mouth as directed. On mondays and Thursdays 01/21/14   Annita Brod, MD  atorvastatin (LIPITOR) 80 MG tablet Take 80 mg by mouth daily.    [provider]  clopidogrel (PLAVIX) 75 MG tablet Take 75 mg by mouth daily.    [provider]    fluticasone (FLONASE) 50 MCG/ACT nasal spray PLACE 1 SPRAY INTO BOTH NOSTRILS DAILY. 11/03/17   Tanda Rockers, MD  guaiFENesin (MUCINEX) 600 MG 12 hr tablet Take 2 tablets (1,200 mg total) by mouth 2 (two) times daily as needed (Use for one week post discharge, then twice daily as needed). Patient taking differently: Take 1,200 mg by mouth 2 (two) times daily as needed for cough (Use for one week post discharge, then twice daily as needed).  08/09/17   Donita Brooks, NP  HUMALOG KWIKPEN 100 UNIT/ML KiwkPen 0-8 units three times daily after meals 07/23/17   [provider]  ipratropium-albuterol (DUONEB) 0.5-2.5 (3) MG/3ML SOLN Take 3 mLs by nebulization every 6 (six) hours as needed (Shortness of breath or wheezing). 08/09/17   Donita Brooks, NP  lactose free nutrition (BOOST PLUS) LIQD Take 237 mLs by mouth 3 (three) times daily between meals. 08/10/17   Kayleen Memos, DO  loratadine (CLARITIN) 10 MG tablet Take 10 mg by mouth daily.    [provider]  metFORMIN (GLUCOPHAGE) 1000 MG tablet Take 1,000 mg by mouth daily.     [provider]  metoprolol succinate (TOPROL-XL) 50 MG 24 hr tablet Take 50 mg by mouth daily. Take with or immediately following a meal.    [provider]  mirtazapine (REMERON) 15 MG tablet Take 15 mg by mouth at bedtime.    [provider]  nitroGLYCERIN (NITROSTAT) 0.4 MG SL tablet Place 1 tablet (0.4 mg total) under the tongue every 5 (five) minutes as needed (up to 3 doses). For chest pain 03/12/12   Dunn, Lisbeth Renshaw N, PA-C  ondansetron (ZOFRAN ODT) 4 MG disintegrating tablet Take 1 tablet (4 mg total) by mouth every 8 (eight) hours as needed for nausea or vomiting. 08/28/17   Tanner, Lyndon Code., PA-C  prochlorperazine (COMPAZINE) 10 MG tablet Take 1 tablet (10 mg total) by mouth every 6 (six) hours as needed for nausea or vomiting. 10/23/17   Curt Bears, MD  promethazine (PHENERGAN) 12.5 MG tablet Take 1 tablet (12.5 mg total) by  mouth every 4 (four) hours as needed for nausea or vomiting. 08/27/17   Horton, Barbette Hair, MD  sitaGLIPtin (JANUVIA) 100 MG tablet Take 0.5 tablets (50 mg total) by mouth daily. 08/10/17   Kayleen Memos, DO  sodium chloride (OCEAN) 0.65 % SOLN nasal spray Place 1 spray into both nostrils as needed for congestion (use twice daily for one week post discharge, then as needed). 08/09/17   Donita Brooks, NP    Family History Family History  Problem Relation Age of Onset  . Liver cancer Mother   . Brain cancer Father   . Stomach cancer Father   . Colon cancer Brother 34  . Lung cancer Sister   . Lung cancer Brother   . Stomach cancer Brother   . Diabetes Daughter     Social History Social  History   Tobacco Use  . Smoking status: Current Every Day Smoker    Packs/day: 0.50    Years: 51.00    Pack years: 25.50    Types: Cigarettes  . Smokeless tobacco: Never Used  Substance Use Topics  . Alcohol use: No  . Drug use: No     Allergies   Codeine and Morphine and related   Review of Systems Review of Systems  All other systems reviewed and are negative.    Physical Exam Updated Vital Signs BP (!) 118/96 (BP Location: Left Arm)   Pulse 81   Temp 97.7 F (36.5 C) (Oral)   Resp 18   SpO2 100%   Physical Exam  Constitutional: She is oriented to person, place, and time. She appears well-developed and well-nourished.  Non-toxic appearance. No distress.  HENT:  Head: Normocephalic and atraumatic.  Eyes: Pupils are equal, round, and reactive to light. Conjunctivae, EOM and lids are normal.  Neck: Normal range of motion. Neck supple. No tracheal deviation present. No thyroid mass present.  Cardiovascular: Normal rate, regular rhythm and normal heart sounds. Exam reveals no gallop.  No murmur heard. Pulmonary/Chest: Effort normal and breath sounds normal. No stridor. No respiratory distress. She has no decreased breath sounds. She has no wheezes. She has no rhonchi. She has no  rales.  Abdominal: Soft. Normal appearance and bowel sounds are normal. She exhibits no distension. There is no tenderness. There is no rebound and no CVA tenderness.  Genitourinary: Rectal exam shows no tenderness.  Genitourinary Comments: Hard stool noted in rectal vault  Musculoskeletal: Normal range of motion. She exhibits no edema or tenderness.  Neurological: She is alert and oriented to person, place, and time. She has normal strength. No cranial nerve deficit or sensory deficit. GCS eye subscore is 4. GCS verbal subscore is 5. GCS motor subscore is 6.  Skin: Skin is warm and dry. No abrasion and no rash noted.  Psychiatric: She has a normal mood and affect. Her speech is normal and behavior is normal.  Nursing note and vitals reviewed.    ED Treatments / Results  Labs (all labs ordered are listed, but only abnormal results are displayed) Labs Reviewed - No data to display  EKG None  Radiology No results found.  Procedures Procedures (including critical care time)  Medications Ordered in ED Medications  magnesium citrate solution 1 Bottle (has no administration in time range)     Initial Impression / Assessment and Plan / ED Course  I have reviewed the triage vital signs and the nursing notes.  Pertinent labs & imaging results that were available during my care of the patient were reviewed by me and considered in my medical decision making (see chart for details).     Patient manually disimpacted here.  Given mag citrate.  sHe has no abdominal tenderness at this time.  Has no distention.  Low suspicion for bowel obstruction.  Stable for discharge with strict return precautions  Final Clinical Impressions(s) / ED Diagnoses   Final diagnoses:  None    ED Discharge Orders    None       Lacretia Leigh, MD 11/15/17 1305

## 2017-11-17 ENCOUNTER — Telehealth: Payer: Self-pay | Admitting: Medical

## 2017-11-17 ENCOUNTER — Inpatient Hospital Stay: Payer: Medicare Other

## 2017-11-17 ENCOUNTER — Inpatient Hospital Stay (HOSPITAL_BASED_OUTPATIENT_CLINIC_OR_DEPARTMENT_OTHER): Payer: Medicare Other | Admitting: Medical

## 2017-11-17 VITALS — BP 181/64 | HR 77 | Temp 98.9°F | Resp 14 | Ht 61.0 in | Wt 80.1 lb

## 2017-11-17 DIAGNOSIS — C3412 Malignant neoplasm of upper lobe, left bronchus or lung: Secondary | ICD-10-CM | POA: Diagnosis not present

## 2017-11-17 DIAGNOSIS — C3492 Malignant neoplasm of unspecified part of left bronchus or lung: Secondary | ICD-10-CM | POA: Diagnosis not present

## 2017-11-17 DIAGNOSIS — D649 Anemia, unspecified: Secondary | ICD-10-CM

## 2017-11-17 DIAGNOSIS — I10 Essential (primary) hypertension: Secondary | ICD-10-CM | POA: Diagnosis not present

## 2017-11-17 DIAGNOSIS — Z5111 Encounter for antineoplastic chemotherapy: Secondary | ICD-10-CM | POA: Diagnosis not present

## 2017-11-17 DIAGNOSIS — C7951 Secondary malignant neoplasm of bone: Secondary | ICD-10-CM | POA: Diagnosis not present

## 2017-11-17 DIAGNOSIS — C787 Secondary malignant neoplasm of liver and intrahepatic bile duct: Secondary | ICD-10-CM | POA: Diagnosis not present

## 2017-11-17 DIAGNOSIS — Z95828 Presence of other vascular implants and grafts: Secondary | ICD-10-CM

## 2017-11-17 LAB — CBC WITH DIFFERENTIAL (CANCER CENTER ONLY)
BASOS ABS: 0 10*3/uL (ref 0.0–0.1)
BASOS PCT: 0 %
EOS PCT: 0 %
Eosinophils Absolute: 0 10*3/uL (ref 0.0–0.5)
HEMATOCRIT: 23.7 % — AB (ref 34.8–46.6)
Hemoglobin: 7.8 g/dL — ABNORMAL LOW (ref 11.6–15.9)
Lymphocytes Relative: 7 %
Lymphs Abs: 1.6 10*3/uL (ref 0.9–3.3)
MCH: 34.7 pg — ABNORMAL HIGH (ref 25.1–34.0)
MCHC: 32.9 g/dL (ref 31.5–36.0)
MCV: 105.3 fL — ABNORMAL HIGH (ref 79.5–101.0)
MONOS PCT: 2 %
Monocytes Absolute: 0.5 10*3/uL (ref 0.1–0.9)
Neutro Abs: 20.2 10*3/uL — ABNORMAL HIGH (ref 1.5–6.5)
Neutrophils Relative %: 91 %
Platelet Count: 71 10*3/uL — ABNORMAL LOW (ref 145–400)
RBC: 2.25 MIL/uL — ABNORMAL LOW (ref 3.70–5.45)
RDW: 16.9 % — ABNORMAL HIGH (ref 11.2–14.5)
WBC Count: 22.3 10*3/uL — ABNORMAL HIGH (ref 3.9–10.3)

## 2017-11-17 LAB — CMP (CANCER CENTER ONLY)
ALBUMIN: 3.3 g/dL — AB (ref 3.5–5.0)
ALT: 14 U/L (ref 0–44)
AST: 12 U/L — ABNORMAL LOW (ref 15–41)
Alkaline Phosphatase: 196 U/L — ABNORMAL HIGH (ref 38–126)
Anion gap: 9 (ref 5–15)
BILIRUBIN TOTAL: 0.3 mg/dL (ref 0.3–1.2)
BUN: 20 mg/dL (ref 8–23)
CO2: 26 mmol/L (ref 22–32)
Calcium: 8.7 mg/dL — ABNORMAL LOW (ref 8.9–10.3)
Chloride: 97 mmol/L — ABNORMAL LOW (ref 98–111)
Creatinine: 0.85 mg/dL (ref 0.44–1.00)
GFR, Est AFR Am: >60 mL/min (ref >60)
GFR, Estimated: >60 mL/min (ref >60)
GLUCOSE: 469 mg/dL — AB (ref 70–99)
POTASSIUM: 4.6 mmol/L (ref 3.5–5.1)
Sodium: 132 mmol/L — ABNORMAL LOW (ref 135–145)
TOTAL PROTEIN: 6.3 g/dL — AB (ref 6.5–8.1)

## 2017-11-17 LAB — ABO/RH: ABO/RH(D): O POS

## 2017-11-17 MED ORDER — HEPARIN SOD (PORK) LOCK FLUSH 100 UNIT/ML IV SOLN
250.0000 [IU] | Freq: Once | INTRAVENOUS | Status: DC
  Administered 2017-11-17: 500 [IU]
  Filled 2017-11-17: qty 5

## 2017-11-17 MED ORDER — SODIUM CHLORIDE 0.9% FLUSH
10.0000 mL | Freq: Once | INTRAVENOUS | Status: AC
  Administered 2017-11-17: 10 mL
  Filled 2017-11-17: qty 10

## 2017-11-17 NOTE — Telephone Encounter (Signed)
Added [facility] visit for today. Date/time/pt aware per 8/12 schedule message.

## 2017-11-17 NOTE — Patient Instructions (Signed)
Implanted Port Home Guide An implanted port is a type of central line that is placed under the skin. Central lines are used to provide IV access when treatment or nutrition needs to be given through a person's veins. Implanted ports are used for long-term IV access. An implanted port may be placed because:  You need IV medicine that would be irritating to the small veins in your hands or arms.  You need long-term IV medicines, such as antibiotics.  You need IV nutrition for a long period.  You need frequent blood draws for lab tests.  You need dialysis.  Implanted ports are usually placed in the chest area, but they can also be placed in the upper arm, the abdomen, or the leg. An implanted port has two main parts:  Reservoir. The reservoir is round and will appear as a small, raised area under your skin. The reservoir is the part where a needle is inserted to give medicines or draw blood.  Catheter. The catheter is a thin, flexible tube that extends from the reservoir. The catheter is placed into a large vein. Medicine that is inserted into the reservoir goes into the catheter and then into the vein.  How will I care for my incision site? Do not get the incision site wet. Bathe or shower as directed by your health care provider. How is my port accessed? Special steps must be taken to access the port:  Before the port is accessed, a numbing cream can be placed on the skin. This helps numb the skin over the port site.  Your health care provider uses a sterile technique to access the port. ? Your health care provider must put on a mask and sterile gloves. ? The skin over your port is cleaned carefully with an antiseptic and allowed to dry. ? The port is gently pinched between sterile gloves, and a needle is inserted into the port.  Only "non-coring" port needles should be used to access the port. Once the port is accessed, a blood return should be checked. This helps ensure that the port  is in the vein and is not clogged.  If your port needs to remain accessed for a constant infusion, a clear (transparent) bandage will be placed over the needle site. The bandage and needle will need to be changed every week, or as directed by your health care provider.  Keep the bandage covering the needle clean and dry. Do not get it wet. Follow your health care provider's instructions on how to take a shower or bath while the port is accessed.  If your port does not need to stay accessed, no bandage is needed over the port.  What is flushing? Flushing helps keep the port from getting clogged. Follow your health care provider's instructions on how and when to flush the port. Ports are usually flushed with saline solution or a medicine called heparin. The need for flushing will depend on how the port is used.  If the port is used for intermittent medicines or blood draws, the port will need to be flushed: ? After medicines have been given. ? After blood has been drawn. ? As part of routine maintenance.  If a constant infusion is running, the port may not need to be flushed.  How long will my port stay implanted? The port can stay in for as long as your health care provider thinks it is needed. When it is time for the port to come out, surgery will be   done to remove it. The procedure is similar to the one performed when the port was put in. When should I seek immediate medical care? When you have an implanted port, you should seek immediate medical care if:  You notice a bad smell coming from the incision site.  You have swelling, redness, or drainage at the incision site.  You have more swelling or pain at the port site or the surrounding area.  You have a fever that is not controlled with medicine.  This information is not intended to replace advice given to you by your health care provider. Make sure you discuss any questions you have with your health care provider. Document  Released: 03/25/2005 Document Revised: 08/31/2015 Document Reviewed: 11/30/2012 Elsevier Interactive Patient Education  2017 [employer]  

## 2017-11-17 NOTE — Telephone Encounter (Signed)
Scheduled appt with Andria Frames - patient care center - gave patient AVS and calender per los.

## 2017-11-18 LAB — PREPARE RBC (CROSSMATCH)

## 2017-11-19 ENCOUNTER — Ambulatory Visit (HOSPITAL_COMMUNITY)
Admission: RE | Admit: 2017-11-19 | Discharge: 2017-11-19 | Disposition: A | Discharge status: Home / Self Care | Payer: Medicare Other | Source: Ambulatory Visit | Attending: Medical | Admitting: Medical

## 2017-11-19 DIAGNOSIS — D649 Anemia, unspecified: Secondary | ICD-10-CM | POA: Diagnosis not present

## 2017-11-19 MED ORDER — ACETAMINOPHEN 325 MG PO TABS
650.0000 mg | ORAL_TABLET | Freq: Once | ORAL | Status: AC
  Administered 2017-11-19: 650 mg via ORAL
  Filled 2017-11-19: qty 2

## 2017-11-19 MED ORDER — DIPHENHYDRAMINE HCL 25 MG PO CAPS
25.0000 mg | ORAL_CAPSULE | Freq: Once | ORAL | Status: AC
  Administered 2017-11-19: 25 mg via ORAL
  Filled 2017-11-19: qty 1

## 2017-11-19 MED ORDER — HEPARIN SOD (PORK) LOCK FLUSH 100 UNIT/ML IV SOLN
500.0000 [IU] | Freq: Every day | INTRAVENOUS | Status: AC | PRN
  Administered 2017-11-19: 500 [IU]
  Filled 2017-11-19: qty 5

## 2017-11-19 MED ORDER — FUROSEMIDE 20 MG PO TABS
20.0000 mg | ORAL_TABLET | Freq: Once | ORAL | Status: AC
  Administered 2017-11-19: 20 mg via ORAL
  Filled 2017-11-19: qty 1

## 2017-11-19 NOTE — Progress Notes (Signed)
These preliminary result these preliminary results were noted.  Awaiting final report.

## 2017-11-19 NOTE — Progress Notes (Signed)
             Patient Care Center Note   Diagnosis: Anemia  Provider: Dr. Satira Sark  Procedure: 2 Units of PRBC  Note: Patient came into [facility] [place] received 2 units of PRBC via port.  Tolerate well. Lasix administered, patient void several times. At conclusion of transfusion blood pressure remained elevated. Dr. Satira Sark notified, verbal orders ok to discharge, tell patient to take blood pressure medication when she gets home. Patient verbalized understanding. Patient alert, oriented  and ambulatory at time of discharge.   Otho Bellows, RN

## 2017-11-19 NOTE — Progress Notes (Signed)
Patient pre blood pressure, 156/56 and post 15 min blood pressure, concern about 2 units scheduled to be infused will cause blood pressure to go higher.  [place] notified, received call back with verbal orders for lasix 20 mg PO between units. Patient educated and understanding verbalized.

## 2017-11-19 NOTE — Progress Notes (Signed)
[facility] Progress Note   JANAUTICA NETZLEY [id number] January 15, 1941 77 y.o.  XITLALI KASTENS is managed by Dr. Fanny Bien. Mohamed  Actively treated with chemotherapy/immunotherapy: yes  Current Therapy: Carboplatin and etoposide with PEG filgrastim support  Last Treated: 11/10/2017 (cycle 6) with PEG filgrastim dosed on 11/12/2017.  Assessment: Plan:    Symptomatic anemia - Plan: Type and screen, DISCONTINUED: heparin lock flush 100 unit/mL, DISCONTINUED: acetaminophen (TYLENOL) tablet 650 mg, DISCONTINUED: diphenhydrAMINE (BENADRYL) capsule 25 mg, CANCELED: Practitioner attestation of consent, CANCELED: Complete patient signature process for consent form, CANCELED: Care order/instruction, CANCELED: Prepare RBC, CANCELED: Transfuse RBC  Primary small cell carcinoma of left lung (HCC)   Symptomatic anemia: CBC returned today showing a hemoglobin of 7.8 and a hematocrit of 23.7.  The patient was referred for a transfusion of 2 units of packed red blood cells.  Primary small cell lung cancer: The patient is status post cycle 6 of carboplatin and etoposide with PEG filgrastim support.  She is scheduled to see Dr. Julien Nordmann in follow-up on 12/03/2017.  Please see After Visit Summary for patient specific instructions.  Future Appointments  Date Time Provider [place]  11/24/2017  3:00 PM [facility] LAB 1 [facility] None  11/24/2017  3:15 PM [facility] [place] FLUSH [facility] None  12/01/2017 11:30 AM [facility] 2 [facility] [place]  12/03/2017  8:00 AM [facility] LAB 1 [facility] None  12/03/2017  8:15 AM [facility] INFUSION [facility] None  12/03/2017  8:45 AM Curt Bears, MD [facility] None    Orders Placed This Encounter  Procedures  . Type and screen       Subjective:   Patient ID:  TARSHIA KOT is a 77 y.o. (DOB [birth year]) female.  Chief Complaint:  Chief Complaint  Patient presents with  . Fatigue    HPI DODY SMARTT is a 77 year old woman with  a history of an extensive stage small cell lung cancer who is managed by Dr. Fanny Bien. Mohamed and is status post cycle 6 of carboplatin and etoposide given with Neulasta support.  She 11/10/2017 (cycle 6) with Neulasta dosed on 11/12/2017.  She presents to the office today with her daughter.  She is having weakness, dizziness, fatigue and increased somnolence.  She is eating well and drinking well.  She denies chest pain, shortness of breath, nausea, vomiting, diarrhea, fevers, chills, or sweats.  Labs were collected today which returned showing a WBC of 22.3, hemoglobin of 7.8, hematocrit of 23.7, and platelet count of 71,000.   Medications: I have reviewed the patient's current medications.  Allergies:  Allergies  Allergen Reactions  . Codeine Other (See Comments)    "gives me a migraine" (03/11/2012)  . Morphine And Related Other (See Comments)    "gives me a migraine" (03/11/2012)    Past Medical History:  Diagnosis Date  . Arthritis    "in my fingers"   . CAD (coronary artery disease)    a. MI 1999 with 2 stents at that time followed by stenting 3-4 yrs later. b. s/p DES to LAD guided by pressure wire 03/11/12.  . Colon polyps    adenomatous  . Depression   . Diabetes mellitus, type 2 ([place])   . Hyperlipemia   . Hypertension   . Osteoarthritis   . Pneumonia 06/2017  . Skin cancer 2012   "nose; right middle finger"  . Stroke [facility])     Past Surgical History:  Procedure Laterality Date  . APPENDECTOMY  ~ 1965  . CATARACT  EXTRACTION    . CORONARY ANGIOPLASTY WITH STENT PLACEMENT  1999; 2004; 03/11/2012   "2 + 1 + 1; total of 4" (03/11/2012)  . FEMUR FRACTURE SURGERY  2011   RLE (03/11/2012)  . IR FLUORO GUIDE PORT INSERTION RIGHT  07/18/2017  . IR US GUIDE VASC ACCESS RIGHT  07/18/2017  . PERCUTANEOUS CORONARY STENT INTERVENTION (PCI-S) N/A 03/11/2012   Procedure: PERCUTANEOUS CORONARY STENT INTERVENTION (PCI-S);  Surgeon: Sherren Mocha, MD;  Location: [facility] CATH LAB;  Service:  Cardiovascular;  Laterality: N/A;  . SKIN CANCER EXCISION  2013   "nose & right middle finger; actinic keratosis" (03/11/2012)  . TONSILLECTOMY AND ADENOIDECTOMY  ~ 1962  . TUBAL LIGATION  1977  . VIDEO BRONCHOSCOPY WITH ENDOBRONCHIAL ULTRASOUND N/A 07/04/2017   Procedure: VIDEO BRONCHOSCOPY WITH ENDOBRONCHIAL ULTRASOUND;  Surgeon: Collene Gobble, MD;  Location: [facility] OR;  Service: Thoracic;  Laterality: N/A;    Family History  Problem Relation Age of Onset  . Liver cancer Mother   . Brain cancer Father   . Stomach cancer Father   . Colon cancer Brother 67  . Lung cancer Sister   . Lung cancer Brother   . Stomach cancer Brother   . Diabetes Daughter     Social History   Socioeconomic History  . Marital status: Widowed    Spouse name: Not on file  . Number of children: 3  . Years of education: Not on file  . Highest education level: Not on file  Occupational History  . Occupation: Retired  Scientific laboratory technician  . Financial resource strain: Not on file  . Food insecurity:    Worry: Not on file    Inability: Not on file  . Transportation needs:    Medical: Not on file    Non-medical: Not on file  Tobacco Use  . Smoking status: Current Every Day Smoker    Packs/day: 0.50    Years: 51.00    Pack years: 25.50    Types: Cigarettes  . Smokeless tobacco: Never Used  Substance and Sexual Activity  . Alcohol use: No  . Drug use: No  . Sexual activity: Never  Lifestyle  . Physical activity:    Days per week: Not on file    Minutes per session: Not on file  . Stress: Not on file  Relationships  . Social connections:    Talks on phone: Not on file    Gets together: Not on file    Attends religious service: Not on file    Active member of club or organization: Not on file    Attends meetings of clubs or organizations: Not on file    Relationship status: Not on file  . Intimate partner violence:    Fear of current or ex partner: Not on file    Emotionally abused: Not on file     Physically abused: Not on file    Forced sexual activity: Not on file  Other Topics Concern  . Not on file  Social History Narrative   Grandson lives with her.      Past Medical History, Surgical history, Social history, and Family history were reviewed and updated as appropriate.   Please see review of systems for further details on the patient's review from today.   Review of Systems:  Review of Systems  Constitutional: Positive for fatigue. Negative for appetite change, chills, diaphoresis and fever.  HENT: Negative for dental problem, mouth sores and trouble swallowing.   Respiratory: Negative for cough,  chest tightness and shortness of breath.   Cardiovascular: Negative for chest pain and palpitations.  Gastrointestinal: Negative for constipation, diarrhea, nausea and vomiting.  Neurological: Positive for dizziness and weakness. Negative for syncope and headaches.  Psychiatric/Behavioral:       Increased somnolence    Objective:   Physical Exam:  BP (!) 181/64 (BP Location: Left Arm, Patient Position: Sitting) Comment: RN Liza aware of BP.  Pulse 77   Temp 98.9 F (37.2 C) (Oral)   Resp 14   Ht 5\' 1"  (1.549 m)   Wt 80 lb 1.6 oz (36.3 kg)   SpO2 100%   BMI 15.13 kg/m  ECOG: 1  Physical Exam  Constitutional: No distress.  HENT:  Head: Normocephalic and atraumatic.  Cardiovascular: Normal rate, regular rhythm and normal heart sounds. Exam reveals no gallop and no friction rub.  No murmur heard. Pulmonary/Chest: Effort normal and breath sounds normal. No respiratory distress. She has no wheezes. She has no rales.  Neurological: She is alert.  Skin: Skin is warm and dry. No rash noted. She is not diaphoretic. No erythema. There is pallor.  Psychiatric: She has a normal mood and affect. Her behavior is normal. Judgment and thought content normal.    Lab Review:     Component Value Date/Time   NA 132 (L) 11/17/2017 1333   K 4.6 11/17/2017 1333   CL 97 (L)  11/17/2017 1333   CO2 26 11/17/2017 1333   GLUCOSE 469 (H) 11/17/2017 1333   BUN 20 11/17/2017 1333   CREATININE 0.85 11/17/2017 1333   CALCIUM 8.7 (L) 11/17/2017 1333   PROT 6.3 (L) 11/17/2017 1333   ALBUMIN 3.3 (L) 11/17/2017 1333   AST 12 (L) 11/17/2017 1333   ALT 14 11/17/2017 1333   ALKPHOS 196 (H) 11/17/2017 1333   BILITOT 0.3 11/17/2017 1333   GFRNONAA >60 11/17/2017 1333   GFRAA >60 11/17/2017 1333       Component Value Date/Time   WBC 22.3 (H) 11/17/2017 1333   WBC 76.2 (HH) 09/12/2017 1744   RBC 2.25 (L) 11/17/2017 1333   HGB 7.8 (L) 11/17/2017 1333   HCT 23.7 (L) 11/17/2017 1333   HCT 27.3 (L) 08/04/2017 1704   PLT 71 (L) 11/17/2017 1333   MCV 105.3 (H) 11/17/2017 1333   MCH 34.7 (H) 11/17/2017 1333   MCHC 32.9 11/17/2017 1333   RDW 16.9 (H) 11/17/2017 1333   LYMPHSABS 1.6 11/17/2017 1333   MONOABS 0.5 11/17/2017 1333   EOSABS 0.0 11/17/2017 1333   BASOSABS 0.0 11/17/2017 1333   -------------------------------  Imaging from last 24 hours (if applicable):  Radiology interpretation: Dg Chest 2 View  Result Date: 10/28/2017 CLINICAL DATA:  Left upper lobe malignancy. EXAM: CHEST - 2 VIEW COMPARISON:  CT scan of September 25, 2017.  Radiograph of September 12, 2017. FINDINGS: Stable cardiac size. Atherosclerosis of thoracic aorta is noted. Right internal jugular Port-A-Cath is noted with distal tip in expected position of the SVC. No pneumothorax or pleural effusion is noted. Stable appearance of mass seen laterally in the left upper lobe consistent with malignancy. Possible new right suprahilar density is noted. Bony thorax is unremarkable. IMPRESSION: Stable lateral left upper lobe mass is noted consistent with malignancy. Possible new right suprahilar mass is noted, although this may represent artifact due to overlying Port-A-Cath. CT scan may be performed for further evaluation. Aortic Atherosclerosis (ICD10-I70.0). Electronically Signed   By: Marijo Conception, M.D.   On:  10/28/2017 15:02  This case was discussed with Dr. Julien Nordmann. He expressed agreement with my management of this patient.

## 2017-11-19 NOTE — Discharge Instructions (Signed)

## 2017-11-20 LAB — TYPE AND SCREEN
ABO/RH(D): O POS
Antibody Screen: NEGATIVE
UNIT DIVISION: 0
Unit division: 0

## 2017-11-20 LAB — BPAM RBC
Blood Product Expiration Date: 201909122359
Blood Product Expiration Date: 201909122359
ISSUE DATE / TIME: 201908140825
ISSUE DATE / TIME: 201908140825
UNIT TYPE AND RH: 5100
Unit Type and Rh: 5100

## 2017-11-24 ENCOUNTER — Other Ambulatory Visit: Payer: Self-pay | Admitting: Medical Oncology

## 2017-11-24 ENCOUNTER — Inpatient Hospital Stay: Payer: Medicare Other

## 2017-11-24 DIAGNOSIS — D649 Anemia, unspecified: Secondary | ICD-10-CM | POA: Diagnosis not present

## 2017-11-24 DIAGNOSIS — C7951 Secondary malignant neoplasm of bone: Secondary | ICD-10-CM | POA: Diagnosis not present

## 2017-11-24 DIAGNOSIS — Z5111 Encounter for antineoplastic chemotherapy: Secondary | ICD-10-CM | POA: Diagnosis not present

## 2017-11-24 DIAGNOSIS — I10 Essential (primary) hypertension: Secondary | ICD-10-CM | POA: Diagnosis not present

## 2017-11-24 DIAGNOSIS — Z95828 Presence of other vascular implants and grafts: Secondary | ICD-10-CM

## 2017-11-24 DIAGNOSIS — C3492 Malignant neoplasm of unspecified part of left bronchus or lung: Secondary | ICD-10-CM

## 2017-11-24 DIAGNOSIS — C787 Secondary malignant neoplasm of liver and intrahepatic bile duct: Secondary | ICD-10-CM | POA: Diagnosis not present

## 2017-11-24 DIAGNOSIS — C3412 Malignant neoplasm of upper lobe, left bronchus or lung: Secondary | ICD-10-CM | POA: Diagnosis not present

## 2017-11-24 LAB — CMP (CANCER CENTER ONLY)
ALT: 18 U/L (ref 0–44)
AST: 14 U/L — ABNORMAL LOW (ref 15–41)
Albumin: 3.4 g/dL — ABNORMAL LOW (ref 3.5–5.0)
Alkaline Phosphatase: 160 U/L — ABNORMAL HIGH (ref 38–126)
Anion gap: 7 (ref 5–15)
BUN: 16 mg/dL (ref 8–23)
CHLORIDE: 95 mmol/L — AB (ref 98–111)
CO2: 28 mmol/L (ref 22–32)
CREATININE: 0.87 mg/dL (ref 0.44–1.00)
Calcium: 8.8 mg/dL — ABNORMAL LOW (ref 8.9–10.3)
GFR, Est AFR Am: >60 mL/min (ref >60)
Glucose, Bld: 425 mg/dL — ABNORMAL HIGH (ref 70–99)
Potassium: 4.5 mmol/L (ref 3.5–5.1)
Sodium: 130 mmol/L — ABNORMAL LOW (ref 135–145)
Total Bilirubin: 0.3 mg/dL (ref 0.3–1.2)
Total Protein: 6.6 g/dL (ref 6.5–8.1)

## 2017-11-24 LAB — CBC WITH DIFFERENTIAL (CANCER CENTER ONLY)
BASOS PCT: 0 %
Basophils Absolute: 0 10*3/uL (ref 0.0–0.1)
EOS ABS: 0 10*3/uL (ref 0.0–0.5)
Eosinophils Relative: 0 %
HCT: 36.5 % (ref 34.8–46.6)
Hemoglobin: 12.2 g/dL (ref 11.6–15.9)
LYMPHS ABS: 1.4 10*3/uL (ref 0.9–3.3)
Lymphocytes Relative: 14 %
MCH: 32 pg (ref 25.1–34.0)
MCHC: 33.4 g/dL (ref 31.5–36.0)
MCV: 95.8 fL (ref 79.5–101.0)
MONOS PCT: 6 %
Monocytes Absolute: 0.6 10*3/uL (ref 0.1–0.9)
Neutro Abs: 8 10*3/uL — ABNORMAL HIGH (ref 1.5–6.5)
Neutrophils Relative %: 80 %
PLATELETS: 40 10*3/uL — AB (ref 145–400)
RBC: 3.81 MIL/uL (ref 3.70–5.45)
RDW: 19.1 % — ABNORMAL HIGH (ref 11.2–14.5)
WBC: 10.1 10*3/uL (ref 3.9–10.3)

## 2017-11-24 MED ORDER — LIDOCAINE-PRILOCAINE 2.5-2.5 % EX CREA: 1.0000 "application " | TOPICAL_CREAM | CUTANEOUS | 0 refills | Status: AC | PRN

## 2017-11-24 MED ORDER — HEPARIN SOD (PORK) LOCK FLUSH 100 UNIT/ML IV SOLN
500.0000 [IU] | Freq: Once | INTRAVENOUS | Status: AC
  Administered 2017-11-24: 500 [IU]
  Filled 2017-11-24: qty 5

## 2017-11-24 MED ORDER — SODIUM CHLORIDE 0.9% FLUSH
10.0000 mL | Freq: Once | INTRAVENOUS | Status: AC
  Administered 2017-11-24: 10 mL
  Filled 2017-11-24: qty 10

## 2017-12-01 ENCOUNTER — Other Ambulatory Visit: Payer: Medicare Other

## 2017-12-01 ENCOUNTER — Ambulatory Visit (HOSPITAL_COMMUNITY)
Admission: RE | Admit: 2017-12-01 | Discharge: 2017-12-01 | Disposition: A | Discharge status: Home / Self Care | Payer: Medicare Other | Source: Ambulatory Visit | Attending: Oncology | Admitting: Oncology

## 2017-12-01 DIAGNOSIS — I2510 Atherosclerotic heart disease of native coronary artery without angina pectoris: Secondary | ICD-10-CM | POA: Diagnosis not present

## 2017-12-01 DIAGNOSIS — C3492 Malignant neoplasm of unspecified part of left bronchus or lung: Secondary | ICD-10-CM | POA: Diagnosis not present

## 2017-12-01 DIAGNOSIS — N289 Disorder of kidney and ureter, unspecified: Secondary | ICD-10-CM | POA: Diagnosis not present

## 2017-12-01 DIAGNOSIS — R591 Generalized enlarged lymph nodes: Secondary | ICD-10-CM | POA: Diagnosis not present

## 2017-12-01 DIAGNOSIS — I700 Atherosclerosis of aorta: Secondary | ICD-10-CM | POA: Diagnosis not present

## 2017-12-01 DIAGNOSIS — Z85118 Personal history of other malignant neoplasm of bronchus and lung: Secondary | ICD-10-CM | POA: Diagnosis not present

## 2017-12-01 DIAGNOSIS — K769 Liver disease, unspecified: Secondary | ICD-10-CM | POA: Diagnosis not present

## 2017-12-01 MED ORDER — IOHEXOL 300 MG/ML  SOLN
100.0000 mL | Freq: Once | INTRAMUSCULAR | Status: AC | PRN
  Administered 2017-12-01: 80 mL via INTRAVENOUS

## 2017-12-03 ENCOUNTER — Encounter: Payer: Self-pay | Admitting: Internal Medicine

## 2017-12-03 ENCOUNTER — Inpatient Hospital Stay: Payer: Medicare Other

## 2017-12-03 ENCOUNTER — Inpatient Hospital Stay (HOSPITAL_BASED_OUTPATIENT_CLINIC_OR_DEPARTMENT_OTHER): Payer: Medicare Other | Admitting: Internal Medicine

## 2017-12-03 VITALS — BP 179/69 | HR 62 | Temp 97.5°F | Resp 18 | Ht 61.0 in | Wt 80.5 lb

## 2017-12-03 DIAGNOSIS — C3412 Malignant neoplasm of upper lobe, left bronchus or lung: Secondary | ICD-10-CM | POA: Diagnosis not present

## 2017-12-03 DIAGNOSIS — C7951 Secondary malignant neoplasm of bone: Secondary | ICD-10-CM | POA: Diagnosis not present

## 2017-12-03 DIAGNOSIS — C3492 Malignant neoplasm of unspecified part of left bronchus or lung: Secondary | ICD-10-CM

## 2017-12-03 DIAGNOSIS — I10 Essential (primary) hypertension: Secondary | ICD-10-CM

## 2017-12-03 DIAGNOSIS — D649 Anemia, unspecified: Secondary | ICD-10-CM | POA: Diagnosis not present

## 2017-12-03 DIAGNOSIS — Z5112 Encounter for antineoplastic immunotherapy: Secondary | ICD-10-CM | POA: Insufficient documentation

## 2017-12-03 DIAGNOSIS — C787 Secondary malignant neoplasm of liver and intrahepatic bile duct: Secondary | ICD-10-CM | POA: Diagnosis not present

## 2017-12-03 DIAGNOSIS — R5382 Chronic fatigue, unspecified: Secondary | ICD-10-CM

## 2017-12-03 DIAGNOSIS — Z5111 Encounter for antineoplastic chemotherapy: Secondary | ICD-10-CM | POA: Diagnosis not present

## 2017-12-03 DIAGNOSIS — Z95828 Presence of other vascular implants and grafts: Secondary | ICD-10-CM

## 2017-12-03 DIAGNOSIS — Z7189 Other specified counseling: Secondary | ICD-10-CM

## 2017-12-03 LAB — CBC WITH DIFFERENTIAL (CANCER CENTER ONLY)
Basophils Absolute: 0 10*3/uL (ref 0.0–0.1)
Basophils Relative: 1 %
EOS PCT: 0 %
Eosinophils Absolute: 0 10*3/uL (ref 0.0–0.5)
HEMATOCRIT: 35.1 % (ref 34.8–46.6)
Hemoglobin: 11.9 g/dL (ref 11.6–15.9)
LYMPHS ABS: 1.3 10*3/uL (ref 0.9–3.3)
LYMPHS PCT: 19 %
MCH: 32.5 pg (ref 25.1–34.0)
MCHC: 33.9 g/dL (ref 31.5–36.0)
MCV: 95.7 fL (ref 79.5–101.0)
MONO ABS: 0.6 10*3/uL (ref 0.1–0.9)
MONOS PCT: 8 %
NEUTROS ABS: 5.1 10*3/uL (ref 1.5–6.5)
Neutrophils Relative %: 72 %
PLATELETS: 118 10*3/uL — AB (ref 145–400)
RBC: 3.67 MIL/uL — ABNORMAL LOW (ref 3.70–5.45)
RDW: 21.3 % — AB (ref 11.2–14.5)
WBC Count: 7 10*3/uL (ref 3.9–10.3)

## 2017-12-03 LAB — CMP (CANCER CENTER ONLY)
ALBUMIN: 3.5 g/dL (ref 3.5–5.0)
ALT: 23 U/L (ref 0–44)
AST: 23 U/L (ref 15–41)
Alkaline Phosphatase: 163 U/L — ABNORMAL HIGH (ref 38–126)
Anion gap: 7 (ref 5–15)
BILIRUBIN TOTAL: 0.3 mg/dL (ref 0.3–1.2)
BUN: 12 mg/dL (ref 8–23)
CHLORIDE: 97 mmol/L — AB (ref 98–111)
CO2: 27 mmol/L (ref 22–32)
Calcium: 9.2 mg/dL (ref 8.9–10.3)
Creatinine: 0.76 mg/dL (ref 0.44–1.00)
GFR, Est AFR Am: >60 mL/min (ref >60)
GFR, Estimated: >60 mL/min (ref >60)
GLUCOSE: 142 mg/dL — AB (ref 70–99)
POTASSIUM: 4.4 mmol/L (ref 3.5–5.1)
SODIUM: 131 mmol/L — AB (ref 135–145)
TOTAL PROTEIN: 6.6 g/dL (ref 6.5–8.1)

## 2017-12-03 MED ORDER — SODIUM CHLORIDE 0.9% FLUSH
10.0000 mL | Freq: Once | INTRAVENOUS | Status: AC
  Administered 2017-12-03: 10 mL
  Filled 2017-12-03: qty 10

## 2017-12-03 MED ORDER — HEPARIN SOD (PORK) LOCK FLUSH 100 UNIT/ML IV SOLN
500.0000 [IU] | Freq: Once | INTRAVENOUS | Status: AC
  Administered 2017-12-03: 500 [IU]
  Filled 2017-12-03: qty 5

## 2017-12-03 NOTE — Patient Instructions (Signed)
Steps to Quit Smoking Smoking tobacco can be bad for your health. It can also affect almost every organ in your body. Smoking puts you and people around you at risk for many serious long-lasting (chronic) diseases. Quitting smoking is hard, but it is one of the best things that you can do for your health. It is never too late to quit. What are the benefits of quitting smoking? When you quit smoking, you lower your risk for getting serious diseases and conditions. They can include:  Lung cancer or lung disease.  Heart disease.  Stroke.  Heart attack.  Not being able to have children (infertility).  Weak bones (osteoporosis) and broken bones (fractures).  If you have coughing, wheezing, and shortness of breath, those symptoms may get better when you quit. You may also get sick less often. If you are pregnant, quitting smoking can help to lower your chances of having a baby of low birth weight. What can I do to help me quit smoking? Talk with your doctor about what can help you quit smoking. Some things you can do (strategies) include:  Quitting smoking totally, instead of slowly cutting back how much you smoke over a period of time.  Going to in-person counseling. You are more likely to quit if you go to many counseling sessions.  Using resources and support systems, such as: ? Online chats with a counselor. ? Phone quitlines. ? Printed self-help materials. ? Support groups or group counseling. ? Text messaging programs. ? Mobile phone apps or applications.  Taking medicines. Some of these medicines may have nicotine in them. If you are pregnant or breastfeeding, do not take any medicines to quit smoking unless your doctor says it is okay. Talk with your doctor about counseling or other things that can help you.  Talk with your doctor about using more than one strategy at the same time, such as taking medicines while you are also going to in-person counseling. This can help make  quitting easier. What things can I do to make it easier to quit? Quitting smoking might feel very hard at first, but there is a lot that you can do to make it easier. Take these steps:  Talk to your family and friends. Ask them to support and encourage you.  Call phone quitlines, reach out to support groups, or work with a counselor.  Ask people who smoke to not smoke around you.  Avoid places that make you want (trigger) to smoke, such as: ? Bars. ? Parties. ? Smoke-break areas at work.  Spend time with people who do not smoke.  Lower the stress in your life. Stress can make you want to smoke. Try these things to help your stress: ? Getting regular exercise. ? Deep-breathing exercises. ? Yoga. ? Meditating. ? Doing a body scan. To do this, close your eyes, focus on one area of your body at a time from head to toe, and notice which parts of your body are tense. Try to relax the muscles in those areas.  Download or buy apps on your mobile phone or tablet that can help you stick to your quit plan. There are many free apps, such as QuitGuide from the CDC (Centers for Disease Control and Prevention). You can find more support from smokefree.gov and other websites.  This information is not intended to replace advice given to you by your health care provider. Make sure you discuss any questions you have with your health care provider. Document Released: 01/19/2009 Document   Revised: 11/21/2015 Document Reviewed: 08/09/2014 Elsevier Interactive Patient Education  2018 [employer].  

## 2017-12-03 NOTE — Progress Notes (Signed)
[place] [phone]) [phone]   Fax:([phone]) [phone]  OFFICE PROGRESS NOTE  Prince Solian, MD [place] [address]  DIAGNOSIS: Extensive stage (T2a, N2, M1c) small cell lung cancer presented with subpleural mass within the left upper lobe with associated ipsilateral hilar and mediastinal nodal metastasis, liver metastasis and bone metastasis  PRIOR THERAPY:None  CURRENT THERAPY: Systemic chemotherapy with carboplatin for AUC of 5 on day 1 and etoposide 100 mg/M2 on days 1, 2 and 3 with Neulasta support every 3 weeks.  Status post 6 cycles, last dose was given 11/10/2017.  INTERVAL HISTORY: Katherine Cole 77 y.o. female returns to the clinic today for follow-up visit accompanied by her daughter.  The patient is feeling fine today with no concerning complaints.  She gained few pounds since her last visit.  She denied having any nausea, vomiting, diarrhea or constipation.  She has no fever or chills.  She felt much better after the PRBCs transfusion.  She denied having any chest pain, shortness breath, cough or hemoptysis.  She denied having any headache or visual changes.  She completed 6 cycles of systemic chemotherapy with carboplatin and etoposide.  The patient had repeat CT scan of the chest, abdomen and pelvis performed recently and she is here for evaluation and discussion of her risk her results.  MEDICAL HISTORY: Past Medical History:  Diagnosis Date  . Arthritis    "in my fingers"   . CAD (coronary artery disease)    a. MI 1999 with 2 stents at that time followed by stenting 3-4 yrs later. b. s/p DES to LAD guided by pressure wire 03/11/12.  . Colon polyps    adenomatous  . Depression   . Diabetes mellitus, type 2 ([place])   . Hyperlipemia   . Hypertension   . Osteoarthritis   . Pneumonia 06/2017  . Skin cancer 2012   "nose; right middle finger"  . Stroke [facility])     ALLERGIES:  is allergic to codeine and morphine and  related.  MEDICATIONS:  Current Outpatient Medications  Medication Sig Dispense Refill  . ALPRAZolam (XANAX) 0.25 MG tablet Take 0.25 mg by mouth at bedtime as needed for anxiety.    Marland Kitchen amLODipine (NORVASC) 10 MG tablet Take 1 tablet (10 mg total) by mouth daily. 30 tablet 0  . aspirin 81 MG tablet Take 1 tablet (81 mg total) by mouth daily. (Patient taking differently: Take 81 mg by mouth as directed. On mondays and Thursdays) 30 tablet 1  . atorvastatin (LIPITOR) 80 MG tablet Take 80 mg by mouth daily.    . clopidogrel (PLAVIX) 75 MG tablet Take 75 mg by mouth daily.    . fluticasone (FLONASE) 50 MCG/ACT nasal spray PLACE 1 SPRAY INTO BOTH NOSTRILS DAILY. 16 g 2  . HUMALOG KWIKPEN 100 UNIT/ML KiwkPen 0-8 units three times daily after meals  2  . ipratropium-albuterol (DUONEB) 0.5-2.5 (3) MG/3ML SOLN Take 3 mLs by nebulization every 6 (six) hours as needed (Shortness of breath or wheezing). 360 mL 3  . lactose free nutrition (BOOST PLUS) LIQD Take 237 mLs by mouth 3 (three) times daily between meals. 14 Can 0  . lidocaine-prilocaine (EMLA) cream Apply 1 application topically as needed. Apply topically to port site 1-2 hours prior to chemotherapy or labs. Cover with plastic wrap. 30 g 0  . loratadine (CLARITIN) 10 MG tablet Take 10 mg by mouth daily.    . metFORMIN (GLUCOPHAGE) 1000 MG tablet Take 1,000  mg by mouth daily.     . metoprolol succinate (TOPROL-XL) 50 MG 24 hr tablet Take 50 mg by mouth daily. Take with or immediately following a meal.    . mirtazapine (REMERON) 15 MG tablet Take 15 mg by mouth at bedtime.    . sitaGLIPtin (JANUVIA) 100 MG tablet Take 0.5 tablets (50 mg total) by mouth daily. 14 tablet 0  . sodium chloride (OCEAN) 0.65 % SOLN nasal spray Place 1 spray into both nostrils as needed for congestion (use twice daily for one week post discharge, then as needed).  0  . guaiFENesin (MUCINEX) 600 MG 12 hr tablet Take 2 tablets (1,200 mg total) by mouth 2 (two) times daily as  needed (Use for one week post discharge, then twice daily as needed). (Patient not taking: Reported on 12/03/2017)    . nitroGLYCERIN (NITROSTAT) 0.4 MG SL tablet Place 1 tablet (0.4 mg total) under the tongue every 5 (five) minutes as needed (up to 3 doses). For chest pain (Patient not taking: Reported on 12/03/2017)    . ondansetron (ZOFRAN ODT) 4 MG disintegrating tablet Take 1 tablet (4 mg total) by mouth every 8 (eight) hours as needed for nausea or vomiting. (Patient not taking: Reported on 12/03/2017) 20 tablet 3  . prochlorperazine (COMPAZINE) 10 MG tablet Take 1 tablet (10 mg total) by mouth every 6 (six) hours as needed for nausea or vomiting. (Patient not taking: Reported on 12/03/2017) 30 tablet 0  . promethazine (PHENERGAN) 12.5 MG tablet Take 1 tablet (12.5 mg total) by mouth every 4 (four) hours as needed for nausea or vomiting. (Patient not taking: Reported on 12/03/2017) 20 tablet 0   No current facility-administered medications for this visit.     SURGICAL HISTORY:  Past Surgical History:  Procedure Laterality Date  . APPENDECTOMY  ~ 1965  . CATARACT EXTRACTION    . CORONARY ANGIOPLASTY WITH STENT PLACEMENT  1999; 2004; 03/11/2012   "2 + 1 + 1; total of 4" (03/11/2012)  . FEMUR FRACTURE SURGERY  2011   RLE (03/11/2012)  . IR FLUORO GUIDE PORT INSERTION RIGHT  07/18/2017  . IR US GUIDE VASC ACCESS RIGHT  07/18/2017  . PERCUTANEOUS CORONARY STENT INTERVENTION (PCI-S) N/A 03/11/2012   Procedure: PERCUTANEOUS CORONARY STENT INTERVENTION (PCI-S);  Surgeon: Sherren Mocha, MD;  Location: [facility] CATH LAB;  Service: Cardiovascular;  Laterality: N/A;  . SKIN CANCER EXCISION  2013   "nose & right middle finger; actinic keratosis" (03/11/2012)  . TONSILLECTOMY AND ADENOIDECTOMY  ~ 1962  . TUBAL LIGATION  1977  . VIDEO BRONCHOSCOPY WITH ENDOBRONCHIAL ULTRASOUND N/A 07/04/2017   Procedure: VIDEO BRONCHOSCOPY WITH ENDOBRONCHIAL ULTRASOUND;  Surgeon: Collene Gobble, MD;  Location: [facility] OR;  Service:  Thoracic;  Laterality: N/A;    REVIEW OF SYSTEMS:  Constitutional: positive for fatigue Eyes: negative Ears, nose, mouth, throat, and face: negative Respiratory: negative Cardiovascular: negative Gastrointestinal: negative Genitourinary:negative Integument/breast: negative Hematologic/lymphatic: negative Musculoskeletal:positive for muscle weakness Neurological: negative Behavioral/Psych: negative Endocrine: negative Allergic/Immunologic: negative   PHYSICAL EXAMINATION: General appearance: alert, cooperative, fatigued and no distress Head: Normocephalic, without obvious abnormality, atraumatic Neck: no adenopathy, no JVD, supple, symmetrical, trachea midline and thyroid not enlarged, symmetric, no tenderness/mass/nodules Lymph nodes: Cervical, supraclavicular, and axillary nodes normal. Resp: clear to auscultation bilaterally Back: symmetric, no curvature. ROM normal. No CVA tenderness. Cardio: regular rate and rhythm, S1, S2 normal, no murmur, click, rub or gallop GI: soft, non-tender; bowel sounds normal; no masses,  no organomegaly Extremities: extremities normal, atraumatic, no  cyanosis or edema Neurologic: Alert and oriented X 3, normal strength and tone. Normal symmetric reflexes. Normal coordination and gait  ECOG PERFORMANCE STATUS: 1 - Symptomatic but completely ambulatory  Blood pressure (!) 179/69, pulse 62, temperature (!) 97.5 F (36.4 C), temperature source Oral, resp. rate 18, height 5\' 1"  (1.549 m), weight 80 lb 8 oz (36.5 kg), SpO2 100 %.  LABORATORY DATA: Lab Results  Component Value Date   WBC 7.0 12/03/2017   HGB 11.9 12/03/2017   HCT 35.1 12/03/2017   MCV 95.7 12/03/2017   PLT 118 (L) 12/03/2017      Chemistry      Component Value Date/Time   NA 130 (L) 11/24/2017 1351   K 4.5 11/24/2017 1351   CL 95 (L) 11/24/2017 1351   CO2 28 11/24/2017 1351   BUN 16 11/24/2017 1351   CREATININE 0.87 11/24/2017 1351      Component Value Date/Time    CALCIUM 8.8 (L) 11/24/2017 1351   ALKPHOS 160 (H) 11/24/2017 1351   AST 14 (L) 11/24/2017 1351   ALT 18 11/24/2017 1351   BILITOT 0.3 11/24/2017 1351       RADIOGRAPHIC STUDIES: Ct Chest W Contrast  Result Date: 12/01/2017 CLINICAL DATA:  77 year old female with history of left-sided lung cancer diagnosed in March 2019. Chemotherapy completed in August 2019. Restaging examination. EXAM: CT CHEST, ABDOMEN, AND PELVIS WITH CONTRAST TECHNIQUE: Multidetector CT imaging of the chest, abdomen and pelvis was performed following the standard protocol during bolus administration of intravenous contrast. CONTRAST:  56mL OMNIPAQUE IOHEXOL 300 MG/ML  SOLN COMPARISON:  Chest CT 09/25/2017. FINDINGS: CT CHEST FINDINGS Cardiovascular: Heart size is normal. There is no significant pericardial fluid, thickening or pericardial calcification. There is aortic atherosclerosis, as well as atherosclerosis of the great vessels of the mediastinum and the coronary arteries, including calcified atherosclerotic plaque in the left main, left anterior descending, left circumflex and right coronary arteries. Thickening calcification of the aortic valve. Mediastinum/Nodes: Worsening AP window lymphadenopathy measuring 19 mm in short axis. Worsening left hilar lymphadenopathy, best appreciated on axial image 27 of series 2 where there is extensive amorphous soft tissue. The largest left hilar lymph node or conglomeration of lymph nodes measures 1.7 cm in short axis (axial image 28 of series 2). Esophagus is unremarkable in appearance. No axillary lymphadenopathy. Right internal jugular single-lumen porta cath with tip terminating at the superior cavoatrial junction. Lungs/Pleura: Previously noted left upper lobe nodule has increased in size compared to the prior study, currently a pleural-based mass measuring 2.0 x 3.4 x 3.4 cm, with increasing macrolobulated and slightly spiculated margins (axial image 45 of series 6 and coronal image  46 of series 4). Previously noted elongated nodule extending through the left upper lobe toward the left hilum is stable in size currently measuring 3.0 x 0.9 cm (axial image 55 of series 6), unchanged. Musculoskeletal: There are no aggressive appearing lytic or blastic lesions noted in the visualized portions of the skeleton. CT ABDOMEN PELVIS FINDINGS Hepatobiliary: New 10 x 14 x 10 mm hypovascular lesion in the superior aspect of segment 2 of the liver, highly concerning for a metastatic lesion (axial image 46 of series 2 and coronal image 31 of series 4). No other suspicious cystic or solid hepatic lesions. No intra or extrahepatic biliary ductal dilatation. Gallbladder is normal in appearance. Pancreas: No pancreatic mass. No pancreatic ductal dilatation. No pancreatic or peripancreatic fluid or inflammatory changes. Spleen: Unremarkable. Adrenals/Urinary Tract: Subcentimeter low-attenuation lesion in the upper pole the  left kidney, too small to characterize, but statistically likely a tiny cyst. Right kidney and bilateral adrenal glands are normal in appearance. No hydroureteronephrosis. Urinary bladder is normal in appearance. Stomach/Bowel: Normal appearance of the stomach. No pathologic dilatation of small bowel or colon. The appendix is not confidently identified and may be surgically absent. Regardless, there are no inflammatory changes noted adjacent to the cecum to suggest the presence of an acute appendicitis at this time. Vascular/Lymphatic: Aortic atherosclerosis with fusiform aneurysmal dilatation of the infrarenal abdominal aorta which measures up to 3.2 x 2.8 cm, with extensive mural thrombus and/or atheromatous plaque. High-grade stenosis of the infrarenal abdominal aorta immediately above the bifurcation. No lymphadenopathy noted in the abdomen or pelvis. Reproductive: Uterus and ovaries are atrophic. Other: No significant volume of ascites.  No pneumoperitoneum. Musculoskeletal: There are no  aggressive appearing lytic or blastic lesions noted in the visualized portions of the skeleton. IMPRESSION: 1. Today's study demonstrates progression of disease, with enlargement of a pleural-based mass in the periphery of the left upper lobe which currently measures 2.0 x 3.4 x 3.4 cm, with progressive left hilar and AP window lymphadenopathy, as well as what appears to be a new hypovascular lesion in segment 2 of the liver which is highly concerning for a solitary hepatic metastasis. This could be better evaluated with MRI of the abdomen with and without IV gadolinium. 2. Aortic atherosclerosis, in addition to left main and 3 vessel coronary artery disease. Assessment for potential risk factor modification, dietary therapy or pharmacologic therapy may be warranted, if clinically indicated. 3. There are calcifications of the aortic valve. Echocardiographic correlation for evaluation of potential valvular dysfunction may be warranted if clinically indicated. 4. Distal incidental findings, as above. Aortic Atherosclerosis (ICD10-I70.0). Electronically Signed   By: Vinnie Langton M.D.   On: 12/01/2017 16:03   Ct Abdomen Pelvis W Contrast  Result Date: 12/01/2017 CLINICAL DATA:  77 year old female with history of left-sided lung cancer diagnosed in March 2019. Chemotherapy completed in August 2019. Restaging examination. EXAM: CT CHEST, ABDOMEN, AND PELVIS WITH CONTRAST TECHNIQUE: Multidetector CT imaging of the chest, abdomen and pelvis was performed following the standard protocol during bolus administration of intravenous contrast. CONTRAST:  53mL OMNIPAQUE IOHEXOL 300 MG/ML  SOLN COMPARISON:  Chest CT 09/25/2017. FINDINGS: CT CHEST FINDINGS Cardiovascular: Heart size is normal. There is no significant pericardial fluid, thickening or pericardial calcification. There is aortic atherosclerosis, as well as atherosclerosis of the great vessels of the mediastinum and the coronary arteries, including calcified  atherosclerotic plaque in the left main, left anterior descending, left circumflex and right coronary arteries. Thickening calcification of the aortic valve. Mediastinum/Nodes: Worsening AP window lymphadenopathy measuring 19 mm in short axis. Worsening left hilar lymphadenopathy, best appreciated on axial image 27 of series 2 where there is extensive amorphous soft tissue. The largest left hilar lymph node or conglomeration of lymph nodes measures 1.7 cm in short axis (axial image 28 of series 2). Esophagus is unremarkable in appearance. No axillary lymphadenopathy. Right internal jugular single-lumen porta cath with tip terminating at the superior cavoatrial junction. Lungs/Pleura: Previously noted left upper lobe nodule has increased in size compared to the prior study, currently a pleural-based mass measuring 2.0 x 3.4 x 3.4 cm, with increasing macrolobulated and slightly spiculated margins (axial image 45 of series 6 and coronal image 46 of series 4). Previously noted elongated nodule extending through the left upper lobe toward the left hilum is stable in size currently measuring 3.0 x 0.9 cm (axial  image 55 of series 6), unchanged. Musculoskeletal: There are no aggressive appearing lytic or blastic lesions noted in the visualized portions of the skeleton. CT ABDOMEN PELVIS FINDINGS Hepatobiliary: New 10 x 14 x 10 mm hypovascular lesion in the superior aspect of segment 2 of the liver, highly concerning for a metastatic lesion (axial image 46 of series 2 and coronal image 31 of series 4). No other suspicious cystic or solid hepatic lesions. No intra or extrahepatic biliary ductal dilatation. Gallbladder is normal in appearance. Pancreas: No pancreatic mass. No pancreatic ductal dilatation. No pancreatic or peripancreatic fluid or inflammatory changes. Spleen: Unremarkable. Adrenals/Urinary Tract: Subcentimeter low-attenuation lesion in the upper pole the left kidney, too small to characterize, but  statistically likely a tiny cyst. Right kidney and bilateral adrenal glands are normal in appearance. No hydroureteronephrosis. Urinary bladder is normal in appearance. Stomach/Bowel: Normal appearance of the stomach. No pathologic dilatation of small bowel or colon. The appendix is not confidently identified and may be surgically absent. Regardless, there are no inflammatory changes noted adjacent to the cecum to suggest the presence of an acute appendicitis at this time. Vascular/Lymphatic: Aortic atherosclerosis with fusiform aneurysmal dilatation of the infrarenal abdominal aorta which measures up to 3.2 x 2.8 cm, with extensive mural thrombus and/or atheromatous plaque. High-grade stenosis of the infrarenal abdominal aorta immediately above the bifurcation. No lymphadenopathy noted in the abdomen or pelvis. Reproductive: Uterus and ovaries are atrophic. Other: No significant volume of ascites.  No pneumoperitoneum. Musculoskeletal: There are no aggressive appearing lytic or blastic lesions noted in the visualized portions of the skeleton. IMPRESSION: 1. Today's study demonstrates progression of disease, with enlargement of a pleural-based mass in the periphery of the left upper lobe which currently measures 2.0 x 3.4 x 3.4 cm, with progressive left hilar and AP window lymphadenopathy, as well as what appears to be a new hypovascular lesion in segment 2 of the liver which is highly concerning for a solitary hepatic metastasis. This could be better evaluated with MRI of the abdomen with and without IV gadolinium. 2. Aortic atherosclerosis, in addition to left main and 3 vessel coronary artery disease. Assessment for potential risk factor modification, dietary therapy or pharmacologic therapy may be warranted, if clinically indicated. 3. There are calcifications of the aortic valve. Echocardiographic correlation for evaluation of potential valvular dysfunction may be warranted if clinically indicated. 4. Distal  incidental findings, as above. Aortic Atherosclerosis (ICD10-I70.0). Electronically Signed   By: Vinnie Langton M.D.   On: 12/01/2017 16:03    ASSESSMENT AND PLAN: This is a very pleasant 77 years old white female recently diagnosed with extensive stage small cell lung cancer and currently undergoing systemic chemotherapy with carboplatin and etoposide status post 6 cycles. She has a rough time with the previous course of chemotherapy with significant pancytopenia as well as chemotherapy-induced anemia requiring PRBCs transfusion.  She is feeling much better today. The patient had repeat CT scan of the chest, abdomen and pelvis performed recently. I personally and independently reviewed the scan images and discussed the result and showed the images to the patient and her daughter today. Unfortunately her scan showed evidence for disease progression. I discussed with the patient other treatment options including palliative care and hospice referral versus consideration of treatment with chemotherapy with Topotecan which I think will be rough on this patient versus consideration of treatment with immunotherapy with single agent nivolumab. After discussion of these options and the adverse effect, the patient would like to consider treatment with Nivolumab.  She will be treated with Nivolumab 240 mg IV every 2 weeks. I discussed with the patient the adverse effect of this treatment including but not limited to immunotherapy mediated skin rash, diarrhea, inflammation of the lung, kidney, liver or other endocrine dysfunction. For hypertension, she was advised to take her blood pressure medication as prescribed and to monitor it closely at home. She is expected to start the first cycle of this treatment in 2 weeks after she spend some time with her daughter in [place]. The patient will come back for follow-up visit at that time. She was advised to call immediately if she has any concerning symptoms in the  interval. The patient voices understanding of current disease status and treatment options and is in agreement with the current care plan.  All questions were answered. The patient knows to call the clinic with any problems, questions or concerns. We can certainly see the patient much sooner if necessary.  Disclaimer: This note was dictated with voice recognition software. Similar sounding words can inadvertently be transcribed and may not be corrected upon review.

## 2017-12-03 NOTE — Patient Instructions (Signed)
Implanted Port Home Guide An implanted port is a type of central line that is placed under the skin. Central lines are used to provide IV access when treatment or nutrition needs to be given through a person's veins. Implanted ports are used for long-term IV access. An implanted port may be placed because:  You need IV medicine that would be irritating to the small veins in your hands or arms.  You need long-term IV medicines, such as antibiotics.  You need IV nutrition for a long period.  You need frequent blood draws for lab tests.  You need dialysis.  Implanted ports are usually placed in the chest area, but they can also be placed in the upper arm, the abdomen, or the leg. An implanted port has two main parts:  Reservoir. The reservoir is round and will appear as a small, raised area under your skin. The reservoir is the part where a needle is inserted to give medicines or draw blood.  Catheter. The catheter is a thin, flexible tube that extends from the reservoir. The catheter is placed into a large vein. Medicine that is inserted into the reservoir goes into the catheter and then into the vein.  How will I care for my incision site? Do not get the incision site wet. Bathe or shower as directed by your health care provider. How is my port accessed? Special steps must be taken to access the port:  Before the port is accessed, a numbing cream can be placed on the skin. This helps numb the skin over the port site.  Your health care provider uses a sterile technique to access the port. ? Your health care provider must put on a mask and sterile gloves. ? The skin over your port is cleaned carefully with an antiseptic and allowed to dry. ? The port is gently pinched between sterile gloves, and a needle is inserted into the port.  Only "non-coring" port needles should be used to access the port. Once the port is accessed, a blood return should be checked. This helps ensure that the port  is in the vein and is not clogged.  If your port needs to remain accessed for a constant infusion, a clear (transparent) bandage will be placed over the needle site. The bandage and needle will need to be changed every week, or as directed by your health care provider.  Keep the bandage covering the needle clean and dry. Do not get it wet. Follow your health care provider's instructions on how to take a shower or bath while the port is accessed.  If your port does not need to stay accessed, no bandage is needed over the port.  What is flushing? Flushing helps keep the port from getting clogged. Follow your health care provider's instructions on how and when to flush the port. Ports are usually flushed with saline solution or a medicine called heparin. The need for flushing will depend on how the port is used.  If the port is used for intermittent medicines or blood draws, the port will need to be flushed: ? After medicines have been given. ? After blood has been drawn. ? As part of routine maintenance.  If a constant infusion is running, the port may not need to be flushed.  How long will my port stay implanted? The port can stay in for as long as your health care provider thinks it is needed. When it is time for the port to come out, surgery will be   done to remove it. The procedure is similar to the one performed when the port was put in. When should I seek immediate medical care? When you have an implanted port, you should seek immediate medical care if:  You notice a bad smell coming from the incision site.  You have swelling, redness, or drainage at the incision site.  You have more swelling or pain at the port site or the surrounding area.  You have a fever that is not controlled with medicine.  This information is not intended to replace advice given to you by your health care provider. Make sure you discuss any questions you have with your health care provider. Document  Released: 03/25/2005 Document Revised: 08/31/2015 Document Reviewed: 11/30/2012 Elsevier Interactive Patient Education  2017 [employer]  

## 2017-12-03 NOTE — Progress Notes (Signed)
DISCONTINUE ON PATHWAY REGIMEN - Small Cell Lung     A cycle is every 21 days:     Etoposide      Carboplatin   **Always confirm dose/schedule in your pharmacy ordering system**  REASON: Disease Progression PRIOR TREATMENT: [id number]: Etoposide 100 mg/m2 Days 1, 2, 3 + Carboplatin AUC=5 Day 1 q21 Days x 4 Cycles with Concurrent Radiation TREATMENT RESPONSE: Progressive Disease (PD)  START ON PATHWAY REGIMEN - Small Cell Lung     A cycle is every 14 days:     Nivolumab   **Always confirm dose/schedule in your pharmacy ordering system**  Patient Characteristics: Extensive and Limited Stage, Third Line and Beyond Stage Classification: Extensive AJCC T Category: T2a AJCC N Category: N2 AJCC M Category: M1c AJCC 8 Stage Grouping: IVB Line of therapy: Third Line and Beyond  Intent of Therapy: Non-Curative / Palliative Intent, Discussed with Patient

## 2017-12-05 ENCOUNTER — Encounter: Payer: Self-pay | Admitting: Oncology

## 2017-12-05 ENCOUNTER — Telehealth: Payer: Self-pay | Admitting: Internal Medicine

## 2017-12-05 NOTE — Telephone Encounter (Signed)
Scheduled appt per 8/28 los- left message with appt date and time - sent reminder letter in the mail.

## 2017-12-17 ENCOUNTER — Telehealth: Payer: Self-pay | Admitting: Nurse Practitioner

## 2017-12-17 ENCOUNTER — Encounter: Payer: Self-pay | Admitting: Nurse Practitioner

## 2017-12-17 ENCOUNTER — Inpatient Hospital Stay: Payer: Medicare Other | Attending: Internal Medicine

## 2017-12-17 ENCOUNTER — Inpatient Hospital Stay: Payer: Medicare Other

## 2017-12-17 ENCOUNTER — Inpatient Hospital Stay (HOSPITAL_BASED_OUTPATIENT_CLINIC_OR_DEPARTMENT_OTHER): Payer: Medicare Other | Admitting: Nurse Practitioner

## 2017-12-17 VITALS — BP 183/69 | HR 68 | Temp 98.5°F | Resp 17 | Ht 61.0 in | Wt 78.8 lb

## 2017-12-17 DIAGNOSIS — C787 Secondary malignant neoplasm of liver and intrahepatic bile duct: Secondary | ICD-10-CM | POA: Insufficient documentation

## 2017-12-17 DIAGNOSIS — Z5112 Encounter for antineoplastic immunotherapy: Secondary | ICD-10-CM | POA: Diagnosis not present

## 2017-12-17 DIAGNOSIS — C778 Secondary and unspecified malignant neoplasm of lymph nodes of multiple regions: Secondary | ICD-10-CM | POA: Diagnosis not present

## 2017-12-17 DIAGNOSIS — C3412 Malignant neoplasm of upper lobe, left bronchus or lung: Secondary | ICD-10-CM

## 2017-12-17 DIAGNOSIS — C3492 Malignant neoplasm of unspecified part of left bronchus or lung: Secondary | ICD-10-CM

## 2017-12-17 DIAGNOSIS — Z79899 Other long term (current) drug therapy: Secondary | ICD-10-CM | POA: Insufficient documentation

## 2017-12-17 DIAGNOSIS — R5382 Chronic fatigue, unspecified: Secondary | ICD-10-CM

## 2017-12-17 DIAGNOSIS — C7951 Secondary malignant neoplasm of bone: Secondary | ICD-10-CM | POA: Diagnosis not present

## 2017-12-17 LAB — CBC WITH DIFFERENTIAL (CANCER CENTER ONLY)
BASOS PCT: 1 %
Basophils Absolute: 0.1 10*3/uL (ref 0.0–0.1)
EOS ABS: 0 10*3/uL (ref 0.0–0.5)
Eosinophils Relative: 0 %
HCT: 36.9 % (ref 34.8–46.6)
Hemoglobin: 12.5 g/dL (ref 11.6–15.9)
Lymphocytes Relative: 28 %
Lymphs Abs: 1.8 10*3/uL (ref 0.9–3.3)
MCH: 32.5 pg (ref 25.1–34.0)
MCHC: 34 g/dL (ref 31.5–36.0)
MCV: 95.7 fL (ref 79.5–101.0)
Monocytes Absolute: 0.4 10*3/uL (ref 0.1–0.9)
Monocytes Relative: 6 %
Neutro Abs: 4.3 10*3/uL (ref 1.5–6.5)
Neutrophils Relative %: 65 %
Platelet Count: 344 10*3/uL (ref 145–400)
RBC: 3.86 MIL/uL (ref 3.70–5.45)
RDW: 20.7 % — AB (ref 11.2–14.5)
WBC Count: 6.5 10*3/uL (ref 3.9–10.3)

## 2017-12-17 LAB — CMP (CANCER CENTER ONLY)
ALK PHOS: 152 U/L — AB (ref 38–126)
ALT: 17 U/L (ref 0–44)
AST: 18 U/L (ref 15–41)
Albumin: 3.3 g/dL — ABNORMAL LOW (ref 3.5–5.0)
Anion gap: 10 (ref 5–15)
BILIRUBIN TOTAL: 0.3 mg/dL (ref 0.3–1.2)
BUN: 17 mg/dL (ref 8–23)
CALCIUM: 9.5 mg/dL (ref 8.9–10.3)
CO2: 27 mmol/L (ref 22–32)
CREATININE: 1.02 mg/dL — AB (ref 0.44–1.00)
Chloride: 91 mmol/L — ABNORMAL LOW (ref 98–111)
GFR, EST NON AFRICAN AMERICAN: 52 mL/min — AB (ref >60)
Glucose, Bld: 380 mg/dL — ABNORMAL HIGH (ref 70–99)
Potassium: 4.7 mmol/L (ref 3.5–5.1)
Sodium: 128 mmol/L — ABNORMAL LOW (ref 135–145)
Total Protein: 7 g/dL (ref 6.5–8.1)

## 2017-12-17 LAB — TSH: TSH: 1.521 u[IU]/mL (ref 0.308–3.960)

## 2017-12-17 MED ORDER — SODIUM CHLORIDE 0.9 % IV SOLN
Freq: Once | INTRAVENOUS | Status: AC
  Administered 2017-12-17: 14:00:00 via INTRAVENOUS
  Filled 2017-12-17: qty 250

## 2017-12-17 MED ORDER — HEPARIN SOD (PORK) LOCK FLUSH 100 UNIT/ML IV SOLN
500.0000 [IU] | Freq: Once | INTRAVENOUS | Status: AC | PRN
  Administered 2017-12-17: 500 [IU]
  Filled 2017-12-17: qty 5

## 2017-12-17 MED ORDER — SODIUM CHLORIDE 0.9 % IV SOLN
240.0000 mg | Freq: Once | INTRAVENOUS | Status: AC
  Administered 2017-12-17: 240 mg via INTRAVENOUS
  Filled 2017-12-17: qty 24

## 2017-12-17 MED ORDER — SODIUM CHLORIDE 0.9% FLUSH
10.0000 mL | INTRAVENOUS | Status: DC | PRN
  Administered 2017-12-17: 10 mL
  Filled 2017-12-17: qty 10

## 2017-12-17 NOTE — Patient Instructions (Signed)
[facility] Discharge Instructions for Patients Receiving Chemotherapy  Today you received the following chemotherapy agents: Nivoumab.  To help prevent nausea and vomiting after your treatment, we encourage you to take your nausea medication as directed.   If you develop nausea and vomiting that is not controlled by your nausea medication, call the clinic.   BELOW ARE SYMPTOMS THAT SHOULD BE REPORTED IMMEDIATELY:  *FEVER GREATER THAN 100.5 F  *CHILLS WITH OR WITHOUT FEVER  NAUSEA AND VOMITING THAT IS NOT CONTROLLED WITH YOUR NAUSEA MEDICATION  *UNUSUAL SHORTNESS OF BREATH  *UNUSUAL BRUISING OR BLEEDING  TENDERNESS IN MOUTH AND THROAT WITH OR WITHOUT PRESENCE OF ULCERS  *URINARY PROBLEMS  *BOWEL PROBLEMS  UNUSUAL RASH Items with * indicate a potential emergency and should be followed up as soon as possible.  Feel free to call the clinic should you have any questions or concerns. The clinic phone number is [phone].  Please show the [place] at check-in to the Emergency Department and triage nurse.   Nivolumab injection What is this medicine? NIVOLUMAB (nye VOL ue mab) is a monoclonal antibody. It is used to treat melanoma, lung cancer, kidney cancer, head and neck cancer, Hodgkin lymphoma, urothelial cancer, colon cancer, and liver cancer. This medicine may be used for other purposes; ask your health care provider or pharmacist if you have questions. COMMON BRAND NAME(S): Opdivo What should I tell my health care provider before I take this medicine? They need to know if you have any of these conditions: -diabetes -immune system problems -kidney disease -liver disease -lung disease -organ transplant -stomach or intestine problems -thyroid disease -an unusual or allergic reaction to nivolumab, other medicines, foods, dyes, or preservatives -pregnant or trying to get pregnant -breast-feeding How should I use this medicine? This medicine  is for infusion into a vein. It is given by a health care professional in a hospital or clinic setting. A special MedGuide will be given to you before each treatment. Be sure to read this information carefully each time. Talk to your pediatrician regarding the use of this medicine in children. While this drug may be prescribed for children as young as 12 years for selected conditions, precautions do apply. Overdosage: If you think you have taken too much of this medicine contact a poison control center or emergency room at once. NOTE: This medicine is only for you. Do not share this medicine with others. What if I miss a dose? It is important not to miss your dose. Call your doctor or health care professional if you are unable to keep an appointment. What may interact with this medicine? Interactions have not been studied. Give your health care provider a list of all the medicines, herbs, non-prescription drugs, or dietary supplements you use. Also tell them if you smoke, drink alcohol, or use illegal drugs. Some items may interact with your medicine. This list may not describe all possible interactions. Give your health care provider a list of all the medicines, herbs, non-prescription drugs, or dietary supplements you use. Also tell them if you smoke, drink alcohol, or use illegal drugs. Some items may interact with your medicine. What should I watch for while using this medicine? This drug may make you feel generally unwell. Continue your course of treatment even though you feel ill unless your doctor tells you to stop. You may need blood work done while you are taking this medicine. Do not become pregnant while taking this medicine or for 5 months after  stopping it. Women should inform their doctor if they wish to become pregnant or think they might be pregnant. There is a potential for serious side effects to an unborn child. Talk to your health care professional or pharmacist for more  information. Do not breast-feed an infant while taking this medicine. What side effects may I notice from receiving this medicine? Side effects that you should report to your doctor or health care professional as soon as possible: -allergic reactions like skin rash, itching or hives, swelling of the face, lips, or tongue -black, tarry stools -blood in the urine -bloody or watery diarrhea -changes in vision -change in sex drive -changes in emotions or moods -chest pain -confusion -cough -decreased appetite -diarrhea -facial flushing -feeling faint or lightheaded -fever, chills -hair loss -hallucination, loss of contact with reality -headache -irritable -joint pain -loss of memory -muscle pain -muscle weakness -seizures -shortness of breath -signs and symptoms of high blood sugar such as dizziness; dry mouth; dry skin; fruity breath; nausea; stomach pain; increased hunger or thirst; increased urination -signs and symptoms of kidney injury like trouble passing urine or change in the amount of urine -signs and symptoms of liver injury like dark yellow or brown urine; general ill feeling or flu-like symptoms; light-colored stools; loss of appetite; nausea; right upper belly pain; unusually weak or tired; yellowing of the eyes or skin -stiff neck -swelling of the ankles, feet, hands -weight gain Side effects that usually do not require medical attention (report to your doctor or health care professional if they continue or are bothersome): -bone pain -constipation -tiredness -vomiting This list may not describe all possible side effects. Call your doctor for medical advice about side effects. You may report side effects to FDA at 1-800-FDA-1088. Where should I keep my medicine? This drug is given in a hospital or clinic and will not be stored at home. NOTE: This sheet is a summary. It may not cover all possible information. If you have questions about this medicine, talk to your  doctor, pharmacist, or health care provider.  2018 Elsevier/Gold Standard (2016-01-01 17:49:34)

## 2017-12-17 NOTE — Telephone Encounter (Signed)
No 9/11 los 

## 2017-12-17 NOTE — Progress Notes (Addendum)
  [facility] OFFICE PROGRESS NOTE   DIAGNOSIS: Extensive stage (T2a, N2, M1c) small cell lung cancer presented with subpleural mass within the left upper lobe with associated ipsilateral hilar and mediastinal nodal metastasis, liver metastasis and bone metastasis  PRIOR THERAPY: Systemic chemotherapy with carboplatin for AUC of 5 on day 1 and etoposide 100 mg/M2 on days 1, 2 and 3 with Neulasta support every 3 weeks.  Status post 6 cycles, last dose was given 11/10/2017.  CURRENT THERAPY: Nivolumab 240 mg IV every 2 weeks beginning 12/17/2017  INTERVAL HISTORY:   Ms. Caputo returns as scheduled.  She reports a good appetite.  She thinks she has lost a few more pounds.  No nausea or vomiting.  No mouth sores.  No diarrhea.  No rash.  She denies shortness of breath, cough and fever.  Objective:  Vital signs in last 24 hours:  Blood pressure (!) 183/69, pulse 68, temperature 98.5 F (36.9 C), temperature source Oral, resp. rate 17, height 5\' 1"  (1.549 m), weight 78 lb 12.8 oz (35.7 kg), SpO2 100 %.    HEENT: No thrush or ulcers. Resp: Distant breath sounds. Cardio: Regular rate and rhythm. GI: Abdomen soft and nontender.  No hepatomegaly. Vascular: No leg edema. Neuro: Alert and oriented.  Port-A-Cath without erythema.  Lab Results:  Lab Results  Component Value Date   WBC 6.5 12/17/2017   HGB 12.5 12/17/2017   HCT 36.9 12/17/2017   MCV 95.7 12/17/2017   PLT 344 12/17/2017   NEUTROABS 4.3 12/17/2017    Imaging:  No results found.  Medications: I have reviewed the patient's current medications.  Assessment/Plan: 1. Extensive stage small cell lung cancer status post 6 cycles of carboplatin/etoposide with recent evidence of disease progression.  Plan to begin nivolumab 240 mg IV every 2 weeks today.  Disposition: Ms. Koerber appears stable.  Plan to proceed with cycle 1 nivolumab today as scheduled.  We again reviewed potential toxicities.  She agrees to  proceed.  She will return for lab, follow-up and cycle 2 nivolumab in 2 weeks.  She will contact the office in the interim with any problems.  Patient seen with Dr. Julien Nordmann.    Ned Card ANP/GNP-BC   12/17/2017  1:52 PM  ADDENDUM: Hematology/Oncology Attending: I had a face-to-face encounter with the patient today.  I recommended her care plan.  This is a very pleasant 77 years old white female with extensive stage small cell lung cancer status post 6 cycles of systemic chemotherapy with carboplatin and etoposide.  She has initial response to this treatment followed by disease progression.  The patient is not a good candidate for second line systemic chemotherapy but she was agreeable to treatment with immunotherapy with single agent nivolumab.  She is here today to start the first cycle of this treatment.  She will be treated with nivolumab 240 mg IV every 2 weeks. The patient will come back for follow-up visit in 2 weeks for evaluation before starting cycle #2. She was advised to call immediately if she has any concerning symptoms in the interval.  Disclaimer: This note was dictated with voice recognition software. Similar sounding words can inadvertently be transcribed and may be missed upon review. [name], MD 12/17/17

## 2018-01-01 ENCOUNTER — Telehealth: Payer: Self-pay | Admitting: Oncology

## 2018-01-01 ENCOUNTER — Inpatient Hospital Stay: Payer: Medicare Other

## 2018-01-01 ENCOUNTER — Inpatient Hospital Stay (HOSPITAL_BASED_OUTPATIENT_CLINIC_OR_DEPARTMENT_OTHER): Payer: Medicare Other | Admitting: Oncology

## 2018-01-01 ENCOUNTER — Encounter: Payer: Self-pay | Admitting: Oncology

## 2018-01-01 ENCOUNTER — Other Ambulatory Visit: Payer: Self-pay

## 2018-01-01 VITALS — BP 152/67 | HR 68 | Temp 98.7°F | Resp 17 | Ht 61.0 in | Wt 79.1 lb

## 2018-01-01 DIAGNOSIS — C787 Secondary malignant neoplasm of liver and intrahepatic bile duct: Secondary | ICD-10-CM | POA: Diagnosis not present

## 2018-01-01 DIAGNOSIS — E119 Type 2 diabetes mellitus without complications: Secondary | ICD-10-CM | POA: Diagnosis not present

## 2018-01-01 DIAGNOSIS — C3492 Malignant neoplasm of unspecified part of left bronchus or lung: Secondary | ICD-10-CM

## 2018-01-01 DIAGNOSIS — C7951 Secondary malignant neoplasm of bone: Secondary | ICD-10-CM

## 2018-01-01 DIAGNOSIS — Z794 Long term (current) use of insulin: Secondary | ICD-10-CM

## 2018-01-01 DIAGNOSIS — C3412 Malignant neoplasm of upper lobe, left bronchus or lung: Secondary | ICD-10-CM | POA: Diagnosis not present

## 2018-01-01 DIAGNOSIS — Z79899 Other long term (current) drug therapy: Secondary | ICD-10-CM | POA: Diagnosis not present

## 2018-01-01 DIAGNOSIS — Z5112 Encounter for antineoplastic immunotherapy: Secondary | ICD-10-CM

## 2018-01-01 LAB — CBC WITH DIFFERENTIAL (CANCER CENTER ONLY)
BASOS ABS: 0.1 10*3/uL (ref 0.0–0.1)
BASOS PCT: 1 %
EOS ABS: 0 10*3/uL (ref 0.0–0.5)
Eosinophils Relative: 0 %
HEMATOCRIT: 32.2 % — AB (ref 34.8–46.6)
Hemoglobin: 10.9 g/dL — ABNORMAL LOW (ref 11.6–15.9)
Lymphocytes Relative: 24 %
Lymphs Abs: 1.5 10*3/uL (ref 0.9–3.3)
MCH: 32.4 pg (ref 25.1–34.0)
MCHC: 33.8 g/dL (ref 31.5–36.0)
MCV: 96.1 fL (ref 79.5–101.0)
Monocytes Absolute: 0.5 10*3/uL (ref 0.1–0.9)
Monocytes Relative: 7 %
NEUTROS ABS: 4.2 10*3/uL (ref 1.5–6.5)
Neutrophils Relative %: 68 %
PLATELETS: 303 10*3/uL (ref 145–400)
RBC: 3.35 MIL/uL — ABNORMAL LOW (ref 3.70–5.45)
RDW: 19.1 % — AB (ref 11.2–14.5)
WBC Count: 6.2 10*3/uL (ref 3.9–10.3)

## 2018-01-01 LAB — CMP (CANCER CENTER ONLY)
ALBUMIN: 2.8 g/dL — AB (ref 3.5–5.0)
ALK PHOS: 134 U/L — AB (ref 38–126)
ALT: 17 U/L (ref 0–44)
ANION GAP: 8 (ref 5–15)
AST: 18 U/L (ref 15–41)
BILIRUBIN TOTAL: 0.3 mg/dL (ref 0.3–1.2)
BUN: 17 mg/dL (ref 8–23)
CALCIUM: 8.9 mg/dL (ref 8.9–10.3)
CO2: 30 mmol/L (ref 22–32)
CREATININE: 1.03 mg/dL — AB (ref 0.44–1.00)
Chloride: 89 mmol/L — ABNORMAL LOW (ref 98–111)
GFR, Estimated: 52 mL/min — ABNORMAL LOW (ref >60)
GLUCOSE: 612 mg/dL — AB (ref 70–99)
Potassium: 4.2 mmol/L (ref 3.5–5.1)
Sodium: 127 mmol/L — ABNORMAL LOW (ref 135–145)
TOTAL PROTEIN: 6.7 g/dL (ref 6.5–8.1)

## 2018-01-01 MED ORDER — SODIUM CHLORIDE 0.9% FLUSH
10.0000 mL | INTRAVENOUS | Status: DC | PRN
  Administered 2018-01-01: 10 mL
  Filled 2018-01-01: qty 10

## 2018-01-01 MED ORDER — SODIUM CHLORIDE 0.9 % IV SOLN
240.0000 mg | Freq: Once | INTRAVENOUS | Status: AC
  Administered 2018-01-01: 240 mg via INTRAVENOUS
  Filled 2018-01-01: qty 24

## 2018-01-01 MED ORDER — SODIUM CHLORIDE 0.9 % IV SOLN
Freq: Once | INTRAVENOUS | Status: AC
  Administered 2018-01-01: 17:00:00 via INTRAVENOUS
  Filled 2018-01-01: qty 250

## 2018-01-01 MED ORDER — HEPARIN SOD (PORK) LOCK FLUSH 100 UNIT/ML IV SOLN
500.0000 [IU] | Freq: Once | INTRAVENOUS | Status: AC | PRN
  Administered 2018-01-01: 500 [IU]
  Filled 2018-01-01: qty 5

## 2018-01-01 NOTE — Progress Notes (Signed)
Patient had blood glucose >600 and asymptomatic. Checked with glucometer with result of 552 at 3:45 per Mikey Bussing NP and started NS at 999 for 500 mL. Recheck of Bs at 4:15 at 481. Received order from Mikey Bussing NP okay to treat. Patient aware to recheck at 6 pm and if over 300 then will administer 10 units of Novolog.

## 2018-01-01 NOTE — Telephone Encounter (Signed)
Appts already scheduled per 9/26 los - no additional appts aded - already 3 cycles.

## 2018-01-01 NOTE — Progress Notes (Addendum)
[facility] OFFICE PROGRESS NOTE  Katherine, Steva Cole, [place] [address]  DIAGNOSIS:Extensive stage (T2a, N2, M1c) small cell lung cancer presented with subpleural mass within the left upper lobe with associated ipsilateral hilar and mediastinal nodal metastasis, liver metastasis and bone metastasis  PRIOR THERAPY: Systemic chemotherapy with carboplatin for AUC of 5 on day 1 and etoposide 100 mg/M2 on days 1, 2 and 3 with Neulasta support every 3 weeks.Status post 6 cycles, last dose was given8/08/2017.  CURRENT THERAPY: Nivolumab 240 mg IV every 2 weeks beginning 12/17/2017.  Status post 1 cycle.  INTERVAL HISTORY: Katherine Cole 77 y.o. female returns for routine follow-up visit accompanied by her niece.  The patient is feeling fine today and has no specific complaints.  She tolerated her first cycle of nivolumab fairly well.  She denies fevers and chills.  Denies chest pain, shortness of breath, cough, hemoptysis.  Denies nausea, vomiting, constipation, diarrhea.  Denies recent weight loss or night sweats.  The patient is here for evaluation prior to cycle #2 over treatment.  MEDICAL HISTORY: Past Medical History:  Diagnosis Date  . Arthritis    "in my fingers"   . CAD (coronary artery disease)    a. MI 1999 with 2 stents at that time followed by stenting 3-4 yrs later. b. s/p DES to LAD guided by pressure wire 03/11/12.  . Colon polyps    adenomatous  . Depression   . Diabetes mellitus, type 2 ([facility])   . Hyperlipemia   . Hypertension   . Osteoarthritis   . Pneumonia 06/2017  . Skin cancer 2012   "nose; right middle finger"  . Stroke [facility])     ALLERGIES:  is allergic to codeine and morphine and related.  MEDICATIONS:  Current Outpatient Medications  Medication Sig Dispense Refill  . ALPRAZolam (XANAX) 0.25 MG tablet Take 0.25 mg by mouth at bedtime as needed for anxiety.    Marland Kitchen amLODipine (NORVASC) 10 MG tablet Take 1 tablet (10 mg total)  by mouth daily. 30 tablet 0  . aspirin 81 MG tablet Take 1 tablet (81 mg total) by mouth daily. (Patient taking differently: Take 81 mg by mouth as directed. On mondays and Thursdays) 30 tablet 1  . atorvastatin (LIPITOR) 80 MG tablet Take 80 mg by mouth daily.    . clopidogrel (PLAVIX) 75 MG tablet Take 75 mg by mouth daily.    . fluticasone (FLONASE) 50 MCG/ACT nasal spray PLACE 1 SPRAY INTO BOTH NOSTRILS DAILY. 16 g 2  . guaiFENesin (MUCINEX) 600 MG 12 hr tablet Take 2 tablets (1,200 mg total) by mouth 2 (two) times daily as needed (Use for one week post discharge, then twice daily as needed).    Marland Kitchen HUMALOG KWIKPEN 100 UNIT/ML KiwkPen 0-8 units three times daily after meals  2  . ipratropium-albuterol (DUONEB) 0.5-2.5 (3) MG/3ML SOLN Take 3 mLs by nebulization every 6 (six) hours as needed (Shortness of breath or wheezing). 360 mL 3  . lactose free nutrition (BOOST PLUS) LIQD Take 237 mLs by mouth 3 (three) times daily between meals. 14 Can 0  . lidocaine-prilocaine (EMLA) cream Apply 1 application topically as needed. Apply topically to port site 1-2 hours prior to chemotherapy or labs. Cover with plastic wrap. 30 g 0  . loratadine (CLARITIN) 10 MG tablet Take 10 mg by mouth daily.    . metFORMIN (GLUCOPHAGE) 1000 MG tablet Take 1,000 mg by mouth daily.     . metoprolol succinate (  TOPROL-XL) 50 MG 24 hr tablet Take 50 mg by mouth daily. Take with or immediately following a meal.    . mirtazapine (REMERON) 15 MG tablet Take 15 mg by mouth at bedtime.    . nitroGLYCERIN (NITROSTAT) 0.4 MG SL tablet Place 1 tablet (0.4 mg total) under the tongue every 5 (five) minutes as needed (up to 3 doses). For chest pain    . ondansetron (ZOFRAN ODT) 4 MG disintegrating tablet Take 1 tablet (4 mg total) by mouth every 8 (eight) hours as needed for nausea or vomiting. 20 tablet 3  . prochlorperazine (COMPAZINE) 10 MG tablet Take 1 tablet (10 mg total) by mouth every 6 (six) hours as needed for nausea or vomiting.  30 tablet 0  . promethazine (PHENERGAN) 12.5 MG tablet Take 1 tablet (12.5 mg total) by mouth every 4 (four) hours as needed for nausea or vomiting. 20 tablet 0  . sitaGLIPtin (JANUVIA) 100 MG tablet Take 0.5 tablets (50 mg total) by mouth daily. 14 tablet 0  . sodium chloride (OCEAN) 0.65 % SOLN nasal spray Place 1 spray into both nostrils as needed for congestion (use twice daily for one week post discharge, then as needed).  0   No current facility-administered medications for this visit.     SURGICAL HISTORY:  Past Surgical History:  Procedure Laterality Date  . APPENDECTOMY  ~ 1965  . CATARACT EXTRACTION    . CORONARY ANGIOPLASTY WITH STENT PLACEMENT  1999; 2004; 03/11/2012   "2 + 1 + 1; total of 4" (03/11/2012)  . FEMUR FRACTURE SURGERY  2011   RLE (03/11/2012)  . IR FLUORO GUIDE PORT INSERTION RIGHT  07/18/2017  . IR US GUIDE VASC ACCESS RIGHT  07/18/2017  . PERCUTANEOUS CORONARY STENT INTERVENTION (PCI-S) N/A 03/11/2012   Procedure: PERCUTANEOUS CORONARY STENT INTERVENTION (PCI-S);  Surgeon: Sherren Mocha, MD;  Location: [facility] CATH LAB;  Service: Cardiovascular;  Laterality: N/A;  . SKIN CANCER EXCISION  2013   "nose & right middle finger; actinic keratosis" (03/11/2012)  . TONSILLECTOMY AND ADENOIDECTOMY  ~ 1962  . TUBAL LIGATION  1977  . VIDEO BRONCHOSCOPY WITH ENDOBRONCHIAL ULTRASOUND N/A 07/04/2017   Procedure: VIDEO BRONCHOSCOPY WITH ENDOBRONCHIAL ULTRASOUND;  Surgeon: Collene Gobble, MD;  Location: [facility] OR;  Service: Thoracic;  Laterality: N/A;    REVIEW OF SYSTEMS:   Review of Systems  Constitutional: Negative for appetite change, chills, fatigue, fever and unexpected weight change.  HENT:   Negative for mouth sores, nosebleeds, sore throat and trouble swallowing.   Eyes: Negative for eye problems and icterus.  Respiratory: Negative for cough, hemoptysis, shortness of breath and wheezing.   Cardiovascular: Negative for chest pain and leg swelling.  Gastrointestinal: Negative  for abdominal pain, constipation, diarrhea, nausea and vomiting.  Genitourinary: Negative for bladder incontinence, difficulty urinating, dysuria, frequency and hematuria.   Musculoskeletal: Negative for back pain, gait problem, neck pain and neck stiffness.  Skin: Negative for itching and rash.  Neurological: Negative for dizziness, extremity weakness, gait problem, headaches, light-headedness and seizures.  Hematological: Negative for adenopathy. Does not bruise/bleed easily.  Psychiatric/Behavioral: Negative for confusion, depression and sleep disturbance. The patient is not nervous/anxious.     PHYSICAL EXAMINATION:  Blood pressure (!) 152/67, pulse 68, temperature 98.7 F (37.1 C), temperature source Oral, resp. rate 17, height 5\' 1"  (1.549 m), weight 79 lb 1.6 oz (35.9 kg), SpO2 100 %.  ECOG PERFORMANCE STATUS: 1 - Symptomatic but completely ambulatory  Physical Exam  Constitutional: Oriented to person, place,  and time. No distress.  HENT:  Head: Normocephalic and atraumatic.  Mouth/Throat: Oropharynx is clear and moist. No oropharyngeal exudate.  Eyes: Conjunctivae are normal. Right eye exhibits no discharge. Left eye exhibits no discharge. No scleral icterus.  Neck: Normal range of motion. Neck supple.  Cardiovascular: Normal rate, regular rhythm, normal heart sounds and intact distal pulses.   Pulmonary/Chest: Effort normal and breath sounds normal. No respiratory distress. No wheezes. No rales.  Abdominal: Soft. Bowel sounds are normal. Exhibits no distension and no mass. There is no tenderness.  Musculoskeletal: Normal range of motion. Exhibits no edema.  Lymphadenopathy:    No cervical adenopathy.  Neurological: Alert and oriented to person, place, and time. Exhibits normal muscle tone. Gait normal. Coordination normal.  Skin: Skin is warm and dry. No rash noted. Not diaphoretic. No erythema. No pallor.  Psychiatric: Mood, memory and judgment normal.  Vitals  reviewed.  LABORATORY DATA: Lab Results  Component Value Date   WBC 6.2 01/01/2018   HGB 10.9 (L) 01/01/2018   HCT 32.2 (L) 01/01/2018   MCV 96.1 01/01/2018   PLT 303 01/01/2018      Chemistry      Component Value Date/Time   NA 128 (L) 12/17/2017 1312   K 4.7 12/17/2017 1312   CL 91 (L) 12/17/2017 1312   CO2 27 12/17/2017 1312   BUN 17 12/17/2017 1312   CREATININE 1.02 (H) 12/17/2017 1312      Component Value Date/Time   CALCIUM 9.5 12/17/2017 1312   ALKPHOS 152 (H) 12/17/2017 1312   AST 18 12/17/2017 1312   ALT 17 12/17/2017 1312   BILITOT 0.3 12/17/2017 1312       RADIOGRAPHIC STUDIES:  No results found.   ASSESSMENT/PLAN:  Primary small cell carcinoma of left lung ([place]) This is a very pleasant 77 year old white female recently diagnosed with extensive stage small cell lung cancer and currently undergoing systemic chemotherapy with carboplatin and etoposide status post 6 cycles. She has a rough time with the previous course of chemotherapy with significant pancytopenia as well as chemotherapy-induced anemia requiring PRBCs transfusion.   Following her chemotherapy, her scan showed evidence for disease progression. She is now on Nivolumab 240 mg IV every 2 weeks.  Status post 1 cycle which she tolerated fairly well with no concerning complaints. Recommend for her to proceed with cycle 2 of her treatment today as scheduled.  She will follow-up in 2 weeks for evaluation prior to cycle #3.  She was advised to call immediately if she has any concerning symptoms in the interval. The patient voices understanding of current disease status and treatment options and is in agreement with the current care plan.  All questions were answered. The patient knows to call the clinic with any problems, questions or concerns. We can certainly see the patient much sooner if necessary.  ADDENDUM: After the patient was sent to the infusion area, I received a critical glucose on her  612.  The patient states that she has not been checking her blood sugars on a regular basis because the scanner was not working.  She does have a regular glucometer at home but she has not been using it.  A fingerstick glucose was checked in the infusion area which was 552.  The patient was started on normal saline at 999 cc/h.  The patient had her insulin with her and self administer 12 units of Humalog.  Recheck fingerstick glucose was at 480 after 30 minutes.  This is  consistent with where the patient typically runs.  She states that if she drops below 200 she does not feel well.  I had a lengthy discussion with the patient and her niece that she needs to monitor her glucose very closely at home.  Her primary care provider has her on Lantus insulin which she has been taking routinely.  She takes Humalog only if her blood sugar is greater than 300.  The patient was instructed to recheck her blood sugar again this evening at 6 PM.  If greater than 300, she will take her Humalog as directed.  She was advised to go to the emergency department for evaluation if her blood sugar goes above 500 or she becomes more symptomatically with increased fatigue, polyuria, or any other concerning symptoms.  I spent a total of 30 minutes counseling the patient face-to-face for her visit today.  The total time spent in the appointment was 45 minutes.  No orders of the defined types were placed in this encounter.    Mikey Bussing, DNP, AGPCNP-BC, AOCNP 01/01/18

## 2018-01-01 NOTE — Assessment & Plan Note (Addendum)
This is a very pleasant 77 year old white female recently diagnosed with extensive stage small cell lung cancer and currently undergoing systemic chemotherapy with carboplatin and etoposide status post 6 cycles. She has a rough time with the previous course of chemotherapy with significant pancytopenia as well as chemotherapy-induced anemia requiring PRBCs transfusion.   Following her chemotherapy, her scan showed evidence for disease progression. She is now on Nivolumab 240 mg IV every 2 weeks.  Status post 1 cycle which she tolerated fairly well with no concerning complaints. Recommend for her to proceed with cycle 2 of her treatment today as scheduled.  She will follow-up in 2 weeks for evaluation prior to cycle #3.  She was advised to call immediately if she has any concerning symptoms in the interval. The patient voices understanding of current disease status and treatment options and is in agreement with the current care plan.  All questions were answered. The patient knows to call the clinic with any problems, questions or concerns. We can certainly see the patient much sooner if necessary.

## 2018-01-01 NOTE — Patient Instructions (Signed)
[facility] Discharge Instructions for Patients Receiving Chemotherapy  Today you received the following chemotherapy agents Opdivo  To help prevent nausea and vomiting after your treatment, we encourage you to take your nausea medication as directed   If you develop nausea and vomiting that is not controlled by your nausea medication, call the clinic.   BELOW ARE SYMPTOMS THAT SHOULD BE REPORTED IMMEDIATELY:  *FEVER GREATER THAN 100.5 F  *CHILLS WITH OR WITHOUT FEVER  NAUSEA AND VOMITING THAT IS NOT CONTROLLED WITH YOUR NAUSEA MEDICATION  *UNUSUAL SHORTNESS OF BREATH  *UNUSUAL BRUISING OR BLEEDING  TENDERNESS IN MOUTH AND THROAT WITH OR WITHOUT PRESENCE OF ULCERS  *URINARY PROBLEMS  *BOWEL PROBLEMS  UNUSUAL RASH Items with * indicate a potential emergency and should be followed up as soon as possible.  Feel free to call the clinic should you have any questions or concerns. The clinic phone number is [phone].  Please show the CHEMO ALERT CARD at check-in to the Emergency Department and triage nurse.   

## 2018-01-02 ENCOUNTER — Telehealth: Payer: Self-pay

## 2018-01-02 LAB — GLUCOSE, CAPILLARY
GLUCOSE-CAPILLARY: 481 mg/dL — AB (ref 70–99)
GLUCOSE-CAPILLARY: 552 mg/dL — AB (ref 70–99)

## 2018-01-02 NOTE — Telephone Encounter (Signed)
-----   Message from Maryanna Shape, NP sent at 01/02/2018  9:34 AM EDT ----- Regarding: GLUCOSE Please call pt today and check on her. See how her blood sugars are running and make sure that she is taking insulin.  Thanks

## 2018-01-02 NOTE — Addendum Note (Signed)
Addended by: Maryanna Shape on: 01/02/2018 09:34 AM   Modules accepted: Level of Service

## 2018-01-02 NOTE — Telephone Encounter (Signed)
Contacted patient to follow up on blood sugars after treatment yesterday. She stated that she feels fine and her blood sugar this am was 160. She was appreciative of the call and had no questions or concerns.

## 2018-01-05 ENCOUNTER — Telehealth: Payer: Self-pay | Admitting: Medical Oncology

## 2018-01-05 NOTE — Telephone Encounter (Signed)
ime to recertify FMLA .PLease call her [phone]. Transferred voice mail to [facility].

## 2018-01-08 NOTE — Progress Notes (Signed)
FMLA for daughter, Roland Earl, successfully faxed to [place] at [phone]. Mailed copy to patient address on file.

## 2018-01-13 ENCOUNTER — Inpatient Hospital Stay: Payer: Medicare Other

## 2018-01-13 ENCOUNTER — Inpatient Hospital Stay: Payer: Medicare Other | Attending: Internal Medicine

## 2018-01-13 ENCOUNTER — Telehealth: Payer: Self-pay | Admitting: Oncology

## 2018-01-13 ENCOUNTER — Encounter: Payer: Self-pay | Admitting: Oncology

## 2018-01-13 ENCOUNTER — Inpatient Hospital Stay (HOSPITAL_BASED_OUTPATIENT_CLINIC_OR_DEPARTMENT_OTHER): Payer: Medicare Other | Admitting: Oncology

## 2018-01-13 VITALS — BP 150/60 | HR 75 | Temp 98.3°F | Resp 17 | Wt 77.1 lb

## 2018-01-13 DIAGNOSIS — C3412 Malignant neoplasm of upper lobe, left bronchus or lung: Secondary | ICD-10-CM | POA: Diagnosis not present

## 2018-01-13 DIAGNOSIS — D6481 Anemia due to antineoplastic chemotherapy: Secondary | ICD-10-CM | POA: Diagnosis not present

## 2018-01-13 DIAGNOSIS — D61818 Other pancytopenia: Secondary | ICD-10-CM | POA: Diagnosis not present

## 2018-01-13 DIAGNOSIS — Z79899 Other long term (current) drug therapy: Secondary | ICD-10-CM | POA: Diagnosis not present

## 2018-01-13 DIAGNOSIS — R5382 Chronic fatigue, unspecified: Secondary | ICD-10-CM

## 2018-01-13 DIAGNOSIS — C7951 Secondary malignant neoplasm of bone: Secondary | ICD-10-CM | POA: Diagnosis not present

## 2018-01-13 DIAGNOSIS — Z5112 Encounter for antineoplastic immunotherapy: Secondary | ICD-10-CM | POA: Insufficient documentation

## 2018-01-13 DIAGNOSIS — C787 Secondary malignant neoplasm of liver and intrahepatic bile duct: Secondary | ICD-10-CM | POA: Insufficient documentation

## 2018-01-13 DIAGNOSIS — C3492 Malignant neoplasm of unspecified part of left bronchus or lung: Secondary | ICD-10-CM

## 2018-01-13 DIAGNOSIS — C3490 Malignant neoplasm of unspecified part of unspecified bronchus or lung: Secondary | ICD-10-CM

## 2018-01-13 LAB — CBC WITH DIFFERENTIAL (CANCER CENTER ONLY)
Abs Immature Granulocytes: 0.02 10*3/uL (ref 0.00–0.07)
BASOS ABS: 0.1 10*3/uL (ref 0.0–0.1)
BASOS PCT: 1 %
Eosinophils Absolute: 0 10*3/uL (ref 0.0–0.5)
Eosinophils Relative: 0 %
HCT: 33.6 % — ABNORMAL LOW (ref 36.0–46.0)
HEMOGLOBIN: 11.8 g/dL — AB (ref 12.0–15.0)
Immature Granulocytes: 0 %
Lymphocytes Relative: 27 %
Lymphs Abs: 2 10*3/uL (ref 0.7–4.0)
MCH: 32.3 pg (ref 26.0–34.0)
MCHC: 35.1 g/dL (ref 30.0–36.0)
MCV: 92.1 fL (ref 80.0–100.0)
Monocytes Absolute: 0.5 10*3/uL (ref 0.1–1.0)
Monocytes Relative: 7 %
NEUTROS ABS: 4.6 10*3/uL (ref 1.7–7.7)
NRBC: 0 % (ref 0.0–0.2)
Neutrophils Relative %: 65 %
PLATELETS: 254 10*3/uL (ref 150–400)
RBC: 3.65 MIL/uL — AB (ref 3.87–5.11)
RDW: 16.2 % — ABNORMAL HIGH (ref 11.5–15.5)
WBC: 7.2 10*3/uL (ref 4.0–10.5)

## 2018-01-13 LAB — CMP (CANCER CENTER ONLY)
ALBUMIN: 3.2 g/dL — AB (ref 3.5–5.0)
ALK PHOS: 135 U/L — AB (ref 38–126)
ALT: 26 U/L (ref 0–44)
AST: 27 U/L (ref 15–41)
Anion gap: 11 (ref 5–15)
BUN: 15 mg/dL (ref 8–23)
CALCIUM: 9.3 mg/dL (ref 8.9–10.3)
CHLORIDE: 93 mmol/L — AB (ref 98–111)
CO2: 27 mmol/L (ref 22–32)
Creatinine: 0.82 mg/dL (ref 0.44–1.00)
GFR, Estimated: >60 mL/min (ref >60)
GLUCOSE: 215 mg/dL — AB (ref 70–99)
Potassium: 4.2 mmol/L (ref 3.5–5.1)
SODIUM: 131 mmol/L — AB (ref 135–145)
Total Bilirubin: 0.7 mg/dL (ref 0.3–1.2)
Total Protein: 7.3 g/dL (ref 6.5–8.1)

## 2018-01-13 LAB — TSH: TSH: 0.865 u[IU]/mL (ref 0.308–3.960)

## 2018-01-13 MED ORDER — SODIUM CHLORIDE 0.9 % IV SOLN
Freq: Once | INTRAVENOUS | Status: AC
  Administered 2018-01-13: 13:00:00 via INTRAVENOUS
  Filled 2018-01-13: qty 250

## 2018-01-13 MED ORDER — SODIUM CHLORIDE 0.9 % IV SOLN
240.0000 mg | Freq: Once | INTRAVENOUS | Status: AC
  Administered 2018-01-13: 240 mg via INTRAVENOUS
  Filled 2018-01-13: qty 24

## 2018-01-13 MED ORDER — HEPARIN SOD (PORK) LOCK FLUSH 100 UNIT/ML IV SOLN
500.0000 [IU] | Freq: Once | INTRAVENOUS | Status: AC | PRN
  Administered 2018-01-13: 500 [IU]
  Filled 2018-01-13: qty 5

## 2018-01-13 MED ORDER — SODIUM CHLORIDE 0.9% FLUSH
10.0000 mL | INTRAVENOUS | Status: DC | PRN
  Administered 2018-01-13: 10 mL
  Filled 2018-01-13: qty 10

## 2018-01-13 NOTE — Progress Notes (Signed)
Nutrition Follow-up:  Patient with lung cancer.  Patient on nivolumab.    Met with patient and daughter in infusion.  They both report appetite is good despite patient continuing to loose weight.  Reports that she is drinking boost shakes, eating all the time.  Daughter reports that brother keeps her stocked in boost shakes.  Reports has plenty of food in cabinets and refrigerator at home.  Asked to describe typical day and patient did not describe.  Daughter reports that she eats well but burns it all off by doing cleaning and activities of daily living at home.    Noted blood glucose has been elevated in 500s.  Question if this could be contributing to weight loss.  Medications: remeron  Labs: glucose 215  Anthropometrics:   Weight 77 lb today decreased from 79 lb on 8/6.   NUTRITION DIAGNOSIS: Inadequate oral intake continues   MALNUTRITION DIAGNOSIS: severe malnutrition continues   INTERVENTION:  Encouraged patient to continue to focus on high calorie, high protein foods. Encouraged good blood glucose control by checking blood glucose and taking medication.  Recommend for patient to continue to liberalize diet and have medication adjusted for better blood glucose control.     MONITORING, EVALUATION, GOAL: patient will tolerate increased calories and protein to promote weight gain   NEXT VISIT: Wednesday, Nov 6 during infusion  Julio Zappia B. Zenia Resides, [place], [place] Registered Dietitian [phone] (pager)

## 2018-01-13 NOTE — Progress Notes (Signed)
[facility] OFFICE PROGRESS NOTE  [name], [place] [address]  DIAGNOSIS:Extensive stage (T2a, N2, M1c) small cell lung cancer presented with subpleural mass within the left upper lobe with associated ipsilateral hilar and mediastinal nodal metastasis, liver metastasis and bone metastasis  PRIOR THERAPY: Systemic chemotherapy with carboplatin for AUC of 5 on day 1 and etoposide 100 mg/M2 on days 1, 2 and 3 with Neulasta support every 3 weeks.Status post 6 cycles, last dose was given8/08/2017.  CURRENT THERAPY:Nivolumab 240 mg IV every 2 weeks beginning 12/17/2017.  Status post 2 cycles.  INTERVAL HISTORY: Katherine Cole 77 y.o. female returns for routine follow-up visit accompanied by her daughter.  The patient is feeling fine today and has no specific complaints.  She denies fevers and chills.  Denies chest pain, shortness breath, cough, hemoptysis.  Denies nausea, vomiting, constipation, diarrhea.  The patient's daughter states she has a very good appetite despite the fact she lost weight.  Denies night sweats.  The patient has been checking her blood sugars on a more regular basis.  She reports that her blood sugar this morning was 161.  She continues to tolerate treatment with Nivolumab fairly well.  The patient is here for evaluation prior to cycle #3 of her treatment.  MEDICAL HISTORY: Past Medical History:  Diagnosis Date  . Arthritis    "in my fingers"   . CAD (coronary artery disease)    a. MI 1999 with 2 stents at that time followed by stenting 3-4 yrs later. b. s/p DES to LAD guided by pressure wire 03/11/12.  . Colon polyps    adenomatous  . Depression   . Diabetes mellitus, type 2 ([place])   . Hyperlipemia   . Hypertension   . Osteoarthritis   . Pneumonia 06/2017  . Skin cancer 2012   "nose; right middle finger"  . Stroke [facility])     ALLERGIES:  is allergic to codeine and morphine and related.  MEDICATIONS:  Current  Outpatient Medications  Medication Sig Dispense Refill  . ALPRAZolam (XANAX) 0.25 MG tablet Take 0.25 mg by mouth at bedtime as needed for anxiety.    Marland Kitchen amLODipine (NORVASC) 10 MG tablet Take 1 tablet (10 mg total) by mouth daily. 30 tablet 0  . aspirin 81 MG tablet Take 1 tablet (81 mg total) by mouth daily. (Patient taking differently: Take 81 mg by mouth as directed. On mondays and Thursdays) 30 tablet 1  . atorvastatin (LIPITOR) 80 MG tablet Take 80 mg by mouth daily.    . clopidogrel (PLAVIX) 75 MG tablet Take 75 mg by mouth daily.    . fluticasone (FLONASE) 50 MCG/ACT nasal spray PLACE 1 SPRAY INTO BOTH NOSTRILS DAILY. 16 g 2  . guaiFENesin (MUCINEX) 600 MG 12 hr tablet Take 2 tablets (1,200 mg total) by mouth 2 (two) times daily as needed (Use for one week post discharge, then twice daily as needed).    Marland Kitchen HUMALOG KWIKPEN 100 UNIT/ML KiwkPen 0-8 units three times daily after meals  2  . ipratropium-albuterol (DUONEB) 0.5-2.5 (3) MG/3ML SOLN Take 3 mLs by nebulization every 6 (six) hours as needed (Shortness of breath or wheezing). 360 mL 3  . lactose free nutrition (BOOST PLUS) LIQD Take 237 mLs by mouth 3 (three) times daily between meals. 14 Can 0  . lidocaine-prilocaine (EMLA) cream Apply 1 application topically as needed. Apply topically to port site 1-2 hours prior to chemotherapy or labs. Cover with plastic wrap. [facility]  g 0  . loratadine (CLARITIN) 10 MG tablet Take 10 mg by mouth daily.    . metFORMIN (GLUCOPHAGE) 1000 MG tablet Take 1,000 mg by mouth daily.     . metoprolol succinate (TOPROL-XL) 50 MG 24 hr tablet Take 50 mg by mouth daily. Take with or immediately following a meal.    . mirtazapine (REMERON) 15 MG tablet Take 15 mg by mouth at bedtime.    . nitroGLYCERIN (NITROSTAT) 0.4 MG SL tablet Place 1 tablet (0.4 mg total) under the tongue every 5 (five) minutes as needed (up to 3 doses). For chest pain    . ondansetron (ZOFRAN ODT) 4 MG disintegrating tablet Take 1 tablet (4 mg  total) by mouth every 8 (eight) hours as needed for nausea or vomiting. 20 tablet 3  . prochlorperazine (COMPAZINE) 10 MG tablet Take 1 tablet (10 mg total) by mouth every 6 (six) hours as needed for nausea or vomiting. 30 tablet 0  . promethazine (PHENERGAN) 12.5 MG tablet Take 1 tablet (12.5 mg total) by mouth every 4 (four) hours as needed for nausea or vomiting. 20 tablet 0  . sitaGLIPtin (JANUVIA) 100 MG tablet Take 0.5 tablets (50 mg total) by mouth daily. 14 tablet 0  . sodium chloride (OCEAN) 0.65 % SOLN nasal spray Place 1 spray into both nostrils as needed for congestion (use twice daily for one week post discharge, then as needed).  0   No current facility-administered medications for this visit.     SURGICAL HISTORY:  Past Surgical History:  Procedure Laterality Date  . APPENDECTOMY  ~ 1965  . CATARACT EXTRACTION    . CORONARY ANGIOPLASTY WITH STENT PLACEMENT  1999; 2004; 03/11/2012   "2 + 1 + 1; total of 4" (03/11/2012)  . FEMUR FRACTURE SURGERY  2011   RLE (03/11/2012)  . IR FLUORO GUIDE PORT INSERTION RIGHT  07/18/2017  . IR US GUIDE VASC ACCESS RIGHT  07/18/2017  . PERCUTANEOUS CORONARY STENT INTERVENTION (PCI-S) Katherine Cole 03/11/2012   Procedure: PERCUTANEOUS CORONARY STENT INTERVENTION (PCI-S);  Surgeon: Sherren Mocha, MD;  Location: [facility] CATH LAB;  Service: Cardiovascular;  Laterality: Katherine Cole;  . SKIN CANCER EXCISION  2013   "nose & right middle finger; actinic keratosis" (03/11/2012)  . TONSILLECTOMY AND ADENOIDECTOMY  ~ 1962  . TUBAL LIGATION  1977  . VIDEO BRONCHOSCOPY WITH ENDOBRONCHIAL ULTRASOUND Katherine Cole 07/04/2017   Procedure: VIDEO BRONCHOSCOPY WITH ENDOBRONCHIAL ULTRASOUND;  Surgeon: Collene Gobble, MD;  Location: [facility] OR;  Service: Thoracic;  Laterality: Katherine Cole;    REVIEW OF SYSTEMS:   Review of Systems  Constitutional: Negative for appetite change, chills, fatigue, fever.  Positive for weight loss. HENT:   Negative for mouth sores, nosebleeds, sore throat and trouble swallowing.    Eyes: Negative for eye problems and icterus.  Respiratory: Negative for cough, hemoptysis, shortness of breath and wheezing.   Cardiovascular: Negative for chest pain and leg swelling.  Gastrointestinal: Negative for abdominal pain, constipation, diarrhea, nausea and vomiting.  Genitourinary: Negative for bladder incontinence, difficulty urinating, dysuria, frequency and hematuria.   Musculoskeletal: Negative for back pain, gait problem, neck pain and neck stiffness.  Skin: Negative for itching and rash.  Neurological: Negative for dizziness, extremity weakness, gait problem, headaches, light-headedness and seizures.  Hematological: Negative for adenopathy. Does not bruise/bleed easily.  Psychiatric/Behavioral: Negative for confusion, depression and sleep disturbance. The patient is not nervous/anxious.     PHYSICAL EXAMINATION:  Blood pressure (!) 150/60, pulse 75, temperature 98.3 F (36.8 C), temperature source Oral,  resp. rate 17, weight 77 lb 1.6 oz (35 kg), SpO2 99 %.  ECOG PERFORMANCE STATUS: 1 - Symptomatic but completely ambulatory  Physical Exam  Constitutional: Oriented to person, place, and time. No distress.  HENT:  Head: Normocephalic and atraumatic.  Mouth/Throat: Oropharynx is clear and moist. No oropharyngeal exudate.  Eyes: Conjunctivae are normal. Right eye exhibits no discharge. Left eye exhibits no discharge. No scleral icterus.  Neck: Normal range of motion. Neck supple.  Cardiovascular: Normal rate, regular rhythm, normal heart sounds and intact distal pulses.   Pulmonary/Chest: Effort normal and breath sounds normal. No respiratory distress. No wheezes. No rales.  Abdominal: Soft. Bowel sounds are normal. Exhibits no distension and no mass. There is no tenderness.  Musculoskeletal: Normal range of motion. Exhibits no edema.  Lymphadenopathy:    No cervical adenopathy.  Neurological: Alert and oriented to person, place, and time. Exhibits normal muscle tone.  Gait normal. Coordination normal.  Skin: Skin is warm and dry. No rash noted. Not diaphoretic. No erythema. No pallor.  Psychiatric: Mood, memory and judgment normal.  Vitals reviewed.  LABORATORY DATA: Lab Results  Component Value Date   WBC 7.2 01/13/2018   HGB 11.8 (L) 01/13/2018   HCT 33.6 (L) 01/13/2018   MCV 92.1 01/13/2018   PLT 254 01/13/2018      Chemistry      Component Value Date/Time   NA 131 (L) 01/13/2018 1128   K 4.2 01/13/2018 1128   CL 93 (L) 01/13/2018 1128   CO2 27 01/13/2018 1128   BUN 15 01/13/2018 1128   CREATININE 0.82 01/13/2018 1128      Component Value Date/Time   CALCIUM 9.3 01/13/2018 1128   ALKPHOS 135 (H) 01/13/2018 1128   AST 27 01/13/2018 1128   ALT 26 01/13/2018 1128   BILITOT 0.7 01/13/2018 1128       RADIOGRAPHIC STUDIES:  No results found.   ASSESSMENT/PLAN:  Primary small cell carcinoma of left lung ([place]) This is a very pleasant 77year old white female recently diagnosed with extensive stage small cell lung cancer and currently undergoing systemic chemotherapy with carboplatin and etoposide status post 6cycles. She has a rough time with the previous course of chemotherapy with significant pancytopenia as well as chemotherapy-induced anemia requiring PRBCs transfusion.  Following her chemotherapy, her scan showed evidence for disease progression. She is now on Nivolumab 240 mg IV every 2 weeks.  Status post 2 cycles which she tolerated fairly well with no concerning complaints. Recommend for her to proceed with cycle 3 of her treatment today as scheduled.  She will follow-up in 2 weeks for evaluation prior to cycle #4.  She was advised to call immediately if she has any concerning symptoms in the interval. The patient voices understanding of current disease status and treatment options and is in agreement with the current care plan.  All questions were answered. The patient knows to call the clinic with any problems,  questions or concerns. We can certainly see the patient much sooner if necessary.     Mikey Bussing, DNP, AGPCNP-BC, AOCNP 01/13/18

## 2018-01-13 NOTE — Assessment & Plan Note (Signed)
This is a very pleasant 77year old white female recently diagnosed with extensive stage small cell lung cancer and currently undergoing systemic chemotherapy with carboplatin and etoposide status post 6cycles. She has a rough time with the previous course of chemotherapy with significant pancytopenia as well as chemotherapy-induced anemia requiring PRBCs transfusion.  Following her chemotherapy, her scan showed evidence for disease progression. She is now on Nivolumab 240 mg IV every 2 weeks.  Status post 2 cycles which she tolerated fairly well with no concerning complaints. Recommend for her to proceed with cycle 3 of her treatment today as scheduled.  She will follow-up in 2 weeks for evaluation prior to cycle #4.  She was advised to call immediately if she has any concerning symptoms in the interval. The patient voices understanding of current disease status and treatment options and is in agreement with the current care plan.  All questions were answered. The patient knows to call the clinic with any problems, questions or concerns. We can certainly see the patient much sooner if necessary.

## 2018-01-13 NOTE — Patient Instructions (Signed)
[facility] Discharge Instructions for Patients Receiving Chemotherapy  Today you received the following chemotherapy agents: nivolumab.  To help prevent nausea and vomiting after your treatment, we encourage you to take your nausea medication as directed.   If you develop nausea and vomiting that is not controlled by your nausea medication, call the clinic.   BELOW ARE SYMPTOMS THAT SHOULD BE REPORTED IMMEDIATELY:  *FEVER GREATER THAN 100.5 F  *CHILLS WITH OR WITHOUT FEVER  NAUSEA AND VOMITING THAT IS NOT CONTROLLED WITH YOUR NAUSEA MEDICATION  *UNUSUAL SHORTNESS OF BREATH  *UNUSUAL BRUISING OR BLEEDING  TENDERNESS IN MOUTH AND THROAT WITH OR WITHOUT PRESENCE OF ULCERS  *URINARY PROBLEMS  *BOWEL PROBLEMS  UNUSUAL RASH Items with * indicate a potential emergency and should be followed up as soon as possible.  Feel free to call the clinic should you have any questions or concerns. The clinic phone number is [phone].  Please show the CHEMO ALERT CARD at check-in to the Emergency Department and triage nurse.   

## 2018-01-13 NOTE — Telephone Encounter (Signed)
Scheduled appt per 10/8 los - pt to get an updated schedule next visit.   

## 2018-01-28 ENCOUNTER — Inpatient Hospital Stay: Payer: Medicare Other

## 2018-01-28 ENCOUNTER — Other Ambulatory Visit: Payer: Self-pay | Admitting: *Deleted

## 2018-01-28 ENCOUNTER — Inpatient Hospital Stay (HOSPITAL_BASED_OUTPATIENT_CLINIC_OR_DEPARTMENT_OTHER): Payer: Medicare Other | Admitting: Oncology

## 2018-01-28 ENCOUNTER — Encounter: Payer: Self-pay | Admitting: Oncology

## 2018-01-28 VITALS — BP 133/53 | HR 66

## 2018-01-28 VITALS — BP 193/75 | HR 64 | Temp 97.8°F | Resp 18 | Ht 61.0 in | Wt 77.5 lb

## 2018-01-28 DIAGNOSIS — D61818 Other pancytopenia: Secondary | ICD-10-CM | POA: Diagnosis not present

## 2018-01-28 DIAGNOSIS — C3490 Malignant neoplasm of unspecified part of unspecified bronchus or lung: Secondary | ICD-10-CM

## 2018-01-28 DIAGNOSIS — D6481 Anemia due to antineoplastic chemotherapy: Secondary | ICD-10-CM | POA: Diagnosis not present

## 2018-01-28 DIAGNOSIS — C3492 Malignant neoplasm of unspecified part of left bronchus or lung: Secondary | ICD-10-CM

## 2018-01-28 DIAGNOSIS — C787 Secondary malignant neoplasm of liver and intrahepatic bile duct: Secondary | ICD-10-CM

## 2018-01-28 DIAGNOSIS — C7951 Secondary malignant neoplasm of bone: Secondary | ICD-10-CM

## 2018-01-28 DIAGNOSIS — Z5112 Encounter for antineoplastic immunotherapy: Secondary | ICD-10-CM | POA: Diagnosis not present

## 2018-01-28 DIAGNOSIS — C3412 Malignant neoplasm of upper lobe, left bronchus or lung: Secondary | ICD-10-CM

## 2018-01-28 LAB — CBC WITH DIFFERENTIAL (CANCER CENTER ONLY)
Abs Immature Granulocytes: 0.02 10*3/uL (ref 0.00–0.07)
BASOS ABS: 0 10*3/uL (ref 0.0–0.1)
Basophils Relative: 0 %
EOS ABS: 0 10*3/uL (ref 0.0–0.5)
EOS PCT: 0 %
HCT: 33.4 % — ABNORMAL LOW (ref 36.0–46.0)
HEMOGLOBIN: 11.3 g/dL — AB (ref 12.0–15.0)
IMMATURE GRANULOCYTES: 0 %
LYMPHS PCT: 36 %
Lymphs Abs: 2.4 10*3/uL (ref 0.7–4.0)
MCH: 31.8 pg (ref 26.0–34.0)
MCHC: 33.8 g/dL (ref 30.0–36.0)
MCV: 94.1 fL (ref 80.0–100.0)
Monocytes Absolute: 0.4 10*3/uL (ref 0.1–1.0)
Monocytes Relative: 6 %
NEUTROS PCT: 58 %
NRBC: 0 % (ref 0.0–0.2)
Neutro Abs: 3.9 10*3/uL (ref 1.7–7.7)
Platelet Count: 261 10*3/uL (ref 150–400)
RBC: 3.55 MIL/uL — AB (ref 3.87–5.11)
RDW: 15.9 % — AB (ref 11.5–15.5)
WBC: 6.8 10*3/uL (ref 4.0–10.5)

## 2018-01-28 LAB — CMP (CANCER CENTER ONLY)
ALBUMIN: 3.1 g/dL — AB (ref 3.5–5.0)
ALT: 23 U/L (ref 0–44)
ANION GAP: 12 (ref 5–15)
AST: 24 U/L (ref 15–41)
Alkaline Phosphatase: 127 U/L — ABNORMAL HIGH (ref 38–126)
BUN: 15 mg/dL (ref 8–23)
CO2: 25 mmol/L (ref 22–32)
Calcium: 9.2 mg/dL (ref 8.9–10.3)
Chloride: 95 mmol/L — ABNORMAL LOW (ref 98–111)
Creatinine: 0.8 mg/dL (ref 0.44–1.00)
GFR, Est AFR Am: >60 mL/min (ref >60)
GFR, Estimated: >60 mL/min (ref >60)
GLUCOSE: 202 mg/dL — AB (ref 70–99)
Potassium: 3.9 mmol/L (ref 3.5–5.1)
SODIUM: 132 mmol/L — AB (ref 135–145)
Total Bilirubin: 0.4 mg/dL (ref 0.3–1.2)
Total Protein: 7.1 g/dL (ref 6.5–8.1)

## 2018-01-28 MED ORDER — SODIUM CHLORIDE 0.9 % IV SOLN
240.0000 mg | Freq: Once | INTRAVENOUS | Status: AC
  Administered 2018-01-28: 240 mg via INTRAVENOUS
  Filled 2018-01-28: qty 24

## 2018-01-28 MED ORDER — SODIUM CHLORIDE 0.9 % IV SOLN
Freq: Once | INTRAVENOUS | Status: AC
  Administered 2018-01-28: 15:00:00 via INTRAVENOUS
  Filled 2018-01-28: qty 250

## 2018-01-28 MED ORDER — HEPARIN SOD (PORK) LOCK FLUSH 100 UNIT/ML IV SOLN
500.0000 [IU] | Freq: Once | INTRAVENOUS | Status: AC | PRN
  Administered 2018-01-28: 500 [IU]
  Filled 2018-01-28: qty 5

## 2018-01-28 MED ORDER — SODIUM CHLORIDE 0.9% FLUSH
10.0000 mL | INTRAVENOUS | Status: DC | PRN
  Administered 2018-01-28: 10 mL
  Filled 2018-01-28: qty 10

## 2018-01-28 NOTE — Progress Notes (Signed)
[facility] OFFICE PROGRESS NOTE  [name], [name], [name] [address]  DIAGNOSIS:Extensive stage (T2a, N2, M1c) small cell lung cancer presented with subpleural mass within the left upper lobe with associated ipsilateral hilar and mediastinal nodal metastasis, liver metastasis and bone metastasis  PRIOR THERAPY: Systemic chemotherapy with carboplatin for AUC of 5 on day 1 and etoposide 100 mg/M2 on days 1, 2 and 3 with Neulasta support every 3 weeks.Status post 6 cycles, last dose was given8/08/2017.  CURRENT THERAPY:Nivolumab 240 mg IV every 2 weeks beginning 12/17/2017.Status post 3cycles.  INTERVAL HISTORY: Katherine Cole 77 y.o. female returns for routine follow-up visit accompanied by her daughters.  The patient is feeling fine today and has no specific complaints but has noticed a lump in her left axilla.  She notices over the past few days.  The area is not tender.  She is unable to move this area.  She denies fevers and chills.  Denies chest pain, shortness of breath, cough, hemoptysis.  Denies nausea, vomiting, constipation, diarrhea.  Denies recent weight loss or night sweats.  The patient is here for evaluation prior to cycle #4 of nivolumab.  MEDICAL HISTORY: Past Medical History:  Diagnosis Date  . Arthritis    "in my fingers"   . CAD (coronary artery disease)    a. MI 1999 with 2 stents at that time followed by stenting 3-4 yrs later. b. s/p DES to LAD guided by pressure wire 03/11/12.  . Colon polyps    adenomatous  . Depression   . Diabetes mellitus, type 2 ([place])   . Hyperlipemia   . Hypertension   . Osteoarthritis   . Pneumonia 06/2017  . Skin cancer 2012   "nose; right middle finger"  . Stroke [facility])     ALLERGIES:  is allergic to codeine and morphine and related.  MEDICATIONS:  Current Outpatient Medications  Medication Sig Dispense Refill  . ALPRAZolam (XANAX) 0.25 MG tablet Take 0.25 mg by mouth at bedtime as  needed for anxiety.    Marland Kitchen amLODipine (NORVASC) 10 MG tablet Take 1 tablet (10 mg total) by mouth daily. 30 tablet 0  . aspirin 81 MG tablet Take 1 tablet (81 mg total) by mouth daily. (Patient taking differently: Take 81 mg by mouth as directed. On mondays and Thursdays) 30 tablet 1  . HUMALOG KWIKPEN 100 UNIT/ML KiwkPen 0-8 units three times daily after meals  2  . ipratropium-albuterol (DUONEB) 0.5-2.5 (3) MG/3ML SOLN Take 3 mLs by nebulization every 6 (six) hours as needed (Shortness of breath or wheezing). 360 mL 3  . lactose free nutrition (BOOST PLUS) LIQD Take 237 mLs by mouth 3 (three) times daily between meals. 14 Can 0  . lidocaine-prilocaine (EMLA) cream Apply 1 application topically as needed. Apply topically to port site 1-2 hours prior to chemotherapy or labs. Cover with plastic wrap. 30 g 0  . loratadine (CLARITIN) 10 MG tablet Take 10 mg by mouth daily.    . metFORMIN (GLUCOPHAGE) 1000 MG tablet Take 1,000 mg by mouth daily.     . metoprolol succinate (TOPROL-XL) 50 MG 24 hr tablet Take 50 mg by mouth daily. Take with or immediately following a meal.    . sitaGLIPtin (JANUVIA) 100 MG tablet Take 0.5 tablets (50 mg total) by mouth daily. 14 tablet 0  . sodium chloride (OCEAN) 0.65 % SOLN nasal spray Place 1 spray into both nostrils as needed for congestion (use twice daily for one week post  discharge, then as needed).  0  . atorvastatin (LIPITOR) 80 MG tablet Take 80 mg by mouth daily.    . clopidogrel (PLAVIX) 75 MG tablet Take 75 mg by mouth daily.    . fluticasone (FLONASE) 50 MCG/ACT nasal spray PLACE 1 SPRAY INTO BOTH NOSTRILS DAILY. (Patient not taking: Reported on 01/13/2018) 16 g 2  . guaiFENesin (MUCINEX) 600 MG 12 hr tablet Take 2 tablets (1,200 mg total) by mouth 2 (two) times daily as needed (Use for one week post discharge, then twice daily as needed). (Patient not taking: Reported on 01/13/2018)    . mirtazapine (REMERON) 15 MG tablet Take 15 mg by mouth at bedtime.    .  nitroGLYCERIN (NITROSTAT) 0.4 MG SL tablet Place 1 tablet (0.4 mg total) under the tongue every 5 (five) minutes as needed (up to 3 doses). For chest pain (Patient not taking: Reported on 01/28/2018)    . ondansetron (ZOFRAN ODT) 4 MG disintegrating tablet Take 1 tablet (4 mg total) by mouth every 8 (eight) hours as needed for nausea or vomiting. (Patient not taking: Reported on 01/13/2018) 20 tablet 3  . prochlorperazine (COMPAZINE) 10 MG tablet Take 1 tablet (10 mg total) by mouth every 6 (six) hours as needed for nausea or vomiting. (Patient not taking: Reported on 01/13/2018) 30 tablet 0  . promethazine (PHENERGAN) 12.5 MG tablet Take 1 tablet (12.5 mg total) by mouth every 4 (four) hours as needed for nausea or vomiting. (Patient not taking: Reported on 01/13/2018) 20 tablet 0   No current facility-administered medications for this visit.    Facility-Administered Medications Ordered in Other Visits  Medication Dose Route Frequency Provider Last Rate Last Dose  . sodium chloride flush (NS) 0.9 % injection 10 mL  10 mL Intracatheter PRN Curt Bears, MD   10 mL at 01/28/18 1555    SURGICAL HISTORY:  Past Surgical History:  Procedure Laterality Date  . APPENDECTOMY  ~ 1965  . CATARACT EXTRACTION    . CORONARY ANGIOPLASTY WITH STENT PLACEMENT  1999; 2004; 03/11/2012   "2 + 1 + 1; total of 4" (03/11/2012)  . FEMUR FRACTURE SURGERY  2011   RLE (03/11/2012)  . IR FLUORO GUIDE PORT INSERTION RIGHT  07/18/2017  . IR US GUIDE VASC ACCESS RIGHT  07/18/2017  . PERCUTANEOUS CORONARY STENT INTERVENTION (PCI-S) N/A 03/11/2012   Procedure: PERCUTANEOUS CORONARY STENT INTERVENTION (PCI-S);  Surgeon: Sherren Mocha, MD;  Location: [facility] CATH LAB;  Service: Cardiovascular;  Laterality: N/A;  . SKIN CANCER EXCISION  2013   "nose & right middle finger; actinic keratosis" (03/11/2012)  . TONSILLECTOMY AND ADENOIDECTOMY  ~ 1962  . TUBAL LIGATION  1977  . VIDEO BRONCHOSCOPY WITH ENDOBRONCHIAL ULTRASOUND N/A  07/04/2017   Procedure: VIDEO BRONCHOSCOPY WITH ENDOBRONCHIAL ULTRASOUND;  Surgeon: Collene Gobble, MD;  Location: [facility] OR;  Service: Thoracic;  Laterality: N/A;    REVIEW OF SYSTEMS:   Review of Systems  Constitutional: Negative for appetite change, chills, fatigue, fever and unexpected weight change.  HENT:   Negative for mouth sores, nosebleeds, sore throat and trouble swallowing.   Eyes: Negative for eye problems and icterus.  Respiratory: Negative for cough, hemoptysis, shortness of breath and wheezing.   Cardiovascular: Negative for chest pain and leg swelling.  Gastrointestinal: Negative for abdominal pain, constipation, diarrhea, nausea and vomiting.  Genitourinary: Negative for bladder incontinence, difficulty urinating, dysuria, frequency and hematuria.   Musculoskeletal: Negative for back pain, gait problem, neck pain and neck stiffness.  Skin: Negative for  itching and rash.  Neurological: Negative for dizziness, extremity weakness, gait problem, headaches, light-headedness and seizures.  Hematological: Positive for enlarged lymph node in her left axilla. Psychiatric/Behavioral: Negative for confusion, depression and sleep disturbance. The patient is not nervous/anxious.     PHYSICAL EXAMINATION:  Blood pressure (!) 193/75, pulse 64, temperature 97.8 F (36.6 C), temperature source Oral, resp. rate 18, height 5\' 1"  (1.549 m), weight 77 lb 8 oz (35.2 kg), SpO2 100 %.  ECOG PERFORMANCE STATUS: 1 - Symptomatic but completely ambulatory  Physical Exam  Constitutional: Oriented to person, place, and time. No distress.  HENT:  Head: Normocephalic and atraumatic.  Mouth/Throat: Oropharynx is clear and moist. No oropharyngeal exudate.  Eyes: Conjunctivae are normal. Right eye exhibits no discharge. Left eye exhibits no discharge. No scleral icterus.  Neck: Normal range of motion. Neck supple.  Cardiovascular: Normal rate, regular rhythm, normal heart sounds and intact distal pulses.    Pulmonary/Chest: Effort normal and breath sounds normal. No respiratory distress. No wheezes. No rales.  Abdominal: Soft. Bowel sounds are normal. Exhibits no distension and no mass. There is no tenderness.  Musculoskeletal: Normal range of motion. Exhibits no edema.  Lymphadenopathy:    No cervical adenopathy.  Left axillary lymph node palpable and approximately three-quarter centimeter in diameter. Neurological: Alert and oriented to person, place, and time. Exhibits normal muscle tone. Gait normal. Coordination normal.  Skin: Skin is warm and dry. No rash noted. Not diaphoretic. No erythema. No pallor.  Psychiatric: Mood, memory and judgment normal.  Vitals reviewed.  LABORATORY DATA: Lab Results  Component Value Date   WBC 6.8 01/28/2018   HGB 11.3 (L) 01/28/2018   HCT 33.4 (L) 01/28/2018   MCV 94.1 01/28/2018   PLT 261 01/28/2018      Chemistry      Component Value Date/Time   NA 132 (L) 01/28/2018 1307   K 3.9 01/28/2018 1307   CL 95 (L) 01/28/2018 1307   CO2 25 01/28/2018 1307   BUN 15 01/28/2018 1307   CREATININE 0.80 01/28/2018 1307      Component Value Date/Time   CALCIUM 9.2 01/28/2018 1307   ALKPHOS 127 (H) 01/28/2018 1307   AST 24 01/28/2018 1307   ALT 23 01/28/2018 1307   BILITOT 0.4 01/28/2018 1307       RADIOGRAPHIC STUDIES:  No results found.   ASSESSMENT/PLAN:  Primary small cell carcinoma of left lung ([place]) This is a very pleasant 77year old white female recently diagnosed with extensive stage small cell lung cancer and currently undergoing systemic chemotherapy with carboplatin and etoposide status post 6cycles. She has a rough time with the previous course of chemotherapy with significant pancytopenia as well as chemotherapy-induced anemia requiring PRBCs transfusion. Following her chemotherapy,her scan showed evidence for disease progression. She is now onNivolumab 240 mg IV every 2 weeks.Status post 3 cycles which she tolerated  fairly well with no concerning complaints. Recommend for her to proceed with cycle 4 of her treatment today as scheduled.  The patient has a palpable lymph node in the left axilla.  Discussed with the patient that we will follow this closely on her upcoming CT scan.  Patient will have a restaging CT scan of the chest, abdomen, pelvis prior to her next visit.  She will follow-up in 2 weeks for evaluation prior to cycle #5 and to review her restaging CT scan results.  She was advised to call immediately if she has any concerning symptoms in the interval. The patient voices understanding  of current disease status and treatment options and is in agreement with the current care plan.  All questions were answered. The patient knows to call the clinic with any problems, questions or concerns. We can certainly see the patient much sooner if necessary.   Orders Placed This Encounter  Procedures  . CT ABDOMEN PELVIS W CONTRAST    Standing Status:   Future    Standing Expiration Date:   01/29/2019    Order Specific Question:   If indicated for the ordered procedure, I authorize the administration of contrast media per Radiology protocol    Answer:   Yes    Order Specific Question:   Preferred imaging location?    Answer:   [facility]    Order Specific Question:   Radiology Contrast Protocol - do NOT remove file path    Answer:   \\[email or web address]    Order Specific Question:   ** REASON FOR EXAM (FREE TEXT)    Answer:   Lung cancer. Restaging.  . CT CHEST W CONTRAST    Standing Status:   Future    Standing Expiration Date:   01/29/2019    Order Specific Question:   If indicated for the ordered procedure, I authorize the administration of contrast media per Radiology protocol    Answer:   Yes    Order Specific Question:   Preferred imaging location?    Answer:   [facility]    Order Specific Question:   Radiology Contrast Protocol - do NOT remove  file path    Answer:   \\[email or web address]    Order Specific Question:   ** REASON FOR EXAM (FREE TEXT)    Answer:   Lung cancer. Restaging.  Marland Kitchen CMP ([place] only)    Standing Status:   Standing    Number of Occurrences:   20    Standing Expiration Date:   01/29/2019     Mikey Bussing, DNP, AGPCNP-BC, AOCNP 01/28/18

## 2018-01-28 NOTE — Assessment & Plan Note (Addendum)
This is a very pleasant 77year old white female recently diagnosed with extensive stage small cell lung cancer and currently undergoing systemic chemotherapy with carboplatin and etoposide status post 6cycles. She has a rough time with the previous course of chemotherapy with significant pancytopenia as well as chemotherapy-induced anemia requiring PRBCs transfusion. Following her chemotherapy,her scan showed evidence for disease progression. She is now onNivolumab 240 mg IV every 2 weeks.Status post 3 cycles which she tolerated fairly well with no concerning complaints. Recommend for her to proceed with cycle 4 of her treatment today as scheduled.  The patient has a palpable lymph node in the left axilla.  Discussed with the patient that we will follow this closely on her upcoming CT scan.  Patient will have a restaging CT scan of the chest, abdomen, pelvis prior to her next visit.  She will follow-up in 2 weeks for evaluation prior to cycle #5 and to review her restaging CT scan results.  She was advised to call immediately if she has any concerning symptoms in the interval. The patient voices understanding of current disease status and treatment options and is in agreement with the current care plan.  All questions were answered. The patient knows to call the clinic with any problems, questions or concerns. We can certainly see the patient much sooner if necessary.

## 2018-01-28 NOTE — Patient Instructions (Signed)
[facility] Discharge Instructions for Patients Receiving Chemotherapy  Today you received the following chemotherapy agents: nivolumab.  To help prevent nausea and vomiting after your treatment, we encourage you to take your nausea medication as directed.   If you develop nausea and vomiting that is not controlled by your nausea medication, call the clinic.   BELOW ARE SYMPTOMS THAT SHOULD BE REPORTED IMMEDIATELY:  *FEVER GREATER THAN 100.5 F  *CHILLS WITH OR WITHOUT FEVER  NAUSEA AND VOMITING THAT IS NOT CONTROLLED WITH YOUR NAUSEA MEDICATION  *UNUSUAL SHORTNESS OF BREATH  *UNUSUAL BRUISING OR BLEEDING  TENDERNESS IN MOUTH AND THROAT WITH OR WITHOUT PRESENCE OF ULCERS  *URINARY PROBLEMS  *BOWEL PROBLEMS  UNUSUAL RASH Items with * indicate a potential emergency and should be followed up as soon as possible.  Feel free to call the clinic should you have any questions or concerns. The clinic phone number is [phone].  Please show the CHEMO ALERT CARD at check-in to the Emergency Department and triage nurse.   

## 2018-01-29 ENCOUNTER — Telehealth: Payer: Self-pay | Admitting: Oncology

## 2018-01-29 NOTE — Telephone Encounter (Signed)
Appts already scheduled per 10/23 los - and treatment plan - no additional appts added.

## 2018-01-31 ENCOUNTER — Other Ambulatory Visit: Payer: Self-pay | Admitting: Internal Medicine

## 2018-02-09 ENCOUNTER — Ambulatory Visit (HOSPITAL_COMMUNITY)
Admission: RE | Admit: 2018-02-09 | Discharge: 2018-02-09 | Disposition: A | Discharge status: Home / Self Care | Payer: Medicare Other | Source: Ambulatory Visit | Attending: Internal Medicine | Admitting: Internal Medicine

## 2018-02-09 ENCOUNTER — Encounter (HOSPITAL_COMMUNITY): Payer: Self-pay

## 2018-02-09 DIAGNOSIS — C3412 Malignant neoplasm of upper lobe, left bronchus or lung: Secondary | ICD-10-CM | POA: Diagnosis not present

## 2018-02-09 DIAGNOSIS — K7689 Other specified diseases of liver: Secondary | ICD-10-CM | POA: Insufficient documentation

## 2018-02-09 DIAGNOSIS — R590 Localized enlarged lymph nodes: Secondary | ICD-10-CM | POA: Insufficient documentation

## 2018-02-09 DIAGNOSIS — C3492 Malignant neoplasm of unspecified part of left bronchus or lung: Secondary | ICD-10-CM

## 2018-02-09 DIAGNOSIS — I700 Atherosclerosis of aorta: Secondary | ICD-10-CM | POA: Diagnosis not present

## 2018-02-09 MED ORDER — IOHEXOL 300 MG/ML  SOLN
75.0000 mL | Freq: Once | INTRAMUSCULAR | Status: AC | PRN
  Administered 2018-02-09: 75 mL via INTRAVENOUS

## 2018-02-09 MED ORDER — SODIUM CHLORIDE 0.9 % IJ SOLN
INTRAMUSCULAR | Status: AC
  Filled 2018-02-09: qty 50

## 2018-02-11 ENCOUNTER — Encounter: Payer: Self-pay | Admitting: Internal Medicine

## 2018-02-11 ENCOUNTER — Inpatient Hospital Stay: Payer: Medicare Other

## 2018-02-11 ENCOUNTER — Inpatient Hospital Stay (HOSPITAL_BASED_OUTPATIENT_CLINIC_OR_DEPARTMENT_OTHER): Payer: Medicare Other | Admitting: Internal Medicine

## 2018-02-11 ENCOUNTER — Inpatient Hospital Stay: Payer: Medicare Other | Admitting: Nutrition

## 2018-02-11 ENCOUNTER — Telehealth: Payer: Self-pay | Admitting: Internal Medicine

## 2018-02-11 ENCOUNTER — Inpatient Hospital Stay: Payer: Medicare Other | Attending: Internal Medicine

## 2018-02-11 VITALS — BP 159/69 | HR 68 | Temp 98.5°F | Resp 18 | Ht 61.0 in | Wt 78.4 lb

## 2018-02-11 DIAGNOSIS — I10 Essential (primary) hypertension: Secondary | ICD-10-CM | POA: Diagnosis not present

## 2018-02-11 DIAGNOSIS — R599 Enlarged lymph nodes, unspecified: Secondary | ICD-10-CM

## 2018-02-11 DIAGNOSIS — C3412 Malignant neoplasm of upper lobe, left bronchus or lung: Secondary | ICD-10-CM

## 2018-02-11 DIAGNOSIS — C787 Secondary malignant neoplasm of liver and intrahepatic bile duct: Secondary | ICD-10-CM

## 2018-02-11 DIAGNOSIS — R5382 Chronic fatigue, unspecified: Secondary | ICD-10-CM

## 2018-02-11 DIAGNOSIS — C3492 Malignant neoplasm of unspecified part of left bronchus or lung: Secondary | ICD-10-CM

## 2018-02-11 DIAGNOSIS — C7951 Secondary malignant neoplasm of bone: Secondary | ICD-10-CM

## 2018-02-11 DIAGNOSIS — Z5112 Encounter for antineoplastic immunotherapy: Secondary | ICD-10-CM

## 2018-02-11 DIAGNOSIS — Z95828 Presence of other vascular implants and grafts: Secondary | ICD-10-CM

## 2018-02-11 LAB — CBC WITH DIFFERENTIAL (CANCER CENTER ONLY)
ABS IMMATURE GRANULOCYTES: 0.03 10*3/uL (ref 0.00–0.07)
BASOS ABS: 0 10*3/uL (ref 0.0–0.1)
BASOS PCT: 1 %
EOS ABS: 0 10*3/uL (ref 0.0–0.5)
Eosinophils Relative: 1 %
HCT: 32.3 % — ABNORMAL LOW (ref 36.0–46.0)
Hemoglobin: 11 g/dL — ABNORMAL LOW (ref 12.0–15.0)
IMMATURE GRANULOCYTES: 0 %
Lymphocytes Relative: 25 %
Lymphs Abs: 1.9 10*3/uL (ref 0.7–4.0)
MCH: 32.1 pg (ref 26.0–34.0)
MCHC: 34.1 g/dL (ref 30.0–36.0)
MCV: 94.2 fL (ref 80.0–100.0)
Monocytes Absolute: 0.5 10*3/uL (ref 0.1–1.0)
Monocytes Relative: 7 %
NEUTROS ABS: 5 10*3/uL (ref 1.7–7.7)
NEUTROS PCT: 66 %
NRBC: 0 % (ref 0.0–0.2)
PLATELETS: 237 10*3/uL (ref 150–400)
RBC: 3.43 MIL/uL — AB (ref 3.87–5.11)
RDW: 15.4 % (ref 11.5–15.5)
WBC Count: 7.6 10*3/uL (ref 4.0–10.5)

## 2018-02-11 LAB — CMP (CANCER CENTER ONLY)
ALBUMIN: 3.2 g/dL — AB (ref 3.5–5.0)
ALT: 26 U/L (ref 0–44)
ANION GAP: 8 (ref 5–15)
AST: 22 U/L (ref 15–41)
Alkaline Phosphatase: 122 U/L (ref 38–126)
BILIRUBIN TOTAL: 0.4 mg/dL (ref 0.3–1.2)
BUN: 19 mg/dL (ref 8–23)
CALCIUM: 9 mg/dL (ref 8.9–10.3)
CO2: 29 mmol/L (ref 22–32)
Chloride: 94 mmol/L — ABNORMAL LOW (ref 98–111)
Creatinine: 0.93 mg/dL (ref 0.44–1.00)
GFR, Estimated: 59 mL/min — ABNORMAL LOW (ref >60)
GLUCOSE: 313 mg/dL — AB (ref 70–99)
Potassium: 4.4 mmol/L (ref 3.5–5.1)
Sodium: 131 mmol/L — ABNORMAL LOW (ref 135–145)
TOTAL PROTEIN: 6.7 g/dL (ref 6.5–8.1)

## 2018-02-11 LAB — TSH: TSH: 1.856 u[IU]/mL (ref 0.308–3.960)

## 2018-02-11 MED ORDER — SODIUM CHLORIDE 0.9 % IV SOLN
Freq: Once | INTRAVENOUS | Status: AC
  Administered 2018-02-11: 11:00:00 via INTRAVENOUS
  Filled 2018-02-11: qty 250

## 2018-02-11 MED ORDER — SODIUM CHLORIDE 0.9% FLUSH
10.0000 mL | INTRAVENOUS | Status: DC | PRN
  Administered 2018-02-11: 10 mL
  Filled 2018-02-11: qty 10

## 2018-02-11 MED ORDER — SODIUM CHLORIDE 0.9 % IV SOLN
240.0000 mg | Freq: Once | INTRAVENOUS | Status: AC
  Administered 2018-02-11: 240 mg via INTRAVENOUS
  Filled 2018-02-11: qty 24

## 2018-02-11 MED ORDER — HEPARIN SOD (PORK) LOCK FLUSH 100 UNIT/ML IV SOLN
500.0000 [IU] | Freq: Once | INTRAVENOUS | Status: AC | PRN
  Administered 2018-02-11: 500 [IU]
  Filled 2018-02-11: qty 5

## 2018-02-11 MED ORDER — SODIUM CHLORIDE 0.9% FLUSH
10.0000 mL | Freq: Once | INTRAVENOUS | Status: AC
  Administered 2018-02-11: 10 mL
  Filled 2018-02-11: qty 10

## 2018-02-11 NOTE — Progress Notes (Signed)
Nutrition Follow-up:  Patient with lung cancer currently on nivolumab.    Met with patient and daughter in infusion.  Patient drinking hot chocolate.  Continues to report appetite is good.  Drinking boost high protein shakes 2 a day.    No nutrition impact symptoms reported    Medications: reviewed, remeron  Labs: glucose 313  Anthropometrics:   Weight increased to 78 lb 6.4 oz today from 77 lb on 10/8   NUTRITION DIAGNOSIS: Inadequate oral intake continues   MALNUTRITION DIAGNOSIS: severe malnutrition continues   INTERVENTION:  Encouraged boost plus with higher calories vs lower calorie shakes to promote weight gain. Encouraged high calorie, high protein foods for continued weight gain    MONITORING, EVALUATION, GOAL: patient will tolerate increased calories and protein to promote weight gain   NEXT VISIT: Wednesday, Dec 18 during infusion  Azlynn Mitnick B. Zenia Resides, [place], [place] Registered Dietitian [phone] (pager)

## 2018-02-11 NOTE — Telephone Encounter (Signed)
Appts scheduled avs/calendar printed per 11/6 los °

## 2018-02-11 NOTE — Progress Notes (Signed)
[place] [phone]   [phone]  OFFICE PROGRESS NOTE  Prince Solian, MD [place] [address]  DIAGNOSIS: Extensive stage (T2a, N2, M1c) small cell lung cancer presented with subpleural mass within the left upper lobe with associated ipsilateral hilar and mediastinal nodal metastasis, liver metastasis and bone metastasis  PRIOR THERAPY:Systemic chemotherapy with carboplatin for AUC of 5 on day 1 and etoposide 100 mg/M2 on days 1, 2 and 3 with Neulasta support every 3 weeks.  Status post 6 cycles, last dose was given 11/10/2017.   CURRENT THERAPY: Nivolumab 240 mg IV every 2 weeks beginning 12/17/2017.Status post5cycles.  INTERVAL HISTORY: Katherine Cole 78 y.o. female returns to the clinic today for follow-up visit accompanied by her daughter.  The patient is feeling fine today with no specific complaints.  She denied having any significant weight loss or night sweats.  She has no nausea, vomiting, diarrhea or constipation.  She denied having any skin rash.  The patient denied having any chest pain, shortness of breath, cough or hemoptysis.  She continues to tolerate her treatment with Nivolumab fairly well.  She had repeat CT scan of the chest, abdomen and pelvis performed recently and she is here for evaluation and discussion of her risk her results.  MEDICAL HISTORY: Past Medical History:  Diagnosis Date  . Arthritis    "in my fingers"   . CAD (coronary artery disease)    a. MI 1999 with 2 stents at that time followed by stenting 3-4 yrs later. b. s/p DES to LAD guided by pressure wire 03/11/12.  . Colon polyps    adenomatous  . Depression   . Diabetes mellitus, type 2 ([place])   . Hyperlipemia   . Hypertension   . Osteoarthritis   . Pneumonia 06/2017  . Skin cancer 2012   "nose; right middle finger"  . Stroke [facility])     ALLERGIES:  is allergic to codeine and morphine and related.  MEDICATIONS:  Current  Outpatient Medications  Medication Sig Dispense Refill  . ALPRAZolam (XANAX) 0.25 MG tablet Take 0.25 mg by mouth at bedtime as needed for anxiety.    Marland Kitchen amLODipine (NORVASC) 10 MG tablet Take 1 tablet (10 mg total) by mouth daily. 30 tablet 0  . aspirin 81 MG tablet Take 1 tablet (81 mg total) by mouth daily. (Patient taking differently: Take 81 mg by mouth as directed. On mondays and Thursdays) 30 tablet 1  . atorvastatin (LIPITOR) 80 MG tablet Take 80 mg by mouth daily.    . clopidogrel (PLAVIX) 75 MG tablet Take 75 mg by mouth daily.    . fluticasone (FLONASE) 50 MCG/ACT nasal spray PLACE 1 SPRAY INTO BOTH NOSTRILS DAILY. 16 g 0  . guaiFENesin (MUCINEX) 600 MG 12 hr tablet Take 2 tablets (1,200 mg total) by mouth 2 (two) times daily as needed (Use for one week post discharge, then twice daily as needed).    Marland Kitchen HUMALOG KWIKPEN 100 UNIT/ML KiwkPen 0-8 units three times daily after meals  2  . ipratropium-albuterol (DUONEB) 0.5-2.5 (3) MG/3ML SOLN Take 3 mLs by nebulization every 6 (six) hours as needed (Shortness of breath or wheezing). 360 mL 3  . lactose free nutrition (BOOST PLUS) LIQD Take 237 mLs by mouth 3 (three) times daily between meals. 14 Can 0  . lidocaine-prilocaine (EMLA) cream Apply 1 application topically as needed. Apply topically to port site 1-2 hours prior to chemotherapy or labs. Cover with  plastic wrap. 30 g 0  . loratadine (CLARITIN) 10 MG tablet Take 10 mg by mouth daily.    . metFORMIN (GLUCOPHAGE) 1000 MG tablet Take 1,000 mg by mouth daily.     . metoprolol succinate (TOPROL-XL) 50 MG 24 hr tablet Take 50 mg by mouth daily. Take with or immediately following a meal.    . mirtazapine (REMERON) 15 MG tablet Take 15 mg by mouth at bedtime.    . nitroGLYCERIN (NITROSTAT) 0.4 MG SL tablet Place 1 tablet (0.4 mg total) under the tongue every 5 (five) minutes as needed (up to 3 doses). For chest pain    . ondansetron (ZOFRAN ODT) 4 MG disintegrating tablet Take 1 tablet (4 mg  total) by mouth every 8 (eight) hours as needed for nausea or vomiting. 20 tablet 3  . prochlorperazine (COMPAZINE) 10 MG tablet Take 1 tablet (10 mg total) by mouth every 6 (six) hours as needed for nausea or vomiting. 30 tablet 0  . promethazine (PHENERGAN) 12.5 MG tablet Take 1 tablet (12.5 mg total) by mouth every 4 (four) hours as needed for nausea or vomiting. 20 tablet 0  . sitaGLIPtin (JANUVIA) 100 MG tablet Take 0.5 tablets (50 mg total) by mouth daily. 14 tablet 0  . sodium chloride (OCEAN) 0.65 % SOLN nasal spray Place 1 spray into both nostrils as needed for congestion (use twice daily for one week post discharge, then as needed).  0  . ALPRAZolam (XANAX) 0.5 MG tablet TAKE 1/2 TO 1 TABLET BY MOUTH TWICE A DAY AS NEEDED FOR ANXIETY  2   No current facility-administered medications for this visit.     SURGICAL HISTORY:  Past Surgical History:  Procedure Laterality Date  . APPENDECTOMY  ~ 1965  . CATARACT EXTRACTION    . CORONARY ANGIOPLASTY WITH STENT PLACEMENT  1999; 2004; 03/11/2012   "2 + 1 + 1; total of 4" (03/11/2012)  . FEMUR FRACTURE SURGERY  2011   RLE (03/11/2012)  . IR FLUORO GUIDE PORT INSERTION RIGHT  07/18/2017  . IR US GUIDE VASC ACCESS RIGHT  07/18/2017  . PERCUTANEOUS CORONARY STENT INTERVENTION (PCI-S) N/A 03/11/2012   Procedure: PERCUTANEOUS CORONARY STENT INTERVENTION (PCI-S);  Surgeon: Sherren Mocha, MD;  Location: [facility] CATH LAB;  Service: Cardiovascular;  Laterality: N/A;  . SKIN CANCER EXCISION  2013   "nose & right middle finger; actinic keratosis" (03/11/2012)  . TONSILLECTOMY AND ADENOIDECTOMY  ~ 1962  . TUBAL LIGATION  1977  . VIDEO BRONCHOSCOPY WITH ENDOBRONCHIAL ULTRASOUND N/A 07/04/2017   Procedure: VIDEO BRONCHOSCOPY WITH ENDOBRONCHIAL ULTRASOUND;  Surgeon: Collene Gobble, MD;  Location: [facility] OR;  Service: Thoracic;  Laterality: N/A;    REVIEW OF SYSTEMS:  Constitutional: negative Eyes: negative Ears, nose, mouth, throat, and face:  negative Respiratory: negative Cardiovascular: negative Gastrointestinal: negative Genitourinary:negative Integument/breast: negative Hematologic/lymphatic: negative Musculoskeletal:positive for arthralgias Neurological: negative Behavioral/Psych: negative Endocrine: negative Allergic/Immunologic: negative   PHYSICAL EXAMINATION: General appearance: alert, cooperative and no distress Head: Normocephalic, without obvious abnormality, atraumatic Neck: no adenopathy, no JVD, supple, symmetrical, trachea midline and thyroid not enlarged, symmetric, no tenderness/mass/nodules Lymph nodes: Axillary adenopathy: 2 palpable lymph nodes in the left axilla Resp: clear to auscultation bilaterally Back: symmetric, no curvature. ROM normal. No CVA tenderness. Cardio: regular rate and rhythm, S1, S2 normal, no murmur, click, rub or gallop GI: soft, non-tender; bowel sounds normal; no masses,  no organomegaly Extremities: extremities normal, atraumatic, no cyanosis or edema Neurologic: Alert and oriented X 3, normal strength and tone. Normal  symmetric reflexes. Normal coordination and gait  ECOG PERFORMANCE STATUS: 1 - Symptomatic but completely ambulatory  Blood pressure (!) 159/69, pulse 68, temperature 98.5 F (36.9 C), temperature source Oral, resp. rate 18, height 5\' 1"  (1.549 m), weight 78 lb 6.4 oz (35.6 kg), SpO2 100 %.  LABORATORY DATA: Lab Results  Component Value Date   WBC 6.8 01/28/2018   HGB 11.3 (L) 01/28/2018   HCT 33.4 (L) 01/28/2018   MCV 94.1 01/28/2018   PLT 261 01/28/2018      Chemistry      Component Value Date/Time   NA 132 (L) 01/28/2018 1307   K 3.9 01/28/2018 1307   CL 95 (L) 01/28/2018 1307   CO2 25 01/28/2018 1307   BUN 15 01/28/2018 1307   CREATININE 0.80 01/28/2018 1307      Component Value Date/Time   CALCIUM 9.2 01/28/2018 1307   ALKPHOS 127 (H) 01/28/2018 1307   AST 24 01/28/2018 1307   ALT 23 01/28/2018 1307   BILITOT 0.4 01/28/2018 1307        RADIOGRAPHIC STUDIES: Ct Chest W Contrast  Result Date: 02/09/2018 CLINICAL DATA:  Left upper lobe small cell lung cancer. EXAM: CT CHEST, ABDOMEN, AND PELVIS WITH CONTRAST TECHNIQUE: Multidetector CT imaging of the chest, abdomen and pelvis was performed following the standard protocol during bolus administration of intravenous contrast. CONTRAST:  59mL OMNIPAQUE IOHEXOL 300 MG/ML  SOLN COMPARISON:  12/01/2017 FINDINGS: CT CHEST FINDINGS Cardiovascular: The heart size is normal. No substantial pericardial effusion. Coronary artery calcification is evident. Atherosclerotic calcification is noted in the wall of the thoracic aorta. Right Port-A-Cath tip is positioned in the distal SVC. Mediastinum/Nodes: No mediastinal lymphadenopathy. 19 mm short axis AP window lymph node seen previously is now 7 mm short axis. Left hilar lymphadenopathy seen on the previous exam has decreased with previous 17 mm short axis left hilar node now measuring 7 mm. There is no hilar lymphadenopathy. The esophagus has normal imaging features. 10 x 10 mm left axillary lymph node (16/2) has progressed in the interval. There is an 8 mm short axis left subpectoral lymph node (13/2), new since prior.Tiny bilateral thyroid nodules again noted. Lungs/Pleura: Secretions noted in the trachea. Peripheral left upper lobe lesion measured previously at 2.0 x 3.4 cm now measures 1.9 x 3.2 cm. The more central left upper lobe nodule described as "elongated" on the prior study has decreased in the interval measuring 2.3 x 0.5 cm today compared to 3.0 x 1.0 cm previously. Areas of small airway impaction noted peripheral left upper lobe, as before. Scattered bronchiectasis in the right middle lobe and lingula is similar. Airway impaction noted in the left lower lobe and right middle lobe. 6 mm posterior left lower lobe nodule (103/7) is stable. Other scattered tiny bilateral pulmonary nodules show no substantial change. No edema or pleural  effusion. Musculoskeletal: No worrisome lytic or sclerotic osseous abnormality. CT ABDOMEN PELVIS FINDINGS Hepatobiliary: Subcapsular low-density lesion in the dome of the left liver is similar to prior measuring 1.1 x 1.5 cm today (49/2) compared to 1.0 x 1.4 cm previously. No other suspicious focal liver abnormality evident. There is no evidence for gallstones, gallbladder wall thickening, or pericholecystic fluid. No intrahepatic or extrahepatic biliary dilation. Pancreas: No focal mass lesion. No dilatation of the main duct. No intraparenchymal cyst. No peripancreatic edema. Spleen: No splenomegaly. No focal mass lesion. Adrenals/Urinary Tract: No adrenal nodule or mass. Right kidney unremarkable. Tiny hypodensity upper pole left kidney is similar to prior and  probably a tiny cyst. No evidence for hydroureter. The urinary bladder appears normal for the degree of distention. Stomach/Bowel: Stomach is nondistended. No gastric wall thickening. No evidence of outlet obstruction. Duodenum is normally positioned as is the ligament of Treitz. No small bowel wall thickening. No small bowel dilatation. The terminal ileum is normal. The appendix is not visualized, but there is no edema or inflammation in the region of the cecum. No gross colonic mass. No colonic wall thickening. No substantial diverticular change. Vascular/Lymphatic: Fusiform ectasia infrarenal abdominal aorta is similar to prior measuring 3.2 x 2.9 cm today compared to 3.2 x 2.8 cm previously. There is no gastrohepatic or hepatoduodenal ligament lymphadenopathy. No intraperitoneal or retroperitoneal lymphadenopathy. No pelvic sidewall lymphadenopathy. Reproductive: Unremarkable. Other: No intraperitoneal free fluid. Musculoskeletal: Orthopedic hardware noted right proximal femur. No worrisome lytic or sclerotic osseous abnormality. IMPRESSION: 1. Interval next response to therapy. 2. Interval development of new mild lymphadenopathy in the left axilla and  left subpectoral space, concerning for disease progression. 3. AP window and left hilar lymphadenopathy has clearly decreased in the interval. 4. Peripheral left upper lobe lesion is similar to prior while the more central elongated left upper lobe lesion has decreased in the interval. 5. Stable appearance low-density lesion segment 2 of the left liver. 6.  Aortic Atherosclerois (ICD10-170.0) Electronically Signed   By: Misty Stanley M.D.   On: 02/09/2018 13:04   Ct Abdomen Pelvis W Contrast  Result Date: 02/09/2018 CLINICAL DATA:  Left upper lobe small cell lung cancer. EXAM: CT CHEST, ABDOMEN, AND PELVIS WITH CONTRAST TECHNIQUE: Multidetector CT imaging of the chest, abdomen and pelvis was performed following the standard protocol during bolus administration of intravenous contrast. CONTRAST:  63mL OMNIPAQUE IOHEXOL 300 MG/ML  SOLN COMPARISON:  12/01/2017 FINDINGS: CT CHEST FINDINGS Cardiovascular: The heart size is normal. No substantial pericardial effusion. Coronary artery calcification is evident. Atherosclerotic calcification is noted in the wall of the thoracic aorta. Right Port-A-Cath tip is positioned in the distal SVC. Mediastinum/Nodes: No mediastinal lymphadenopathy. 19 mm short axis AP window lymph node seen previously is now 7 mm short axis. Left hilar lymphadenopathy seen on the previous exam has decreased with previous 17 mm short axis left hilar node now measuring 7 mm. There is no hilar lymphadenopathy. The esophagus has normal imaging features. 10 x 10 mm left axillary lymph node (16/2) has progressed in the interval. There is an 8 mm short axis left subpectoral lymph node (13/2), new since prior.Tiny bilateral thyroid nodules again noted. Lungs/Pleura: Secretions noted in the trachea. Peripheral left upper lobe lesion measured previously at 2.0 x 3.4 cm now measures 1.9 x 3.2 cm. The more central left upper lobe nodule described as "elongated" on the prior study has decreased in the interval  measuring 2.3 x 0.5 cm today compared to 3.0 x 1.0 cm previously. Areas of small airway impaction noted peripheral left upper lobe, as before. Scattered bronchiectasis in the right middle lobe and lingula is similar. Airway impaction noted in the left lower lobe and right middle lobe. 6 mm posterior left lower lobe nodule (103/7) is stable. Other scattered tiny bilateral pulmonary nodules show no substantial change. No edema or pleural effusion. Musculoskeletal: No worrisome lytic or sclerotic osseous abnormality. CT ABDOMEN PELVIS FINDINGS Hepatobiliary: Subcapsular low-density lesion in the dome of the left liver is similar to prior measuring 1.1 x 1.5 cm today (49/2) compared to 1.0 x 1.4 cm previously. No other suspicious focal liver abnormality evident. There is no evidence for  gallstones, gallbladder wall thickening, or pericholecystic fluid. No intrahepatic or extrahepatic biliary dilation. Pancreas: No focal mass lesion. No dilatation of the main duct. No intraparenchymal cyst. No peripancreatic edema. Spleen: No splenomegaly. No focal mass lesion. Adrenals/Urinary Tract: No adrenal nodule or mass. Right kidney unremarkable. Tiny hypodensity upper pole left kidney is similar to prior and probably a tiny cyst. No evidence for hydroureter. The urinary bladder appears normal for the degree of distention. Stomach/Bowel: Stomach is nondistended. No gastric wall thickening. No evidence of outlet obstruction. Duodenum is normally positioned as is the ligament of Treitz. No small bowel wall thickening. No small bowel dilatation. The terminal ileum is normal. The appendix is not visualized, but there is no edema or inflammation in the region of the cecum. No gross colonic mass. No colonic wall thickening. No substantial diverticular change. Vascular/Lymphatic: Fusiform ectasia infrarenal abdominal aorta is similar to prior measuring 3.2 x 2.9 cm today compared to 3.2 x 2.8 cm previously. There is no gastrohepatic or  hepatoduodenal ligament lymphadenopathy. No intraperitoneal or retroperitoneal lymphadenopathy. No pelvic sidewall lymphadenopathy. Reproductive: Unremarkable. Other: No intraperitoneal free fluid. Musculoskeletal: Orthopedic hardware noted right proximal femur. No worrisome lytic or sclerotic osseous abnormality. IMPRESSION: 1. Interval next response to therapy. 2. Interval development of new mild lymphadenopathy in the left axilla and left subpectoral space, concerning for disease progression. 3. AP window and left hilar lymphadenopathy has clearly decreased in the interval. 4. Peripheral left upper lobe lesion is similar to prior while the more central elongated left upper lobe lesion has decreased in the interval. 5. Stable appearance low-density lesion segment 2 of the left liver. 6.  Aortic Atherosclerois (ICD10-170.0) Electronically Signed   By: Misty Stanley M.D.   On: 02/09/2018 13:04    ASSESSMENT AND PLAN: This is a very pleasant 77 years old white female recently diagnosed with extensive stage small cell lung cancer. She underwent systemic chemotherapy with carboplatin and etoposide status post 6 cycles with initial partial response followed by disease progression. The patient is currently undergoing treatment with Nivolumab 240 mg IV every 2 weeks status post 5 cycles.  She has been tolerating this treatment well with no concerning adverse effects. She had repeat CT scan of the chest, abdomen and pelvis performed recently.  I personally and independently reviewed the scans and discussed the results with the patient and her daughter today. Her scan showed mixed response with decrease in the AP window as well as left hilar lymphadenopathy but there was new mild lymphadenopathy in the left axilla and left subpectoral space. I recommended for the patient to continue her current treatment with Nivolumab for now.  Her other option will be consideration of palliative care and hospice which the patient  is not willing to do at this point. I will see her back for follow-up visit in 2 weeks for evaluation before starting cycle #7. For hypertension she will continue with her current blood pressure medication and monitor it closely at home. She was advised to call immediately if she has any concerning symptoms in the interval. The patient voices understanding of current disease status and treatment options and is in agreement with the current care plan.  All questions were answered. The patient knows to call the clinic with any problems, questions or concerns. We can certainly see the patient much sooner if necessary.  Disclaimer: This note was dictated with voice recognition software. Similar sounding words can inadvertently be transcribed and may not be corrected upon review.

## 2018-02-11 NOTE — Patient Instructions (Signed)
[facility] Discharge Instructions for Patients Receiving Chemotherapy  Today you received the following chemotherapy agents: nivolumab.  To help prevent nausea and vomiting after your treatment, we encourage you to take your nausea medication as directed.   If you develop nausea and vomiting that is not controlled by your nausea medication, call the clinic.   BELOW ARE SYMPTOMS THAT SHOULD BE REPORTED IMMEDIATELY:  *FEVER GREATER THAN 100.5 F  *CHILLS WITH OR WITHOUT FEVER  NAUSEA AND VOMITING THAT IS NOT CONTROLLED WITH YOUR NAUSEA MEDICATION  *UNUSUAL SHORTNESS OF BREATH  *UNUSUAL BRUISING OR BLEEDING  TENDERNESS IN MOUTH AND THROAT WITH OR WITHOUT PRESENCE OF ULCERS  *URINARY PROBLEMS  *BOWEL PROBLEMS  UNUSUAL RASH Items with * indicate a potential emergency and should be followed up as soon as possible.  Feel free to call the clinic should you have any questions or concerns. The clinic phone number is [phone].  Please show the CHEMO ALERT CARD at check-in to the Emergency Department and triage nurse.   

## 2018-02-14 ENCOUNTER — Inpatient Hospital Stay (HOSPITAL_COMMUNITY)
Admission: EM | Admit: 2018-02-14 | Discharge: 2018-02-18 | DRG: 871 | Disposition: A | Discharge status: Home / Self Care | Payer: Medicare Other | Attending: Internal Medicine | Admitting: Internal Medicine

## 2018-02-14 ENCOUNTER — Other Ambulatory Visit: Payer: Self-pay

## 2018-02-14 ENCOUNTER — Emergency Department (HOSPITAL_COMMUNITY): Payer: Medicare Other

## 2018-02-14 ENCOUNTER — Encounter (HOSPITAL_COMMUNITY): Payer: Self-pay

## 2018-02-14 DIAGNOSIS — E43 Unspecified severe protein-calorie malnutrition: Secondary | ICD-10-CM

## 2018-02-14 DIAGNOSIS — Z85828 Personal history of other malignant neoplasm of skin: Secondary | ICD-10-CM | POA: Diagnosis not present

## 2018-02-14 DIAGNOSIS — E46 Unspecified protein-calorie malnutrition: Secondary | ICD-10-CM

## 2018-02-14 DIAGNOSIS — F17210 Nicotine dependence, cigarettes, uncomplicated: Secondary | ICD-10-CM | POA: Diagnosis present

## 2018-02-14 DIAGNOSIS — Z8673 Personal history of transient ischemic attack (TIA), and cerebral infarction without residual deficits: Secondary | ICD-10-CM

## 2018-02-14 DIAGNOSIS — R64 Cachexia: Secondary | ICD-10-CM | POA: Diagnosis present

## 2018-02-14 DIAGNOSIS — I2510 Atherosclerotic heart disease of native coronary artery without angina pectoris: Secondary | ICD-10-CM | POA: Diagnosis present

## 2018-02-14 DIAGNOSIS — G936 Cerebral edema: Secondary | ICD-10-CM | POA: Diagnosis present

## 2018-02-14 DIAGNOSIS — Z66 Do not resuscitate: Secondary | ICD-10-CM | POA: Diagnosis present

## 2018-02-14 DIAGNOSIS — R4182 Altered mental status, unspecified: Secondary | ICD-10-CM | POA: Diagnosis not present

## 2018-02-14 DIAGNOSIS — C3490 Malignant neoplasm of unspecified part of unspecified bronchus or lung: Secondary | ICD-10-CM | POA: Diagnosis not present

## 2018-02-14 DIAGNOSIS — Z794 Long term (current) use of insulin: Secondary | ICD-10-CM | POA: Diagnosis not present

## 2018-02-14 DIAGNOSIS — K219 Gastro-esophageal reflux disease without esophagitis: Secondary | ICD-10-CM | POA: Diagnosis present

## 2018-02-14 DIAGNOSIS — Y95 Nosocomial condition: Secondary | ICD-10-CM | POA: Diagnosis present

## 2018-02-14 DIAGNOSIS — E871 Hypo-osmolality and hyponatremia: Secondary | ICD-10-CM

## 2018-02-14 DIAGNOSIS — C7931 Secondary malignant neoplasm of brain: Secondary | ICD-10-CM | POA: Diagnosis present

## 2018-02-14 DIAGNOSIS — D630 Anemia in neoplastic disease: Secondary | ICD-10-CM | POA: Diagnosis present

## 2018-02-14 DIAGNOSIS — J449 Chronic obstructive pulmonary disease, unspecified: Secondary | ICD-10-CM | POA: Diagnosis present

## 2018-02-14 DIAGNOSIS — I252 Old myocardial infarction: Secondary | ICD-10-CM | POA: Diagnosis not present

## 2018-02-14 DIAGNOSIS — C3412 Malignant neoplasm of upper lobe, left bronchus or lung: Secondary | ICD-10-CM | POA: Diagnosis present

## 2018-02-14 DIAGNOSIS — E119 Type 2 diabetes mellitus without complications: Secondary | ICD-10-CM

## 2018-02-14 DIAGNOSIS — J188 Other pneumonia, unspecified organism: Secondary | ICD-10-CM | POA: Diagnosis present

## 2018-02-14 DIAGNOSIS — F329 Major depressive disorder, single episode, unspecified: Secondary | ICD-10-CM | POA: Diagnosis present

## 2018-02-14 DIAGNOSIS — Z86010 Personal history of colonic polyps: Secondary | ICD-10-CM

## 2018-02-14 DIAGNOSIS — F419 Anxiety disorder, unspecified: Secondary | ICD-10-CM

## 2018-02-14 DIAGNOSIS — A419 Sepsis, unspecified organism: Principal | ICD-10-CM

## 2018-02-14 DIAGNOSIS — Z681 Body mass index (BMI) 19 or less, adult: Secondary | ICD-10-CM | POA: Diagnosis not present

## 2018-02-14 DIAGNOSIS — D649 Anemia, unspecified: Secondary | ICD-10-CM | POA: Diagnosis not present

## 2018-02-14 DIAGNOSIS — Z79899 Other long term (current) drug therapy: Secondary | ICD-10-CM

## 2018-02-14 DIAGNOSIS — J189 Pneumonia, unspecified organism: Secondary | ICD-10-CM | POA: Diagnosis present

## 2018-02-14 DIAGNOSIS — Z7951 Long term (current) use of inhaled steroids: Secondary | ICD-10-CM

## 2018-02-14 DIAGNOSIS — Z7902 Long term (current) use of antithrombotics/antiplatelets: Secondary | ICD-10-CM

## 2018-02-14 DIAGNOSIS — Z7982 Long term (current) use of aspirin: Secondary | ICD-10-CM

## 2018-02-14 DIAGNOSIS — R739 Hyperglycemia, unspecified: Secondary | ICD-10-CM | POA: Diagnosis not present

## 2018-02-14 DIAGNOSIS — Z833 Family history of diabetes mellitus: Secondary | ICD-10-CM

## 2018-02-14 DIAGNOSIS — J440 Chronic obstructive pulmonary disease with acute lower respiratory infection: Secondary | ICD-10-CM | POA: Diagnosis present

## 2018-02-14 DIAGNOSIS — E785 Hyperlipidemia, unspecified: Secondary | ICD-10-CM | POA: Diagnosis present

## 2018-02-14 DIAGNOSIS — I10 Essential (primary) hypertension: Secondary | ICD-10-CM | POA: Diagnosis present

## 2018-02-14 DIAGNOSIS — Z23 Encounter for immunization: Secondary | ICD-10-CM | POA: Diagnosis not present

## 2018-02-14 DIAGNOSIS — C3492 Malignant neoplasm of unspecified part of left bronchus or lung: Secondary | ICD-10-CM | POA: Diagnosis not present

## 2018-02-14 DIAGNOSIS — Z955 Presence of coronary angioplasty implant and graft: Secondary | ICD-10-CM

## 2018-02-14 DIAGNOSIS — Z885 Allergy status to narcotic agent status: Secondary | ICD-10-CM

## 2018-02-14 DIAGNOSIS — R5383 Other fatigue: Secondary | ICD-10-CM | POA: Diagnosis not present

## 2018-02-14 DIAGNOSIS — R05 Cough: Secondary | ICD-10-CM | POA: Diagnosis not present

## 2018-02-14 DIAGNOSIS — Z9221 Personal history of antineoplastic chemotherapy: Secondary | ICD-10-CM

## 2018-02-14 DIAGNOSIS — Z801 Family history of malignant neoplasm of trachea, bronchus and lung: Secondary | ICD-10-CM

## 2018-02-14 DIAGNOSIS — E1165 Type 2 diabetes mellitus with hyperglycemia: Secondary | ICD-10-CM | POA: Diagnosis not present

## 2018-02-14 DIAGNOSIS — R531 Weakness: Secondary | ICD-10-CM | POA: Diagnosis not present

## 2018-02-14 LAB — COMPREHENSIVE METABOLIC PANEL
ALT: 20 U/L (ref 0–44)
AST: 16 U/L (ref 15–41)
Albumin: 3 g/dL — ABNORMAL LOW (ref 3.5–5.0)
Alkaline Phosphatase: 92 U/L (ref 38–126)
Anion gap: 8 (ref 5–15)
BILIRUBIN TOTAL: 0.6 mg/dL (ref 0.3–1.2)
BUN: 19 mg/dL (ref 8–23)
CHLORIDE: 95 mmol/L — AB (ref 98–111)
CO2: 27 mmol/L (ref 22–32)
CREATININE: 0.91 mg/dL (ref 0.44–1.00)
Calcium: 8.5 mg/dL — ABNORMAL LOW (ref 8.9–10.3)
GFR calc Af Amer: >60 mL/min (ref >60)
GLUCOSE: 273 mg/dL — AB (ref 70–99)
POTASSIUM: 3.6 mmol/L (ref 3.5–5.1)
Sodium: 130 mmol/L — ABNORMAL LOW (ref 135–145)
Total Protein: 6.7 g/dL (ref 6.5–8.1)

## 2018-02-14 LAB — CBC WITH DIFFERENTIAL/PLATELET
Abs Immature Granulocytes: 0.06 10*3/uL (ref 0.00–0.07)
BASOS ABS: 0 10*3/uL (ref 0.0–0.1)
Basophils Relative: 0 %
EOS ABS: 0 10*3/uL (ref 0.0–0.5)
EOS PCT: 0 %
HCT: 33 % — ABNORMAL LOW (ref 36.0–46.0)
Hemoglobin: 10.7 g/dL — ABNORMAL LOW (ref 12.0–15.0)
Immature Granulocytes: 1 %
Lymphocytes Relative: 20 %
Lymphs Abs: 2.2 10*3/uL (ref 0.7–4.0)
MCH: 32.1 pg (ref 26.0–34.0)
MCHC: 32.4 g/dL (ref 30.0–36.0)
MCV: 99.1 fL (ref 80.0–100.0)
MONO ABS: 0.7 10*3/uL (ref 0.1–1.0)
Monocytes Relative: 6 %
NRBC: 0 % (ref 0.0–0.2)
Neutro Abs: 7.9 10*3/uL — ABNORMAL HIGH (ref 1.7–7.7)
Neutrophils Relative %: 73 %
Platelets: 212 10*3/uL (ref 150–400)
RBC: 3.33 MIL/uL — ABNORMAL LOW (ref 3.87–5.11)
RDW: 15.3 % (ref 11.5–15.5)
WBC: 10.9 10*3/uL — ABNORMAL HIGH (ref 4.0–10.5)

## 2018-02-14 LAB — I-STAT CG4 LACTIC ACID, ED: Lactic Acid, Venous: 1.12 mmol/L (ref 0.5–1.9)

## 2018-02-14 LAB — GLUCOSE, CAPILLARY: Glucose-Capillary: 271 mg/dL — ABNORMAL HIGH (ref 70–99)

## 2018-02-14 LAB — MAGNESIUM: MAGNESIUM: 1.5 mg/dL — AB (ref 1.7–2.4)

## 2018-02-14 LAB — CBG MONITORING, ED: GLUCOSE-CAPILLARY: 218 mg/dL — AB (ref 70–99)

## 2018-02-14 MED ORDER — ASPIRIN EC 81 MG PO TBEC
81.0000 mg | DELAYED_RELEASE_TABLET | ORAL | Status: DC
  Administered 2018-02-16: 81 mg via ORAL
  Filled 2018-02-14: qty 1

## 2018-02-14 MED ORDER — LORATADINE 10 MG PO TABS
10.0000 mg | ORAL_TABLET | Freq: Every day | ORAL | Status: DC
  Administered 2018-02-15 – 2018-02-18 (×4): 10 mg via ORAL
  Filled 2018-02-14 (×4): qty 1

## 2018-02-14 MED ORDER — ATORVASTATIN CALCIUM 40 MG PO TABS
80.0000 mg | ORAL_TABLET | Freq: Every day | ORAL | Status: DC
  Administered 2018-02-15 – 2018-02-18 (×4): 80 mg via ORAL
  Filled 2018-02-14 (×4): qty 2

## 2018-02-14 MED ORDER — SODIUM CHLORIDE 0.9% FLUSH
10.0000 mL | INTRAVENOUS | Status: DC | PRN
  Administered 2018-02-17 – 2018-02-18 (×2): 10 mL
  Filled 2018-02-14 (×2): qty 40

## 2018-02-14 MED ORDER — CLOPIDOGREL BISULFATE 75 MG PO TABS
75.0000 mg | ORAL_TABLET | Freq: Every day | ORAL | Status: DC
  Administered 2018-02-15 – 2018-02-18 (×4): 75 mg via ORAL
  Filled 2018-02-14 (×4): qty 1

## 2018-02-14 MED ORDER — DM-GUAIFENESIN ER 30-600 MG PO TB12
1.0000 | ORAL_TABLET | Freq: Two times a day (BID) | ORAL | Status: DC
  Administered 2018-02-14 – 2018-02-18 (×8): 1 via ORAL
  Filled 2018-02-14 (×8): qty 1

## 2018-02-14 MED ORDER — SALINE SPRAY 0.65 % NA SOLN
1.0000 | NASAL | Status: DC | PRN
  Filled 2018-02-14: qty 44

## 2018-02-14 MED ORDER — MAGNESIUM SULFATE 2 GM/50ML IV SOLN
2.0000 g | Freq: Once | INTRAVENOUS | Status: AC
  Administered 2018-02-14: 2 g via INTRAVENOUS
  Filled 2018-02-14: qty 50

## 2018-02-14 MED ORDER — ALPRAZOLAM 0.5 MG PO TABS
0.5000 mg | ORAL_TABLET | Freq: Every day | ORAL | Status: DC
  Administered 2018-02-14 – 2018-02-17 (×4): 0.5 mg via ORAL
  Filled 2018-02-14 (×4): qty 1

## 2018-02-14 MED ORDER — VANCOMYCIN HCL 500 MG IV SOLR
500.0000 mg | Freq: Once | INTRAVENOUS | Status: AC
  Administered 2018-02-14: 500 mg via INTRAVENOUS
  Filled 2018-02-14 (×3): qty 500

## 2018-02-14 MED ORDER — INSULIN GLARGINE 100 UNIT/ML ~~LOC~~ SOLN
10.0000 [IU] | Freq: Every day | SUBCUTANEOUS | Status: DC
  Filled 2018-02-14: qty 0.1

## 2018-02-14 MED ORDER — METOPROLOL SUCCINATE ER 50 MG PO TB24
50.0000 mg | ORAL_TABLET | Freq: Every day | ORAL | Status: DC
  Administered 2018-02-15 – 2018-02-18 (×4): 50 mg via ORAL
  Filled 2018-02-14 (×4): qty 1

## 2018-02-14 MED ORDER — FLUTICASONE PROPIONATE 50 MCG/ACT NA SUSP
1.0000 | Freq: Every day | NASAL | Status: DC
  Filled 2018-02-14: qty 16

## 2018-02-14 MED ORDER — BOOST PLUS PO LIQD
237.0000 mL | Freq: Three times a day (TID) | ORAL | Status: DC
  Administered 2018-02-14 – 2018-02-17 (×7): 237 mL via ORAL
  Filled 2018-02-14 (×14): qty 237

## 2018-02-14 MED ORDER — SODIUM CHLORIDE 0.9 % IV BOLUS
1000.0000 mL | Freq: Once | INTRAVENOUS | Status: AC
  Administered 2018-02-14: 1000 mL via INTRAVENOUS

## 2018-02-14 MED ORDER — AMLODIPINE BESYLATE 10 MG PO TABS
10.0000 mg | ORAL_TABLET | Freq: Every day | ORAL | Status: DC
  Administered 2018-02-15 – 2018-02-18 (×4): 10 mg via ORAL
  Filled 2018-02-14 (×4): qty 1

## 2018-02-14 MED ORDER — PROMETHAZINE HCL 25 MG PO TABS: 12.5000 mg | ORAL_TABLET | ORAL | Status: DC | PRN

## 2018-02-14 MED ORDER — SODIUM CHLORIDE 0.9 % IV SOLN
INTRAVENOUS | Status: AC
  Administered 2018-02-14: 23:00:00 via INTRAVENOUS

## 2018-02-14 MED ORDER — IPRATROPIUM-ALBUTEROL 0.5-2.5 (3) MG/3ML IN SOLN: 3.0000 mL | Freq: Four times a day (QID) | RESPIRATORY_TRACT | Status: DC | PRN

## 2018-02-14 MED ORDER — INSULIN ASPART 100 UNIT/ML ~~LOC~~ SOLN
0.0000 [IU] | Freq: Three times a day (TID) | SUBCUTANEOUS | Status: DC
  Administered 2018-02-15: 9 [IU] via SUBCUTANEOUS

## 2018-02-14 MED ORDER — ENOXAPARIN SODIUM 30 MG/0.3ML ~~LOC~~ SOLN
30.0000 mg | Freq: Every day | SUBCUTANEOUS | Status: DC
  Administered 2018-02-14 – 2018-02-17 (×4): 30 mg via SUBCUTANEOUS
  Filled 2018-02-14 (×4): qty 0.3

## 2018-02-14 MED ORDER — SODIUM CHLORIDE 0.9 % IV SOLN
1.0000 g | Freq: Once | INTRAVENOUS | Status: AC
  Administered 2018-02-14: 1 g via INTRAVENOUS
  Filled 2018-02-14: qty 1

## 2018-02-14 NOTE — ED Notes (Signed)
RN made aware of elevated BP 

## 2018-02-14 NOTE — ED Provider Notes (Signed)
[facility] DEPT Provider Note   CSN: [id number] Arrival date & time: 02/14/18  1734     History   Chief Complaint Chief Complaint  Patient presents with  . Lung Cancer  . Weakness    HPI Katherine Cole is a 77 y.o. female presenting for evaluation of cough, weakness, and tiredness.  Patient states she has a history of lung cancer.  Gets injections once every 2 weeks, last injection was 3 days ago.  Last night, patient started to feel very tired and weak.  She started coughing up a clearish yellowish sputum.  Today, patient has slept all day.  When she has tried to walk, she has been unable to due to weakness.  She is normally ambulatory and performs her ADLs independently.  Family said she is very off her baseline in terms of strength and energy.  Patient denies fever, chills, nausea, vomiting, abdominal pain, urinary symptoms, abnormal bowel movements.  She has a history of diabetes, BLG normal in the 300's.  Other PMH of lung cancer, CAD, CVA.  She is on Plavix and aspirin.  Additional history obtained from chart review. Pt getting nivolumab sections every other week.  Labs obtained 3 days ago reviewed, no concerning findings.  HPI  Past Medical History:  Diagnosis Date  . Arthritis    "in my fingers"   . CAD (coronary artery disease)    a. MI 1999 with 2 stents at that time followed by stenting 3-4 yrs later. b. s/p DES to LAD guided by pressure wire 03/11/12.  . Colon polyps    adenomatous  . Depression   . Diabetes mellitus, type 2 ([place])   . Hyperlipemia   . Hypertension   . Osteoarthritis   . Pneumonia 06/2017  . Skin cancer 2012   "nose; right middle finger"  . Stroke [facility])     Patient Active Problem List   Diagnosis Date Noted  . Multifocal pneumonia 02/14/2018  . Encounter for antineoplastic immunotherapy 12/03/2017  . COPD II still smoking  08/21/2017  . Dehydration 08/15/2017  . Antineoplastic chemotherapy induced  pancytopenia ([place]) 08/03/2017  . Pancytopenia ([place]) 08/03/2017  . DNR (do not resuscitate) 07/29/2017  . Port-A-Cath in place 07/22/2017  . Encounter for antineoplastic chemotherapy 07/12/2017  . Goals of care, counseling/discussion 07/12/2017  . Moderate protein-calorie malnutrition ([place]) 07/12/2017  . Encounter for smoking cessation counseling 07/12/2017  . 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 Primary malignant neoplasm of left upper lobe of lung ([place]) 07/08/2017  . Atypical pneumonia   . Acute CVA (cerebrovascular accident) ([place]) 01/20/2014  . Exertional angina ([place]) 03/12/2012  . CAD (coronary artery disease) 02/10/2012  . Cigarette smoker 02/10/2012  . Essential hypertension 02/10/2012  . Benign neoplasm of colon 08/01/2007  . Type 2 diabetes mellitus with hypoglycemia without coma ([place]) 08/01/2007  . Hyperlipidemia 08/01/2007  . GASTROESOPHAGEAL REFLUX DISEASE 08/01/2007  . OSTEOARTHRITIS 08/01/2007    Past Surgical History:  Procedure Laterality Date  . APPENDECTOMY  ~ 1965  . CATARACT EXTRACTION    . CORONARY ANGIOPLASTY WITH STENT PLACEMENT  1999; 2004; 03/11/2012   "2 + 1 + 1; total of 4" (03/11/2012)  . FEMUR FRACTURE SURGERY  2011   RLE (03/11/2012)  . IR FLUORO GUIDE PORT INSERTION RIGHT  07/18/2017  . IR US GUIDE VASC ACCESS RIGHT  07/18/2017  . PERCUTANEOUS CORONARY STENT INTERVENTION (PCI-S) N/A 03/11/2012   Procedure: PERCUTANEOUS CORONARY STENT INTERVENTION (PCI-S);  Surgeon: Legrand Como  Burt Knack, MD;  Location: [facility] CATH LAB;  Service: Cardiovascular;  Laterality: N/A;  . SKIN CANCER EXCISION  2013   "nose & right middle finger; actinic keratosis" (03/11/2012)  . TONSILLECTOMY AND ADENOIDECTOMY  ~ 1962  . TUBAL LIGATION  1977  . VIDEO BRONCHOSCOPY WITH ENDOBRONCHIAL ULTRASOUND N/A 07/04/2017   Procedure: VIDEO BRONCHOSCOPY WITH ENDOBRONCHIAL ULTRASOUND;  Surgeon: Collene Gobble, MD;  Location: [facility] OR;  Service: Thoracic;  Laterality: N/A;     OB  History   None      Home Medications    Prior to Admission medications   Medication Sig Start Date End Date Taking? Authorizing Provider  ALPRAZolam Duanne Moron) 0.5 MG tablet Take 0.5 mg by mouth at bedtime.  01/28/18  Yes [provider]  amLODipine (NORVASC) 10 MG tablet Take 1 tablet (10 mg total) by mouth daily. 08/11/17  Yes Kayleen Memos, DO  aspirin 81 MG tablet Take 1 tablet (81 mg total) by mouth daily. Patient taking differently: Take 81 mg by mouth as directed. On mondays and Thursdays 01/21/14  Yes Annita Brod, MD  atorvastatin (LIPITOR) 80 MG tablet Take 80 mg by mouth daily.   Yes [provider]  clopidogrel (PLAVIX) 75 MG tablet Take 75 mg by mouth daily.   Yes [provider]  fluticasone (FLONASE) 50 MCG/ACT nasal spray PLACE 1 SPRAY INTO BOTH NOSTRILS DAILY. 02/02/18  Yes Tanda Rockers, MD  guaiFENesin (MUCINEX) 600 MG 12 hr tablet Take 2 tablets (1,200 mg total) by mouth 2 (two) times daily as needed (Use for one week post discharge, then twice daily as needed). 08/09/17  Yes Donita Brooks, NP  HUMALOG KWIKPEN 100 UNIT/ML KiwkPen 0-8 units three times daily after meals 07/23/17  Yes [provider]  insulin glargine (LANTUS) 100 UNIT/ML injection Inject 10 Units into the skin daily.   Yes [provider]  ipratropium-albuterol (DUONEB) 0.5-2.5 (3) MG/3ML SOLN Take 3 mLs by nebulization every 6 (six) hours as needed (Shortness of breath or wheezing). 08/09/17  Yes Ollis, Brandi L, NP  lactose free nutrition (BOOST PLUS) LIQD Take 237 mLs by mouth 3 (three) times daily between meals. 08/10/17  Yes Irene Pap N, DO  lidocaine-prilocaine (EMLA) cream Apply 1 application topically as needed. Apply topically to port site 1-2 hours prior to chemotherapy or labs. Cover with plastic wrap. 11/24/17  Yes Curt Bears, MD  loratadine (CLARITIN) 10 MG tablet Take 10 mg by mouth daily.   Yes [provider]  metFORMIN  (GLUCOPHAGE) 1000 MG tablet Take 1,000 mg by mouth daily.    Yes [provider]  metoprolol succinate (TOPROL-XL) 50 MG 24 hr tablet Take 50 mg by mouth daily. Take with or immediately following a meal.   Yes [provider]  nitroGLYCERIN (NITROSTAT) 0.4 MG SL tablet Place 1 tablet (0.4 mg total) under the tongue every 5 (five) minutes as needed (up to 3 doses). For chest pain 03/12/12  Yes Dunn, Dayna N, PA-C  ondansetron (ZOFRAN ODT) 4 MG disintegrating tablet Take 1 tablet (4 mg total) by mouth every 8 (eight) hours as needed for nausea or vomiting. 08/28/17  Yes Tanner, Lyndon Code., PA-C  prochlorperazine (COMPAZINE) 10 MG tablet Take 1 tablet (10 mg total) by mouth every 6 (six) hours as needed for nausea or vomiting. 10/23/17  Yes Curt Bears, MD  promethazine (PHENERGAN) 12.5 MG tablet Take 1 tablet (12.5 mg total) by mouth every 4 (four) hours as needed for nausea or  vomiting. 08/27/17  Yes Horton, Barbette Hair, MD  sitaGLIPtin (JANUVIA) 100 MG tablet Take 0.5 tablets (50 mg total) by mouth daily. 08/10/17  Yes Kayleen Memos, DO  sodium chloride (OCEAN) 0.65 % SOLN nasal spray Place 1 spray into both nostrils as needed for congestion (use twice daily for one week post discharge, then as needed). 08/09/17  Yes Donita Brooks, NP    Family History Family History  Problem Relation Age of Onset  . Liver cancer Mother   . Brain cancer Father   . Stomach cancer Father   . Colon cancer Brother 110  . Lung cancer Sister   . Lung cancer Brother   . Stomach cancer Brother   . Diabetes Daughter     Social History Social History   Tobacco Use  . Smoking status: Current Every Day Smoker    Packs/day: 0.50    Years: 51.00    Pack years: 25.50    Types: Cigarettes  . Smokeless tobacco: Never Used  Substance Use Topics  . Alcohol use: No  . Drug use: No     Allergies   Codeine and Morphine and related   Review of Systems Review of Systems  Respiratory: Positive for  cough.   Neurological: Positive for weakness.  All other systems reviewed and are negative.    Physical Exam Updated Vital Signs BP (!) 174/58   Pulse 65   Temp 99.3 F (37.4 C) (Oral)   Resp (!) 30   Ht 5\' 1"  (1.549 m)   Wt 35.4 kg   SpO2 98%   BMI 14.74 kg/m   Physical Exam  Constitutional: She is oriented to person, place, and time. She appears well-developed and well-nourished. No distress.  Thin, cachectic appearing female in no acute distress.  Feels hot to the touch.  HENT:  Head: Normocephalic and atraumatic.  MM dry  Eyes: Pupils are equal, round, and reactive to light. Conjunctivae and EOM are normal.  Neck: Normal range of motion. Neck supple.  Cardiovascular: Normal rate, regular rhythm and intact distal pulses.  Pulmonary/Chest: Effort normal and breath sounds normal. No respiratory distress. She has no wheezes.  Clear lung sounds in all fields  Abdominal: Soft. She exhibits no distension and no mass. There is no tenderness. There is no guarding.  Musculoskeletal: Normal range of motion.  Neurological: She is alert and oriented to person, place, and time.  Skin: Skin is warm and dry. Capillary refill takes less than 2 seconds.  Psychiatric: She has a normal mood and affect.  Nursing note and vitals reviewed.    ED Treatments / Results  Labs (all labs ordered are listed, but only abnormal results are displayed) Labs Reviewed  CBC WITH DIFFERENTIAL/PLATELET - Abnormal; Notable for the following components:      Result Value   WBC 10.9 (*)    RBC 3.33 (*)    Hemoglobin 10.7 (*)    HCT 33.0 (*)    Neutro Abs 7.9 (*)    All other components within normal limits  COMPREHENSIVE METABOLIC PANEL - Abnormal; Notable for the following components:   Sodium 130 (*)    Chloride 95 (*)    Glucose, Bld 273 (*)    Calcium 8.5 (*)    Albumin 3.0 (*)    All other components within normal limits  MAGNESIUM - Abnormal; Notable for the following components:    Magnesium 1.5 (*)    All other components within normal limits  CBG MONITORING, ED -  Abnormal; Notable for the following components:   Glucose-Capillary 218 (*)    All other components within normal limits  CULTURE, BLOOD (ROUTINE X 2)  CULTURE, BLOOD (ROUTINE X 2)  RESPIRATORY PANEL BY PCR  I-STAT CG4 LACTIC ACID, ED    EKG None  Radiology Dg Chest 2 View  Result Date: 02/14/2018 CLINICAL DATA:  Cough, fever EXAM: CHEST - 2 VIEW COMPARISON:  Chest radiograph 10/28/2017. FINDINGS: Right anterior chest wall Port-A-Cath is present with tip projecting over the superior vena cava. Stable cardiac and mediastinal contours. Monitoring leads overlie the patient. Pulmonary hyperinflation. Patchy consolidative opacities within the right mid and lower lung and left lower lung. Re-demonstrated subpleural mass left upper hemithorax. No pleural effusion or pneumothorax. IMPRESSION: Patchy opacities within the bilateral mid and lower lungs may represent infectious process. Re demonstrated peripheral mass left upper lung. Electronically Signed   By: Lovey Newcomer M.D.   On: 02/14/2018 18:24    Procedures Procedures (including critical care time)  Medications Ordered in ED Medications  vancomycin (VANCOCIN) 500 mg in sodium chloride 0.9 % 100 mL IVPB (500 mg Intravenous New Bag/Given 02/14/18 1943)  sodium chloride 0.9 % bolus 1,000 mL (0 mLs Intravenous Stopped 02/14/18 1924)  ceFEPIme (MAXIPIME) 1 g in sodium chloride 0.9 % 100 mL IVPB (0 g Intravenous Stopped 02/14/18 1939)     Initial Impression / Assessment and Plan / ED Course  I have reviewed the triage vital signs and the nursing notes.  Pertinent labs & imaging results that were available during my care of the patient were reviewed by me and considered in my medical decision making (see chart for details).     Presented for evaluation of cough, tiredness, hyperglycemia.  History concerning, patient with a history of lung cancer currently  receiving treatment.  Per family, patient is very far from her baseline today in terms of energy levels.  On exam, patient feels very hot to the touch.  Oral temperature of 99 3, I suspect true temperature is higher.  Heart rate and blood pressure stable.  Will obtain x-ray, basic labs, and reassess. Fluids and abx started.  Case discussed with attending, Dr. Ellender Hose evaluated the pt.   X-ray with patchy infiltrates consistent with pneumonia.  Labs show leukocytosis as compared to 3 days ago.  CMP reassuring, CBG improved from initial reading from 420 to 270. Will hold off on giving insulin at this time. Will call for admission.   Discussed with Dr. Marlowe Sax from [facility], pt to be admitted.   Final Clinical Impressions(s) / ED Diagnoses   Final diagnoses:  HCAP (healthcare-associated pneumonia)  Hyperglycemia    ED Discharge Orders    None       Franchot Heidelberg, PA-C 02/14/18 2004    Duffy Bruce, MD 02/17/18 [phone]

## 2018-02-14 NOTE — ED Triage Notes (Signed)
Her family report pt. To be "weaker than usual" yesterday and today. She is in no distress. She has stage IV lung cancer. She is in no distress.

## 2018-02-14 NOTE — H&P (Addendum)
History and Physical    Katherine Cole LOV:[id number] DOB: 03/27/1941 DOA: 02/14/2018  PCP: Prince Solian, MD Patient coming from: Home  Chief Complaint: Cough, fatigue  HPI: Katherine Cole is a 77 y.o. female with medical history significant of extensive stage small cell lung cancer currently on immunotherapy, coronary artery disease (prior MI, PCI), type 2 diabetes, hypertension, hyperlipidemia, prior CVA presenting to the hospital for evaluation of cough and fatigue.  Patient reports having a one-week history of a nonproductive cough and fatigue.  Daughter at bedside states patient has been feeling dizzy.  Patient denies having any fevers, chills, chest pain, shortness of breath, body aches, nausea, or vomiting.  ED Course: Afebrile.  Tachypneic but not tachycardic.  Not hypotensive.  Satting well on room air.  White count 10.9.  Lactic acid normal.  Chest x-ray showing patchy opacities within the bilateral mid and lower lungs. Patient received vancomycin, cefepime, and 1 L normal saline bolus in the ED.  TRH paged to admit.  Review of Systems: As per HPI otherwise 10 point review of systems negative.  Past Medical History:  Diagnosis Date  . Arthritis    "in my fingers"   . CAD (coronary artery disease)    a. MI 1999 with 2 stents at that time followed by stenting 3-4 yrs later. b. s/p DES to LAD guided by pressure wire 03/11/12.  . Colon polyps    adenomatous  . Depression   . Diabetes mellitus, type 2 ([place])   . Hyperlipemia   . Hypertension   . Osteoarthritis   . Pneumonia 06/2017  . Skin cancer 2012   "nose; right middle finger"  . Stroke [facility])     Past Surgical History:  Procedure Laterality Date  . APPENDECTOMY  ~ 1965  . CATARACT EXTRACTION    . CORONARY ANGIOPLASTY WITH STENT PLACEMENT  1999; 2004; 03/11/2012   "2 + 1 + 1; total of 4" (03/11/2012)  . FEMUR FRACTURE SURGERY  2011   RLE (03/11/2012)  . IR FLUORO GUIDE PORT INSERTION RIGHT  07/18/2017  . IR US GUIDE  VASC ACCESS RIGHT  07/18/2017  . PERCUTANEOUS CORONARY STENT INTERVENTION (PCI-S) N/A 03/11/2012   Procedure: PERCUTANEOUS CORONARY STENT INTERVENTION (PCI-S);  Surgeon: Sherren Mocha, MD;  Location: [facility] C[facility] LAB;  Service: Cardiovascular;  Laterality: N/A;  . SKIN CANCER EXCISION  2013   "nose & right middle finger; actinic keratosis" (03/11/2012)  . TONSILLECTOMY AND ADENOIDECTOMY  ~ 1962  . TUBAL LIGATION  1977  . VIDEO BRONCHOSCOPY WITH ENDOBRONCHIAL ULTRASOUND N/A 07/04/2017   Procedure: VIDEO BRONCHOSCOPY WITH ENDOBRONCHIAL ULTRASOUND;  Surgeon: Collene Gobble, MD;  Location: [facility];  Service: Thoracic;  Laterality: N/A;     reports that she has been smoking cigarettes. She has a 25.50 pack-year smoking history. She has never used smokeless tobacco. She reports that she does not drink alcohol or use drugs.  Allergies  Allergen Reactions  . Codeine Other (See Comments)    "gives me a migraine" (03/11/2012)  . Morphine And Related Other (See Comments)    "gives me a migraine" (03/11/2012)    Family History  Problem Relation Age of Onset  . Liver cancer Mother   . Brain cancer Father   . Stomach cancer Father   . Colon cancer Brother 76  . Lung cancer Sister   . Lung cancer Brother   . Stomach cancer Brother   . Diabetes Daughter     Prior to Admission medications   Medication  Sig Start Date End Date Taking? Authorizing Provider  ALPRAZolam Duanne Moron) 0.5 MG tablet Take 0.5 mg by mouth at bedtime.  01/28/18  Yes [provider]  amLODipine (NORVASC) 10 MG tablet Take 1 tablet (10 mg total) by mouth daily. 08/11/17  Yes Kayleen Memos, DO  aspirin 81 MG tablet Take 1 tablet (81 mg total) by mouth daily. Patient taking differently: Take 81 mg by mouth as directed. On mondays and Thursdays 01/21/14  Yes Annita Brod, MD  atorvastatin (LIPITOR) 80 MG tablet Take 80 mg by mouth daily.   Yes [provider]  clopidogrel (PLAVIX) 75 MG tablet Take 75 mg by mouth  daily.   Yes [provider]  fluticasone (FLONASE) 50 MCG/ACT nasal spray PLACE 1 SPRAY INTO BOTH NOSTRILS DAILY. 02/02/18  Yes Tanda Rockers, MD  guaiFENesin (MUCINEX) 600 MG 12 hr tablet Take 2 tablets (1,200 mg total) by mouth 2 (two) times daily as needed (Use for one week post discharge, then twice daily as needed). 08/09/17  Yes Donita Brooks, NP  HUMALOG KWIKPEN 100 UNIT/ML KiwkPen 0-8 units three times daily after meals 07/23/17  Yes [provider]  insulin glargine (LANTUS) 100 UNIT/ML injection Inject 10 Units into the skin daily.   Yes [provider]  ipratropium-albuterol (DUONEB) 0.5-2.5 (3) MG/3ML SOLN Take 3 mLs by nebulization every 6 (six) hours as needed (Shortness of breath or wheezing). 08/09/17  Yes Ollis, Brandi L, NP  lactose free nutrition (BOOST PLUS) LIQD Take 237 mLs by mouth 3 (three) times daily between meals. 08/10/17  Yes Irene Pap N, DO  lidocaine-prilocaine (EMLA) cream Apply 1 application topically as needed. Apply topically to port site 1-2 hours prior to chemotherapy or labs. Cover with plastic wrap. 11/24/17  Yes Curt Bears, MD  loratadine (CLARITIN) 10 MG tablet Take 10 mg by mouth daily.   Yes [provider]  metFORMIN (GLUCOPHAGE) 1000 MG tablet Take 1,000 mg by mouth daily.    Yes [provider]  metoprolol succinate (TOPROL-XL) 50 MG 24 hr tablet Take 50 mg by mouth daily. Take with or immediately following a meal.   Yes [provider]  nitroGLYCERIN (NITROSTAT) 0.4 MG SL tablet Place 1 tablet (0.4 mg total) under the tongue every 5 (five) minutes as needed (up to 3 doses). For chest pain 03/12/12  Yes Dunn, Dayna N, PA-C  ondansetron (ZOFRAN ODT) 4 MG disintegrating tablet Take 1 tablet (4 mg total) by mouth every 8 (eight) hours as needed for nausea or vomiting. 08/28/17  Yes Tanner, Lyndon Code., PA-C  prochlorperazine (COMPAZINE) 10 MG tablet Take 1 tablet (10 mg total) by mouth every 6 (six) hours as  needed for nausea or vomiting. 10/23/17  Yes Curt Bears, MD  promethazine (PHENERGAN) 12.5 MG tablet Take 1 tablet (12.5 mg total) by mouth every 4 (four) hours as needed for nausea or vomiting. 08/27/17  Yes Horton, Barbette Hair, MD  sitaGLIPtin (JANUVIA) 100 MG tablet Take 0.5 tablets (50 mg total) by mouth daily. 08/10/17  Yes Kayleen Memos, DO  sodium chloride (OCEAN) 0.65 % SOLN nasal spray Place 1 spray into both nostrils as needed for congestion (use twice daily for one week post discharge, then as needed). 08/09/17  Yes Donita Brooks, NP    Physical Exam: Vitals:   02/14/18 1900 02/14/18 1937 02/14/18 2000 02/14/18 2104  BP: (!) 174/58  (!) 165/61 (!) 170/76  Pulse: 65  73 67  Resp: (!) 30  (!)  25 (!) 24  Temp:    99.6 F (37.6 C)  TempSrc:    Oral  SpO2: 98%  99% 96%  Weight:  35.4 kg    Height:  5\' 1"  (1.549 m)      Physical Exam  Constitutional: She is oriented to person, place, and time. No distress.  Frail, cachectic  HENT:  Head: Normocephalic.  Mouth/Throat: Oropharynx is clear and moist.  Eyes: Right eye exhibits no discharge. Left eye exhibits no discharge.  Neck: Neck supple. No tracheal deviation present.  Cardiovascular: Normal rate, regular rhythm and intact distal pulses.  Pulmonary/Chest: Effort normal. No respiratory distress. She has no wheezes. She has no rales.  Abdominal: Soft. Bowel sounds are normal. She exhibits no distension. There is no tenderness. There is no guarding.  Musculoskeletal: She exhibits no edema.  Neurological: She is alert and oriented to person, place, and time.  Skin: Skin is warm and dry. She is not diaphoretic.  Psychiatric: Her behavior is normal.     Labs on Admission: I have personally reviewed following labs and imaging studies  CBC: Recent Labs  Lab 02/11/18 0942 02/14/18 1844  WBC 7.6 10.9*  NEUTROABS 5.0 7.9*  HGB 11.0* 10.7*  HCT 32.3* 33.0*  MCV 94.2 99.1  PLT 237 119   Basic Metabolic Panel: Recent  Labs  Lab 02/11/18 0942 02/14/18 1844  NA 131* 130*  K 4.4 3.6  CL 94* 95*  CO2 29 27  GLUCOSE 313* 273*  BUN 19 19  CREATININE 0.93 0.91  CALCIUM 9.0 8.5*  MG  --  1.5*   GFR: Estimated Creatinine Clearance: 29.9 mL/min (by C-G formula based on SCr of 0.91 mg/dL). Liver Function Tests: Recent Labs  Lab 02/11/18 0942 02/14/18 1844  AST 22 16  ALT 26 20  ALKPHOS 122 92  BILITOT 0.4 0.6  PROT 6.7 6.7  ALBUMIN 3.2* 3.0*   No results for input(s): LIPASE, AMYLASE in the last 168 hours. No results for input(s): AMMONIA in the last 168 hours. Coagulation Profile: No results for input(s): INR, PROTIME in the last 168 hours. Cardiac Enzymes: No results for input(s): CKTOTAL, CKMB, CKMBINDEX, TROPONINI in the last 168 hours. BNP (last 3 results) No results for input(s): PROBNP in the last 8760 hours. HbA1C: No results for input(s): HGBA1C in the last 72 hours. CBG: Recent Labs  Lab 02/14/18 1959  GLUCAP 218*   Lipid Profile: No results for input(s): CHOL, HDL, LDLCALC, TRIG, CHOLHDL, LDLDIRECT in the last 72 hours. Thyroid Function Tests: No results for input(s): TSH, T4TOTAL, FREET4, T3FREE, THYROIDAB in the last 72 hours. Anemia Panel: No results for input(s): VITAMINB12, FOLATE, FERRITIN, TIBC, IRON, RETICCTPCT in the last 72 hours. Urine analysis:    Component Value Date/Time   COLORURINE YELLOW 09/12/2017 2047   APPEARANCEUR HAZY (A) 09/12/2017 2047   LABSPEC 1.024 09/12/2017 2047   PHURINE 5.0 09/12/2017 2047   GLUCOSEU NEGATIVE 09/12/2017 2047   HGBUR NEGATIVE 09/12/2017 2047   BILIRUBINUR NEGATIVE 09/12/2017 2047   KETONESUR 5 (A) 09/12/2017 2047   PROTEINUR 100 (A) 09/12/2017 2047   UROBILINOGEN 0.2 01/20/2014 1322   NITRITE NEGATIVE 09/12/2017 2047   LEUKOCYTESUR TRACE (A) 09/12/2017 2047    Radiological Exams on Admission: Dg Chest 2 View  Result Date: 02/14/2018 CLINICAL DATA:  Cough, fever EXAM: CHEST - 2 VIEW COMPARISON:  Chest radiograph  10/28/2017. FINDINGS: Right anterior chest wall Port-A-Cath is present with tip projecting over the superior vena cava. Stable cardiac and mediastinal contours.  Monitoring leads overlie the patient. Pulmonary hyperinflation. Patchy consolidative opacities within the right mid and lower lung and left lower lung. Re-demonstrated subpleural mass left upper hemithorax. No pleural effusion or pneumothorax. IMPRESSION: Patchy opacities within the bilateral mid and lower lungs may represent infectious process. Re demonstrated peripheral mass left upper lung. Electronically Signed   By: Lovey Newcomer M.D.   On: 02/14/2018 18:24    Assessment/Plan Principal Problem:   Multifocal pneumonia Active Problems:   Hyperlipidemia   CAD (coronary artery disease)   Essential hypertension   Protein calorie malnutrition ([place])   Primary small cell carcinoma of left lung (HCC)   Sepsis (HCC)   COPD II still smoking    Chronic anemia   Hyponatremia   Type 2 diabetes mellitus (HCC)   Hypomagnesemia   Anxiety   Sepsis secondary to multifocal pneumonia -Patient is presenting with a one-week history of nonproductive cough and fatigue. -Afebrile.  Tachypneic but not tachycardic.  Not hypotensive.  Satting well on room air.  White count 10.9.  Lactic acid normal.  Chest x-ray showing patchy opacities within the bilateral mid and lower lungs.  -Continue broad-spectrum antibiotic coverage with vancomycin and cefepime -Influenza less likely as patient denies having any body aches, fevers, chills, nausea, vomiting -IV fluid hydration -Mucinex DM for cough -Repeat CBC in a.m. -Blood culture x2 pending -Respiratory viral panel pending -Supplemental oxygen if needed  Chronic anemia -Stable.  Hemoglobin 10.7, baseline 10-11.  Mild hyponatremia Corrected sodium 133. -Continue IV fluid hydration -Repeat BMP in a.m.  Type 2 diabetes -Most recent CBG 218 -Check A1c -Lantus 10 units daily -Sliding scale insulin  sensitive -CBG checks  Hypomagnesemia Magnesium 1.5.  Potassium 3.6. -Replete magnesium -Repeat magnesium level in a.m.  Extensive stage small cell lung cancer -Followed by Dr. Julien Nordmann from oncology.  Currently on immunotherapy.  Coronary artery disease, prior MI, status post PCI -Stable.  Patient denies having any chest pain. -Continue home aspirin, Plavix, beta-blocker  Hypertension -Blood pressure elevated.  Continue home amlodipine and metoprolol.  Hyperlipidemia -Continue home Lipitor  COPD -Stable.  No bronchospasm.  Continue home inhalers.   Anxiety -Continue home Xanax  Protein calorie malnutrition, cachexia Albumin 3.0. -Continue home nutrition supplement  DVT prophylaxis: Lovenox Code Status: Patient wishes to be DNR. Family Communication: Daughters at bedside. Disposition Plan: Anticipate discharge to home in 1-2 days. Consults called: None Admission status: It is my clinical opinion that admission to INPATIENT is reasonable and necessary in this 77 y.o. female . presenting with symptoms of cough and fatigue, concerning for sepsis secondary to multifocal pneumonia . in the context of PMH including: Stage IV lung cancer . with pertinent positives on physical exam including: Frail, cachectic . and pertinent positives on radiographic and laboratory data including: Leukocytosis, evidence of pneumonia on chest x-ray . Workup and treatment include IV antibiotics, IV fluid.   Given the aforementioned, the predictability of an adverse outcome is felt to be significant. I expect that the patient will require at least 2 midnights in the hospital to treat this condition.   Shela Leff MD [facility] Pager [phone]  If 7PM-7AM, please contact night-coverage [email or web address] Password [facility]  02/14/2018, 9:43 PM

## 2018-02-14 NOTE — Progress Notes (Signed)
A consult was received from an ED physician for Vancomycin per pharmacy dosing.  The patient's profile has been reviewed for ht/wt/allergies/indication/available labs.   A one time order has been placed for Vancomycin 500mg  IV.  Further antibiotics/pharmacy consults should be ordered by admitting physician if indicated.                       Thank you, Biagio Borg 02/14/2018  6:22 PM

## 2018-02-14 NOTE — ED Notes (Signed)
Bed: NP54 Expected date:  Expected time:  Means of arrival:  Comments: weakness

## 2018-02-14 NOTE — ED Notes (Signed)
Report attempted was told by Kennyth Lose to call back in 5 min

## 2018-02-14 NOTE — ED Notes (Signed)
ED TO INPATIENT HANDOFF REPORT  Name/Age/Gender Ruffin Pyo 77 y.o. female  Code Status Code Status History    Date Active Date Inactive Code Status Order ID Comments User Context   08/15/2017 0339 08/16/2017 1630 DNR [id number]  Cristy Folks, MD Inpatient   08/03/2017 1723 08/10/2017 1837 DNR [id number]  Samella Parr, NP Inpatient   08/03/2017 0506 08/03/2017 1723 Full Code [id number]  Norval Morton, MD ED   07/01/2017 0039 07/08/2017 2041 Full Code [id number]  Quintella Baton, MD ED   01/20/2014 1755 01/22/2014 1348 Full Code [id number]  Charlynne Cousins, MD Inpatient    Questions for Most Recent Historical Code Status (Order [id number])    Question Answer Comment   In the event of cardiac or respiratory ARREST Do not call a "code blue"    In the event of cardiac or respiratory ARREST Do not perform Intubation, CPR, defibrillation or ACLS    In the event of cardiac or respiratory ARREST Use medication by any route, position, wound care, and other measures to relive pain and suffering. May use oxygen, suction and manual treatment of airway obstruction as needed for comfort.         Advance Directive Documentation     Most Recent Value  Type of Advance Directive  Healthcare Power of Attorney, Living will  Pre-existing out of facility DNR order (yellow form or pink MOST form)  -  "MOST" Form in Place?  -      Home/SNF/Other Home  Chief Complaint Gen. Weakness/Dizziness/Hyperglycemia  Level of Care/Admitting Diagnosis ED Disposition    ED Disposition Condition Comment   Admit  Hospital Area: [facility] [[id number]]  Level of Care: Telemetry [5]  Admit to tele based on following criteria: Other see comments  Comments: pneumonia  Diagnosis: Multifocal pneumonia [[id number]]  Admitting Physician: Shela Leff [[id number]]  Attending Physician: Shela Leff [[id number]]  Estimated length of stay: past midnight tomorrow  Certification:: I certify this  patient will need inpatient services for at least 2 midnights  PT Class (Do Not Modify): Inpatient [101]  PT Acc Code (Do Not Modify): Private [1]       Medical History Past Medical History:  Diagnosis Date  . Arthritis    "in my fingers"   . CAD (coronary artery disease)    a. MI 1999 with 2 stents at that time followed by stenting 3-4 yrs later. b. s/p DES to LAD guided by pressure wire 03/11/12.  . Colon polyps    adenomatous  . Depression   . Diabetes mellitus, type 2 ([place])   . Hyperlipemia   . Hypertension   . Osteoarthritis   . Pneumonia 06/2017  . Skin cancer 2012   "nose; right middle finger"  . Stroke [facility])     Allergies Allergies  Allergen Reactions  . Codeine Other (See Comments)    "gives me a migraine" (03/11/2012)  . Morphine And Related Other (See Comments)    "gives me a migraine" (03/11/2012)    IV Location/Drains/Wounds Patient Lines/Drains/Airways Status   Active Line/Drains/Airways    Name:   Placement date:   Placement time:   Site:   Days:   Implanted Port 07/22/17   07/22/17    1122    -   207   Peripheral IV 02/14/18 Left Antecubital   02/14/18    1730    Antecubital   less than 1          Labs/Imaging Results for orders placed  or performed during the hospital encounter of 02/14/18 (from the past 48 hour(s))  I-Stat CG4 Lactic Acid, ED     Status: None   Collection Time: 02/14/18  6:26 PM  Result Value Ref Range   Lactic Acid, Venous 1.12 0.5 - 1.9 mmol/L  CBC with Differential     Status: Abnormal   Collection Time: 02/14/18  6:44 PM  Result Value Ref Range   WBC 10.9 (H) 4.0 - 10.5 K/uL   RBC 3.33 (L) 3.87 - 5.11 MIL/uL   Hemoglobin 10.7 (L) 12.0 - 15.0 g/dL   HCT 33.0 (L) 36.0 - 46.0 %   MCV 99.1 80.0 - 100.0 fL   MCH 32.1 26.0 - 34.0 pg   MCHC 32.4 30.0 - 36.0 g/dL   RDW 15.3 11.5 - 15.5 %   Platelets 212 150 - 400 K/uL   nRBC 0.0 0.0 - 0.2 %   Neutrophils Relative % 73 %   Neutro Abs 7.9 (H) 1.7 - 7.7 K/uL   Lymphocytes  Relative 20 %   Lymphs Abs 2.2 0.7 - 4.0 K/uL   Monocytes Relative 6 %   Monocytes Absolute 0.7 0.1 - 1.0 K/uL   Eosinophils Relative 0 %   Eosinophils Absolute 0.0 0.0 - 0.5 K/uL   Basophils Relative 0 %   Basophils Absolute 0.0 0.0 - 0.1 K/uL   Immature Granulocytes 1 %   Abs Immature Granulocytes 0.06 0.00 - 0.07 K/uL    Comment: Performed at [facility], [place] [address] [place], [place] [address]  Comprehensive metabolic panel     Status: Abnormal   Collection Time: 02/14/18  6:44 PM  Result Value Ref Range   Sodium 130 (L) 135 - 145 mmol/L   Potassium 3.6 3.5 - 5.1 mmol/L   Chloride 95 (L) 98 - 111 mmol/L   CO2 27 22 - 32 mmol/L   Glucose, Bld 273 (H) 70 - 99 mg/dL   BUN 19 8 - 23 mg/dL   Creatinine, Ser 0.91 0.44 - 1.00 mg/dL   Calcium 8.5 (L) 8.9 - 10.3 mg/dL   Total Protein 6.7 6.5 - 8.1 g/dL   Albumin 3.0 (L) 3.5 - 5.0 g/dL   AST 16 15 - 41 U/L   ALT 20 0 - 44 U/L   Alkaline Phosphatase 92 38 - 126 U/L   Total Bilirubin 0.6 0.3 - 1.2 mg/dL   GFR calc non Af Amer >60 >60 mL/min   GFR calc Af Amer >60 >60 mL/min    Comment: (NOTE) The eGFR has been calculated using the CKD EPI equation. This calculation has not been validated in all clinical situations. eGFR's persistently <60 mL/min signify possible Chronic Kidney Disease.    Anion gap 8 5 - 15    Comment: Performed at [facility], [place] [address], [place], [place] [address]  Magnesium     Status: Abnormal   Collection Time: 02/14/18  6:44 PM  Result Value Ref Range   Magnesium 1.5 (L) 1.7 - 2.4 mg/dL    Comment: Performed at [facility], [place] [address] [place], [place] [address]  POC CBG, ED     Status: Abnormal   Collection Time: 02/14/18  7:59 PM  Result Value Ref Range   Glucose-Capillary 218 (H) 70 - 99 mg/dL   Dg Chest 2 View  Result Date: 02/14/2018 CLINICAL DATA:  Cough, fever EXAM: CHEST - 2 VIEW COMPARISON:  Chest radiograph 10/28/2017.  FINDINGS: Right anterior chest wall  Port-A-Cath is present with tip projecting over the superior vena cava. Stable cardiac and mediastinal contours. Monitoring leads overlie the patient. Pulmonary hyperinflation. Patchy consolidative opacities within the right mid and lower lung and left lower lung. Re-demonstrated subpleural mass left upper hemithorax. No pleural effusion or pneumothorax. IMPRESSION: Patchy opacities within the bilateral mid and lower lungs may represent infectious process. Re demonstrated peripheral mass left upper lung. Electronically Signed   By: Lovey Newcomer M.D.   On: 02/14/2018 18:24   None  Pending Labs Unresulted Labs (From admission, onward)    Start     Ordered   02/14/18 1938  Respiratory Panel by PCR  (Respiratory virus panel)  Once,   R     02/14/18 1937   02/14/18 1756  Blood culture (routine x 2)  BLOOD CULTURE X 2,   STAT     02/14/18 1757          Vitals/Pain Today's Vitals   02/14/18 1754 02/14/18 1800 02/14/18 1900 02/14/18 1937  BP: (!) 192/67 (!) 186/71 (!) 174/58   Pulse: 65  65   Resp: 16  (!) 30   Temp: 99.3 F (37.4 C)     TempSrc: Oral     SpO2: 100%  98%   Weight:    35.4 kg  Height:    '5\' 1"'$  (1.549 m)  PainSc:        Isolation Precautions No active isolations  Medications Medications  vancomycin (VANCOCIN) 500 mg in sodium chloride 0.9 % 100 mL IVPB (500 mg Intravenous New Bag/Given 02/14/18 1943)  sodium chloride 0.9 % bolus 1,000 mL (0 mLs Intravenous Stopped 02/14/18 1924)  ceFEPIme (MAXIPIME) 1 g in sodium chloride 0.9 % 100 mL IVPB (0 g Intravenous Stopped 02/14/18 1939)    Mobility walks with device

## 2018-02-15 DIAGNOSIS — I10 Essential (primary) hypertension: Secondary | ICD-10-CM

## 2018-02-15 DIAGNOSIS — E43 Unspecified severe protein-calorie malnutrition: Secondary | ICD-10-CM

## 2018-02-15 DIAGNOSIS — A419 Sepsis, unspecified organism: Principal | ICD-10-CM

## 2018-02-15 DIAGNOSIS — E1165 Type 2 diabetes mellitus with hyperglycemia: Secondary | ICD-10-CM

## 2018-02-15 DIAGNOSIS — E871 Hypo-osmolality and hyponatremia: Secondary | ICD-10-CM

## 2018-02-15 DIAGNOSIS — J449 Chronic obstructive pulmonary disease, unspecified: Secondary | ICD-10-CM

## 2018-02-15 DIAGNOSIS — Z794 Long term (current) use of insulin: Secondary | ICD-10-CM

## 2018-02-15 LAB — BASIC METABOLIC PANEL
Anion gap: 9 (ref 5–15)
BUN: 17 mg/dL (ref 8–23)
CHLORIDE: 95 mmol/L — AB (ref 98–111)
CO2: 25 mmol/L (ref 22–32)
Calcium: 8.2 mg/dL — ABNORMAL LOW (ref 8.9–10.3)
Creatinine, Ser: 0.78 mg/dL (ref 0.44–1.00)
GFR calc non Af Amer: >60 mL/min (ref >60)
Glucose, Bld: 456 mg/dL — ABNORMAL HIGH (ref 70–99)
POTASSIUM: 3.9 mmol/L (ref 3.5–5.1)
SODIUM: 129 mmol/L — AB (ref 135–145)

## 2018-02-15 LAB — GLUCOSE, CAPILLARY
Glucose-Capillary: 379 mg/dL — ABNORMAL HIGH (ref 70–99)
Glucose-Capillary: 127 mg/dL — ABNORMAL HIGH (ref 70–99)
Glucose-Capillary: 178 mg/dL — ABNORMAL HIGH (ref 70–99)
Glucose-Capillary: 254 mg/dL — ABNORMAL HIGH (ref 70–99)

## 2018-02-15 LAB — RESPIRATORY PANEL BY PCR
Adenovirus: NOT DETECTED
BORDETELLA PERTUSSIS-RVPCR: NOT DETECTED
CHLAMYDOPHILA PNEUMONIAE-RVPPCR: NOT DETECTED
CORONAVIRUS HKU1-RVPPCR: NOT DETECTED
Coronavirus 229E: NOT DETECTED
Coronavirus NL63: NOT DETECTED
Coronavirus OC43: NOT DETECTED
INFLUENZA A-RVPPCR: NOT DETECTED
Influenza B: NOT DETECTED
Metapneumovirus: NOT DETECTED
Mycoplasma pneumoniae: NOT DETECTED
PARAINFLUENZA VIRUS 4-RVPPCR: NOT DETECTED
Parainfluenza Virus 1: NOT DETECTED
Parainfluenza Virus 2: NOT DETECTED
Parainfluenza Virus 3: NOT DETECTED
RHINOVIRUS / ENTEROVIRUS - RVPPCR: NOT DETECTED
Respiratory Syncytial Virus: NOT DETECTED

## 2018-02-15 LAB — CBC
HEMATOCRIT: 31.6 % — AB (ref 36.0–46.0)
HEMOGLOBIN: 10.4 g/dL — AB (ref 12.0–15.0)
MCH: 32.5 pg (ref 26.0–34.0)
MCHC: 32.9 g/dL (ref 30.0–36.0)
MCV: 98.8 fL (ref 80.0–100.0)
Platelets: 204 10*3/uL (ref 150–400)
RBC: 3.2 MIL/uL — AB (ref 3.87–5.11)
RDW: 15.2 % (ref 11.5–15.5)
WBC: 9.9 10*3/uL (ref 4.0–10.5)
nRBC: 0 % (ref 0.0–0.2)

## 2018-02-15 LAB — HEMOGLOBIN A1C
HEMOGLOBIN A1C: 9.4 % — AB (ref 4.8–5.6)
MEAN PLASMA GLUCOSE: 223.08 mg/dL

## 2018-02-15 LAB — HIV ANTIBODY (ROUTINE TESTING W REFLEX): HIV SCREEN 4TH GENERATION: NONREACTIVE

## 2018-02-15 LAB — MAGNESIUM: Magnesium: 1.9 mg/dL (ref 1.7–2.4)

## 2018-02-15 MED ORDER — SODIUM CHLORIDE 0.9 % IV SOLN
INTRAVENOUS | Status: AC
  Administered 2018-02-15: 11:00:00 via INTRAVENOUS

## 2018-02-15 MED ORDER — INSULIN ASPART 100 UNIT/ML ~~LOC~~ SOLN
0.0000 [IU] | Freq: Three times a day (TID) | SUBCUTANEOUS | Status: DC
  Administered 2018-02-15 – 2018-02-16 (×2): 2 [IU] via SUBCUTANEOUS
  Administered 2018-02-16: 5 [IU] via SUBCUTANEOUS

## 2018-02-15 MED ORDER — INSULIN ASPART 100 UNIT/ML ~~LOC~~ SOLN: 3.0000 [IU] | Freq: Three times a day (TID) | SUBCUTANEOUS | Status: DC

## 2018-02-15 MED ORDER — INSULIN ASPART 100 UNIT/ML ~~LOC~~ SOLN
0.0000 [IU] | Freq: Every day | SUBCUTANEOUS | Status: DC
  Administered 2018-02-15: 3 [IU] via SUBCUTANEOUS
  Administered 2018-02-16: 2 [IU] via SUBCUTANEOUS

## 2018-02-15 MED ORDER — ENSURE ENLIVE PO LIQD: 237.0000 mL | Freq: Two times a day (BID) | ORAL | Status: DC

## 2018-02-15 MED ORDER — NICOTINE 21 MG/24HR TD PT24
21.0000 mg | MEDICATED_PATCH | Freq: Every day | TRANSDERMAL | Status: DC
  Administered 2018-02-15 – 2018-02-18 (×4): 21 mg via TRANSDERMAL
  Filled 2018-02-15 (×4): qty 1

## 2018-02-15 MED ORDER — INFLUENZA VAC SPLIT HIGH-DOSE 0.5 ML IM SUSY
0.5000 mL | PREFILLED_SYRINGE | INTRAMUSCULAR | Status: AC
  Administered 2018-02-16: 0.5 mL via INTRAMUSCULAR
  Filled 2018-02-15: qty 0.5

## 2018-02-15 MED ORDER — VANCOMYCIN HCL 500 MG IV SOLR: 500.0000 mg | INTRAVENOUS | Status: DC

## 2018-02-15 MED ORDER — SODIUM CHLORIDE 0.9 % IV SOLN
1.0000 g | INTRAVENOUS | Status: DC
  Administered 2018-02-15 – 2018-02-18 (×4): 1 g via INTRAVENOUS
  Filled 2018-02-15 (×4): qty 1

## 2018-02-15 MED ORDER — INSULIN GLARGINE 100 UNIT/ML ~~LOC~~ SOLN
12.0000 [IU] | Freq: Every day | SUBCUTANEOUS | Status: DC
  Administered 2018-02-15: 12 [IU] via SUBCUTANEOUS
  Filled 2018-02-15 (×2): qty 0.12

## 2018-02-15 MED ORDER — SODIUM CHLORIDE 0.9 % IV SOLN: 1.0000 g | INTRAVENOUS | Status: DC

## 2018-02-15 MED ORDER — SODIUM CHLORIDE 0.9 % IV SOLN
500.0000 mg | INTRAVENOUS | Status: DC
  Administered 2018-02-15 – 2018-02-16 (×2): 500 mg via INTRAVENOUS
  Filled 2018-02-15 (×2): qty 500

## 2018-02-15 NOTE — Progress Notes (Signed)
Patient ambulatory to front desk with walker and RN assistance, feels very weak, oxygen saturation remains in mid to high 90's on room air even with ambulation.

## 2018-02-15 NOTE — Progress Notes (Signed)
Pharmacy Antibiotic Note  Katherine Cole is a 77 y.o. female admitted on 02/14/2018 with pneumonia.  Pharmacy has been consulted for Vancomycin, cefepime dosing.  Plan: Cefepime 1gm iv q24hr  Vancomycin 500mg  iv x1, then ~24hr later begin 500mg  iv q36hr  Goal AUC = 400 - 500 for all indications, except meningitis (goal AUC > 500 and Cmin 15-20 mcg/mL)   Height: 5\' 1"  (154.9 cm) Weight: 78 lb (35.4 kg) IBW/kg (Calculated) : 47.8  Temp (24hrs), Avg:99.5 F (37.5 C), Min:99.3 F (37.4 C), Max:99.6 F (37.6 C)  Recent Labs  Lab 02/11/18 0942 02/14/18 1826 02/14/18 1844  WBC 7.6  --  10.9*  CREATININE 0.93  --  0.91  LATICACIDVEN  --  1.12  --     Estimated Creatinine Clearance: 29.9 mL/min (by C-G formula based on SCr of 0.91 mg/dL).    Allergies  Allergen Reactions  . Codeine Other (See Comments)    "gives me a migraine" (03/11/2012)  . Morphine And Related Other (See Comments)    "gives me a migraine" (03/11/2012)    Antimicrobials this admission: Vancomycin 02/14/2018 >> Cefepime 02/14/2018 >>   Dose adjustments this admission: -  Microbiology results: -  Thank you for allowing pharmacy to be a part of this patient's care.  Nani Skillern Crowford 02/15/2018 3:13 AM

## 2018-02-15 NOTE — Plan of Care (Signed)
Patient continues to feel very weak, dizzy with ambulation.  Maintains oxygen saturation in the mid to high 90's on room air, even with ambulation.  Up to [facility] with assistance.  Daughters at bedside.

## 2018-02-15 NOTE — Progress Notes (Addendum)
[facility] PROGRESS NOTE    Progress Note  Katherine Cole  KZS:[id number] DOB: August 03, 1940 DOA: 02/14/2018 PCP: Prince Solian, MD     Brief Narrative:   Katherine Cole is an 77 y.o. 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 We will start empirically vancomycin and cefepime.  Will de-escalate to IV Rocephin and azithromycin Culture data is pending. His leukocytosis has resolved she has remained afebrile.  Hyponatremia: In the setting of elevated blood glucose and decreased oral intake. Likely due to elevated her blood glucose and start. Tinea IV fluid hydration for today.  Normocytic anemia: Hemoglobin at baseline, continue to monitor intermittently.  Uncontrolled diabetes mellitus type 2: Glucose is high today in the setting of multifocal pneumonia. Hold metformin, start on sliding scale. A1c is pending.  Hypomagnesemia: Was Repleted orally.  Stage IV small cell lung cancer: [facility] as an [facility].  Coronary artery disease: Denies any chest pain or shortness of breath. Continue aspirin, Plavix and beta-blockers.  Essential hypertension: Seems to be elevated continue current home regimen.  Hyperlipidemia: Continue Lipitor.  COPD: Seems stable.  Anxiety: Continue Xanax.  Severe protein caloric malnutrition: The patient is cachectic continues to nutritional supplements 3 times daily.   DVT prophylaxis: lovenox Family Communication:daughter Disposition Plan/Barrier to D/C: hopefully home in am Code Status:     Code Status Orders  (From admission, onward)         Start     Ordered   02/14/18 2125   Do not attempt resuscitation (DNR)  Continuous    Question Answer Comment  In the event of cardiac or respiratory ARREST Do not call a "code blue"   In the event of cardiac or respiratory ARREST Do not perform Intubation, CPR, defibrillation or ACLS   In the event of cardiac or respiratory ARREST Use medication by any route, position, wound care, and other measures to relive pain and suffering. May use oxygen, suction and manual treatment of airway obstruction as needed for comfort.      02/14/18 2127        Code Status History    Date Active Date Inactive Code Status Order ID Comments User Context   08/15/2017 0339 08/16/2017 1630 DNR [id number]  Cristy Folks, MD Inpatient   08/03/2017 1723 08/10/2017 1837 DNR [id number]  Samella Parr, NP Inpatient   08/03/2017 0506 08/03/2017 1723 Full Code [id number]  Norval Morton, MD ED   07/01/2017 0039 07/08/2017 2041 Full Code [id number]  Quintella Baton, MD ED   01/20/2014 1755 01/22/2014 1348 Full Code [id number]  Charlynne Cousins, MD Inpatient    Advance Directive Documentation     Most Recent Value  Type of Advance Directive  Healthcare Power of [place], Living will  Pre-existing out of facility DNR order (yellow form or pink MOST form)  -  "MOST" Form in Place?  -        IV Access:    Peripheral IV   Procedures and diagnostic studies:   Dg Chest 2 View  Result Date: 02/14/2018 CLINICAL DATA:  Cough, fever EXAM: CHEST - 2 VIEW COMPARISON:  Chest radiograph 10/28/2017. FINDINGS: Right anterior chest wall Port-A-Cath is present with  tip projecting over the superior vena cava. Stable cardiac and mediastinal contours. Monitoring leads overlie the patient. Pulmonary hyperinflation. Patchy consolidative opacities within the right mid and lower lung and left lower lung. Re-demonstrated subpleural mass left upper hemithorax. No pleural effusion or pneumothorax. IMPRESSION: Patchy opacities within the bilateral mid and lower lungs may  represent infectious process. Re demonstrated peripheral mass left upper lung. Electronically Signed   By: Lovey Newcomer M.D.   On: 02/14/2018 18:24     Medical Consultants:    None.  Anti-Infectives:   IV Rocephin and azithromycin.  Subjective:    Katherine Cole she relates she continues to have a persistent cough, she would like her diet changed to regular diet. She relates her breathing is slightly better.  Objective:    Vitals:   02/14/18 1937 02/14/18 2000 02/14/18 2104 02/15/18 0553  BP:  (!) 165/61 (!) 170/76 (!) 184/86  Pulse:  73 67 68  Resp:  (!) 25 (!) 24 (!) 24  Temp:   99.6 F (37.6 C) 98.1 F (36.7 C)  TempSrc:   Oral Oral  SpO2:  99% 96% 99%  Weight: 35.4 kg     Height: 5\' 1"  (1.549 m)       Intake/Output Summary (Last 24 hours) at 02/15/2018 1610 Last data filed at 02/15/2018 0600 Gross per 24 hour  Intake 1713.36 ml  Output 300 ml  Net 1413.36 ml   Filed Weights   02/14/18 1937  Weight: 35.4 kg    Exam: General exam: In no acute distress cachectic Respiratory system: Good air movement and lower lobe crackles Cardiovascular system: S1 & S2 heard, RRR.  Gastrointestinal system: Abdomen is nondistended, soft and nontender.  Central nervous system: Alert and oriented. No focal neurological deficits. Extremities: No pedal edema. Skin: No rashes, lesions or ulcers Psychiatry: Judgement and insight appear normal. Mood & affect appropriate.    Data Reviewed:    Labs: Basic Metabolic Panel: Recent Labs  Lab 02/11/18 0942 02/14/18 1844 02/15/18 0329  NA 131* 130* 129*  K 4.4 3.6 3.9  CL 94* 95* 95*  CO2 29 27 25   GLUCOSE 313* 273* 456*  BUN 19 19 17   CREATININE 9.60 0.91 0.78  CALCIUM 9.0 8.5* 8.2*  MG  --  1.5* 1.9   GFR Estimated Creatinine Clearance: 34 mL/min (by C-G formula based on SCr of 0.78 mg/dL). Liver Function Tests: Recent Labs  Lab 02/11/18 0942 02/14/18 1844  AST 22 16  ALT 26 20  ALKPHOS 122 92  BILITOT 0.4  0.6  PROT 6.7 6.7  ALBUMIN 3.2* 3.0*   No results for input(s): LIPASE, AMYLASE in the last 168 hours. No results for input(s): AMMONIA in the last 168 hours. Coagulation profile No results for input(s): INR, PROTIME in the last 168 hours.  CBC: Recent Labs  Lab 02/11/18 0942 02/14/18 1844 02/15/18 0329  WBC 7.6 10.9* 9.9  NEUTROABS 5.0 7.9*  --   HGB 11.0* 10.7* 10.4*  HCT 32.3* 33.0* 31.6*  MCV 94.2 99.1 98.8  PLT 237 212 204   Cardiac Enzymes: No results for input(s): CKTOTAL, CKMB, CKMBINDEX, TROPONINI in the last 168 hours. BNP (last 3 results) No results for input(s): PROBNP in the last 8760 hours. CBG: Recent Labs  Lab 02/14/18 1959 02/14/18 2320 02/15/18 0802  GLUCAP 218* 271* 379*   D-Dimer: No results for input(s): DDIMER in the last 72 hours. Hgb A1c: No results for input(s): HGBA1C in the last 72 hours. Lipid Profile: No  results for input(s): CHOL, HDL, LDLCALC, TRIG, CHOLHDL, LDLDIRECT in the last 72 hours. Thyroid function studies: No results for input(s): TSH, T4TOTAL, T3FREE, THYROIDAB in the last 72 hours.  Invalid input(s): FREET3 Anemia work up: No results for input(s): VITAMINB12, FOLATE, FERRITIN, TIBC, IRON, RETICCTPCT in the last 72 hours. Sepsis Labs: Recent Labs  Lab 02/11/18 0942 02/14/18 1826 02/14/18 1844 02/15/18 0329  WBC 7.6  --  10.9* 9.9  LATICACIDVEN  --  1.12  --   --    Microbiology No results found for this or any previous visit (from the past 240 hour(s)).   Medications:   . ALPRAZolam  0.5 mg Oral QHS  . amLODipine  10 mg Oral Daily  . [START ON 02/16/2018] aspirin EC  81 mg Oral Once per day on Mon Thu  . atorvastatin  80 mg Oral Daily  . clopidogrel  75 mg Oral Daily  . dextromethorphan-guaiFENesin  1 tablet Oral BID  . enoxaparin (LOVENOX) injection  30 mg Subcutaneous QHS  . fluticasone  1 spray Each Nare Daily  . [START ON 02/16/2018] Influenza vac split quadrivalent PF  0.5 mL Intramuscular  Tomorrow-1000  . insulin aspart  0-9 Units Subcutaneous TID WC  . insulin glargine  10 Units Subcutaneous Daily  . lactose free nutrition  237 mL Oral TID BM  . loratadine  10 mg Oral Daily  . metoprolol succinate  50 mg Oral Daily   Continuous Infusions: . sodium chloride 100 mL/hr at 02/14/18 2245  . ceFEPime (MAXIPIME) IV    . vancomycin       LOS: 1 day   Charlynne Cousins  [facility]   *Please refer to [email or web address], password [email or web address] to get updated schedule on who will round on this patient, as hospitalists switch teams weekly. If 7PM-7AM, please contact night-coverage at [email or web address], password [email or web address] for any overnight needs.  02/15/2018, 8:08 AM

## 2018-02-16 ENCOUNTER — Inpatient Hospital Stay (HOSPITAL_COMMUNITY): Payer: Medicare Other

## 2018-02-16 ENCOUNTER — Encounter (HOSPITAL_COMMUNITY): Payer: Self-pay | Admitting: *Deleted

## 2018-02-16 DIAGNOSIS — C3492 Malignant neoplasm of unspecified part of left bronchus or lung: Secondary | ICD-10-CM

## 2018-02-16 LAB — CBC
HEMATOCRIT: 27.4 % — AB (ref 36.0–46.0)
Hemoglobin: 9.1 g/dL — ABNORMAL LOW (ref 12.0–15.0)
MCH: 32.7 pg (ref 26.0–34.0)
MCHC: 33.2 g/dL (ref 30.0–36.0)
MCV: 98.6 fL (ref 80.0–100.0)
Platelets: 221 10*3/uL (ref 150–400)
RBC: 2.78 MIL/uL — ABNORMAL LOW (ref 3.87–5.11)
RDW: 14.8 % (ref 11.5–15.5)
WBC: 7.1 10*3/uL (ref 4.0–10.5)
nRBC: 0 % (ref 0.0–0.2)

## 2018-02-16 LAB — BASIC METABOLIC PANEL
Anion gap: 6 (ref 5–15)
BUN: 13 mg/dL (ref 8–23)
CO2: 26 mmol/L (ref 22–32)
CREATININE: 0.61 mg/dL (ref 0.44–1.00)
Calcium: 8 mg/dL — ABNORMAL LOW (ref 8.9–10.3)
Chloride: 102 mmol/L (ref 98–111)
GFR calc Af Amer: >60 mL/min (ref >60)
Glucose, Bld: 66 mg/dL — ABNORMAL LOW (ref 70–99)
Potassium: 3.3 mmol/L — ABNORMAL LOW (ref 3.5–5.1)
SODIUM: 134 mmol/L — AB (ref 135–145)

## 2018-02-16 LAB — GLUCOSE, CAPILLARY
Glucose-Capillary: 110 mg/dL — ABNORMAL HIGH (ref 70–99)
Glucose-Capillary: 87 mg/dL (ref 70–99)
Glucose-Capillary: 191 mg/dL — ABNORMAL HIGH (ref 70–99)
Glucose-Capillary: 280 mg/dL — ABNORMAL HIGH (ref 70–99)
Glucose-Capillary: 227 mg/dL — ABNORMAL HIGH (ref 70–99)
Glucose-Capillary: 297 mg/dL — ABNORMAL HIGH (ref 70–99)

## 2018-02-16 MED ORDER — SODIUM CHLORIDE 0.9 % IV SOLN
INTRAVENOUS | Status: AC
  Administered 2018-02-16: 12:00:00 via INTRAVENOUS
  Filled 2018-02-16: qty 1000

## 2018-02-16 MED ORDER — DEXAMETHASONE SODIUM PHOSPHATE 10 MG/ML IJ SOLN: 10.0000 mg | Freq: Once | INTRAMUSCULAR | Status: DC

## 2018-02-16 MED ORDER — DEXAMETHASONE SODIUM PHOSPHATE 4 MG/ML IJ SOLN
4.0000 mg | Freq: Four times a day (QID) | INTRAMUSCULAR | Status: DC
  Administered 2018-02-16 – 2018-02-18 (×7): 4 mg via INTRAVENOUS
  Filled 2018-02-16 (×7): qty 1

## 2018-02-16 MED ORDER — DEXAMETHASONE SODIUM PHOSPHATE 4 MG/ML IJ SOLN: 4.0000 mg | Freq: Four times a day (QID) | INTRAMUSCULAR | Status: DC

## 2018-02-16 MED ORDER — DEXAMETHASONE SODIUM PHOSPHATE 10 MG/ML IJ SOLN
10.0000 mg | Freq: Once | INTRAMUSCULAR | Status: AC
  Administered 2018-02-16: 10 mg via INTRAVENOUS
  Filled 2018-02-16: qty 1

## 2018-02-16 MED ORDER — PANTOPRAZOLE SODIUM 40 MG PO TBEC
40.0000 mg | DELAYED_RELEASE_TABLET | Freq: Every day | ORAL | Status: DC
  Administered 2018-02-16 – 2018-02-18 (×3): 40 mg via ORAL
  Filled 2018-02-16 (×3): qty 1

## 2018-02-16 MED ORDER — AZITHROMYCIN 250 MG PO TABS
500.0000 mg | ORAL_TABLET | Freq: Every day | ORAL | Status: DC
  Administered 2018-02-17 – 2018-02-18 (×2): 500 mg via ORAL
  Filled 2018-02-16 (×2): qty 2

## 2018-02-16 MED ORDER — INSULIN GLARGINE 100 UNIT/ML ~~LOC~~ SOLN
20.0000 [IU] | Freq: Two times a day (BID) | SUBCUTANEOUS | Status: DC
  Administered 2018-02-16: 20 [IU] via SUBCUTANEOUS
  Filled 2018-02-16 (×3): qty 0.2

## 2018-02-16 MED ORDER — IBUPROFEN 200 MG PO TABS
200.0000 mg | ORAL_TABLET | ORAL | Status: DC | PRN
  Administered 2018-02-16 – 2018-02-18 (×2): 200 mg via ORAL
  Filled 2018-02-16 (×2): qty 1

## 2018-02-16 NOTE — Progress Notes (Signed)
[facility] PROGRESS NOTE    Progress Note  Katherine Cole  RKY:[id number] DOB: 12-22-1940 DOA: 02/14/2018 PCP: Prince Solian, MD     Brief Narrative:   Katherine Cole is an 77 y.o. 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 We will start empirically vancomycin and cefepime.   The escalated antibiotic regimen to azithromycin. Would like her going home. With PT.  Hyponatremia: In the setting of elevated blood glucose and decreased oral intake. Likely due to elevated her blood glucose and start. Tinea IV fluid hydration for today.  Normocytic anemia: Hemoglobin at baseline, continue to monitor intermittently.  Uncontrolled diabetes mellitus type 2: With an A1c of 9.4, the patient is not eating enough, she relates she is slightly nauseated.   We will continue sliding scale insulin we will hold Lantus.  Hypomagnesemia: Was Repleted orally.  Stage IV small cell lung cancer: Follow-up with [facility] as an outpatient.  Coronary artery disease: Denies any chest pain or shortness of breath. Continue aspirin, Plavix and beta-blockers.  Essential hypertension: Seems to be elevated continue current home regimen.  Hyperlipidemia: Continue Lipitor.  COPD: Seems stable.  Anxiety: Continue Xanax.  Severe protein caloric malnutrition: The patient is cachectic continues to nutritional supplements 3 times daily.   DVT prophylaxis: lovenox Family Communication:daughter Disposition Plan/Barrier to D/C: hopefully in 1 day Code Status:     Code Status Orders  (From admission, onward)         Start     Ordered   02/14/18 2125  Do not  attempt resuscitation (DNR)  Continuous    Question Answer Comment  In the event of cardiac or respiratory ARREST Do not call a "code blue"   In the event of cardiac or respiratory ARREST Do not perform Intubation, CPR, defibrillation or ACLS   In the event of cardiac or respiratory ARREST Use medication by any route, position, wound care, and other measures to relive pain and suffering. May use oxygen, suction and manual treatment of airway obstruction as needed for comfort.      02/14/18 2127        Code Status History    Date Active Date Inactive Code Status Order ID Comments User Context   08/15/2017 0339 08/16/2017 1630 DNR [id number]  Cristy Folks, MD Inpatient   08/03/2017 1723 08/10/2017 1837 DNR [id number]  Samella Parr, NP Inpatient   08/03/2017 0506 08/03/2017 1723 Full Code [id number]  Norval Morton, MD ED   07/01/2017 0039 07/08/2017 2041 Full Code [id number]  Quintella Baton, MD ED   01/20/2014 1755 01/22/2014 1348 Full Code [id number]  Charlynne Cousins, MD Inpatient    Advance Directive Documentation     Most Recent Value  Type of Advance Directive  Healthcare Power of [place], Living will  Pre-existing out of facility DNR order (yellow form or pink MOST form)  -  "MOST" Form in Place?  -        IV Access:    Peripheral IV   Procedures and diagnostic studies:   Dg Chest 2 View  Result Date: 02/14/2018 CLINICAL DATA:  Cough, fever EXAM: CHEST - 2 VIEW COMPARISON:  Chest radiograph 10/28/2017. FINDINGS: Right anterior chest  wall Port-A-Cath is present with tip projecting over the superior vena cava. Stable cardiac and mediastinal contours. Monitoring leads overlie the patient. Pulmonary hyperinflation. Patchy consolidative opacities within the right mid and lower lung and left lower lung. Re-demonstrated subpleural mass left upper hemithorax. No pleural effusion or pneumothorax. IMPRESSION: Patchy opacities within the bilateral mid and lower lungs may represent  infectious process. Re demonstrated peripheral mass left upper lung. Electronically Signed   By: Lovey Newcomer M.D.   On: 02/14/2018 18:24     Medical Consultants:    None.  Anti-Infectives:   IV Rocephin and azithromycin.  Subjective:    Katherine Cole she relates she continues to have a persistent cough, she would like her diet changed to regular diet. She relates her breathing is slightly better.  Objective:    Vitals:   02/15/18 0553 02/15/18 1518 02/15/18 2054 02/16/18 0431  BP: (!) 184/86 (!) 152/62 (!) 149/68 (!) 164/87  Pulse: 68 72 71 62  Resp: (!) 24 20 18 12   Temp: 98.1 F (36.7 C) 99.3 F (37.4 C) 98.9 F (37.2 C) 98.4 F (36.9 C)  TempSrc: Oral Oral Oral Oral  SpO2: 99% 95% 96% 93%  Weight:      Height:        Intake/Output Summary (Last 24 hours) at 02/16/2018 0920 Last data filed at 02/16/2018 0557 Gross per 24 hour  Intake 2024.16 ml  Output 400 ml  Net 1624.16 ml   Filed Weights   02/14/18 1937  Weight: 35.4 kg    Exam: General exam: In no acute distress cachectic Respiratory system: Good air movement and lower lobe crackles Cardiovascular system: S1 & S2 heard, RRR.  Gastrointestinal system: Abdomen is nondistended, soft and nontender.  Central nervous system: Alert and oriented. No focal neurological deficits. Extremities: No pedal edema. Skin: No rashes, lesions or ulcers Psychiatry: Judgement and insight appear normal. Mood & affect appropriate.    Data Reviewed:    Labs: Basic Metabolic Panel: Recent Labs  Lab 02/11/18 0942 02/14/18 1844 02/15/18 0329 02/16/18 0317  NA 131* 130* 129* 134*  K 4.4 3.6 3.9 3.3*  CL 94* 95* 95* 102  CO2 29 27 25 26   GLUCOSE 313* 273* 456* 66*  BUN 19 19 17 13   CREATININE 0.93 0.91 0.78 0.61  CALCIUM 9.0 8.5* 8.2* 8.0*  MG  --  1.5* 1.9  --    GFR Estimated Creatinine Clearance: 34 mL/min (by C-G formula based on SCr of 0.61 mg/dL). Liver Function Tests: Recent Labs  Lab  02/11/18 0942 02/14/18 1844  AST 22 16  ALT 26 20  ALKPHOS 122 92  BILITOT 0.4 0.6  PROT 6.7 6.7  ALBUMIN 3.2* 3.0*   No results for input(s): LIPASE, AMYLASE in the last 168 hours. No results for input(s): AMMONIA in the last 168 hours. Coagulation profile No results for input(s): INR, PROTIME in the last 168 hours.  CBC: Recent Labs  Lab 02/11/18 0942 02/14/18 1844 02/15/18 0329 02/16/18 0317  WBC 7.6 10.9* 9.9 7.1  NEUTROABS 5.0 7.9*  --   --   HGB 11.0* 10.7* 10.4* 9.1*  HCT 32.3* 33.0* 31.6* 27.4*  MCV 94.2 99.1 98.8 98.6  PLT 237 212 204 221   Cardiac Enzymes: No results for input(s): CKTOTAL, CKMB, CKMBINDEX, TROPONINI in the last 168 hours. BNP (last 3 results) No results for input(s): PROBNP in the last 8760 hours. CBG: Recent Labs  Lab 02/15/18 1140 02/15/18 1331 02/15/18 1721 02/15/18 2115 02/16/18 0809  GLUCAP 110* 127* 178* 254* 87   D-Dimer: No results for input(s): DDIMER in the last 72 hours. Hgb A1c: Recent Labs    02/15/18 0329  HGBA1C 9.4*   Lipid Profile: No results for input(s): CHOL, HDL, LDLCALC, TRIG, CHOLHDL, LDLDIRECT in the last 72 hours. Thyroid function studies: No results for input(s): TSH, T4TOTAL, T3FREE, THYROIDAB in the last 72 hours.  Invalid input(s): FREET3 Anemia work up: No results for input(s): VITAMINB12, FOLATE, FERRITIN, TIBC, IRON, RETICCTPCT in the last 72 hours. Sepsis Labs: Recent Labs  Lab 02/11/18 0942 02/14/18 1826 02/14/18 1844 02/15/18 0329 02/16/18 0317  WBC 7.6  --  10.9* 9.9 7.1  LATICACIDVEN  --  1.12  --   --   --    Microbiology Recent Results (from the past 240 hour(s))  Blood culture (routine x 2)     Status: None (Preliminary result)   Collection Time: 02/14/18  6:30 PM  Result Value Ref Range Status   Specimen Description   Final    BLOOD BLOOD LEFT FOREARM Performed at [facility] [address] [place], [place] [address]    Special Requests   Final     BOTTLES DRAWN AEROBIC AND ANAEROBIC Blood Culture adequate volume Performed at [facility] [address], [place], [place] [address]    Culture   Final    NO GROWTH < 12 HOURS Performed at [facility] [address], [place], [place] [address]    Report Status PENDING  Incomplete  Blood culture (routine x 2)     Status: None (Preliminary result)   Collection Time: 02/14/18  6:40 PM  Result Value Ref Range Status   Specimen Description   Final    BLOOD RIGHT ANTECUBITAL Performed at [facility] [address], [place], [place] [address]    Special Requests   Final    BOTTLES DRAWN AEROBIC AND ANAEROBIC Blood Culture adequate volume Performed at [facility] [address], [place], [place] [address]    Culture   Final    NO GROWTH < 12 HOURS Performed at [facility] [address] [place], [place] [address]    Report Status PENDING  Incomplete  Respiratory Panel by PCR     Status: None   Collection Time: 02/15/18  3:48 PM  Result Value Ref Range Status   Adenovirus NOT DETECTED NOT DETECTED Final   Coronavirus 229E NOT DETECTED NOT DETECTED Final   Coronavirus HKU1 NOT DETECTED NOT DETECTED Final   Coronavirus NL63 NOT DETECTED NOT DETECTED Final   Coronavirus OC43 NOT DETECTED NOT DETECTED Final   Metapneumovirus NOT DETECTED NOT DETECTED Final   Rhinovirus / Enterovirus NOT DETECTED NOT DETECTED Final   Influenza A NOT DETECTED NOT DETECTED Final   Influenza B NOT DETECTED NOT DETECTED Final   Parainfluenza Virus 1 NOT DETECTED NOT DETECTED Final   Parainfluenza Virus 2 NOT DETECTED NOT DETECTED Final   Parainfluenza Virus 3 NOT DETECTED NOT DETECTED Final   Parainfluenza Virus 4 NOT DETECTED NOT DETECTED Final   Respiratory Syncytial Virus NOT DETECTED NOT DETECTED Final   Bordetella pertussis NOT DETECTED NOT DETECTED Final   Chlamydophila pneumoniae NOT DETECTED NOT DETECTED Final    Mycoplasma pneumoniae NOT DETECTED NOT DETECTED Final     Medications:   . ALPRAZolam  0.5 mg Oral QHS  . amLODipine  10 mg Oral Daily  . aspirin EC  81 mg Oral Once  per day on Mon Thu  . atorvastatin  80 mg Oral Daily  . clopidogrel  75 mg Oral Daily  . dextromethorphan-guaiFENesin  1 tablet Oral BID  . enoxaparin (LOVENOX) injection  30 mg Subcutaneous QHS  . feeding supplement (ENSURE ENLIVE)  237 mL Oral BID BM  . fluticasone  1 spray Each Nare Daily  . Influenza vac split quadrivalent PF  0.5 mL Intramuscular Tomorrow-1000  . insulin aspart  0-5 Units Subcutaneous QHS  . insulin aspart  0-9 Units Subcutaneous TID WC  . insulin aspart  3 Units Subcutaneous TID WC  . insulin glargine  12 Units Subcutaneous Daily  . lactose free nutrition  237 mL Oral TID BM  . loratadine  10 mg Oral Daily  . metoprolol succinate  50 mg Oral Daily  . nicotine  21 mg Transdermal Daily  . pantoprazole  40 mg Oral Daily   Continuous Infusions: . azithromycin 500 mg (02/16/18 0918)  . cefTRIAXone (ROCEPHIN)  IV 1 g (02/16/18 0819)     LOS: 2 days   Charlynne Cousins  [facility]   *Please refer to [email or web address], password [email or web address] to get updated schedule on who will round on this patient, as hospitalists switch teams weekly. If 7PM-7AM, please contact night-coverage at [email or web address], password [email or web address] for any overnight needs.  02/16/2018, 9:20 AM

## 2018-02-16 NOTE — Care Management Note (Signed)
Case Management Note  Patient Details  Name: Katherine Cole MRN: [id number] Date of Birth: October 01, 1940  Subjective/Objective: PNA. From home, has children support. PT-recc HHPT.[facility] chosen by dtr rep Santiago Glad aware. No further CM needs.                 Action/Plan:dc home w/HHC.   Expected Discharge Date:                  Expected Discharge Plan:  [place]  In-House Referral:     Discharge planning Services  CM Consult  Post Acute Care Choice:    Choice offered to:  Adult Children  DME Arranged:    DME Agency:     HH Arranged:  PT HH Agency:  [facility]  Status of Service:  In process, will continue to follow  If discussed at Long Length of Stay Meetings, dates discussed:    Additional Comments:  Dessa Phi, RN 02/16/2018, 3:41 PM

## 2018-02-16 NOTE — Evaluation (Signed)
Physical Therapy Evaluation Patient Details Name: Katherine Cole MRN: [id number] DOB: 12-20-1940 Today's Date: 02/16/2018   History of Present Illness  Katherine Cole is an 77 y.o. 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 All education has been completed and the patient has no further questions. Pt doing well although continues to fatigue easily, feels more comfortable with RW  currently, has RW at home; encouraged pt to amb as tolerated with staff or family while in acute setting; given pt lives alone, frequent admissions and decr activity tol recommend HHPT at d/c; See below for any follow-up Physical Therapy or equipment needs. PT is signing off. Thank you for this referral.     Follow Up Recommendations Home health PT    Equipment Recommendations  None recommended by PT    Recommendations for Other Services       Precautions / Restrictions Restrictions Weight Bearing Restrictions: No      Mobility  Bed Mobility Overal bed mobility: Needs Assistance Bed Mobility: Supine to Sit;Sit to Supine     Supine to sit: Modified independent (Device/Increase time) Sit to supine: Modified independent (Device/Increase time)   General bed mobility comments: incr time, no physical assist  Transfers Overall transfer level: Needs assistance Equipment used: Rolling walker (2 wheeled) Transfers: Sit to/from Stand Sit to Stand: Supervision         General transfer comment: for safety (transferred from varying ht surfaces)  Ambulation/Gait Ambulation/Gait assistance: Supervision;Min guard Gait Distance (Feet): 220 Feet(10' without device, min/guard assist) Assistive device: Rolling  walker (2 wheeled) Gait Pattern/deviations: Step-through pattern;Drifts right/left     General Gait Details: mild unsteadiness initially however no overt LOB; pt feels more secure with RW, stability improved with distance  Stairs            Wheelchair Mobility    Modified Rankin (Stroke Patients Only)       Balance Overall balance assessment: Needs assistance Sitting-balance support: No upper extremity supported;Feet supported Sitting balance-Leahy Scale: Good       Standing balance-Leahy Scale: Fair Standing balance comment: able to minimally wt shift out of BOS without UE support, unable to tol moderate challenges                             Pertinent Vitals/Pain Pain Assessment: No/denies pain    Home Living Family/patient expects to be discharged to:: Private residence Living Arrangements: Alone Available Help at Discharge: Family Type of Home: House Home Access: Stairs to enter Entrance Stairs-Rails: None Entrance Stairs-Number of Steps: 1 Home Layout: One level Home Equipment: Environmental consultant - 2 wheels;Shower seat Additional Comments: supportive family assist prn, one dtr in [place] visits frequently, one dtr ~30 mins away    Prior Function                 Hand Dominance        Extremity/Trunk Assessment   Upper Extremity Assessment Upper Extremity Assessment: (at least 3+5, AROM WFL)    Lower Extremity Assessment Lower Extremity Assessment: (at least 3+5, AROM WFL)       Communication      Cognition Arousal/Alertness: Awake/alert Behavior During Therapy: WFL for tasks assessed/performed Overall Cognitive Status: Within  Functional Limits for tasks assessed                                        General Comments      Exercises     Assessment/Plan    PT Assessment    PT Problem List         PT Treatment Interventions      PT Goals (Current goals can be found in the Care Plan section)  Acute Rehab PT  Goals Patient Stated Goal: get stronger PT Goal Formulation: All assessment and education complete, DC therapy    Frequency     Barriers to discharge        Co-evaluation               AM-PAC PT "6 Clicks" Daily Activity  Outcome Measure Difficulty turning over in bed (including adjusting bedclothes, sheets and blankets)?: None Difficulty moving from lying on back to sitting on the side of the bed? : None Difficulty sitting down on and standing up from a chair with arms (e.g., wheelchair, bedside commode, etc,.)?: None Help needed moving to and from a bed to chair (including a wheelchair)?: None Help needed walking in hospital room?: A Little Help needed climbing 3-5 steps with a railing? : A Little 6 Click Score: 22    End of Session   Activity Tolerance: Patient tolerated treatment well;Patient limited by fatigue Patient left: in bed;with family/visitor present;with call bell/phone within reach   PT Visit Diagnosis: Unsteadiness on feet (R26.81)    Time: 3428-7681 PT Time Calculation (min) (ACUTE ONLY): 20 min   Charges:   PT Evaluation $PT Eval Low Complexity: 1 Low          Kenyon Ana, PT  Pager: [phone] Acute Rehab Dept [facility]): [phone]   02/16/2018   [facility] 02/16/2018, 12:19 PM

## 2018-02-17 ENCOUNTER — Inpatient Hospital Stay (HOSPITAL_COMMUNITY): Payer: Medicare Other

## 2018-02-17 DIAGNOSIS — E43 Unspecified severe protein-calorie malnutrition: Secondary | ICD-10-CM

## 2018-02-17 DIAGNOSIS — G936 Cerebral edema: Secondary | ICD-10-CM

## 2018-02-17 LAB — BASIC METABOLIC PANEL
ANION GAP: 7 (ref 5–15)
BUN: 12 mg/dL (ref 8–23)
CALCIUM: 8.1 mg/dL — AB (ref 8.9–10.3)
CO2: 25 mmol/L (ref 22–32)
Chloride: 99 mmol/L (ref 98–111)
Creatinine, Ser: 0.61 mg/dL (ref 0.44–1.00)
GFR calc non Af Amer: >60 mL/min (ref >60)
GLUCOSE: 303 mg/dL — AB (ref 70–99)
POTASSIUM: 4.4 mmol/L (ref 3.5–5.1)
Sodium: 131 mmol/L — ABNORMAL LOW (ref 135–145)

## 2018-02-17 LAB — GLUCOSE, CAPILLARY
GLUCOSE-CAPILLARY: 454 mg/dL — AB (ref 70–99)
GLUCOSE-CAPILLARY: 361 mg/dL — AB (ref 70–99)
GLUCOSE-CAPILLARY: 341 mg/dL — AB (ref 70–99)
Glucose-Capillary: 246 mg/dL — ABNORMAL HIGH (ref 70–99)
Glucose-Capillary: 297 mg/dL — ABNORMAL HIGH (ref 70–99)
Glucose-Capillary: 403 mg/dL — ABNORMAL HIGH (ref 70–99)
Glucose-Capillary: 360 mg/dL — ABNORMAL HIGH (ref 70–99)

## 2018-02-17 MED ORDER — INSULIN GLARGINE 100 UNIT/ML ~~LOC~~ SOLN
20.0000 [IU] | Freq: Once | SUBCUTANEOUS | Status: AC
  Administered 2018-02-17: 20 [IU] via SUBCUTANEOUS
  Filled 2018-02-17: qty 0.2

## 2018-02-17 MED ORDER — INSULIN ASPART 100 UNIT/ML ~~LOC~~ SOLN
0.0000 [IU] | Freq: Three times a day (TID) | SUBCUTANEOUS | Status: DC
  Administered 2018-02-17: 11 [IU] via SUBCUTANEOUS
  Administered 2018-02-17: 7 [IU] via SUBCUTANEOUS
  Administered 2018-02-17: 20 [IU] via SUBCUTANEOUS

## 2018-02-17 MED ORDER — INSULIN ASPART 100 UNIT/ML ~~LOC~~ SOLN
14.0000 [IU] | Freq: Once | SUBCUTANEOUS | Status: AC
  Administered 2018-02-17: 14 [IU] via SUBCUTANEOUS

## 2018-02-17 MED ORDER — INSULIN GLARGINE 100 UNIT/ML ~~LOC~~ SOLN
40.0000 [IU] | Freq: Two times a day (BID) | SUBCUTANEOUS | Status: DC
  Administered 2018-02-17: 40 [IU] via SUBCUTANEOUS
  Filled 2018-02-17: qty 0.4

## 2018-02-17 MED ORDER — INSULIN GLARGINE 100 UNIT/ML ~~LOC~~ SOLN: 40.0000 [IU] | Freq: Two times a day (BID) | SUBCUTANEOUS | Status: DC

## 2018-02-17 MED ORDER — INSULIN ASPART 100 UNIT/ML ~~LOC~~ SOLN
0.0000 [IU] | Freq: Every day | SUBCUTANEOUS | Status: DC
  Administered 2018-02-17: 5 [IU] via SUBCUTANEOUS

## 2018-02-17 MED ORDER — GADOBUTROL 1 MMOL/ML IV SOLN
3.5000 mL | Freq: Once | INTRAVENOUS | Status: AC | PRN
  Administered 2018-02-17: 3.5 mL via INTRAVENOUS

## 2018-02-17 MED ORDER — INSULIN ASPART 100 UNIT/ML ~~LOC~~ SOLN
6.0000 [IU] | Freq: Three times a day (TID) | SUBCUTANEOUS | Status: DC
  Administered 2018-02-17 (×3): 6 [IU] via SUBCUTANEOUS

## 2018-02-17 MED ORDER — INSULIN GLARGINE 100 UNIT/ML ~~LOC~~ SOLN
30.0000 [IU] | Freq: Two times a day (BID) | SUBCUTANEOUS | Status: DC
  Filled 2018-02-17: qty 0.3

## 2018-02-17 MED ORDER — ADULT MULTIVITAMIN W/MINERALS CH
1.0000 | ORAL_TABLET | Freq: Every day | ORAL | Status: DC
  Administered 2018-02-17 – 2018-02-18 (×2): 1 via ORAL
  Filled 2018-02-17 (×2): qty 1

## 2018-02-17 MED ORDER — INSULIN GLARGINE 100 UNIT/ML ~~LOC~~ SOLN
55.0000 [IU] | Freq: Two times a day (BID) | SUBCUTANEOUS | Status: DC
  Administered 2018-02-17 – 2018-02-18 (×2): 55 [IU] via SUBCUTANEOUS
  Filled 2018-02-17 (×3): qty 0.55

## 2018-02-17 NOTE — Consult Note (Signed)
   [facility] CM Inpatient Consult   02/17/2018  JINNIE ONLEY January 18, 1941 [id number]      Patient screened for potential [facility] Care Management services due to unplanned readmission risk score of 34% (extreme).  Went to bedside to speak with patient and family about [facility] program services. [facility] Care Management was pleasantly declined. Patient's daughter states " home health is usually enough and she doesn't usually need them long." Accepted [facility] Care Management brochure with contact information to call should they change their mind.  Made inpatient RNCM aware that [place] Management services were declined.    Marthenia Rolling, MSN-Ed, RN,BSN [facility] Liaison [phone]

## 2018-02-17 NOTE — Progress Notes (Signed)
Lamar Blinks, NP notified pt's blood sugar has come down to 361 after treatment with 5 units novolog per SSI. Awaiting call back/orders for further interventions. Will continue to monitor closely.

## 2018-02-17 NOTE — Progress Notes (Addendum)
CRITICAL VALUE ALERT  Critical Value:  CBG: 607  Date & Time Notied:  02/17/18 1642  Provider Notified: text paged MD  Orders Received/Actions taken:  MD paged for blood glucose of 454. Per sliding scale and standard coverage, would need to give 26 units. lantus had been previously adjusted. Awaiting response. ------ MD called and ordered to give a total of 40units Novolog, including sliding scale and standard coverage. No venous draw needed.

## 2018-02-17 NOTE — Progress Notes (Signed)
[facility] PROGRESS NOTE    Progress Note  Katherine Cole  UEA:[id number] DOB: [birth year] DOA: 02/14/2018 PCP: Prince Solian, MD     Brief Narrative:   Katherine Cole is an 77 y.o. 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 We will start empirically vancomycin and cefepime.   The escalated antibiotic regimen to azithromycin. Would like her going home with PT.  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 She was started on IV Decadron. Not a candidate for radiation therapy.  Hyponatremia: In the setting of elevated blood glucose and decreased oral intake. Likely due to elevated her blood glucose. Improve as her blood sugar improved to the basic metabolic panel in the morning.  Normocytic anemia: Hemoglobin at baseline, continue to monitor intermittently.  Uncontrolled diabetes mellitus type 2: With an A1c of 9.4, the patient is not eating enough, she relates she is slightly nauseated.   Her Lantus had to be restarted she was started on IV Decadron and she would not was put on sliding scale insulin resistant scale.  As we transition her to oral Decadron and decrease the dose she will probably be better off on a lower dose of insulin especially as an outpatient as she has decrease oral intake.  Hypomagnesemia: Was Repleted orally.  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  See metastatic lung cancer to the brain with vasogenic  edema  Coronary artery disease: Denies any chest pain or shortness of breath. Continue aspirin, Plavix and beta-blockers.  Essential hypertension: Seems to be elevated continue current home regimen.  Hyperlipidemia: Continue Lipitor.  COPD: Seems stable.  Anxiety: Continue Xanax.  Severe protein caloric malnutrition: The patient is cachectic continues to nutritional supplements 3 times daily.   DVT prophylaxis: lovenox Family Communication:daughter Disposition Plan/Barrier to D/C: UNable to determine Code Status:     Code Status Orders  (From admission, onward)         Start     Ordered   02/14/18 2125  Do not attempt resuscitation (DNR)  Continuous    Question Answer Comment  In the event of cardiac or respiratory ARREST Do not call a "code blue"   In the event of cardiac or respiratory ARREST Do not perform Intubation, CPR, defibrillation or ACLS   In the event of cardiac or respiratory ARREST Use medication by any route, position, wound care, and other measures to relive pain and suffering. May use oxygen, suction and manual treatment of airway obstruction as needed for comfort.      02/14/18 2127        Code Status History    Date Active Date Inactive Code Status Order ID Comments User Context   08/15/2017 0339 08/16/2017 1630 DNR [id number]  Cristy Folks, MD Inpatient   08/03/2017 1723 08/10/2017 1837 DNR [id number]  Samella Parr, NP Inpatient   08/03/2017 0506 08/03/2017 1723 Full Code [id number]  Norval Morton, MD ED   07/01/2017 0039 07/08/2017 2041 Full Code [id number]  Crosley, Debby,  MD ED   01/20/2014 1755 01/22/2014 1348 Full Code [id number]  Charlynne Cousins, MD Inpatient    Advance Directive Documentation     Most Recent Value  Type of Advance Directive  Healthcare Power of [place], Living will  Pre-existing out of facility DNR order (yellow form or pink MOST form)  -  "MOST" Form in Place?  -        IV Access:    Peripheral  IV   Procedures and diagnostic studies:   Ct Head Wo Contrast  Result Date: 02/16/2018 CLINICAL DATA:  Small cell lung cancer. EXAM: CT HEAD WITHOUT CONTRAST TECHNIQUE: Contiguous axial images were obtained from the base of the skull through the vertex without intravenous contrast. COMPARISON:  Head CT 05/29/2015 FINDINGS: Brain: Vasogenic edema within the superior left frontal lobe, concerning for metastasis. No other edema. No midline shift, hemorrhage or herniation. No hydrocephalus. Mild generalized atrophy. Areas of hypoattenuation of the deep gray nuclei and confluent periventricular white matter hypodensity, consistent with chronic small vessel disease. Vascular: Atherosclerotic calcification of the internal carotid arteries at the skull base. No abnormal hyperdensity of the major intracranial arteries or dural venous sinuses. Skull: The visualized skull base, calvarium and extracranial soft tissues are normal. Sinuses/Orbits: No fluid levels or advanced mucosal thickening of the visualized paranasal sinuses. No mastoid or middle ear effusion. The orbits are normal. IMPRESSION: 1. Vasogenic edema pattern within the superior left frontal lobe is concerning for metastatic disease. MRI of the brain with and without contrast is recommended if there are no contraindications. Otherwise, head CT with contrast might be helpful for better characterization of metastatic lesions. 2. No hemorrhage, herniation or hydrocephalus. Electronically Signed   By: Ulyses Jarred M.D.   On: 02/16/2018 15:34     Medical Consultants:    None.  Anti-Infectives:   IV Rocephin and azithromycin.  Subjective:    Katherine Cole less confused today tolerating her diet.  Objective:    Vitals:   02/16/18 2041 02/16/18 2242 02/17/18 0419 02/17/18 0854  BP: (!) 165/79 (!) 160/83 (!) 151/69 (!) 139/57  Pulse: 76 68 (!) 57 65  Resp: 16 16 16    Temp: (!) 100.4 F (38 C) 98.8 F (37.1 C) (!) 97.5 F (36.4 C)    TempSrc: Oral Oral Oral   SpO2: 96%  96% 99%  Weight:      Height:        Intake/Output Summary (Last 24 hours) at 02/17/2018 1010 Last data filed at 02/17/2018 0000 Gross per 24 hour  Intake 967 ml  Output -  Net 967 ml   Filed Weights   02/14/18 1937  Weight: 35.4 kg    Exam: General exam: In no acute distress cachectic Respiratory system: Good air movement and lower lobe crackles Cardiovascular system: S1 & S2 heard, RRR.  Gastrointestinal system: Abdomen is nondistended, soft and nontender.  Central nervous system: Alert and oriented. No focal neurological deficits. Extremities: No pedal edema. Skin: No rashes, lesions or ulcers Psychiatry: Judgment and insight appeared impaired   Data Reviewed:    Labs: Basic Metabolic Panel: Recent Labs  Lab 02/11/18 0942 02/14/18 1844 02/15/18 0329 02/16/18 0317 02/17/18 0459  NA 131* 130* 129* 134* 131*  K 4.4 3.6 3.9 3.3* 4.4  CL 94* 95* 95* 102 99  CO2 29 27 25 26 25   GLUCOSE 313* 273* 456* 66* 303*  BUN 19 19 17 13 12   CREATININE 0.93 0.91 0.78 0.61 0.61  CALCIUM 9.0 8.5* 8.2*  8.0* 8.1*  MG  --  1.5* 1.9  --   --    GFR Estimated Creatinine Clearance: 34 mL/min (by C-G formula based on SCr of 0.61 mg/dL). Liver Function Tests: Recent Labs  Lab 02/11/18 0942 02/14/18 1844  AST 22 16  ALT 26 20  ALKPHOS 122 92  BILITOT 0.4 0.6  PROT 6.7 6.7  ALBUMIN 3.2* 3.0*   No results for input(s): LIPASE, AMYLASE in the last 168 hours. No results for input(s): AMMONIA in the last 168 hours. Coagulation profile No results for input(s): INR, PROTIME in the last 168 hours.  CBC: Recent Labs  Lab 02/11/18 0942 02/14/18 1844 02/15/18 0329 02/16/18 0317  WBC 7.6 10.9* 9.9 7.1  NEUTROABS 5.0 7.9*  --   --   HGB 11.0* 10.7* 10.4* 9.1*  HCT 32.3* 33.0* 31.6* 27.4*  MCV 94.2 99.1 98.8 98.6  PLT 237 212 204 221   Cardiac Enzymes: No results for input(s): CKTOTAL, CKMB, CKMBINDEX, TROPONINI in the last 168  hours. BNP (last 3 results) No results for input(s): PROBNP in the last 8760 hours. CBG: Recent Labs  Lab 02/16/18 1140 02/16/18 1644 02/16/18 2039 02/16/18 2239 02/17/18 0724  GLUCAP 191* 280* 227* 297* 246*   D-Dimer: No results for input(s): DDIMER in the last 72 hours. Hgb A1c: Recent Labs    02/15/18 0329  HGBA1C 9.4*   Lipid Profile: No results for input(s): CHOL, HDL, LDLCALC, TRIG, CHOLHDL, LDLDIRECT in the last 72 hours. Thyroid function studies: No results for input(s): TSH, T4TOTAL, T3FREE, THYROIDAB in the last 72 hours.  Invalid input(s): FREET3 Anemia work up: No results for input(s): VITAMINB12, FOLATE, FERRITIN, TIBC, IRON, RETICCTPCT in the last 72 hours. Sepsis Labs: Recent Labs  Lab 02/11/18 0942 02/14/18 1826 02/14/18 1844 02/15/18 0329 02/16/18 0317  WBC 7.6  --  10.9* 9.9 7.1  LATICACIDVEN  --  1.12  --   --   --    Microbiology Recent Results (from the past 240 hour(s))  Blood culture (routine x 2)     Status: None (Preliminary result)   Collection Time: 02/14/18  6:30 PM  Result Value Ref Range Status   Specimen Description   Final    BLOOD BLOOD LEFT FOREARM Performed at [facility] [address], [place], [place] [address]    Special Requests   Final    BOTTLES DRAWN AEROBIC AND ANAEROBIC Blood Culture adequate volume Performed at [facility] [address], [place], [place] [address]    Culture   Final    NO GROWTH 2 DAYS Performed at [facility] [address] [place], [place] [address]    Report Status PENDING  Incomplete  Blood culture (routine x 2)     Status: None (Preliminary result)   Collection Time: 02/14/18  6:40 PM  Result Value Ref Range Status   Specimen Description   Final    BLOOD RIGHT ANTECUBITAL Performed at [facility] [address] [place], [place] [address]    Special Requests   Final    BOTTLES DRAWN AEROBIC AND ANAEROBIC Blood  Culture adequate volume Performed at [facility] [address], [place], [place] [address]    Culture   Final    NO GROWTH 2 DAYS Performed at [facility] [address] [place], [place] [address]    Report Status PENDING  Incomplete  Respiratory Panel by PCR     Status: None  Collection Time: 02/15/18  3:48 PM  Result Value Ref Range Status   Adenovirus NOT DETECTED NOT DETECTED Final   Coronavirus 229E NOT DETECTED NOT DETECTED Final   Coronavirus HKU1 NOT DETECTED NOT DETECTED Final   Coronavirus NL63 NOT DETECTED NOT DETECTED Final   Coronavirus OC43 NOT DETECTED NOT DETECTED Final   Metapneumovirus NOT DETECTED NOT DETECTED Final   Rhinovirus / Enterovirus NOT DETECTED NOT DETECTED Final   Influenza A NOT DETECTED NOT DETECTED Final   Influenza B NOT DETECTED NOT DETECTED Final   Parainfluenza Virus 1 NOT DETECTED NOT DETECTED Final   Parainfluenza Virus 2 NOT DETECTED NOT DETECTED Final   Parainfluenza Virus 3 NOT DETECTED NOT DETECTED Final   Parainfluenza Virus 4 NOT DETECTED NOT DETECTED Final   Respiratory Syncytial Virus NOT DETECTED NOT DETECTED Final   Bordetella pertussis NOT DETECTED NOT DETECTED Final   Chlamydophila pneumoniae NOT DETECTED NOT DETECTED Final   Mycoplasma pneumoniae NOT DETECTED NOT DETECTED Final     Medications:   . ALPRAZolam  0.5 mg Oral QHS  . amLODipine  10 mg Oral Daily  . aspirin EC  81 mg Oral Once per day on Mon Thu  . atorvastatin  80 mg Oral Daily  . azithromycin  500 mg Oral Daily  . clopidogrel  75 mg Oral Daily  . dexamethasone  4 mg Intravenous Q6H  . dextromethorphan-guaiFENesin  1 tablet Oral BID  . enoxaparin (LOVENOX) injection  30 mg Subcutaneous QHS  . feeding supplement (ENSURE ENLIVE)  237 mL Oral BID BM  . fluticasone  1 spray Each Nare Daily  . insulin aspart  0-20 Units Subcutaneous TID WC  . insulin aspart  0-5 Units Subcutaneous QHS  . insulin aspart  6 Units Subcutaneous TID WC   . insulin glargine  40 Units Subcutaneous BID  . lactose free nutrition  237 mL Oral TID BM  . loratadine  10 mg Oral Daily  . metoprolol succinate  50 mg Oral Daily  . nicotine  21 mg Transdermal Daily  . pantoprazole  40 mg Oral Daily   Continuous Infusions: . cefTRIAXone (ROCEPHIN)  IV 1 g (02/17/18 0905)     LOS: 3 days   Charlynne Cousins  [facility]   *Please refer to [email or web address], password [email or web address] to get updated schedule on who will round on this patient, as hospitalists switch teams weekly. If 7PM-7AM, please contact night-coverage at [email or web address], password [email or web address] for any overnight needs.  02/17/2018, 10:10 AM

## 2018-02-17 NOTE — Progress Notes (Signed)
Initial Nutrition Assessment  DOCUMENTATION CODES:   Severe malnutrition in context of chronic illness, Underweight  INTERVENTION:  - Will d/c Ensure Enlive, per patient preference. - Continue Boost Plus TID, each supplement provides 360 kcal and 14 grams of protein. - Will order daily multivitamin with minerals. - Continue to encourage PO intakes.    NUTRITION DIAGNOSIS:   Severe Malnutrition related to chronic illness, catabolic illness, cancer and cancer related treatments as evidenced by moderate muscle depletion, severe muscle depletion, moderate fat depletion, severe fat depletion.  GOAL:   Patient will meet greater than or equal to 90% of their needs  MONITOR:   PO intake, Supplement acceptance, Weight trends, Labs  REASON FOR ASSESSMENT:   Malnutrition Screening Tool  ASSESSMENT:   77 y.o. female with medical history significant of stage 4 small cell lung cancer currently on immunotherapy, CAD (prior MI, PCI), type 2 DM, HTN, hyperlipidemia, and prior CVA. She presented to the ED for evaluation of cough and fatigue. Patient reports having a one-week history of non-productive cough and fatigue. Daughter reported that patient has been feeling dizzy.  Patient denies having any fevers, chills, chest pain, shortness of breath, body aches, nausea, or vomiting.  Per chart review, patient consumed 20% of breakfast and dinner and 25% of lunch yesterday. She reports consuming 100% of breakfast this AM which was a muffin and a cup of coffee.   Patient was seen by [facility] RD on 11/6 at which time patient had reported having a good appetite and drinking 2 bottles of High Protein Boost/day. Patient does not feel that her appetite has changed since that time. She confirms that she prefers Boost and does not like Ensure. She has many snacks in her room (honey buns, cups of soup, nabs) and reports that the honey buns are her favorite of the available items.   She denies any difficulties  chewing or swallowing and denies any abdominal pain or nausea. Patient denied any nutrition-related questions or concerns at this time. Encouraged her to eat as well as she can and to consume Boost shakes. [place] RD note indicated plan for follow-up with patient on 12/18 during infusion.  NFPE outlined below. Per chart review, current weight is 78 lb and weight has been stable since 10/28/17.   Medications reviewed; sliding scale Novolog, 6 units Novolog TID, 30 units Lantus BID. Labs reviewed; CBGs: 246 and 297 mg/dL today, Na: 131 mmol/L, Ca: 8.1 mg/dL.      NUTRITION - FOCUSED PHYSICAL EXAM:    Most Recent Value  Orbital Region  Moderate depletion  Upper Arm Region  Severe depletion  Thoracic and Lumbar Region  Severe depletion  Buccal Region  Moderate depletion  Temple Region  Moderate depletion  Clavicle Bone Region  Severe depletion  Clavicle and Acromion Bone Region  Severe depletion  Scapular Bone Region  Unable to assess  Dorsal Hand  Moderate depletion  Patellar Region  Moderate depletion  Anterior Thigh Region  Severe depletion  Posterior Calf Region  Severe depletion  Edema (RD Assessment)  None  Hair  Reviewed  Eyes  Reviewed  Mouth  Reviewed  Skin  Reviewed  Nails  Reviewed       Diet Order:   Diet Order            Diet regular Room service appropriate? Yes; Fluid consistency: Thin  Diet effective now              EDUCATION NEEDS:   No education needs have  been identified at this time  Skin:  Skin Assessment: Reviewed RN Assessment  Last BM:  11/10  Height:   Ht Readings from Last 1 Encounters:  02/14/18 5\' 1"  (1.549 m)    Weight:   Wt Readings from Last 1 Encounters:  02/14/18 35.4 kg    Ideal Body Weight:  47.73 kg  BMI:  Body mass index is 14.74 kg/m.  Estimated Nutritional Needs:   Kcal:  1595-1945 (45-55 kcal/kg)  Protein:  70-88 grams (2-2.5 grams/kg)  Fluid:  >/= 1.7 L/day     Jarome Matin, MS, RD, LDN,  [facility] Inpatient Clinical Dietitian Pager # [phone] After hours/weekend pager # [phone]

## 2018-02-17 NOTE — Progress Notes (Addendum)
Katherine Blinks, NP paged for CGF 403, pt symptomatic. Awaiting callback/orders

## 2018-02-18 ENCOUNTER — Telehealth: Payer: Self-pay | Admitting: Medical Oncology

## 2018-02-18 DIAGNOSIS — G936 Cerebral edema: Secondary | ICD-10-CM

## 2018-02-18 DIAGNOSIS — D649 Anemia, unspecified: Secondary | ICD-10-CM

## 2018-02-18 DIAGNOSIS — R531 Weakness: Secondary | ICD-10-CM

## 2018-02-18 DIAGNOSIS — J189 Pneumonia, unspecified organism: Secondary | ICD-10-CM

## 2018-02-18 DIAGNOSIS — C3490 Malignant neoplasm of unspecified part of unspecified bronchus or lung: Secondary | ICD-10-CM

## 2018-02-18 DIAGNOSIS — C7931 Secondary malignant neoplasm of brain: Secondary | ICD-10-CM

## 2018-02-18 DIAGNOSIS — R5383 Other fatigue: Secondary | ICD-10-CM

## 2018-02-18 LAB — GLUCOSE, CAPILLARY
GLUCOSE-CAPILLARY: 105 mg/dL — AB (ref 70–99)
Glucose-Capillary: 102 mg/dL — ABNORMAL HIGH (ref 70–99)

## 2018-02-18 LAB — BASIC METABOLIC PANEL
Anion gap: 6 (ref 5–15)
BUN: 23 mg/dL (ref 8–23)
CALCIUM: 8.5 mg/dL — AB (ref 8.9–10.3)
CO2: 27 mmol/L (ref 22–32)
CREATININE: 0.7 mg/dL (ref 0.44–1.00)
Chloride: 98 mmol/L (ref 98–111)
GFR calc Af Amer: >60 mL/min (ref >60)
GLUCOSE: 177 mg/dL — AB (ref 70–99)
POTASSIUM: 3.9 mmol/L (ref 3.5–5.1)
Sodium: 131 mmol/L — ABNORMAL LOW (ref 135–145)

## 2018-02-18 MED ORDER — AMOXICILLIN-POT CLAVULANATE 875-125 MG PO TABS: 1.0000 | ORAL_TABLET | Freq: Two times a day (BID) | ORAL | Status: DC

## 2018-02-18 MED ORDER — DM-GUAIFENESIN ER 30-600 MG PO TB12: 1.0000 | ORAL_TABLET | Freq: Two times a day (BID) | ORAL | 0 refills | Status: AC

## 2018-02-18 MED ORDER — AZITHROMYCIN 250 MG PO TABS: 250.0000 mg | ORAL_TABLET | Freq: Every day | ORAL | 0 refills | Status: AC

## 2018-02-18 MED ORDER — NICOTINE 21 MG/24HR TD PT24: 21.0000 mg | MEDICATED_PATCH | Freq: Every day | TRANSDERMAL | 0 refills | Status: AC

## 2018-02-18 MED ORDER — HEPARIN SOD (PORK) LOCK FLUSH 100 UNIT/ML IV SOLN
500.0000 [IU] | INTRAVENOUS | Status: AC | PRN
  Administered 2018-02-18: 500 [IU]

## 2018-02-18 MED ORDER — AMOXICILLIN-POT CLAVULANATE 875-125 MG PO TABS: 1.0000 | ORAL_TABLET | Freq: Two times a day (BID) | ORAL | 0 refills | Status: AC

## 2018-02-18 MED ORDER — POLYETHYLENE GLYCOL 3350 17 G PO PACK: 17.0000 g | PACK | Freq: Every day | ORAL | Status: DC

## 2018-02-18 MED ORDER — SENNOSIDES-DOCUSATE SODIUM 8.6-50 MG PO TABS: 1.0000 | ORAL_TABLET | Freq: Two times a day (BID) | ORAL | Status: DC | PRN

## 2018-02-18 MED ORDER — POLYETHYLENE GLYCOL 3350 17 G PO PACK: 17.0000 g | PACK | Freq: Every day | ORAL | Status: DC | PRN

## 2018-02-18 MED ORDER — DEXAMETHASONE 4 MG PO TABS: 4.0000 mg | ORAL_TABLET | Freq: Four times a day (QID) | ORAL | 0 refills | Status: AC

## 2018-02-18 MED ORDER — SODIUM CHLORIDE 0.9 % IV SOLN
INTRAVENOUS | Status: DC | PRN
  Administered 2018-02-18: 250 mL via INTRAVENOUS

## 2018-02-18 NOTE — Progress Notes (Signed)
Patient discharged home as ordered. Printed discharge instructions explained and given to pt & family who verbalized understanding. Family aware of where to pick up prescriptions. Pt CBG 70 prior to D/C- drank OJ and ate a snack. Patient A&O upon D/C, denied pain and discomfort. Assisted to pt's vehicle by W/C with nursing staff.

## 2018-02-18 NOTE — Care Management Important Message (Signed)
Important Message  Patient Details  Name: Katherine Cole MRN: [id number] Date of Birth: 06-08-1940   Medicare Important Message Given:  Yes    Kerin Salen 02/18/2018, 12:11 [place] Message  Patient Details  Name: Katherine Cole MRN: [id number] Date of Birth: 1940-06-22   Medicare Important Message Given:  Yes    Kerin Salen 02/18/2018, 12:11 PM

## 2018-02-18 NOTE — Progress Notes (Signed)
[facility] ([facility])    Notified by Genoveva Ill of family request for [facility] services at home after discharge.  Chart and patient information under review at this time and eligibility is pending at this time.     Spoke with patient and her daughter Lynelle Smoke at the bedside, confirmed interest, answered questions and provided information regarding hospice services and philosophy.    Plan is to discharge later today POV with her daughter Lynelle Smoke.     Please send signed and completed DNR home with the patient   Patient will need prescriptions for discharge comfort medications (if necessary).   DME needs discussed, patient currently has the following equipment in the home:  Borrowed walker.  Will order walker, BSC per request of family.  [facility] equipment specialist has been notified and will contact [place] to have this delivered.   [facility] is aware of the above information.  Completed discharge summary should be faxed to [phone] once eligibility confirmed and completed.     Please notify [facility] at [phone] (if after 5 call [phone]) when the pt is ready to leave the unit.    [facility] information given to [name] and patient.   Above information shared with [facility]   Thank you for this referral Venia Carbon BSN, RN [facility] Liaison (listed in [place]) [phone]?

## 2018-02-18 NOTE — Progress Notes (Signed)
Subjective: The patient is seen and examined today.  Her daughter was at the bedside.  This is a very pleasant 77 years old white female with extensive stage small cell lung cancer status post induction systemic chemotherapy with carboplatin and etoposide status post 6 cycles with partial response.  The patient was in observation for few months and restaging scans showed evidence for disease progression.  She was a started on immunotherapy with nivolumab every 2 weeks status post 6 cycles.  She has been tolerating this treatment well and last imaging studies showed some evidence for mild disease progression.  She was recently admitted to [facility] with increasing weakness and fatigue as well as confusion.  MRI of the brain showed multiple new metastatic brain lesions, the majority these are subcentimeter in size.  Patient was a started on Decadron and she felt much better.  I was consulted to see the patient today for evaluation and recommendation regarding her current disease status, treatment options and prognosis.  When seen today she is feeling much better.  She denied having any nausea, vomiting, diarrhea or constipation.  She denied having any chest pain or shortness of breath.  Objective: Vital signs in last 24 hours: Temp:  [98 F (36.7 C)-98.9 F (37.2 C)] 98 F (36.7 C) (11/13 0429) Pulse Rate:  [56-68] 56 (11/13 0429) Resp:  [16-18] 16 (11/13 0429) BP: (130-154)/(57-69) 154/69 (11/13 0429) SpO2:  [96 %-99 %] 96 % (11/13 0429)  Intake/Output from previous day: 11/12 0701 - 11/13 0700 In: 240 [P.O.:240] Out: -  Intake/Output this shift: No intake/output data recorded.  General appearance: alert, cooperative, fatigued and no distress Resp: clear to auscultation bilaterally Cardio: regular rate and rhythm, S1, S2 normal, no murmur, click, rub or gallop GI: soft, non-tender; bowel sounds normal; no masses,  no organomegaly Extremities: extremities normal, atraumatic, no  cyanosis or edema  Lab Results:  Recent Labs    02/16/18 0317  WBC 7.1  HGB 9.1*  HCT 27.4*  PLT 221   BMET Recent Labs    02/17/18 0459 02/18/18 0458  NA 131* 131*  K 4.4 3.9  CL 99 98  CO2 25 27  GLUCOSE 303* 177*  BUN 12 23  CREATININE 0.61 0.70  CALCIUM 8.1* 8.5*    Studies/Results: Ct Head Wo Contrast  Result Date: 02/16/2018 CLINICAL DATA:  Small cell lung cancer. EXAM: CT HEAD WITHOUT CONTRAST TECHNIQUE: Contiguous axial images were obtained from the base of the skull through the vertex without intravenous contrast. COMPARISON:  Head CT 05/29/2015 FINDINGS: Brain: Vasogenic edema within the superior left frontal lobe, concerning for metastasis. No other edema. No midline shift, hemorrhage or herniation. No hydrocephalus. Mild generalized atrophy. Areas of hypoattenuation of the deep gray nuclei and confluent periventricular white matter hypodensity, consistent with chronic small vessel disease. Vascular: Atherosclerotic calcification of the internal carotid arteries at the skull base. No abnormal hyperdensity of the major intracranial arteries or dural venous sinuses. Skull: The visualized skull base, calvarium and extracranial soft tissues are normal. Sinuses/Orbits: No fluid levels or advanced mucosal thickening of the visualized paranasal sinuses. No mastoid or middle ear effusion. The orbits are normal. IMPRESSION: 1. Vasogenic edema pattern within the superior left frontal lobe is concerning for metastatic disease. MRI of the brain with and without contrast is recommended if there are no contraindications. Otherwise, head CT with contrast might be helpful for better characterization of metastatic lesions. 2. No hemorrhage, herniation or hydrocephalus. Electronically Signed   By: Lennette Bihari  Collins [place] M.D.   On: 02/16/2018 15:34   Mr Jeri Cos TI Contrast  Result Date: 02/17/2018 CLINICAL DATA:  Altered mental status. Stage IV small cell lung cancer EXAM: MRI HEAD WITHOUT AND WITH  CONTRAST TECHNIQUE: Multiplanar, multiecho pulse sequences of the brain and surrounding structures were obtained without and with intravenous contrast. CONTRAST:  3.5 mL Gadavist COMPARISON:  Head CT 02/16/2018 FINDINGS: BRAIN: There are 3 discrete areas of abnormal diffusion restriction within the posterior left hemisphere, within the parietal lobe. The midline structures are normal. No midline shift or other mass effect. There is vasogenic edema within the left parietal lobe. Mild edema in the right medial cerebellum. 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 Multifocal white matter hyperintensity, most commonly due to chronic ischemic microangiopathy. There is a small amount of petechial hemorrhage associated with 1 of the left parietal lesions (series 9, image 15). This is likely chronic. Mild generalized atrophy. On postcontrast imaging, there are 5 enhancing lesions: 1. Right cerebellum, 4 mm.  Series 12, image 18. 2. Posterior left parietal white matter, 6 mm.  Image 33. 3. Left paramedian parietal lobe, 11 x 8 mm.  Image 36 4. Posterosuperior left parietal lobe, 6 x 7 mm.  Image 41 5. Superior left parietal lobe, 3 mm.  Image 44. VASCULAR: Major intracranial arterial and venous sinus flow voids are normal. SKULL AND UPPER CERVICAL SPINE: Calvarial bone marrow signal is normal. There is no skull base mass. Visualized upper cervical spine and soft tissues are normal. SINUSES/ORBITS: No fluid levels or advanced mucosal thickening. No mastoid or middle ear effusion. The orbits are normal. IMPRESSION: 1. Five discrete contrast enhancing metastatic lesions. Four are located in the left parietal lobe and 1 in the right cerebellar hemisphere. 2. Moderate, confluent vasogenic edema surrounding the parietal lobe lesions. 3. Minimal edema of the right cerebellar lesion. Electronically Signed   By: Ulyses Jarred M.D.   On: 02/17/2018 14:54    Medications: I have reviewed the patient's current  medications.  CODE STATUS: No CODE BLUE  Assessment/Plan: This is a very pleasant 77 years old white female with extensive stage small cell lung cancer status post first-line palliative systemic chemotherapy with carboplatin and etoposide with partial response followed by disease progression.  The patient was a started on treatment with immunotherapy was Nivolumab with mild disease progression systemically but she has multiple new metastatic brain lesions. I had a lengthy discussion with the patient and her daughter about her current disease status and treatment options.  I offered the patient referral to radiation oncology for consideration of palliative radiotherapy to the brain versus palliative care and hospice referral taken into consideration her poor general status and disease progression systemically.  After discussion of these options the patient is not interested in proceeding with whole brain irradiation and she would consider going home with palliative care and hospice.  We will continue with the supportive care with a taper dose of Decadron for the metastatic brain lesions.  I will be happy to take care of her hospice needs after discharge.  The patient and her daughter were very appreciative of the care she received at the [facility].  She was advised to call immediately if she has any concerning issues or complaints after discharge.   LOS: 4 days    [name] 02/18/2018

## 2018-02-18 NOTE — Discharge Summary (Signed)
Discharge Summary  Katherine Cole WNU:[id number] DOB: 1941/01/08  PCP: Prince Solian, MD  Admit date: 02/14/2018 Discharge date: 02/22/2018   Time spent: < 25 minutes  Admitted From: Home Disposition: Home  Recommendations for Outpatient Follow-up:  1. Decadron 4 mg q6H , follow up with oncology 2. Home with Hospice care 3. Continue Augmentin for 3 more days    Discharge Diagnoses:  Active Hospital Problems   Diagnosis Date Noted  . Multifocal pneumonia 02/14/2018  . Vasogenic brain edema ([place]) 02/17/2018  . Protein-calorie malnutrition, severe 02/17/2018  . Chronic anemia 02/14/2018  . Hyponatremia 02/14/2018  . Type 2 diabetes mellitus ([place]) 02/14/2018  . Hypomagnesemia 02/14/2018  . Anxiety 02/14/2018  . COPD II still smoking  08/21/2017  . Sepsis ([place]) 08/03/2017  . Lung cancer metastatic to brain ([place]) 07/12/2017  . Protein calorie malnutrition ([place]) 07/12/2017  . Essential hypertension 02/10/2012  . CAD (coronary artery disease) 02/10/2012  . Hyperlipidemia 08/01/2007    Resolved Hospital Problems  No resolved problems to display.    Discharge Condition: guarded   CODE STATUS:DNR  History of present illness:  Katherine Cole is a 77 y.o. year old female with medical history significant for Stage IV small cell lung cancer, type 2 diabetes who presented on 02/14/2018 with 1 week of nonproductive cough, fatigue and worsening confusion and was found to have sepsis secondary to multifocal pneumonia and new metastatic brain lesions in setting of extensive small cell lung cancer. Remaining hospital course addressed in problem based format below:   Hospital Course:   New metastatic brain lesions in patient with extensive stage small cell lung cancer  Presented with along with increasing weakness and fatigue as well as confusion. MRI brain showed multiple new metastatic brain lesions with vasogenic edema.  She was started on Decadron and symptomatically improved.   She discussed with her oncologist Dr. Julien Nordmann expressed not being interested in whole brain irradiation and electing to go home with palliative care and hospice.  She will continue supportive care with a taper dose of Decadron for her metastatic brain lesions.  Sepsis secondary to multifocal pneumonia. Presented with cough, nonproductive and fatigue for 1 week.On admission had T-max 99.3 with tachypnea in the 30s.  Respiratory panel was negative.  White count was elevated at 10.9 and chest x-ray showed patchy opacities in bilateral mid lower lungs consistent with likely infectious process.  In the ED patient was empirically started on vancomycin and cefepime.  Her blood cultures remained negative and sepsis physiology resolved so she was de-escalated to IV ceftriaxone and azithromycin for 4 days.  She will continue her course of antibiotics with Augmentin on discharge.  Hyponatremia in the setting of hyperglycemia.  Improved with control of blood glucose.  Normocytic anemia, chronic, remained at baseline  Type 2 diabetes, A1c of 9.4.  She was maintained on her Lantus dose in the setting of steroid use.  Consultations:  Oncology  Procedures/Studies: none  Discharge Exam: BP 140/74 (BP Location: Right Arm)   Pulse 63   Temp 99.1 F (37.3 C) (Oral)   Resp 16   Ht 5\' 1"  (1.549 m)   Wt 35.4 kg   SpO2 97%   BMI 14.74 kg/m   General: Lying in bed, no apparent distress Eyes: EOMI, anicteric Cardiovascular: regular rate and rhythm, no murmurs, rubs or gallops, no edema, Respiratory: Normal respiratory effort, lungs clear to auscultation bilaterally Abdomen: soft, non-distended, non-tender, normal bowel sounds Skin: No Rash Neurologic: Grossly no focal neuro deficit.Mental  status AAOx3, speech normal, Psychiatric:Appropriate affect, and mood   Discharge Instructions You were cared for by a hospitalist during your hospital stay. If you have any questions about your discharge medications  or the care you received while you were in the hospital after you are discharged, you can call the unit and asked to speak with the hospitalist on call if the hospitalist that took care of you is not available. Once you are discharged, your primary care physician will handle any further medical issues. Please note that NO REFILLS for any discharge medications will be authorized once you are discharged, as it is imperative that you return to your primary care physician (or establish a relationship with a primary care physician if you do not have one) for your aftercare needs so that they can reassess your need for medications and monitor your lab values.  Discharge Instructions    Diet - low sodium heart healthy   Complete by:  As directed    Increase activity slowly   Complete by:  As directed      Allergies as of 02/18/2018      Reactions   Codeine Other (See Comments)   "gives me a migraine" (03/11/2012)   Morphine And Related Other (See Comments)   "gives me a migraine" (03/11/2012)      Medication List    TAKE these medications   ALPRAZolam 0.5 MG tablet Commonly known as:  XANAX Take 0.5 mg by mouth at bedtime.   amLODipine 10 MG tablet Commonly known as:  NORVASC Take 1 tablet (10 mg total) by mouth daily.   amoxicillin-clavulanate 875-125 MG tablet Commonly known as:  AUGMENTIN Take 1 tablet by mouth every 12 (twelve) hours for 3 days.   aspirin 81 MG tablet Take 1 tablet (81 mg total) by mouth daily. What changed:    when to take this  additional instructions   atorvastatin 80 MG tablet Commonly known as:  LIPITOR Take 80 mg by mouth daily.   clopidogrel 75 MG tablet Commonly known as:  PLAVIX Take 75 mg by mouth daily.   dexamethasone 4 MG tablet Commonly known as:  DECADRON Take 1 tablet (4 mg total) by mouth 4 (four) times daily.   dextromethorphan-guaiFENesin 30-600 MG 12hr tablet Commonly known as:  MUCINEX DM Take 1 tablet by mouth 2 (two) times  daily.   fluticasone 50 MCG/ACT nasal spray Commonly known as:  FLONASE PLACE 1 SPRAY INTO BOTH NOSTRILS DAILY.   guaiFENesin 600 MG 12 hr tablet Commonly known as:  MUCINEX Take 2 tablets (1,200 mg total) by mouth 2 (two) times daily as needed (Use for one week post discharge, then twice daily as needed).   HUMALOG KWIKPEN 100 UNIT/ML KwikPen Generic drug:  insulin lispro 0-8 units three times daily after meals   insulin glargine 100 UNIT/ML injection Commonly known as:  LANTUS Inject 10 Units into the skin daily.   ipratropium-albuterol 0.5-2.5 (3) MG/3ML Soln Commonly known as:  DUONEB Take 3 mLs by nebulization every 6 (six) hours as needed (Shortness of breath or wheezing).   lactose free nutrition Liqd Take 237 mLs by mouth 3 (three) times daily between meals.   lidocaine-prilocaine cream Commonly known as:  EMLA Apply 1 application topically as needed. Apply topically to port site 1-2 hours prior to chemotherapy or labs. Cover with plastic wrap.   loratadine 10 MG tablet Commonly known as:  CLARITIN Take 10 mg by mouth daily.   metFORMIN 1000 MG tablet Commonly known  as:  GLUCOPHAGE Take 1,000 mg by mouth daily.   metoprolol succinate 50 MG 24 hr tablet Commonly known as:  TOPROL-XL Take 50 mg by mouth daily. Take with or immediately following a meal.   nicotine 21 mg/24hr patch Commonly known as:  NICODERM CQ - dosed in mg/24 hours Place 1 patch (21 mg total) onto the skin daily.   nitroGLYCERIN 0.4 MG SL tablet Commonly known as:  NITROSTAT Place 1 tablet (0.4 mg total) under the tongue every 5 (five) minutes as needed (up to 3 doses). For chest pain   ondansetron 4 MG disintegrating tablet Commonly known as:  ZOFRAN-ODT Take 1 tablet (4 mg total) by mouth every 8 (eight) hours as needed for nausea or vomiting.   prochlorperazine 10 MG tablet Commonly known as:  COMPAZINE Take 1 tablet (10 mg total) by mouth every 6 (six) hours as needed for nausea or  vomiting.   promethazine 12.5 MG tablet Commonly known as:  PHENERGAN Take 1 tablet (12.5 mg total) by mouth every 4 (four) hours as needed for nausea or vomiting.   sitaGLIPtin 100 MG tablet Commonly known as:  JANUVIA Take 0.5 tablets (50 mg total) by mouth daily.   sodium chloride 0.65 % Soln nasal spray Commonly known as:  OCEAN Place 1 spray into both nostrils as needed for congestion (use twice daily for one week post discharge, then as needed).     ASK your doctor about these medications   azithromycin 250 MG tablet Commonly known as:  ZITHROMAX Take 1 tablet (250 mg total) by mouth daily for 3 days. Ask about: Should I take this medication?      Allergies  Allergen Reactions  . Codeine Other (See Comments)    "gives me a migraine" (03/11/2012)  . Morphine And Related Other (See Comments)    "gives me a migraine" (03/11/2012)   Follow-up Information    [facility].   Specialty:  Home Health Services Why:  [facility] physical therapy Contact information: [place] [address] [place] Follow up.   Why:  home visit Contact information: [place] [phone]           The results of significant diagnostics from this hospitalization (including imaging, microbiology, ancillary and laboratory) are listed below for reference.    Significant Diagnostic Studies: Dg Chest 2 View  Result Date: 02/14/2018 CLINICAL DATA:  Cough, fever EXAM: CHEST - 2 VIEW COMPARISON:  Chest radiograph 10/28/2017. FINDINGS: Right anterior chest wall Port-A-Cath is present with tip projecting over the superior vena cava. Stable cardiac and mediastinal contours. Monitoring leads overlie the patient. Pulmonary hyperinflation. Patchy consolidative opacities within the right mid and lower lung and left lower lung. Re-demonstrated subpleural mass left upper  hemithorax. No pleural effusion or pneumothorax. IMPRESSION: Patchy opacities within the bilateral mid and lower lungs may represent infectious process. Re demonstrated peripheral mass left upper lung. Electronically Signed   By: Lovey Newcomer M.D.   On: 02/14/2018 18:24   Ct Head Wo Contrast  Result Date: 02/16/2018 CLINICAL DATA:  Small cell lung cancer. EXAM: CT HEAD WITHOUT CONTRAST TECHNIQUE: Contiguous axial images were obtained from the base of the skull through the vertex without intravenous contrast. COMPARISON:  Head CT 05/29/2015 FINDINGS: Brain: Vasogenic edema within the superior left frontal lobe, concerning for metastasis. No other edema. No midline shift, hemorrhage or herniation.  No hydrocephalus. Mild generalized atrophy. Areas of hypoattenuation of the deep gray nuclei and confluent periventricular white matter hypodensity, consistent with chronic small vessel disease. Vascular: Atherosclerotic calcification of the internal carotid arteries at the skull base. No abnormal hyperdensity of the major intracranial arteries or dural venous sinuses. Skull: The visualized skull base, calvarium and extracranial soft tissues are normal. Sinuses/Orbits: No fluid levels or advanced mucosal thickening of the visualized paranasal sinuses. No mastoid or middle ear effusion. The orbits are normal. IMPRESSION: 1. Vasogenic edema pattern within the superior left frontal lobe is concerning for metastatic disease. MRI of the brain with and without contrast is recommended if there are no contraindications. Otherwise, head CT with contrast might be helpful for better characterization of metastatic lesions. 2. No hemorrhage, herniation or hydrocephalus. Electronically Signed   By: Ulyses Jarred M.D.   On: 02/16/2018 15:34   Ct Chest W Contrast  Result Date: 02/09/2018 CLINICAL DATA:  Left upper lobe small cell lung cancer. EXAM: CT CHEST, ABDOMEN, AND PELVIS WITH CONTRAST TECHNIQUE: Multidetector CT imaging of  the chest, abdomen and pelvis was performed following the standard protocol during bolus administration of intravenous contrast. CONTRAST:  73mL OMNIPAQUE IOHEXOL 300 MG/ML  SOLN COMPARISON:  12/01/2017 FINDINGS: CT CHEST FINDINGS Cardiovascular: The heart size is normal. No substantial pericardial effusion. Coronary artery calcification is evident. Atherosclerotic calcification is noted in the wall of the thoracic aorta. Right Port-A-Cath tip is positioned in the distal SVC. Mediastinum/Nodes: No mediastinal lymphadenopathy. 19 mm short axis AP window lymph node seen previously is now 7 mm short axis. Left hilar lymphadenopathy seen on the previous exam has decreased with previous 17 mm short axis left hilar node now measuring 7 mm. There is no hilar lymphadenopathy. The esophagus has normal imaging features. 10 x 10 mm left axillary lymph node (16/2) has progressed in the interval. There is an 8 mm short axis left subpectoral lymph node (13/2), new since prior.Tiny bilateral thyroid nodules again noted. Lungs/Pleura: Secretions noted in the trachea. Peripheral left upper lobe lesion measured previously at 2.0 x 3.4 cm now measures 1.9 x 3.2 cm. The more central left upper lobe nodule described as "elongated" on the prior study has decreased in the interval measuring 2.3 x 0.5 cm today compared to 3.0 x 1.0 cm previously. Areas of small airway impaction noted peripheral left upper lobe, as before. Scattered bronchiectasis in the right middle lobe and lingula is similar. Airway impaction noted in the left lower lobe and right middle lobe. 6 mm posterior left lower lobe nodule (103/7) is stable. Other scattered tiny bilateral pulmonary nodules show no substantial change. No edema or pleural effusion. Musculoskeletal: No worrisome lytic or sclerotic osseous abnormality. CT ABDOMEN PELVIS FINDINGS Hepatobiliary: Subcapsular low-density lesion in the dome of the left liver is similar to prior measuring 1.1 x 1.5 cm  today (49/2) compared to 1.0 x 1.4 cm previously. No other suspicious focal liver abnormality evident. There is no evidence for gallstones, gallbladder wall thickening, or pericholecystic fluid. No intrahepatic or extrahepatic biliary dilation. Pancreas: No focal mass lesion. No dilatation of the main duct. No intraparenchymal cyst. No peripancreatic edema. Spleen: No splenomegaly. No focal mass lesion. Adrenals/Urinary Tract: No adrenal nodule or mass. Right kidney unremarkable. Tiny hypodensity upper pole left kidney is similar to prior and probably a tiny cyst. No evidence for hydroureter. The urinary bladder appears normal for the degree of distention. Stomach/Bowel: Stomach is nondistended. No gastric wall thickening. No evidence of outlet obstruction. Duodenum is  normally positioned as is the ligament of Treitz. No small bowel wall thickening. No small bowel dilatation. The terminal ileum is normal. The appendix is not visualized, but there is no edema or inflammation in the region of the cecum. No gross colonic mass. No colonic wall thickening. No substantial diverticular change. Vascular/Lymphatic: Fusiform ectasia infrarenal abdominal aorta is similar to prior measuring 3.2 x 2.9 cm today compared to 3.2 x 2.8 cm previously. There is no gastrohepatic or hepatoduodenal ligament lymphadenopathy. No intraperitoneal or retroperitoneal lymphadenopathy. No pelvic sidewall lymphadenopathy. Reproductive: Unremarkable. Other: No intraperitoneal free fluid. Musculoskeletal: Orthopedic hardware noted right proximal femur. No worrisome lytic or sclerotic osseous abnormality. IMPRESSION: 1. Interval next response to therapy. 2. Interval development of new mild lymphadenopathy in the left axilla and left subpectoral space, concerning for disease progression. 3. AP window and left hilar lymphadenopathy has clearly decreased in the interval. 4. Peripheral left upper lobe lesion is similar to prior while the more central  elongated left upper lobe lesion has decreased in the interval. 5. Stable appearance low-density lesion segment 2 of the left liver. 6.  Aortic Atherosclerois (ICD10-170.0) Electronically Signed   By: Misty Stanley M.D.   On: 02/09/2018 13:04   Mr Jeri Cos TD Contrast  Result Date: 02/17/2018 CLINICAL DATA:  Altered mental status. Stage IV small cell lung cancer EXAM: MRI HEAD WITHOUT AND WITH CONTRAST TECHNIQUE: Multiplanar, multiecho pulse sequences of the brain and surrounding structures were obtained without and with intravenous contrast. CONTRAST:  3.5 mL Gadavist COMPARISON:  Head CT 02/16/2018 FINDINGS: BRAIN: There are 3 discrete areas of abnormal diffusion restriction within the posterior left hemisphere, within the parietal lobe. The midline structures are normal. No midline shift or other mass effect. There is vasogenic edema within the left parietal lobe. Mild edema in the right medial cerebellum. 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 Multifocal white matter hyperintensity, most commonly due to chronic ischemic microangiopathy. There is a small amount of petechial hemorrhage associated with 1 of the left parietal lesions (series 9, image 15). This is likely chronic. Mild generalized atrophy. On postcontrast imaging, there are 5 enhancing lesions: 1. Right cerebellum, 4 mm.  Series 12, image 18. 2. Posterior left parietal white matter, 6 mm.  Image 33. 3. Left paramedian parietal lobe, 11 x 8 mm.  Image 36 4. Posterosuperior left parietal lobe, 6 x 7 mm.  Image 41 5. Superior left parietal lobe, 3 mm.  Image 44. VASCULAR: Major intracranial arterial and venous sinus flow voids are normal. SKULL AND UPPER CERVICAL SPINE: Calvarial bone marrow signal is normal. There is no skull base mass. Visualized upper cervical spine and soft tissues are normal. SINUSES/ORBITS: No fluid levels or advanced mucosal thickening. No mastoid or middle ear effusion. The orbits are normal. IMPRESSION: 1. Five  discrete contrast enhancing metastatic lesions. Four are located in the left parietal lobe and 1 in the right cerebellar hemisphere. 2. Moderate, confluent vasogenic edema surrounding the parietal lobe lesions. 3. Minimal edema of the right cerebellar lesion. Electronically Signed   By: Ulyses Jarred M.D.   On: 02/17/2018 14:54   Ct Abdomen Pelvis W Contrast  Result Date: 02/09/2018 CLINICAL DATA:  Left upper lobe small cell lung cancer. EXAM: CT CHEST, ABDOMEN, AND PELVIS WITH CONTRAST TECHNIQUE: Multidetector CT imaging of the chest, abdomen and pelvis was performed following the standard protocol during bolus administration of intravenous contrast. CONTRAST:  57mL OMNIPAQUE IOHEXOL 300 MG/ML  SOLN COMPARISON:  12/01/2017 FINDINGS: CT CHEST  FINDINGS Cardiovascular: The heart size is normal. No substantial pericardial effusion. Coronary artery calcification is evident. Atherosclerotic calcification is noted in the wall of the thoracic aorta. Right Port-A-Cath tip is positioned in the distal SVC. Mediastinum/Nodes: No mediastinal lymphadenopathy. 19 mm short axis AP window lymph node seen previously is now 7 mm short axis. Left hilar lymphadenopathy seen on the previous exam has decreased with previous 17 mm short axis left hilar node now measuring 7 mm. There is no hilar lymphadenopathy. The esophagus has normal imaging features. 10 x 10 mm left axillary lymph node (16/2) has progressed in the interval. There is an 8 mm short axis left subpectoral lymph node (13/2), new since prior.Tiny bilateral thyroid nodules again noted. Lungs/Pleura: Secretions noted in the trachea. Peripheral left upper lobe lesion measured previously at 2.0 x 3.4 cm now measures 1.9 x 3.2 cm. The more central left upper lobe nodule described as "elongated" on the prior study has decreased in the interval measuring 2.3 x 0.5 cm today compared to 3.0 x 1.0 cm previously. Areas of small airway impaction noted peripheral left upper lobe, as  before. Scattered bronchiectasis in the right middle lobe and lingula is similar. Airway impaction noted in the left lower lobe and right middle lobe. 6 mm posterior left lower lobe nodule (103/7) is stable. Other scattered tiny bilateral pulmonary nodules show no substantial change. No edema or pleural effusion. Musculoskeletal: No worrisome lytic or sclerotic osseous abnormality. CT ABDOMEN PELVIS FINDINGS Hepatobiliary: Subcapsular low-density lesion in the dome of the left liver is similar to prior measuring 1.1 x 1.5 cm today (49/2) compared to 1.0 x 1.4 cm previously. No other suspicious focal liver abnormality evident. There is no evidence for gallstones, gallbladder wall thickening, or pericholecystic fluid. No intrahepatic or extrahepatic biliary dilation. Pancreas: No focal mass lesion. No dilatation of the main duct. No intraparenchymal cyst. No peripancreatic edema. Spleen: No splenomegaly. No focal mass lesion. Adrenals/Urinary Tract: No adrenal nodule or mass. Right kidney unremarkable. Tiny hypodensity upper pole left kidney is similar to prior and probably a tiny cyst. No evidence for hydroureter. The urinary bladder appears normal for the degree of distention. Stomach/Bowel: Stomach is nondistended. No gastric wall thickening. No evidence of outlet obstruction. Duodenum is normally positioned as is the ligament of Treitz. No small bowel wall thickening. No small bowel dilatation. The terminal ileum is normal. The appendix is not visualized, but there is no edema or inflammation in the region of the cecum. No gross colonic mass. No colonic wall thickening. No substantial diverticular change. Vascular/Lymphatic: Fusiform ectasia infrarenal abdominal aorta is similar to prior measuring 3.2 x 2.9 cm today compared to 3.2 x 2.8 cm previously. There is no gastrohepatic or hepatoduodenal ligament lymphadenopathy. No intraperitoneal or retroperitoneal lymphadenopathy. No pelvic sidewall lymphadenopathy.  Reproductive: Unremarkable. Other: No intraperitoneal free fluid. Musculoskeletal: Orthopedic hardware noted right proximal femur. No worrisome lytic or sclerotic osseous abnormality. IMPRESSION: 1. Interval next response to therapy. 2. Interval development of new mild lymphadenopathy in the left axilla and left subpectoral space, concerning for disease progression. 3. AP window and left hilar lymphadenopathy has clearly decreased in the interval. 4. Peripheral left upper lobe lesion is similar to prior while the more central elongated left upper lobe lesion has decreased in the interval. 5. Stable appearance low-density lesion segment 2 of the left liver. 6.  Aortic Atherosclerois (ICD10-170.0) Electronically Signed   By: Misty Stanley M.D.   On: 02/09/2018 13:04    Microbiology: Recent Results (from the  past 240 hour(s))  Blood culture (routine x 2)     Status: None   Collection Time: 02/14/18  6:30 PM  Result Value Ref Range Status   Specimen Description   Final    BLOOD BLOOD LEFT FOREARM Performed at [facility] [address], [place], [place] [address]    Special Requests   Final    BOTTLES DRAWN AEROBIC AND ANAEROBIC Blood Culture adequate volume Performed at [facility] [address] [place], [place] [address]    Culture   Final    NO GROWTH 5 DAYS Performed at [facility], [place] [address] [place], [place] [address]    Report Status 02/19/2018 FINAL  Final  Blood culture (routine x 2)     Status: None   Collection Time: 02/14/18  6:40 PM  Result Value Ref Range Status   Specimen Description   Final    BLOOD RIGHT ANTECUBITAL Performed at [facility] [address], [place], [place] [address]    Special Requests   Final    BOTTLES DRAWN AEROBIC AND ANAEROBIC Blood Culture adequate volume Performed at [facility] [address], [place], [place] [address]    Culture   Final    NO  GROWTH 5 DAYS Performed at [facility], [place] [address] [place], [place] [address]    Report Status 02/19/2018 FINAL  Final  Respiratory Panel by PCR     Status: None   Collection Time: 02/15/18  3:48 PM  Result Value Ref Range Status   Adenovirus NOT DETECTED NOT DETECTED Final   Coronavirus 229E NOT DETECTED NOT DETECTED Final   Coronavirus HKU1 NOT DETECTED NOT DETECTED Final   Coronavirus NL63 NOT DETECTED NOT DETECTED Final   Coronavirus OC43 NOT DETECTED NOT DETECTED Final   Metapneumovirus NOT DETECTED NOT DETECTED Final   Rhinovirus / Enterovirus NOT DETECTED NOT DETECTED Final   Influenza A NOT DETECTED NOT DETECTED Final   Influenza B NOT DETECTED NOT DETECTED Final   Parainfluenza Virus 1 NOT DETECTED NOT DETECTED Final   Parainfluenza Virus 2 NOT DETECTED NOT DETECTED Final   Parainfluenza Virus 3 NOT DETECTED NOT DETECTED Final   Parainfluenza Virus 4 NOT DETECTED NOT DETECTED Final   Respiratory Syncytial Virus NOT DETECTED NOT DETECTED Final   Bordetella pertussis NOT DETECTED NOT DETECTED Final   Chlamydophila pneumoniae NOT DETECTED NOT DETECTED Final   Mycoplasma pneumoniae NOT DETECTED NOT DETECTED Final     Labs: Basic Metabolic Panel: Recent Labs  Lab 02/16/18 0317 02/17/18 0459 02/18/18 0458  NA 134* 131* 131*  K 3.3* 4.4 3.9  CL 102 99 98  CO2 26 25 27   GLUCOSE 66* 303* 177*  BUN 13 12 23   CREATININE 0.61 0.61 0.70  CALCIUM 8.0* 8.1* 8.5*   Liver Function Tests: No results for input(s): AST, ALT, ALKPHOS, BILITOT, PROT, ALBUMIN in the last 168 hours. No results for input(s): LIPASE, AMYLASE in the last 168 hours. No results for input(s): AMMONIA in the last 168 hours. CBC: Recent Labs  Lab 02/16/18 0317  WBC 7.1  HGB 9.1*  HCT 27.4*  MCV 98.6  PLT 221   Cardiac Enzymes: No results for input(s): CKTOTAL, CKMB, CKMBINDEX, TROPONINI in the last 168 hours. BNP: BNP (last 3 results) Recent Labs    08/14/17 2102  BNP 106.6*     ProBNP (last 3 results) No results for input(s): PROBNP in the last 8760 hours.  CBG: Recent Labs  Lab 02/17/18 2143 02/17/18 2241 02/18/18 0715 02/18/18 1132 02/18/18 1700  GLUCAP 361* 341* 102* 105* 71       Signed:  Desiree Hane, MD [facility] 02/22/2018, 5:41 PM

## 2018-02-18 NOTE — Telephone Encounter (Signed)
Dr Julien Nordmann will be attending for [place] notified.

## 2018-02-18 NOTE — Care Management Note (Signed)
Case Management Note  Patient Details  Name: Katherine Cole MRN: [id number] Date of Birth: Mar 08, 1941  Subjective/Objective:  [facility] acceptd,& they will  Provide a home visit for eval. Dr aware to sign out of facility DNR in shadow chart.Dtr will transport home.No further CM needs.                  Action/Plan:dc home w/hospice   Expected Discharge Date:                  Expected Discharge Plan:  Home w Hospice Care  In-House Referral:     Discharge planning Services  CM Consult  Post Acute Care Choice:    Choice offered to:  Adult Children  DME Arranged:    DME Agency:     HH Arranged:  RN [place] Agency:  [facility]  Status of Service:  Completed, signed off  If discussed at [place] of Stay Meetings, dates discussed:    Additional Comments:  Dessa Phi, RN 02/18/2018, 3:30 PM

## 2018-02-18 NOTE — Care Management Note (Signed)
Case Management Note  Patient Details  Name: ALICESON DOLBOW MRN: [id number] Date of Birth: October 09, 1940  Subjective/Objective:Met Lung Ca, new mets to brain.Onc following. CM referral for home hospice choice-spoke to patient/dtr in rm [name] they both agree to [facility] [place] to eval.Dtr wants pcp-Dr. Earlie Server. Out of facility DNR in shadow chart for attending to sign. Dtr will transport home on own.                   Action/Plan:dc home w/Hospice.   Expected Discharge Date:                  Expected Discharge Plan:  Home w Hospice Care  In-House Referral:     Discharge planning Services  CM Consult  Post Acute Care Choice:    Choice offered to:  Adult Children  DME Arranged:    DME Agency:     HH Arranged:    HH Agency:     Status of Service:  Completed, signed off  If discussed at [place] of Stay Meetings, dates discussed:    Additional Comments:  Dessa Phi, RN 02/18/2018, 3:05 PM

## 2018-02-19 DIAGNOSIS — C7951 Secondary malignant neoplasm of bone: Secondary | ICD-10-CM | POA: Diagnosis not present

## 2018-02-19 DIAGNOSIS — C3490 Malignant neoplasm of unspecified part of unspecified bronchus or lung: Secondary | ICD-10-CM | POA: Diagnosis not present

## 2018-02-19 DIAGNOSIS — I679 Cerebrovascular disease, unspecified: Secondary | ICD-10-CM | POA: Diagnosis not present

## 2018-02-19 DIAGNOSIS — C7931 Secondary malignant neoplasm of brain: Secondary | ICD-10-CM | POA: Diagnosis not present

## 2018-02-19 DIAGNOSIS — E43 Unspecified severe protein-calorie malnutrition: Secondary | ICD-10-CM | POA: Diagnosis not present

## 2018-02-19 DIAGNOSIS — E1159 Type 2 diabetes mellitus with other circulatory complications: Secondary | ICD-10-CM | POA: Diagnosis not present

## 2018-02-19 DIAGNOSIS — I2510 Atherosclerotic heart disease of native coronary artery without angina pectoris: Secondary | ICD-10-CM | POA: Diagnosis not present

## 2018-02-19 DIAGNOSIS — I10 Essential (primary) hypertension: Secondary | ICD-10-CM | POA: Diagnosis not present

## 2018-02-19 DIAGNOSIS — C787 Secondary malignant neoplasm of liver and intrahepatic bile duct: Secondary | ICD-10-CM | POA: Diagnosis not present

## 2018-02-19 DIAGNOSIS — F339 Major depressive disorder, recurrent, unspecified: Secondary | ICD-10-CM | POA: Diagnosis not present

## 2018-02-19 DIAGNOSIS — J189 Pneumonia, unspecified organism: Secondary | ICD-10-CM | POA: Diagnosis not present

## 2018-02-19 LAB — CULTURE, BLOOD (ROUTINE X 2)
Culture: NO GROWTH
Culture: NO GROWTH
SPECIAL REQUESTS: ADEQUATE
Special Requests: ADEQUATE

## 2018-02-19 LAB — GLUCOSE, CAPILLARY: Glucose-Capillary: 71 mg/dL (ref 70–99)

## 2018-02-20 DIAGNOSIS — C7951 Secondary malignant neoplasm of bone: Secondary | ICD-10-CM | POA: Diagnosis not present

## 2018-02-20 DIAGNOSIS — C787 Secondary malignant neoplasm of liver and intrahepatic bile duct: Secondary | ICD-10-CM | POA: Diagnosis not present

## 2018-02-20 DIAGNOSIS — E1159 Type 2 diabetes mellitus with other circulatory complications: Secondary | ICD-10-CM | POA: Diagnosis not present

## 2018-02-20 DIAGNOSIS — C3490 Malignant neoplasm of unspecified part of unspecified bronchus or lung: Secondary | ICD-10-CM | POA: Diagnosis not present

## 2018-02-20 DIAGNOSIS — E43 Unspecified severe protein-calorie malnutrition: Secondary | ICD-10-CM | POA: Diagnosis not present

## 2018-02-20 DIAGNOSIS — C7931 Secondary malignant neoplasm of brain: Secondary | ICD-10-CM | POA: Diagnosis not present

## 2018-02-24 DIAGNOSIS — C3490 Malignant neoplasm of unspecified part of unspecified bronchus or lung: Secondary | ICD-10-CM | POA: Diagnosis not present

## 2018-02-24 DIAGNOSIS — E43 Unspecified severe protein-calorie malnutrition: Secondary | ICD-10-CM | POA: Diagnosis not present

## 2018-02-24 DIAGNOSIS — C7951 Secondary malignant neoplasm of bone: Secondary | ICD-10-CM | POA: Diagnosis not present

## 2018-02-24 DIAGNOSIS — C787 Secondary malignant neoplasm of liver and intrahepatic bile duct: Secondary | ICD-10-CM | POA: Diagnosis not present

## 2018-02-24 DIAGNOSIS — E1159 Type 2 diabetes mellitus with other circulatory complications: Secondary | ICD-10-CM | POA: Diagnosis not present

## 2018-02-24 DIAGNOSIS — C7931 Secondary malignant neoplasm of brain: Secondary | ICD-10-CM | POA: Diagnosis not present

## 2018-02-25 ENCOUNTER — Inpatient Hospital Stay: Payer: Medicare Other | Admitting: Oncology

## 2018-02-25 ENCOUNTER — Inpatient Hospital Stay: Payer: Medicare Other

## 2018-02-26 ENCOUNTER — Telehealth: Payer: Self-pay | Admitting: Medical Oncology

## 2018-02-26 ENCOUNTER — Other Ambulatory Visit: Payer: Self-pay | Admitting: Medical Oncology

## 2018-02-26 DIAGNOSIS — C7931 Secondary malignant neoplasm of brain: Secondary | ICD-10-CM | POA: Diagnosis not present

## 2018-02-26 DIAGNOSIS — C3490 Malignant neoplasm of unspecified part of unspecified bronchus or lung: Secondary | ICD-10-CM | POA: Diagnosis not present

## 2018-02-26 DIAGNOSIS — E1159 Type 2 diabetes mellitus with other circulatory complications: Secondary | ICD-10-CM | POA: Diagnosis not present

## 2018-02-26 DIAGNOSIS — C787 Secondary malignant neoplasm of liver and intrahepatic bile duct: Secondary | ICD-10-CM | POA: Diagnosis not present

## 2018-02-26 DIAGNOSIS — C7951 Secondary malignant neoplasm of bone: Secondary | ICD-10-CM | POA: Diagnosis not present

## 2018-02-26 DIAGNOSIS — E43 Unspecified severe protein-calorie malnutrition: Secondary | ICD-10-CM | POA: Diagnosis not present

## 2018-02-26 NOTE — Progress Notes (Signed)
FMLA for daughter, Roland Earl, successfully faxed to [employer] at [phone]. Mailed copy to [employer] at [place] [place], [place] [address].

## 2018-02-26 NOTE — Telephone Encounter (Addendum)
requests Diflucan. Hospice RN >New problem for pt -"vaginal yeast infection "- .  Marland Kitchen Was on iv and oral antibiotic at home for a few days. Please send Dilfucan to her preferred pharmacy-[facility] [address]

## 2018-02-27 ENCOUNTER — Other Ambulatory Visit: Payer: Self-pay | Admitting: Internal Medicine

## 2018-02-27 MED ORDER — FLUCONAZOLE 150 MG PO TABS: 150.0000 mg | ORAL_TABLET | Freq: Every day | ORAL | 0 refills | Status: AC

## 2018-03-01 DIAGNOSIS — C787 Secondary malignant neoplasm of liver and intrahepatic bile duct: Secondary | ICD-10-CM | POA: Diagnosis not present

## 2018-03-01 DIAGNOSIS — E1159 Type 2 diabetes mellitus with other circulatory complications: Secondary | ICD-10-CM | POA: Diagnosis not present

## 2018-03-01 DIAGNOSIS — C7951 Secondary malignant neoplasm of bone: Secondary | ICD-10-CM | POA: Diagnosis not present

## 2018-03-01 DIAGNOSIS — C3490 Malignant neoplasm of unspecified part of unspecified bronchus or lung: Secondary | ICD-10-CM | POA: Diagnosis not present

## 2018-03-01 DIAGNOSIS — E43 Unspecified severe protein-calorie malnutrition: Secondary | ICD-10-CM | POA: Diagnosis not present

## 2018-03-01 DIAGNOSIS — C7931 Secondary malignant neoplasm of brain: Secondary | ICD-10-CM | POA: Diagnosis not present

## 2018-03-02 DIAGNOSIS — C7951 Secondary malignant neoplasm of bone: Secondary | ICD-10-CM | POA: Diagnosis not present

## 2018-03-02 DIAGNOSIS — C3490 Malignant neoplasm of unspecified part of unspecified bronchus or lung: Secondary | ICD-10-CM | POA: Diagnosis not present

## 2018-03-02 DIAGNOSIS — E43 Unspecified severe protein-calorie malnutrition: Secondary | ICD-10-CM | POA: Diagnosis not present

## 2018-03-02 DIAGNOSIS — E1159 Type 2 diabetes mellitus with other circulatory complications: Secondary | ICD-10-CM | POA: Diagnosis not present

## 2018-03-02 DIAGNOSIS — C787 Secondary malignant neoplasm of liver and intrahepatic bile duct: Secondary | ICD-10-CM | POA: Diagnosis not present

## 2018-03-02 DIAGNOSIS — C7931 Secondary malignant neoplasm of brain: Secondary | ICD-10-CM | POA: Diagnosis not present

## 2018-03-03 DIAGNOSIS — C7951 Secondary malignant neoplasm of bone: Secondary | ICD-10-CM | POA: Diagnosis not present

## 2018-03-03 DIAGNOSIS — C787 Secondary malignant neoplasm of liver and intrahepatic bile duct: Secondary | ICD-10-CM | POA: Diagnosis not present

## 2018-03-03 DIAGNOSIS — C3490 Malignant neoplasm of unspecified part of unspecified bronchus or lung: Secondary | ICD-10-CM | POA: Diagnosis not present

## 2018-03-03 DIAGNOSIS — C7931 Secondary malignant neoplasm of brain: Secondary | ICD-10-CM | POA: Diagnosis not present

## 2018-03-03 DIAGNOSIS — E43 Unspecified severe protein-calorie malnutrition: Secondary | ICD-10-CM | POA: Diagnosis not present

## 2018-03-03 DIAGNOSIS — E1159 Type 2 diabetes mellitus with other circulatory complications: Secondary | ICD-10-CM | POA: Diagnosis not present

## 2018-03-04 ENCOUNTER — Telehealth: Payer: Self-pay | Admitting: Medical Oncology

## 2018-03-04 DIAGNOSIS — C7931 Secondary malignant neoplasm of brain: Secondary | ICD-10-CM | POA: Diagnosis not present

## 2018-03-04 DIAGNOSIS — C3490 Malignant neoplasm of unspecified part of unspecified bronchus or lung: Secondary | ICD-10-CM | POA: Diagnosis not present

## 2018-03-04 DIAGNOSIS — E1159 Type 2 diabetes mellitus with other circulatory complications: Secondary | ICD-10-CM | POA: Diagnosis not present

## 2018-03-04 DIAGNOSIS — C7951 Secondary malignant neoplasm of bone: Secondary | ICD-10-CM | POA: Diagnosis not present

## 2018-03-04 DIAGNOSIS — C787 Secondary malignant neoplasm of liver and intrahepatic bile duct: Secondary | ICD-10-CM | POA: Diagnosis not present

## 2018-03-04 DIAGNOSIS — E43 Unspecified severe protein-calorie malnutrition: Secondary | ICD-10-CM | POA: Diagnosis not present

## 2018-03-04 NOTE — Telephone Encounter (Signed)
Pt died today at 35.

## 2018-03-08 DEATH — deceased

## 2018-03-12 ENCOUNTER — Ambulatory Visit: Payer: Medicare Other | Admitting: Oncology

## 2018-03-12 ENCOUNTER — Other Ambulatory Visit: Payer: Medicare Other

## 2018-03-12 ENCOUNTER — Ambulatory Visit: Payer: Medicare Other

## 2018-03-25 ENCOUNTER — Other Ambulatory Visit: Payer: Medicare Other

## 2018-03-25 ENCOUNTER — Ambulatory Visit: Payer: Medicare Other | Admitting: Oncology

## 2018-03-25 ENCOUNTER — Encounter: Payer: Medicare Other | Admitting: Nutrition

## 2018-03-25 ENCOUNTER — Ambulatory Visit: Payer: Medicare Other

## 2019-10-19 IMAGING — PT NM PET TUM IMG INITIAL (PI) SKULL BASE T - THIGH
10 series · 19 of 25 positions shown · non-contrast
Comparison: CT chest 06/30/2017

CLINICAL DATA: Initial treatment strategy for pulmonary nodule.

EXAM:
NUCLEAR MEDICINE PET SKULL BASE TO THIGH
TECHNIQUE: 5.8 mCi F-18 FDG was injected intravenously. Full-ring PET imaging
was performed from the skull base to thigh after the radiotracer. CT
data was obtained and used for attenuation correction and anatomic
localization.
Fasting blood glucose: 73 mg/dl

[Series 3: ct wb 5.0 b30f · axial · 5.0mm · 0.98mm/px · z∈[-1404,-1029]mm · 2 of 251 slices shown]
[im 1/251]
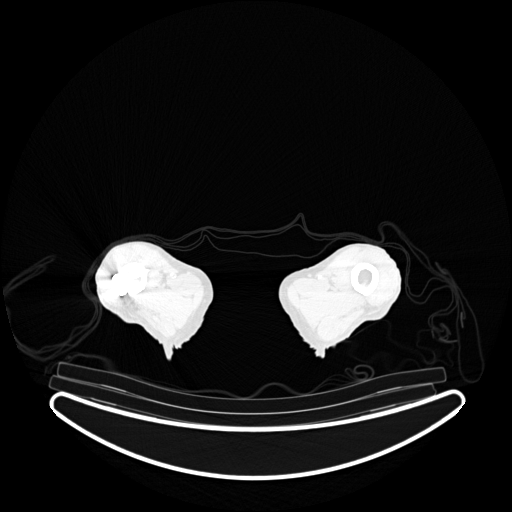
[im 126/251]
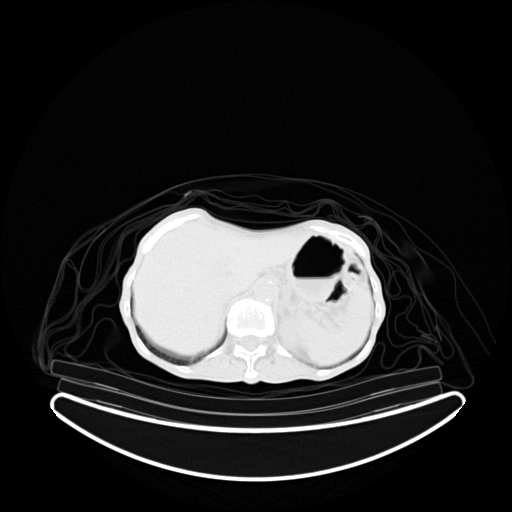

[Series 4: pet wb (ac) · axial · 5.0mm · 2.72mm/px · z∈[-1404,-654]mm · 3 of 251 slices shown]
[im 1/251]
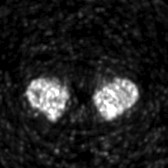
[im 126/251]
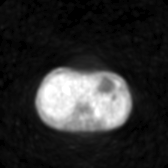
[im 251/251]
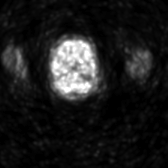

[Series 5: pet wb uncorrected (nac) · axial · 5.0mm · 4.07mm/px · z∈[-1029,-654]mm · 2 of 251 slices shown]
[im 126/251]
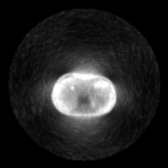
[im 251/251]
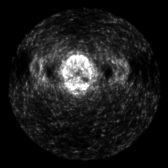

[Series 603: pet axial fused · 3 of 248 slices shown]
[im 1/248]
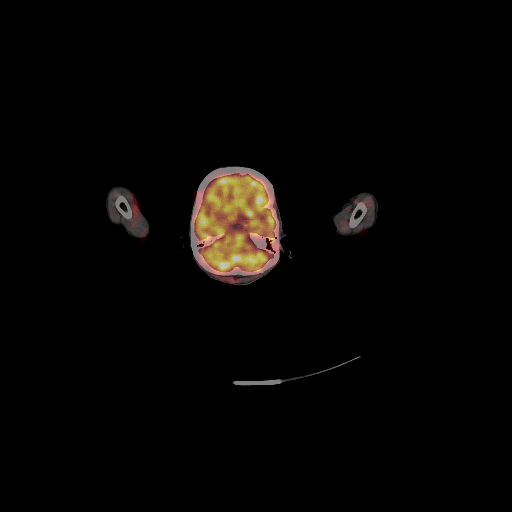
[im 165/248]
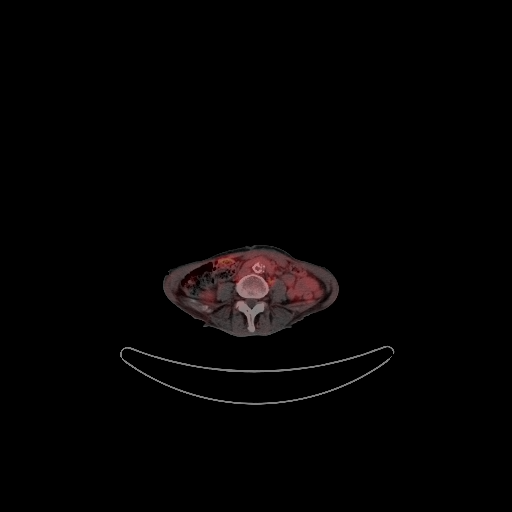
[im 248/248]
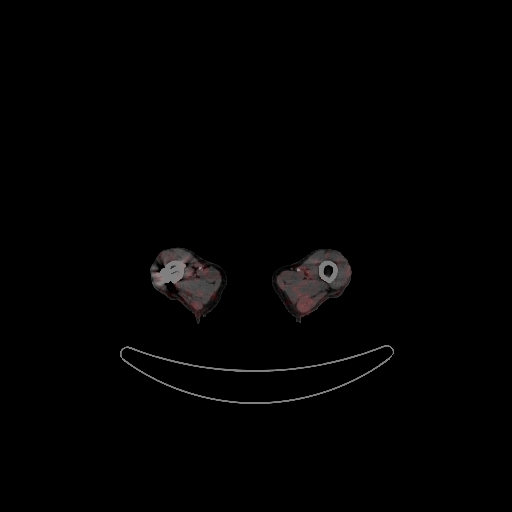

[Series 604: pet coronal fused · 1 of 48 slices shown]
[im 1/48]
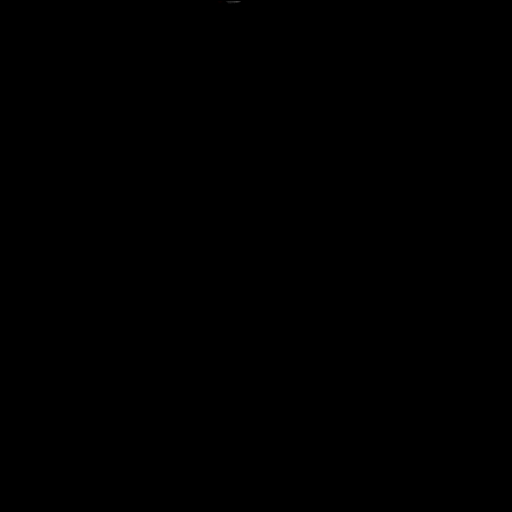

[Series 605: pet sagittal fused · 1 of 124 slices shown]
[im 124/124]
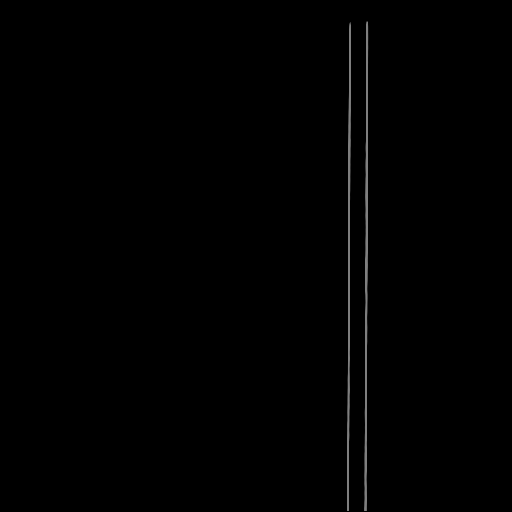

[Series 606: pet axial · 3 of 250 slices shown]
[im 1/250]
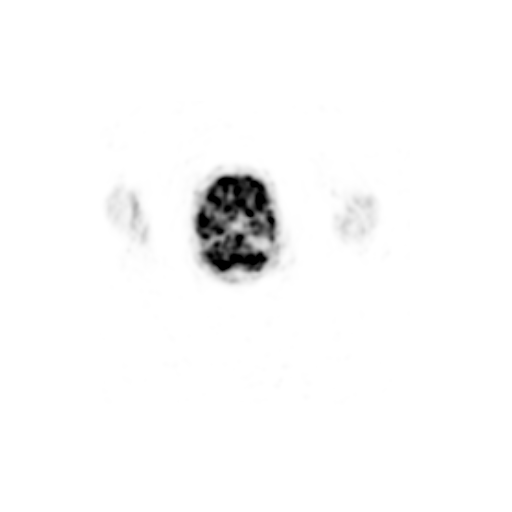
[im 84/250]
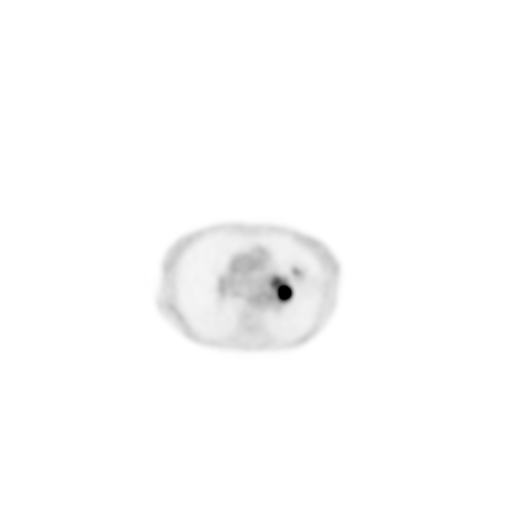
[im 250/250]
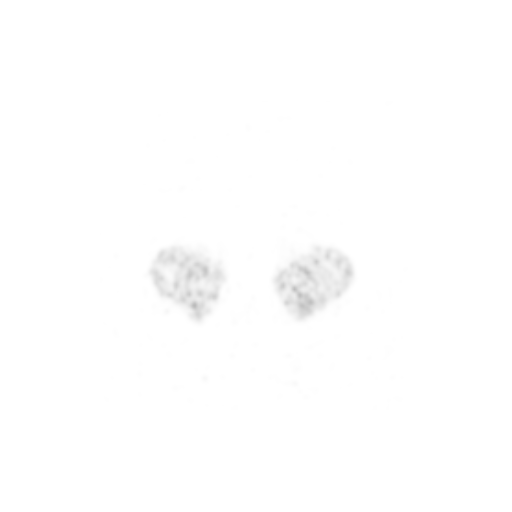

[Series 607: pet coronal · 2 of 104 slices shown]
[im 1/104]
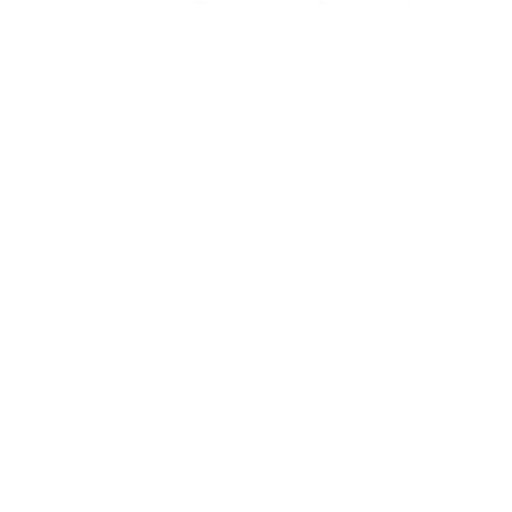
[im 104/104]
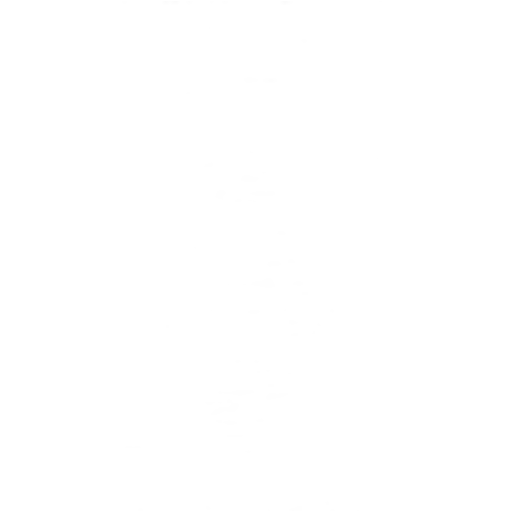

[Series 608: pet sagittal · 1 of 136 slices shown]
[im 136/136]
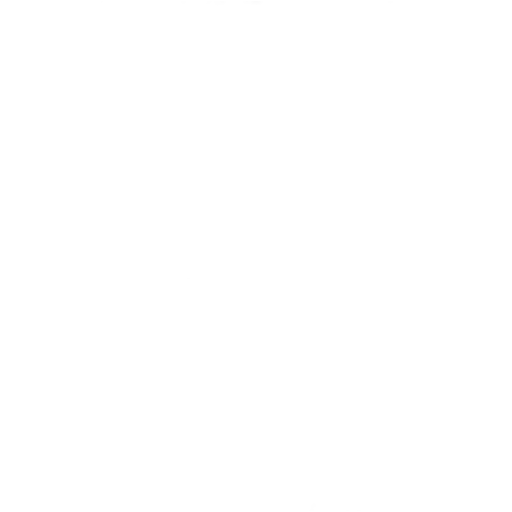

[Series 1066: results mm oncology reading · 1.0mm · 0.50mm/px · 1 of 7 slices shown]
[im 1/7]
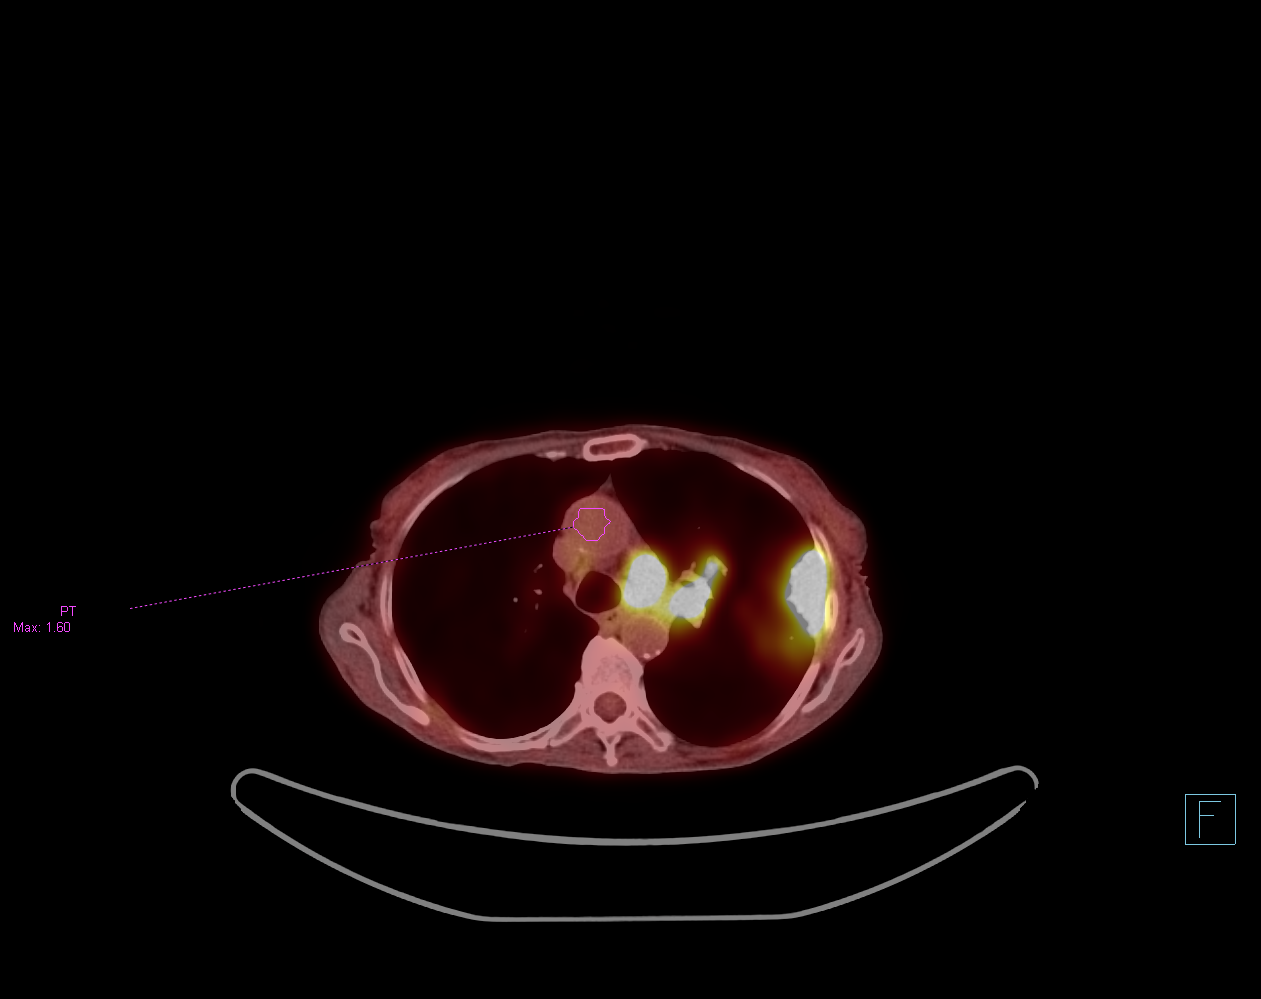

[19 of 25 positions shown; findings below may reference images not displayed]

FINDINGS: Mediastinal blood pool activity: SUV max

NECK: No hypermetabolic lymph nodes in the neck.

Incidental CT findings: None

CHEST: Enlarged and hypermetabolic left-sided mediastinal and hilar
lymph nodes. Index left AP window lymph node measures 1.7 cm and has
an SUV max equal to 11.48. Left hilar nodal mass measures 4.4 cm and
has an SUV max equal to 11.18. Within the periphery of the left
upper lobe there is a subpleural mass measuring 4.2 cm within SUV
max equal to 10.8.

Incidental CT findings: Aortic atherosclerosis. Calcifications
within the LAD, left circumflex and RCA coronary arteries.

ABDOMEN/PELVIS: 1.4 cm lesion within segment 4 of the liver measures
1.4 cm and has an SUV max equal to 5.0. No additional hypermetabolic
liver lesions. No abnormal uptake within the adrenal glands,
pancreas or spleen.

Incidental CT findings: Aortic atherosclerosis. Infrarenal abdominal
aortic ectasia measures 2.8 cm, image 159/3. Nonobstructing left
renal calculus measures 7 mm.

SKELETON: Hypermetabolic lesions within the central sacrum has an
SUV max equal to 5.79. A second hypermetabolic lesion within the
posterior column of the right acetabulum has an SUV max equal to
5.2.

Incidental CT findings: No suspicious lytic or sclerotic bone
lesions identified on the corresponding CT images. Chronic healed
right inferior pubic rami fracture noted.
IMPRESSION: 1. Subpleural mass within the left upper lobe is intensely
hypermetabolic compatible with primary bronchogenic carcinoma. There
is associated ipsilateral hilar and mediastinal nodal metastasis,
liver metastasis and bone metastasis.
2.  Aortic Atherosclerosis (2998Z-AWG.G).
3. Ectatic abdominal aorta at risk for aneurysm development.
Recommend followup by ultrasound in 5 years. This recommendation
follows ACR consensus guidelines: White Paper of the ACR Incidental
Findings Committee II on Vascular Findings. [HOSPITAL] 8918;
4. Three vessel coronary artery calcifications noted.

## 2019-10-20 IMAGING — US IR FLUORO GUIDE CV LINE*R*
1 series · 1 of 1 positions shown · non-contrast
Comparison: none

INDICATION: 76-year-old female with a history of lung carcinoma

[Series 1: ir fluoro guide cv line*right* · 1 of 1 slices shown]
[im 1/1]
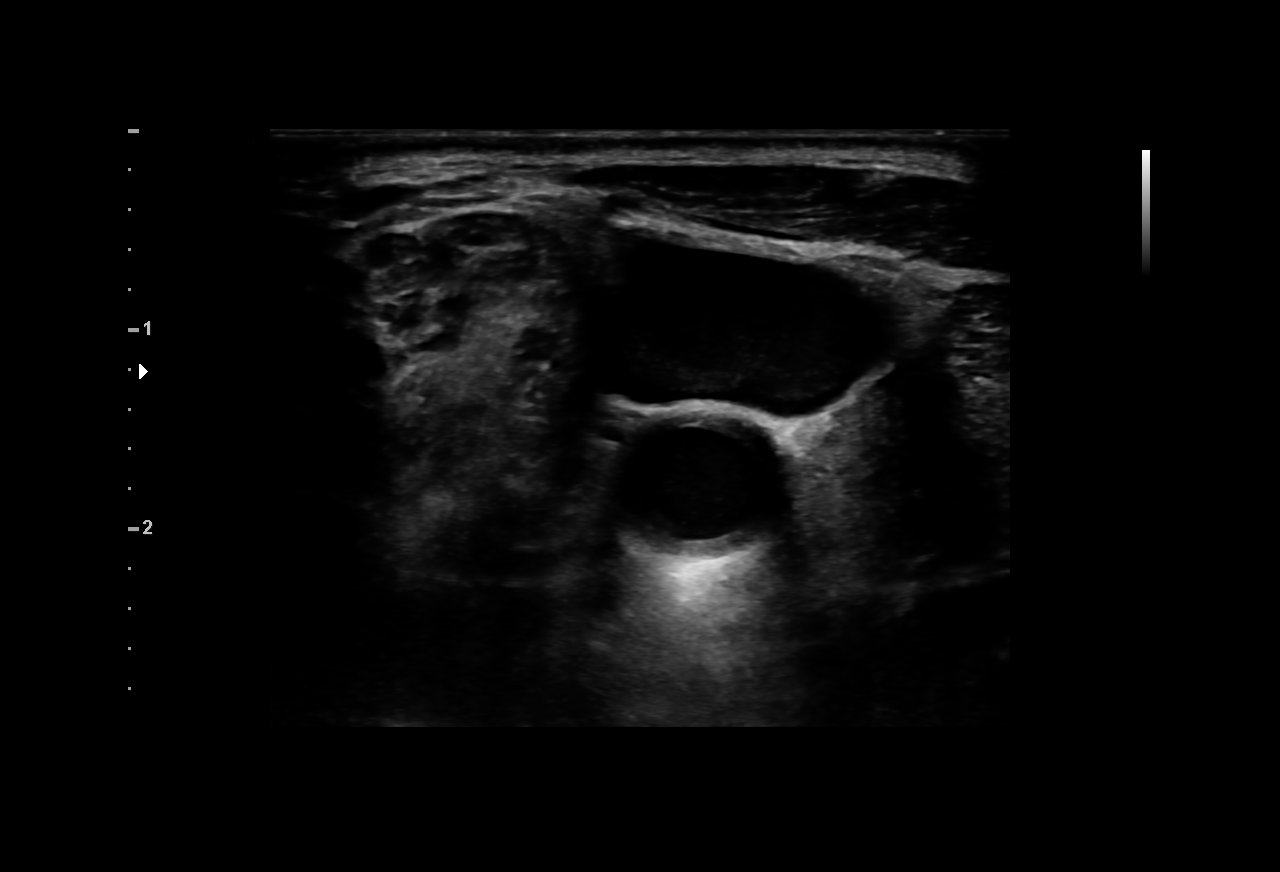

[1 of 1 positions shown; findings below may reference images not displayed]

EXAM:
IMPLANTED PORT A CATH PLACEMENT WITH ULTRASOUND AND FLUOROSCOPIC
GUIDANCE

MEDICATIONS:
2.0 g Ancef; The antibiotic was administered within an appropriate
time interval prior to skin puncture.

ANESTHESIA/SEDATION:
Moderate (conscious) sedation was employed during this procedure. A
total of Versed 2.0 mg and Fentanyl 100 mcg was administered
intravenously.

Moderate Sedation Time: 21 minutes. The patient's level of
consciousness and vital signs were monitored continuously by
radiology nursing throughout the procedure under my direct
supervision.

FLUOROSCOPY TIME:  0 minutes, 6 seconds (1 mGy)

COMPLICATIONS:
None

PROCEDURE:
The procedure, risks, benefits, and alternatives were explained to
the patient. Questions regarding the procedure were encouraged and
answered. The patient understands and consents to the procedure.

Ultrasound survey was performed with images stored and sent to PACs.

The right neck and chest was prepped with chlorhexidine, and draped
in the usual sterile fashion using maximum barrier technique (cap
and mask, sterile gown, sterile gloves, large sterile sheet, hand
hygiene and cutaneous antiseptic). Antibiotic prophylaxis was
provided with 2.0g Ancef administered IV one hour prior to skin
incision. Local anesthesia was attained by infiltration with 1%
lidocaine without epinephrine.

Ultrasound demonstrated patency of the right internal jugular vein,
and this was documented with an image. Under real-time ultrasound
guidance, this vein was accessed with a 21 gauge micropuncture
needle and image documentation was performed. A small dermatotomy
was made at the access site with an 11 scalpel. A 0.018" wire was
advanced into the SVC and used to estimate the length of the
internal catheter. The access needle exchanged for a 4F
micropuncture vascular sheath. The 0.018" wire was then removed and
a 0.035" wire advanced into the IVC.



The venous access site was then serially dilated and a peel away
vascular sheath placed over the wire. The wire was removed and the
port catheter advanced into position under fluoroscopic guidance.
The catheter tip is positioned in the cavoatrial junction. This was
documented with a spot image. The portacatheter was then tested and
found to flush and aspirate well. The port was flushed with saline
followed by 100 units/mL heparinized saline.

The pocket was then closed in two layers using first subdermal
inverted interrupted absorbable sutures followed by a running
subcuticular suture. The epidermis was then sealed with Dermabond.
The dermatotomy at the venous access site was also seal with
Dermabond.

Patient tolerated the procedure well and remained hemodynamically
stable throughout.

No complications encountered and no significant blood loss
encountered
IMPRESSION: Status post right IJ port catheter placement. Catheter ready for
use.

## 2019-11-16 IMAGING — CR DG CHEST 2V
2 series · 2 of 2 positions shown · non-contrast
Comparison: Chest radiograph dated 08/13/2017

CLINICAL DATA: 76-year-old female with weakness.

EXAM:
CHEST - 2 VIEW

[w chest lat]
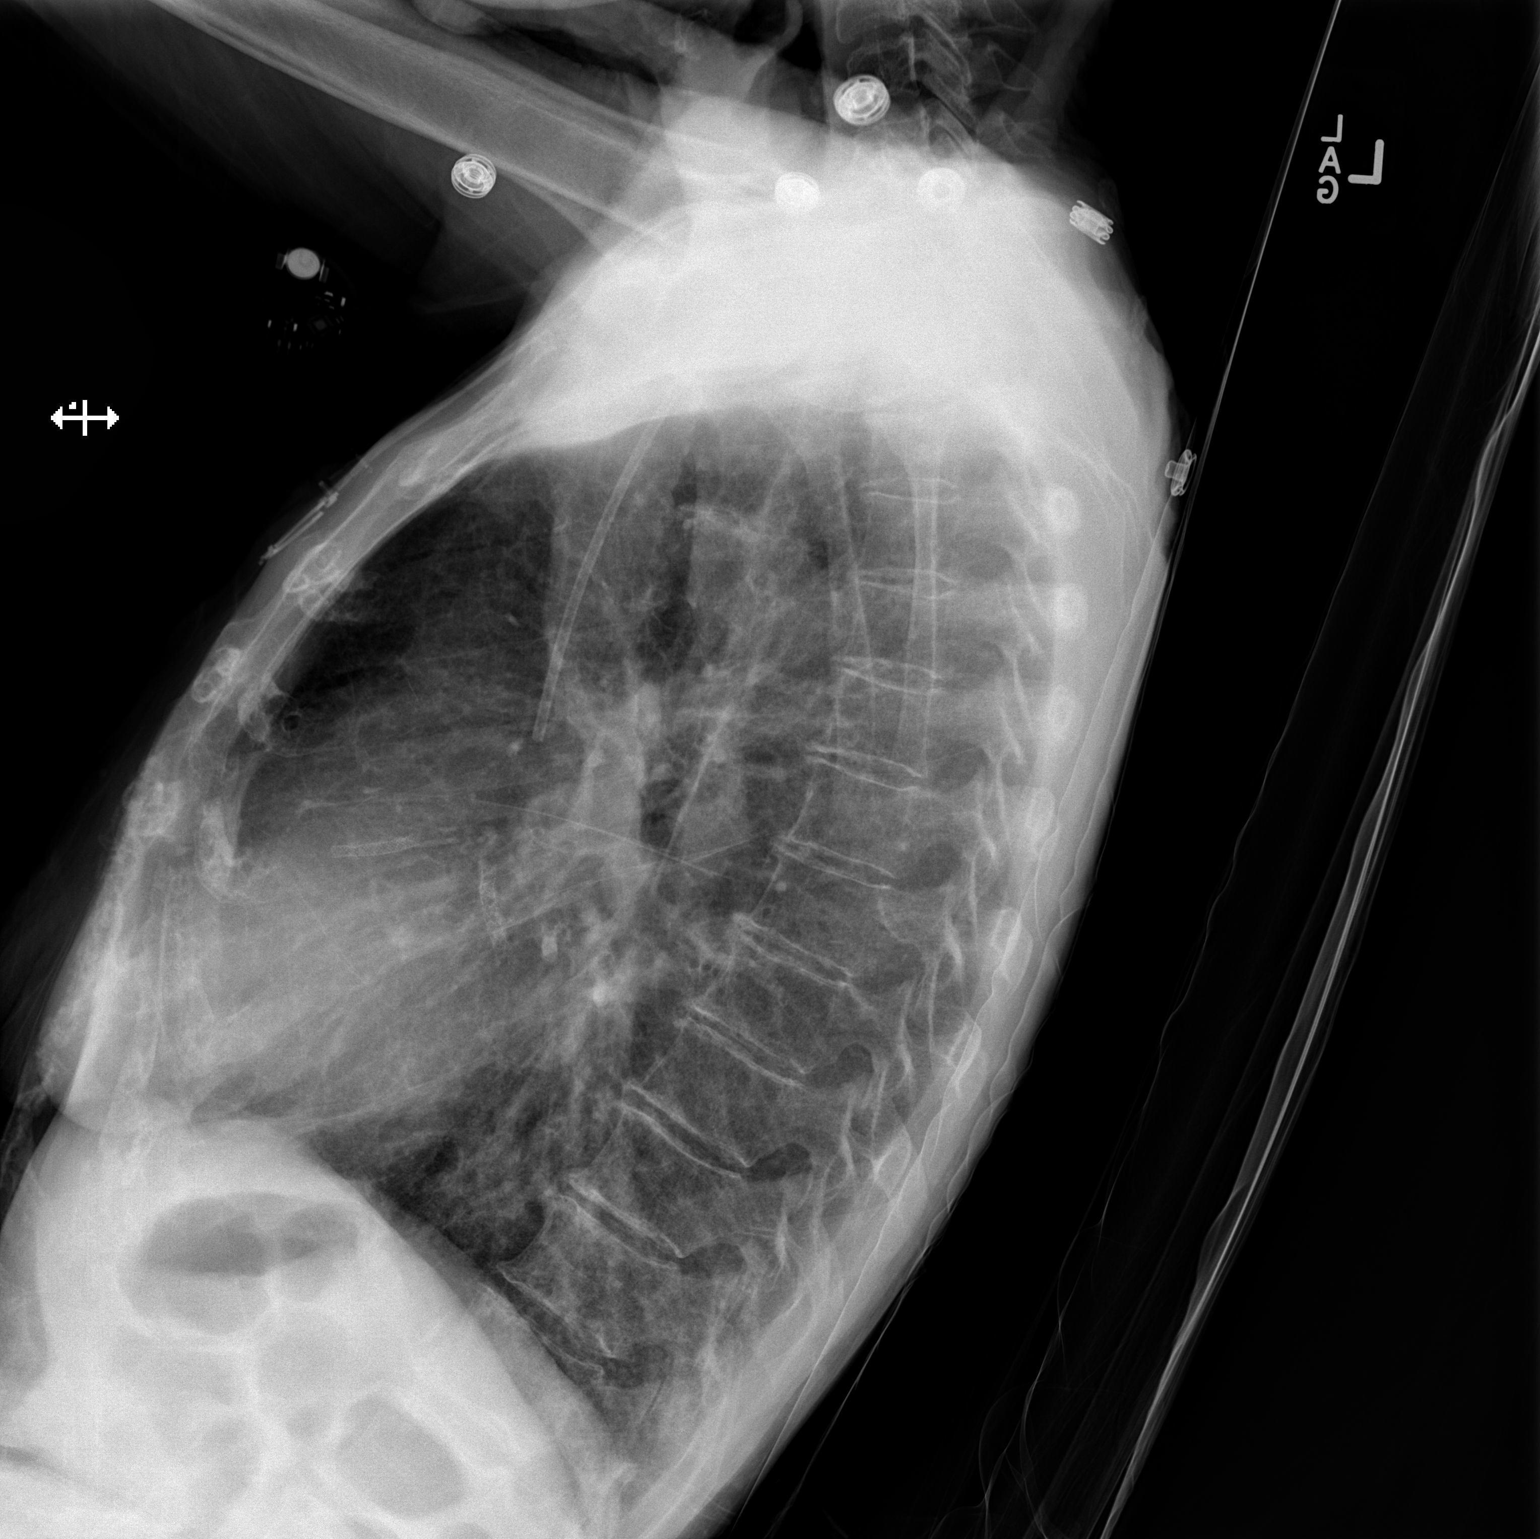

[x chest ap]
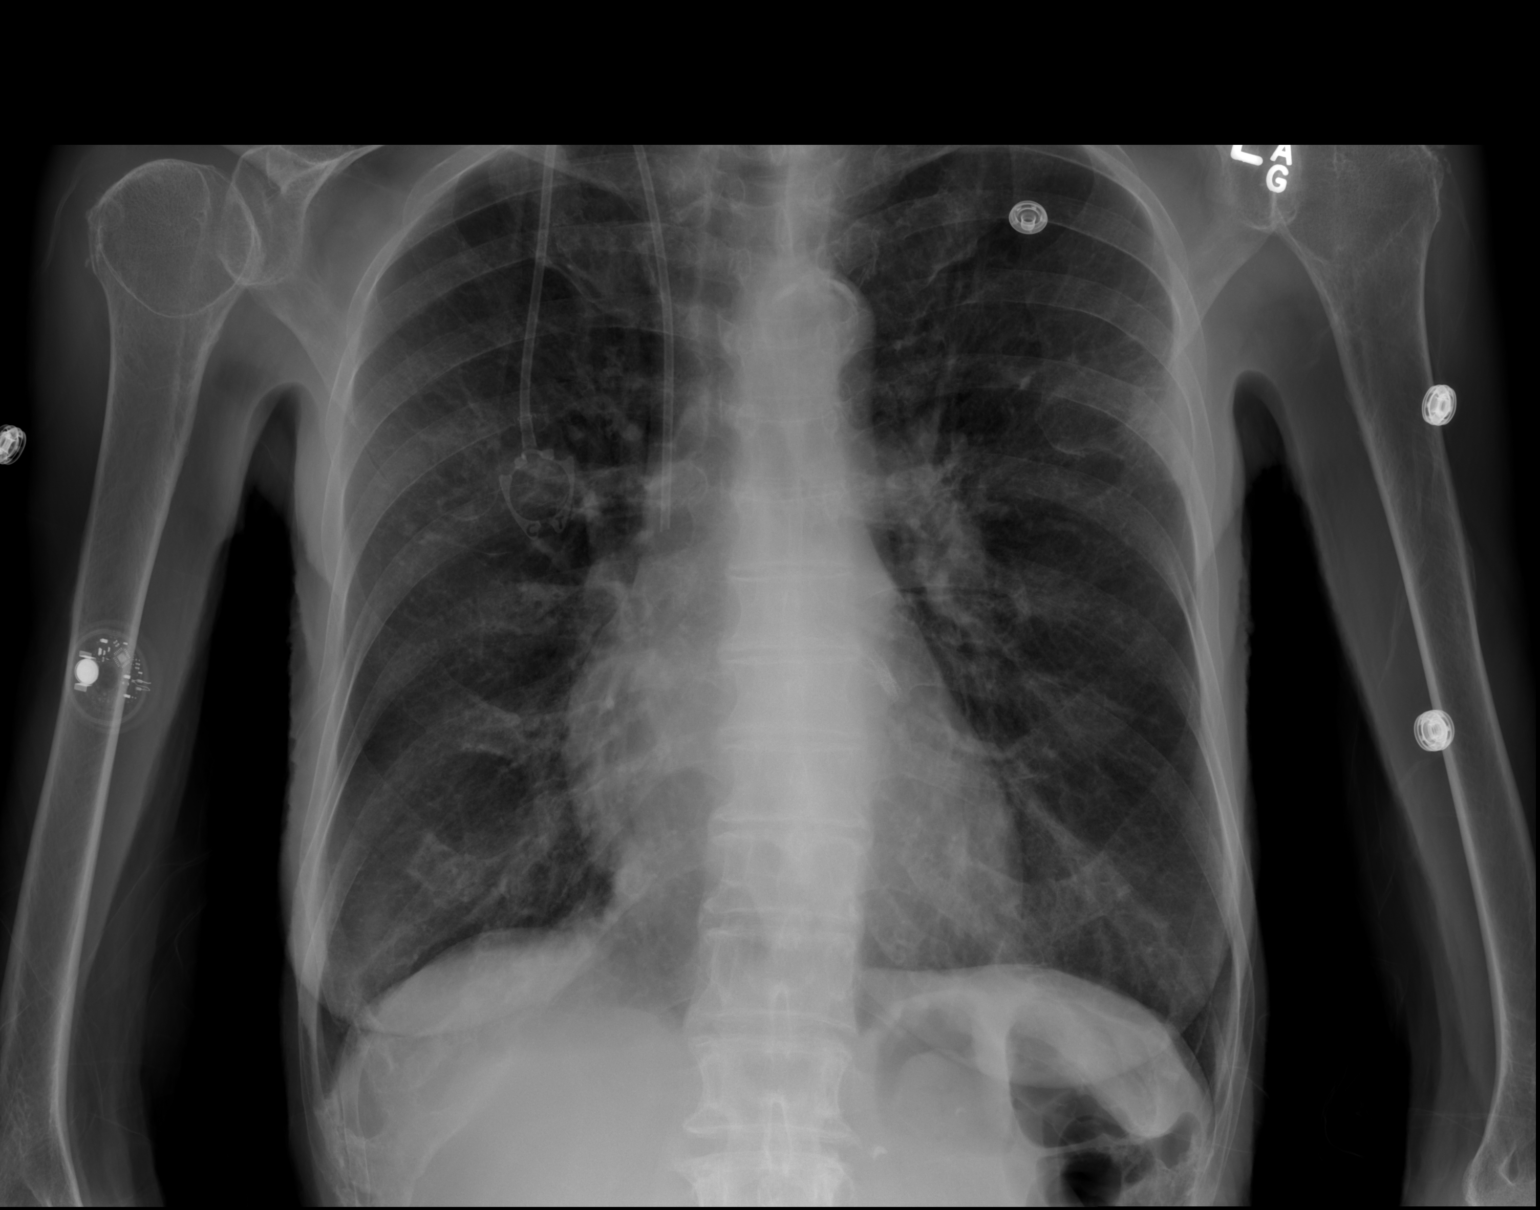

[2 of 2 positions shown; findings below may reference images not displayed]

FINDINGS: Right pectoral Port-A-Cath with tip at the cavoatrial junction.
Diffuse interstitial coarsening and bronchitic changes. There is no
focal consolidation, pleural effusion, or pneumothorax. The cardiac
silhouette is within normal limits. Coronary stents noted. There is
osteopenia with degenerative changes of the spine. No acute osseous
pathology.
IMPRESSION: No active cardiopulmonary disease.
